# Patient Record
Sex: Male | Born: 1935 | Race: White | Hispanic: No | Marital: Married | Smoking: Former smoker
Health system: Southern US, Community
[De-identification: ages and names within clinical notes are randomized; demographics above are authoritative.]

## PROBLEM LIST (undated history)

## (undated) DIAGNOSIS — I10 Essential (primary) hypertension: Secondary | ICD-10-CM

## (undated) DIAGNOSIS — R05 Cough: Secondary | ICD-10-CM

## (undated) DIAGNOSIS — C7951 Secondary malignant neoplasm of bone: Secondary | ICD-10-CM

## (undated) DIAGNOSIS — R0602 Shortness of breath: Secondary | ICD-10-CM

## (undated) DIAGNOSIS — I350 Nonrheumatic aortic (valve) stenosis: Secondary | ICD-10-CM

## (undated) DIAGNOSIS — E785 Hyperlipidemia, unspecified: Secondary | ICD-10-CM

## (undated) DIAGNOSIS — Z7969 Long term (current) use of other immunomodulators and immunosuppressants: Secondary | ICD-10-CM

## (undated) DIAGNOSIS — I639 Cerebral infarction, unspecified: Secondary | ICD-10-CM

## (undated) DIAGNOSIS — Z789 Other specified health status: Secondary | ICD-10-CM

## (undated) DIAGNOSIS — Z79899 Other long term (current) drug therapy: Secondary | ICD-10-CM

## (undated) DIAGNOSIS — K649 Unspecified hemorrhoids: Secondary | ICD-10-CM

## (undated) DIAGNOSIS — J449 Chronic obstructive pulmonary disease, unspecified: Secondary | ICD-10-CM

## (undated) DIAGNOSIS — Z720 Tobacco use: Secondary | ICD-10-CM

## (undated) DIAGNOSIS — R059 Cough, unspecified: Secondary | ICD-10-CM

## (undated) DIAGNOSIS — Z9181 History of falling: Secondary | ICD-10-CM

## (undated) DIAGNOSIS — Z9221 Personal history of antineoplastic chemotherapy: Secondary | ICD-10-CM

## (undated) DIAGNOSIS — C3490 Malignant neoplasm of unspecified part of unspecified bronchus or lung: Principal | ICD-10-CM

## (undated) DIAGNOSIS — H348192 Central retinal vein occlusion, unspecified eye, stable: Secondary | ICD-10-CM

## (undated) DIAGNOSIS — H409 Unspecified glaucoma: Secondary | ICD-10-CM

## (undated) DIAGNOSIS — K2970 Gastritis, unspecified, without bleeding: Secondary | ICD-10-CM

## (undated) DIAGNOSIS — Z4682 Encounter for fitting and adjustment of non-vascular catheter: Secondary | ICD-10-CM

## (undated) DIAGNOSIS — Z923 Personal history of irradiation: Secondary | ICD-10-CM

## (undated) DIAGNOSIS — J939 Pneumothorax, unspecified: Secondary | ICD-10-CM

## (undated) DIAGNOSIS — Z9849 Cataract extraction status, unspecified eye: Secondary | ICD-10-CM

## (undated) DIAGNOSIS — K219 Gastro-esophageal reflux disease without esophagitis: Secondary | ICD-10-CM

## (undated) DIAGNOSIS — S2231XA Fracture of one rib, right side, initial encounter for closed fracture: Secondary | ICD-10-CM

## (undated) DIAGNOSIS — R918 Other nonspecific abnormal finding of lung field: Secondary | ICD-10-CM

## (undated) DIAGNOSIS — K759 Inflammatory liver disease, unspecified: Secondary | ICD-10-CM

## (undated) DIAGNOSIS — R531 Weakness: Secondary | ICD-10-CM

## (undated) DIAGNOSIS — J40 Bronchitis, not specified as acute or chronic: Secondary | ICD-10-CM

## (undated) HISTORY — DX: Cataract extraction status, unspecified eye: Z98.49

## (undated) HISTORY — DX: Gastritis, unspecified, without bleeding: K29.70

## (undated) HISTORY — DX: Essential (primary) hypertension: I10

## (undated) HISTORY — PX: TONSILLECTOMY: SUR1361

## (undated) HISTORY — DX: Nonrheumatic aortic (valve) stenosis: I35.0

## (undated) HISTORY — DX: Tobacco use: Z72.0

## (undated) HISTORY — DX: Hyperlipidemia, unspecified: E78.5

## (undated) HISTORY — DX: Bronchitis, not specified as acute or chronic: J40

## (undated) HISTORY — PX: EYE SURGERY: SHX253

## (undated) HISTORY — DX: Chronic obstructive pulmonary disease, unspecified: J44.9

## (undated) HISTORY — DX: Central retinal vein occlusion, unspecified eye, stable: H34.8192

## (undated) HISTORY — PX: VASECTOMY: SHX75

## (undated) HISTORY — DX: Malignant neoplasm of unspecified part of unspecified bronchus or lung: C34.90

## (undated) HISTORY — DX: Cerebral infarction, unspecified: I63.9

## (undated) HISTORY — DX: Inflammatory liver disease, unspecified: K75.9

## (undated) HISTORY — PX: TONSILLECTOMY: SHX5217

## (undated) HISTORY — PX: OTHER SURGICAL HISTORY: SHX169

## (undated) HISTORY — PX: LEG SURGERY: SHX1003

---

## 1988-03-09 DIAGNOSIS — H348192 Central retinal vein occlusion, unspecified eye, stable: Secondary | ICD-10-CM

## 1988-03-09 HISTORY — DX: Central retinal vein occlusion, unspecified eye, stable: H34.8192

## 2000-03-09 DIAGNOSIS — I639 Cerebral infarction, unspecified: Secondary | ICD-10-CM

## 2000-03-09 HISTORY — DX: Cerebral infarction, unspecified: I63.9

## 2001-09-15 ENCOUNTER — Encounter: Payer: Self-pay | Admitting: Plastic Surgery

## 2001-09-15 ENCOUNTER — Encounter: Admission: RE | Admit: 2001-09-15 | Discharge: 2001-09-15 | Payer: Self-pay | Admitting: Plastic Surgery

## 2001-09-16 ENCOUNTER — Ambulatory Visit (HOSPITAL_BASED_OUTPATIENT_CLINIC_OR_DEPARTMENT_OTHER): Admission: RE | Admit: 2001-09-16 | Discharge: 2001-09-16 | Payer: Self-pay | Admitting: Plastic Surgery

## 2004-11-18 ENCOUNTER — Encounter: Admission: RE | Admit: 2004-11-18 | Discharge: 2004-11-18 | Payer: Self-pay | Admitting: Endocrinology

## 2008-06-15 ENCOUNTER — Encounter: Admission: RE | Admit: 2008-06-15 | Discharge: 2008-06-15 | Payer: Self-pay | Admitting: Endocrinology

## 2009-05-06 ENCOUNTER — Encounter: Admission: RE | Admit: 2009-05-06 | Discharge: 2009-05-06 | Payer: Self-pay | Admitting: Endocrinology

## 2010-07-25 NOTE — Op Note (Signed)
[place]. [facility]  Patient:    Joel Gibson, Joel Gibson Visit Number: [id number] MRN: [id number]          Service Type: DSU Location: [facility] Attending Physician:  Loura Halt Ii Dictated by:   Alfredia Ferguson, M.D. Proc. Date: 09/16/01 Admit Date:  09/16/2001 Discharge Date: 09/16/2001                             Operative Report  PREOPERATIVE DIAGNOSIS: 1. Bilateral upper eyelid dermatochalasis. 2. Bilateral eyebrow ptosis. 3. Visual field deficit secondary to #1 and 2.  POSTOPERATIVE DIAGNOSIS: 1. Bilateral upper eyelid dermatochalasis. 2. Bilateral eyebrow ptosis. 3. Visual field deficit secondary to #1 and 2.  OPERATION PERFORMED: 1. Bilateral midforehead brow lift. 2. Bilateral upper eyelid blepharoplasty.  SURGEON:  Alfredia Ferguson, M.D.  ANESTHESIA:  MAC supplemented with 1% Xylocaine 1:100,000 epinephrine.  INDICATIONS FOR PROCEDURE:  The patient is a 77 year old gentleman who has a marked visual field deficit secondary to bilateral upper eyelid dermatochalasis and brow ptosis.  The patient wishes to undergo upper eyelid blepharoplasty and elevation of the eyebrow.  The plan is to elevate his eyebrow through a midforehead browlift.  The potential risks of overelevation of the eyebrow, underelevation of the eyebrow, asymmetry of the position of the eyebrow, infection, bleeding, unsightly scarring, numbness of the forehead and overall dissatisfaction with the results were discussed.  The potential risks of upper eyelid blepharoplasty include blindness, overresection of skin, underresection of skin, asymmetry, unsightly scarring, the need for secondary surgery, the need to return to the operating room for bleeding problems and overall dissatisfaction with the results.  Despite these and other risks discussed with the patient, he wished to proceed with the planned procedure.  DESCRIPTION OF PROCEDURE:  With the patient in the sitting  position, skin markers were placed outlining elliptical excision of forehead skin above each eyebrow in approximately the midforehead area.  The excision will be through a fairly deep forehead rhytid.  Skin markers were placed outlining the dimensions of an elliptical excision of skin from both upper eyelids.  The position of the incision in the eyes was 6 mm above the lid margin.  The patient was taken to the operating room where he was given adequate IV sedation.  Local anesthesia was infiltrated first in the eyebrow area and then in the upper eyelid area.  The face was prepped and draped in a sterile fashion.  After waiting approximately 10 minutes, attention was first directed to the right midforehead area.  An elliptical excision of skin approximately 5 cm in length and approximately 1 cm in width was carried out.  The depth of the excision of the skin was down to the frontalis muscle.  Three stab incisions were then made just at the hairline of the eyebrow on the right. One was at the medial brow, one was in the middle brow and one was at the lateral brow.  After achieving hemostasis, a 4-0 Prolene suture was placed in the upper skin incision medially in the subcutaneous space.  The suture was then tunneled beneath the skin and brought out through the stab incision in the medial incision of the right brow.  Once the needle had been brought out, it was then placed back in the stab incision and a bite of subcutaneous tissue was taken and the needle was passed back underneath the skin up to the eyebrow incision.  This was repeated  for the middle and lateral incision.  After irrigating the wound and ensuring hemostasis, these three Prolene sutures were now tied, suspending the eyebrow in the desired height.  Once these sutures were tied, the dermis of the incision was closed with multiple interrupted 4-0 Vicryl sutures.  Steri-Strips were applied to the skin edges.  Attention was directed  to the left midforehead wound.  Identical procedure was performed. Upon completion of the left side, symmetry appeared to be acceptable. Attention was now directed to the right upper eyelid where an elliptical excision of skin and muscle was carried out.  The skin was passed off.  The septum orbitale was opened medially and a small amount of fat was removed from the medial pocket.  Hemostasis was meticulously carried.  The incision was irrigated and inspected for hemostasis and once assured, a cold pack was placed over the open wound and attention was directed to the left upper eyelid where an identical procedure was performed.  Prior to excising the skin on both sides, the skin was tested to ensure that the correct amount of skin was being removed and not overresected.  A cold sponge was placed over the left incision and attention was redirected to the right upper eyelid.  Once hemostasis had been assured, the incision was closed using a running 6-0 nylon suture.  Closure on the left side was in identical fashion.  Symmetry appeared to be good.  The patient did not have any lagophthalmus at the completion of the procedure.  Lacrilube was placed in the eyes for lubrication.  The patients face was cleansed and dried.  The patient was taken to the recovery room in satisfactory condition.  Estimated blood loss for this procedure was less than 5 cc. Dictated by:   Alfredia Ferguson, M.D. Attending Physician:  Loura Halt Ii DD:  09/16/01 TD:  09/19/01 Job: [id number]

## 2010-09-03 ENCOUNTER — Encounter: Payer: Self-pay | Admitting: Cardiology

## 2010-09-11 ENCOUNTER — Encounter: Payer: Self-pay | Admitting: Cardiology

## 2010-09-11 ENCOUNTER — Ambulatory Visit (INDEPENDENT_AMBULATORY_CARE_PROVIDER_SITE_OTHER): Payer: Medicare Other | Admitting: Cardiology

## 2010-09-11 VITALS — BP 138/82 | HR 70 | Ht 67.0 in | Wt 151.0 lb

## 2010-09-11 DIAGNOSIS — I10 Essential (primary) hypertension: Secondary | ICD-10-CM

## 2010-09-11 DIAGNOSIS — I639 Cerebral infarction, unspecified: Secondary | ICD-10-CM

## 2010-09-11 DIAGNOSIS — I350 Nonrheumatic aortic (valve) stenosis: Secondary | ICD-10-CM | POA: Insufficient documentation

## 2010-09-11 DIAGNOSIS — Z720 Tobacco use: Secondary | ICD-10-CM | POA: Insufficient documentation

## 2010-09-11 DIAGNOSIS — F17200 Nicotine dependence, unspecified, uncomplicated: Secondary | ICD-10-CM

## 2010-09-11 DIAGNOSIS — I6350 Cerebral infarction due to unspecified occlusion or stenosis of unspecified cerebral artery: Secondary | ICD-10-CM

## 2010-09-11 DIAGNOSIS — I359 Nonrheumatic aortic valve disorder, unspecified: Secondary | ICD-10-CM

## 2010-09-11 DIAGNOSIS — E785 Hyperlipidemia, unspecified: Secondary | ICD-10-CM

## 2010-09-11 NOTE — Assessment & Plan Note (Signed)
Lipids are under excellent control on combination of Crestor and niacin.

## 2010-09-11 NOTE — Assessment & Plan Note (Addendum)
His last echocardiogram was in May of 2010. It showed mild to moderate aortic stenosis with a mean gradient of 12 mmHg and a valve area of 1.6 cm2. He is still asymptomatic. His exam is unchanged. We will plan on follow up again in one year and plan on repeating an echocardiogram at that time.

## 2010-09-11 NOTE — Assessment & Plan Note (Signed)
We discussed smoking cessation strategies. He has tried Chantix in the past with good success but doesn't feel that he wants to try this again. He is going to try quitting cold [place].

## 2010-09-11 NOTE — Assessment & Plan Note (Signed)
Blood pressure is well controlled on amlodipine. 

## 2010-09-11 NOTE — Progress Notes (Signed)
[name] Date of Birth: 10-05-1931   History of Present Illness: Mr. Salguero is seen for yearly followup. He has a history of mild to moderate aortic stenosis. He states he has done very well this year. He has resumed smoking one third pack per day. He does note that the heat of the summer has made his breathing worse. He denies any cough, edema, or orthopnea. He denies any chest pain or dizziness. He does complain of some cramps in his legs at night at times. He has had no recurrent TIA or CVA symptoms.  Current Outpatient Prescriptions on File Prior to Visit  Medication Sig Dispense Refill  . amLODipine (NORVASC) 10 MG tablet Take 10 mg by mouth daily.        . Aspirin-Dipyridamole (AGGRENOX PO) Take by mouth 2 (two) times daily.        . betaxolol (BETOPTIC-S) 0.25 % ophthalmic suspension Place 1 drop into both eyes daily.        . Multiple Vitamin (MULTIVITAMIN PO) Take 1 tablet by mouth daily.        . niacin (NIASPAN) 500 MG CR tablet Take 500 mg by mouth at bedtime.       . Ranitidine HCl ([employer]S RANITIDINE PO) Take 150 mg by mouth daily.       . rosuvastatin (CRESTOR) 40 MG tablet Take 20 mg by mouth daily.       Marland Kitchen tiotropium (SPIRIVA) 18 MCG inhalation capsule Place 18 mcg into inhaler and inhale daily.        Marland Kitchen DISCONTD: aspirin 325 MG tablet Take 325 mg by mouth daily.          No Known Allergies  Past Medical History  Diagnosis Date  . Hyperlipidemia   . Hypertension   . Aortic stenosis     mild to moderate  . Stroke   . Gastritis   . Retinal vein occlusion 1990  . COPD (chronic obstructive pulmonary disease)   . Bronchitis   . Hepatitis     possible  . Hx of cataract surgery     lt eye    Past Surgical History  Procedure Date  . Vasectomy   . Tonsillectomy   . Leg surgery     rt for nerve impingement    History  Smoking status  . Current Some Day Smoker -- 0.3 packs/day  . Types: Cigarettes  . Last Attempt to Quit: 07/06/2009  Smokeless tobacco  .  Not on file    History  Alcohol Use  . Yes    occassional    Family History  Problem Relation Age of Onset  . Heart attack Father     x7  . Cancer Father     throat  . Leukemia Mother     Review of Systems: The review of systems is positive for leg cramps at night.  This includes pain in his thigh and shin.All other systems were reviewed and are negative.  Physical Exam: BP 138/82  Pulse 70  Ht 5\' 7"  (1.702 m)  Wt 151 lb (68.493 kg)  BMI 23.65 kg/m2 He is a pleasant elderly male in no acute distress. He wears glasses. He is normocephalic, atraumatic. Pupils are equal round and reactive to light and accommodation. Sclera clear. Oropharynx is clear. Neck supple no JVD, adenopathy, thyromegaly, or bruits. Lungs revealed scattered wheezes and rhonchi. Cardiac exam reveals a grade 2/6 systolic ejection murmur heard at the right upper sternal border. This radiates into  the carotids and to the apex. Carotid upstrokes are normal. Abdomen is soft and nontender without mass or bruits. Femoral and pedal pulses are 2+ and symmetric. He has no edema. Skin is warm and dry. He is alert and oriented x3. Cranial nerves II through XII are intact. LABORATORY DATA: ECG today demonstrates normal sinus rhythm with a first degree AV block. It is otherwise normal. Blood work from June fourth 2012 demonstrate a normal chemistry panel. Total cholesterol is 109, triglycerides 88, HDL 38, LDL 53.  Assessment / Plan:

## 2010-09-11 NOTE — Patient Instructions (Signed)
You need to quit smoking.  We will see you again in 1 year and plan on getting an Echocardiogram then.

## 2010-09-12 ENCOUNTER — Encounter: Payer: Self-pay | Admitting: Cardiology

## 2011-05-21 ENCOUNTER — Telehealth: Payer: Self-pay | Admitting: Cardiology

## 2011-05-21 ENCOUNTER — Encounter: Payer: Self-pay | Admitting: Cardiology

## 2011-05-21 NOTE — Telephone Encounter (Signed)
PT WAS FOUND TO HAVE PROTEIN IN HIS URINE, DR ALVA'S OFFICE CHANGED HIS MED, IRBESARTAN TO TWO A DAY, WANTS TO [place] TO KNOW AND IF/WHEN HE NEED TO FOLLOW UP WITH [place]

## 2011-05-21 NOTE — Telephone Encounter (Signed)
Patient's wife called was told ok with Dr.Jordan to take irbesartan 150 mg 2 tablets daily.Advised to keep appointment 7/13.

## 2011-06-15 ENCOUNTER — Encounter: Payer: Self-pay | Admitting: Cardiology

## 2011-10-02 ENCOUNTER — Ambulatory Visit (INDEPENDENT_AMBULATORY_CARE_PROVIDER_SITE_OTHER): Payer: Medicare Other | Admitting: Cardiology

## 2011-10-02 ENCOUNTER — Encounter: Payer: Self-pay | Admitting: Cardiology

## 2011-10-02 VITALS — BP 119/65 | HR 70 | Ht 67.0 in | Wt 155.0 lb

## 2011-10-02 DIAGNOSIS — Z720 Tobacco use: Secondary | ICD-10-CM

## 2011-10-02 DIAGNOSIS — I359 Nonrheumatic aortic valve disorder, unspecified: Secondary | ICD-10-CM

## 2011-10-02 DIAGNOSIS — I10 Essential (primary) hypertension: Secondary | ICD-10-CM

## 2011-10-02 DIAGNOSIS — I350 Nonrheumatic aortic (valve) stenosis: Secondary | ICD-10-CM

## 2011-10-02 DIAGNOSIS — F17200 Nicotine dependence, unspecified, uncomplicated: Secondary | ICD-10-CM

## 2011-10-02 NOTE — Patient Instructions (Addendum)
We will schedule you for an Echocardiogram  I will see you again in 1 year

## 2011-10-04 NOTE — Assessment & Plan Note (Signed)
I have congratulated patient on his efforts at smoking cessation.

## 2011-10-04 NOTE — Assessment & Plan Note (Signed)
Blood pressure is well controlled on his current medications.

## 2011-10-04 NOTE — Progress Notes (Signed)
Joel Gibson Date of Birth: 1931/09/29   History of Present Illness: Joel Gibson is seen for yearly followup of his aortic stenosis. He has a history of mild to moderate aortic stenosis. He states he has done very well this year. He quit smoking on May 7. He denies any symptoms of shortness of breath or palpitations. His only chest discomfort is a very localized midsternal discomfort that is worse with movement of his chest or arms. He has no exertional symptoms.  Current Outpatient Prescriptions on File Prior to Visit  Medication Sig Dispense Refill  . amLODipine (NORVASC) 10 MG tablet Take 10 mg by mouth daily.        . Aspirin-Dipyridamole (AGGRENOX PO) Take by mouth 2 (two) times daily.        . betaxolol (BETOPTIC-S) 0.25 % ophthalmic suspension Place 1 drop into both eyes daily.        . irbesartan (AVAPRO) 300 MG tablet Take 300 mg by mouth daily.      . Multiple Vitamin (MULTIVITAMIN PO) Take 1 tablet by mouth daily.        . niacin (NIASPAN) 500 MG CR tablet Take 500 mg by mouth at bedtime.       . Ranitidine HCl (CVS RANITIDINE PO) Take 150 mg by mouth daily.       . rosuvastatin (CRESTOR) 40 MG tablet Take 20 mg by mouth daily.       Marland Kitchen tiotropium (SPIRIVA) 18 MCG inhalation capsule Place 18 mcg into inhaler and inhale daily.          No Known Allergies  Past Medical History  Diagnosis Date  . Hyperlipidemia   . Hypertension   . Aortic stenosis     mild to moderate  . Stroke   . Gastritis   . Retinal vein occlusion 1990  . COPD (chronic obstructive pulmonary disease)   . Bronchitis   . Hepatitis     possible  . Hx of cataract surgery     lt eye  . Tobacco abuse     Past Surgical History  Procedure Date  . Vasectomy   . Tonsillectomy   . Leg surgery     rt for nerve impingement    History  Smoking status  . Former Smoker  . Types: Cigarettes  . Quit date: 07/14/2011  Smokeless tobacco  . Not on file    History  Alcohol Use  . Yes    occassional      Family History  Problem Relation Age of Onset  . Heart attack Father     x7  . Cancer Father     throat  . Leukemia Mother     Review of Systems: The review of systems is positive for proteinuria.  This is being evaluated and treated by Dr. Caryn Section with nephrology. All other systems were reviewed and are negative.  Physical Exam: BP 119/65  Pulse 70  Ht 5\' 7"  (1.702 m)  Wt 155 lb (70.308 kg)  BMI 24.28 kg/m2 He is a pleasant elderly male in no acute distress. He wears glasses. He is normocephalic, atraumatic. Pupils are equal round and reactive to light and accommodation. Sclera clear. Oropharynx is clear. Neck supple no JVD, adenopathy, thyromegaly, or bruits. Lungs revealed scattered wheezes and rhonchi. Cardiac exam reveals a grade 2/6 systolic ejection murmur heard at the right upper sternal border. This radiates into the carotids and to the apex. Carotid upstrokes are normal. Abdomen is soft and nontender without  mass or bruits. Femoral and pedal pulses are 2+ and symmetric. He has no edema. Skin is warm and dry. He is alert and oriented x3. Cranial nerves II through XII are intact. LABORATORY DATA: ECG today demonstrates normal sinus rhythm with a normal ECG.   Assessment / Plan:

## 2011-10-04 NOTE — Assessment & Plan Note (Signed)
He has a history of mild to moderate aortic stenosis. His last echocardiogram in May of 2010 showed a mean gradient of 12 mmHg and valve area of 1.6 cm square. We will repeat an echocardiogram at this time to compare. Otherwise I'll followup again in one year.

## 2011-10-07 ENCOUNTER — Encounter: Payer: Self-pay | Admitting: Cardiology

## 2011-10-07 ENCOUNTER — Other Ambulatory Visit: Payer: Self-pay | Admitting: Internal Medicine

## 2011-10-07 DIAGNOSIS — I6350 Cerebral infarction due to unspecified occlusion or stenosis of unspecified cerebral artery: Secondary | ICD-10-CM

## 2011-10-08 ENCOUNTER — Other Ambulatory Visit (HOSPITAL_COMMUNITY): Payer: Medicare Other

## 2011-10-08 ENCOUNTER — Ambulatory Visit
Admission: RE | Admit: 2011-10-08 | Discharge: 2011-10-08 | Disposition: A | Discharge status: Home / Self Care | Payer: Medicare Other | Source: Ambulatory Visit | Attending: Internal Medicine | Admitting: Internal Medicine

## 2011-10-08 ENCOUNTER — Ambulatory Visit (HOSPITAL_COMMUNITY): Payer: Medicare Other | Attending: Cardiology | Admitting: Radiology

## 2011-10-08 DIAGNOSIS — I6350 Cerebral infarction due to unspecified occlusion or stenosis of unspecified cerebral artery: Secondary | ICD-10-CM

## 2011-10-08 DIAGNOSIS — Z87891 Personal history of nicotine dependence: Secondary | ICD-10-CM | POA: Insufficient documentation

## 2011-10-08 DIAGNOSIS — E785 Hyperlipidemia, unspecified: Secondary | ICD-10-CM | POA: Insufficient documentation

## 2011-10-08 DIAGNOSIS — I10 Essential (primary) hypertension: Secondary | ICD-10-CM | POA: Insufficient documentation

## 2011-10-08 DIAGNOSIS — Z8673 Personal history of transient ischemic attack (TIA), and cerebral infarction without residual deficits: Secondary | ICD-10-CM | POA: Insufficient documentation

## 2011-10-08 DIAGNOSIS — I350 Nonrheumatic aortic (valve) stenosis: Secondary | ICD-10-CM

## 2011-10-08 DIAGNOSIS — I359 Nonrheumatic aortic valve disorder, unspecified: Secondary | ICD-10-CM

## 2011-10-08 NOTE — Progress Notes (Signed)
Echocardiogram performed.  

## 2012-01-05 ENCOUNTER — Ambulatory Visit
Admission: RE | Admit: 2012-01-05 | Discharge: 2012-01-05 | Disposition: A | Discharge status: Home / Self Care | Payer: Medicare Other | Source: Ambulatory Visit | Attending: Internal Medicine | Admitting: Internal Medicine

## 2012-01-05 ENCOUNTER — Other Ambulatory Visit: Payer: Self-pay | Admitting: Internal Medicine

## 2012-01-05 DIAGNOSIS — R52 Pain, unspecified: Secondary | ICD-10-CM

## 2012-01-05 MED ORDER — IOHEXOL 350 MG/ML SOLN
125.0000 mL | Freq: Once | INTRAVENOUS | Status: AC | PRN
  Administered 2012-01-05: 125 mL via INTRAVENOUS

## 2012-01-12 DIAGNOSIS — R918 Other nonspecific abnormal finding of lung field: Secondary | ICD-10-CM | POA: Insufficient documentation

## 2012-01-13 ENCOUNTER — Encounter: Payer: Medicare Other | Admitting: Cardiothoracic Surgery

## 2012-01-14 ENCOUNTER — Encounter: Payer: Self-pay | Admitting: *Deleted

## 2012-01-15 ENCOUNTER — Encounter: Payer: Self-pay | Admitting: *Deleted

## 2012-01-15 ENCOUNTER — Other Ambulatory Visit: Payer: Self-pay | Admitting: Cardiothoracic Surgery

## 2012-01-15 ENCOUNTER — Institutional Professional Consult (permissible substitution) (INDEPENDENT_AMBULATORY_CARE_PROVIDER_SITE_OTHER): Payer: Medicare Other | Admitting: Cardiothoracic Surgery

## 2012-01-15 ENCOUNTER — Encounter: Payer: Self-pay | Admitting: Cardiothoracic Surgery

## 2012-01-15 VITALS — BP 138/69 | HR 87 | Resp 16 | Ht 67.0 in | Wt 150.0 lb

## 2012-01-15 DIAGNOSIS — R222 Localized swelling, mass and lump, trunk: Secondary | ICD-10-CM

## 2012-01-15 DIAGNOSIS — C7931 Secondary malignant neoplasm of brain: Secondary | ICD-10-CM

## 2012-01-15 DIAGNOSIS — D381 Neoplasm of uncertain behavior of trachea, bronchus and lung: Secondary | ICD-10-CM

## 2012-01-15 DIAGNOSIS — R918 Other nonspecific abnormal finding of lung field: Secondary | ICD-10-CM

## 2012-01-15 NOTE — Progress Notes (Signed)
PCP is [name], MD Referring Provider is [name], [name], MD  Chief Complaint  Patient presents with  . Lung Mass    RUL per [facility] 01/04/12...eval and treat    HPI: 78 year old male smoker presents with recent diagnosis of a 3 cm right upper lobe lobular mass suspicious for bronchogenic carcinoma as well as a pathologic fracture of the left seventh rib with a lytic lesion seen on CT scan. The patient paralytic had a chest x-ray performed last summer at primary care office showing slight abnormality of the right lung. Is asymptomatic. Since that time the patient fell in August on his right side and probably sustained a nondisplaced right eighth rib fracture which showed up on his recent  CT scan of the chest. He has lost 10 pounds in weight over the past 3 months. He denies productive cough headache back pain abdominal pain or night sweats or edema.  He presented to his primary care office with severe left-sided chest pain about a week ago and a CT scan of the chest demonstrated the right lung mass as well as the lytic lesion in the left seventh rib. No pleural effusion was noted. No abnormal mediastinal adenopathy was noted. The patient continues to smoke. There's no family history of lung cancer but positive family history of head and neck cancer in his father and leukemia in his mother  Spirometry in the office demonstrate FEV1 of 0.88 with FVC of 2.6   Past Medical History  Diagnosis Date  . Hyperlipidemia   . Hypertension   . Aortic stenosis     mild to moderate  . Stroke   . Gastritis   . Retinal vein occlusion 1990  . COPD (chronic obstructive pulmonary disease)   . Bronchitis   . Hepatitis     possible  . Hx of cataract surgery     lt eye  . Tobacco abuse     Past Surgical History  Procedure Date  . Vasectomy   . Tonsillectomy   . Leg surgery     rt for nerve impingement  . Collarbone fracture     1977    Family History  Problem Relation Age of Onset   . Heart attack Father     x7  . Cancer Father     throat  . Heart disease Father   . Leukemia Mother     Social History History  Substance Use Topics  . Smoking status: Current Every Day Smoker -- 6.0 packs/day for 60 years    Types: Cigarettes    Last Attempt to Quit: 07/14/2011  . Smokeless tobacco: Never Used  . Alcohol Use: Yes     Comment: occassional    Current Outpatient Prescriptions  Medication Sig Dispense Refill  . albuterol (PROVENTIL HFA;VENTOLIN HFA) 108 (90 BASE) MCG/ACT inhaler Inhale 2 puffs into the lungs every 4 (four) hours as needed.      Marland Kitchen amLODipine (NORVASC) 10 MG tablet Take 10 mg by mouth daily.        Marland Kitchen aspirin 81 MG tablet Take 81 mg by mouth daily.      . Aspirin-Dipyridamole (AGGRENOX PO) Take 25-200 mg by mouth 2 (two) times daily. Takes 2 tabs one day alt. with 1 tab next day,etc.      . betaxolol (BETOPTIC-S) 0.25 % ophthalmic suspension Place 1 drop into both eyes daily. One drop in the left eye in the am....one drop in the right eye in the am and in the pm.      .  HYDROcodone-acetaminophen (VICODIN) 5-500 MG per tablet Take 1 tablet by mouth every 4 (four) hours as needed.      Marland Kitchen HYDROcodone-homatropine (HYCODAN) 5-1.5 MG/5ML syrup Take 5 mLs by mouth every 6 (six) hours as needed.      . irbesartan (AVAPRO) 300 MG tablet Take 300 mg by mouth daily.      . Multiple Vitamin (MULTIVITAMIN PO) Take 1 tablet by mouth daily.        . niacin (NIASPAN) 500 MG CR tablet Take 500 mg by mouth at bedtime.       . Ranitidine HCl (CVS RANITIDINE PO) Take 150 mg by mouth daily.       . rosuvastatin (CRESTOR) 40 MG tablet Take 20 mg by mouth daily.       Marland Kitchen tiotropium (SPIRIVA) 18 MCG inhalation capsule Place 18 mcg into inhaler and inhale daily.          No Known Allergies  Review of Systems Constitutional 10 pound weight loss HEENT he sustained a TIA in the right eye in the past Thorax right upper lobe mass, left seventh rib lytic lesion with pain  moderate COPD with dyspnea on exertion Cardiac history of mild to moderate aortic stenosis valve area 1.1 followed by Dr. Deva Ron [place] peak gradient by echo 25 mm mercury GI no blood per rectum or jaundice or hepatitis positive history gastritis colonoscopy 2010 Endocrine negative for diabetes Vascular no DVT or claudication Neurologic brain MRI 2010 showed a lacunar infarct small vessel disease Renal baseline creatinine 1.5 was evaluated by Dr. Marina Gravel for possible myeloma but at this point it does not appear that is an established diagnosis BP 138/69  Pulse 87  Resp 16  Ht 5\' 7"  (1.702 m)  Wt 150 lb (68.04 kg)  BMI 23.49 kg/m2  SpO2 92% Physical Exam Gen. elderly fairly healthy-appearing male no acute distress HEENT normocephalic pupils equal  Neck no JVD mass or bruit Thorax no deformity positive tenderness left lateral seventh rib scattered rhonchi to auscultation Cardiac regular rhythm 2/6 systolic ejection murmur Abdomen soft without pulsatile mass or tenderness Extremities minimal edema 1+ right pedal pulse 2+ left pedal pulse Neuro no focal motor deficit   Diagnostic Tests: CT scan of the chest reviewed showing 3 cm lobular right upper lobe mass cystoscopy bronchogenic carcinoma. A lytic lesion in the seventh left rib consistent with a bone metastases. There is no abnormal mediastinal adenopathy noted or other pulmonary nodules.  Impression: Probable advanced stage bronchogenic carcinoma right upper lobe with left rib that. We'll proceed with PET scan and brain MRI to discuss biopsy  Plan:Return after PET scan and brain MRI. We'll probably obtain a diagnosis from the right upper lobe primary mass.

## 2012-01-15 NOTE — Patient Instructions (Signed)
Stop smoking U. will be scheduled for a PET scan and brain MRI

## 2012-01-20 ENCOUNTER — Ambulatory Visit (HOSPITAL_COMMUNITY)
Admission: RE | Admit: 2012-01-20 | Discharge: 2012-01-20 | Disposition: A | Discharge status: Home / Self Care | Payer: Medicare Other | Source: Ambulatory Visit | Attending: Cardiothoracic Surgery | Admitting: Cardiothoracic Surgery

## 2012-01-20 ENCOUNTER — Encounter (HOSPITAL_COMMUNITY)
Admission: RE | Admit: 2012-01-20 | Discharge: 2012-01-20 | Disposition: A | Discharge status: Home / Self Care | Payer: Medicare Other | Source: Ambulatory Visit | Attending: Cardiothoracic Surgery | Admitting: Cardiothoracic Surgery

## 2012-01-20 ENCOUNTER — Encounter (HOSPITAL_COMMUNITY): Payer: Self-pay

## 2012-01-20 DIAGNOSIS — C7951 Secondary malignant neoplasm of bone: Secondary | ICD-10-CM | POA: Insufficient documentation

## 2012-01-20 DIAGNOSIS — C7931 Secondary malignant neoplasm of brain: Secondary | ICD-10-CM

## 2012-01-20 DIAGNOSIS — C3490 Malignant neoplasm of unspecified part of unspecified bronchus or lung: Secondary | ICD-10-CM | POA: Insufficient documentation

## 2012-01-20 DIAGNOSIS — D381 Neoplasm of uncertain behavior of trachea, bronchus and lung: Secondary | ICD-10-CM

## 2012-01-20 DIAGNOSIS — C3410 Malignant neoplasm of upper lobe, unspecified bronchus or lung: Secondary | ICD-10-CM | POA: Insufficient documentation

## 2012-01-20 LAB — POCT I-STAT, CHEM 8
BUN: 20 mg/dL (ref 6–23)
Calcium, Ion: 1.14 mmol/L (ref 1.13–1.30)
Chloride: 104 mEq/L (ref 96–112)
Creatinine, Ser: 1.1 mg/dL (ref 0.50–1.35)
Glucose, Bld: 180 mg/dL — ABNORMAL HIGH (ref 70–99)
HCT: 44 % (ref 39.0–52.0)
Hemoglobin: 15 g/dL (ref 13.0–17.0)
Potassium: 3.8 mEq/L (ref 3.5–5.1)
Sodium: 142 mEq/L (ref 135–145)
TCO2: 25 mmol/L (ref 0–100)

## 2012-01-20 LAB — GLUCOSE, CAPILLARY: Glucose-Capillary: 96 mg/dL (ref 70–99)

## 2012-01-20 MED ORDER — FLUDEOXYGLUCOSE F - 18 (FDG) INJECTION
19.6000 | Freq: Once | INTRAVENOUS | Status: AC | PRN
  Administered 2012-01-20: 19.6 via INTRAVENOUS

## 2012-01-20 MED ORDER — GADOBENATE DIMEGLUMINE 529 MG/ML IV SOLN
14.0000 mL | Freq: Once | INTRAVENOUS | Status: AC | PRN
  Administered 2012-01-20: 14 mL via INTRAVENOUS

## 2012-01-21 ENCOUNTER — Ambulatory Visit (INDEPENDENT_AMBULATORY_CARE_PROVIDER_SITE_OTHER): Payer: Medicare Other | Admitting: Cardiothoracic Surgery

## 2012-01-21 ENCOUNTER — Other Ambulatory Visit: Payer: Self-pay | Admitting: Cardiothoracic Surgery

## 2012-01-21 ENCOUNTER — Encounter: Payer: Self-pay | Admitting: Cardiothoracic Surgery

## 2012-01-21 VITALS — BP 150/80 | HR 82 | Resp 20 | Ht 67.0 in | Wt 150.0 lb

## 2012-01-21 DIAGNOSIS — E889 Metabolic disorder, unspecified: Secondary | ICD-10-CM

## 2012-01-21 DIAGNOSIS — R222 Localized swelling, mass and lump, trunk: Secondary | ICD-10-CM

## 2012-01-21 DIAGNOSIS — R918 Other nonspecific abnormal finding of lung field: Secondary | ICD-10-CM

## 2012-01-21 NOTE — Progress Notes (Signed)
PCP is [name], MD Referring Provider is [name], [name], MD  Chief Complaint  Joel Gibson presents with  . Lung Mass    f/u from pet/mri, lung mass    HPI: Joel Gibson is a 78 year old smoker with a recently diagnosed large right upper lobe mass. He returns today for review of his PET scan and brain MRI. He is cut back on cigarette smoking.He continues to have pain over his left seventh rib area as well as his left hip. PET scan shows intense hypermetabolic activity of the primary mass in his right upper lobe with multiple skeletal hypermetabolic areas consistent with bone metastases in his left rib, left scapula, and several areas in his pelvis. No significant spinal-vertebral body involvement. PET scan images reviewed with Joel Gibson and his family.   it appears Joel Gibson has probable stage IV lung cancer. Results of PET scan reviewed with interventional radiology and plans made for a needle biopsy of the left iliac bone lesion.  Past Medical History  Diagnosis Date  . Hyperlipidemia   . Hypertension   . Aortic stenosis     mild to moderate  . Stroke   . Gastritis   . Retinal vein occlusion 1990  . COPD (chronic obstructive pulmonary disease)   . Bronchitis   . Hepatitis     possible  . Hx of cataract surgery     lt eye  . Tobacco abuse     Past Surgical History  Procedure Date  . Vasectomy   . Tonsillectomy   . Leg surgery     rt for nerve impingement  . Collarbone fracture     1977    Family History  Problem Relation Age of Onset  . Heart attack Father     x7  . Cancer Father     throat  . Heart disease Father   . Leukemia Mother     Social History History  Substance Use Topics  . Smoking status: Current Every Day Smoker -- 6.0 packs/day for 60 years    Types: Cigarettes    Last Attempt to Quit: 07/14/2011  . Smokeless tobacco: Never Used  . Alcohol Use: Yes     Comment: occassional    Current Outpatient Prescriptions  Medication Sig Dispense  Refill  . albuterol (PROVENTIL HFA;VENTOLIN HFA) 108 (90 BASE) MCG/ACT inhaler Inhale 2 puffs into the lungs every 4 (four) hours as needed.      Marland Kitchen amLODipine (NORVASC) 10 MG tablet Take 10 mg by mouth daily.        Marland Kitchen aspirin 81 MG tablet Take 81 mg by mouth daily.      . Aspirin-Dipyridamole (AGGRENOX PO) Take 25-200 mg by mouth 2 (two) times daily. Takes 2 tabs one day alt. with 1 tab next day,etc.      . betaxolol (BETOPTIC-S) 0.25 % ophthalmic suspension Place 1 drop into both eyes daily. One drop in the left eye in the am....one drop in the right eye in the am and in the pm.      . irbesartan (AVAPRO) 300 MG tablet Take 300 mg by mouth daily.      . Multiple Vitamin (MULTIVITAMIN PO) Take 1 tablet by mouth daily.        . niacin (NIASPAN) 500 MG CR tablet Take 500 mg by mouth at bedtime.       . Ranitidine HCl (CVS RANITIDINE PO) Take 150 mg by mouth daily.       . rosuvastatin (CRESTOR) 40 MG tablet Take  20 mg by mouth daily.       Marland Kitchen tiotropium (SPIRIVA) 18 MCG inhalation capsule Place 18 mcg into inhaler and inhale daily.        Marland Kitchen HYDROcodone-acetaminophen (VICODIN) 5-500 MG per tablet Take 1 tablet by mouth every 4 (four) hours as needed.      Marland Kitchen HYDROcodone-homatropine (HYCODAN) 5-1.5 MG/5ML syrup Take 5 mLs by mouth every 6 (six) hours as needed.        No Known Allergies  Review of SystemsNo fever no productive cough   BP 150/80  Pulse 82  Resp 20  Ht 5\' 7"  (1.702 m)  Wt 150 lb (68.04 kg)  BMI 23.49 kg/m2  SpO2 95% Physical Exam Alert and comfortable   neck without adenopathy or JVD Breath sounds with scattered rhonchi Cardiac regular rhythm Neuro intact  Diagnostic Tests: PET scan results noted as above   Impression: Probable stage IV lung cancer with multiple skeletal metastatic foci. We'll proceed with needle biopsy of the left iliac lesion.   Joel Gibson has been treated by Dr. Marina Gravel for a possible myelodysplastic disease which could also be related to his  multiple bone lesions-Will await pathology report  PlanReturn to office after needle biopsy of left iliac bone:

## 2012-01-22 ENCOUNTER — Other Ambulatory Visit: Payer: Self-pay | Admitting: Cardiothoracic Surgery

## 2012-01-22 DIAGNOSIS — M899 Disorder of bone, unspecified: Secondary | ICD-10-CM

## 2012-01-25 ENCOUNTER — Other Ambulatory Visit: Payer: Self-pay | Admitting: Physician Assistant

## 2012-01-26 ENCOUNTER — Ambulatory Visit (HOSPITAL_COMMUNITY)
Admission: RE | Admit: 2012-01-26 | Discharge: 2012-01-26 | Disposition: A | Discharge status: Home / Self Care | Payer: Medicare Other | Source: Ambulatory Visit | Attending: Cardiothoracic Surgery | Admitting: Cardiothoracic Surgery

## 2012-01-26 DIAGNOSIS — M899 Disorder of bone, unspecified: Secondary | ICD-10-CM | POA: Insufficient documentation

## 2012-01-26 DIAGNOSIS — C3490 Malignant neoplasm of unspecified part of unspecified bronchus or lung: Secondary | ICD-10-CM | POA: Insufficient documentation

## 2012-01-26 HISTORY — PX: OTHER SURGICAL HISTORY: SHX169

## 2012-01-26 LAB — CBC
HCT: 45.7 % (ref 39.0–52.0)
Hemoglobin: 15.6 g/dL (ref 13.0–17.0)
RDW: 12.3 % (ref 11.5–15.5)
WBC: 8.3 10*3/uL (ref 4.0–10.5)

## 2012-01-26 LAB — APTT: aPTT: 40 seconds — ABNORMAL HIGH (ref 24–37)

## 2012-01-26 LAB — PROTIME-INR
INR: 1.01 (ref 0.00–1.49)
Prothrombin Time: 13.2 seconds (ref 11.6–15.2)

## 2012-01-26 MED ORDER — FENTANYL CITRATE 0.05 MG/ML IJ SOLN
INTRAMUSCULAR | Status: AC
  Filled 2012-01-26: qty 4

## 2012-01-26 MED ORDER — SODIUM CHLORIDE 0.9 % IV SOLN
INTRAVENOUS | Status: DC
  Administered 2012-01-26: 10:00:00 via INTRAVENOUS

## 2012-01-26 MED ORDER — MIDAZOLAM HCL 2 MG/2ML IJ SOLN
INTRAMUSCULAR | Status: AC
  Filled 2012-01-26: qty 4

## 2012-01-26 MED ORDER — FENTANYL CITRATE 0.05 MG/ML IJ SOLN
INTRAMUSCULAR | Status: AC | PRN
  Administered 2012-01-26 (×2): 50 ug via INTRAVENOUS

## 2012-01-26 MED ORDER — MIDAZOLAM HCL 2 MG/2ML IJ SOLN
INTRAMUSCULAR | Status: AC | PRN
  Administered 2012-01-26 (×2): 1 mg via INTRAVENOUS

## 2012-01-26 NOTE — Procedures (Signed)
Technically successful CT guided Bx of left iliac bone lesion in pt with presumed metastatic lung cancer.  No immediate complications.

## 2012-01-26 NOTE — H&P (Signed)
Joel Gibson is an 78 y.o. male.   Chief Complaint: patient presents today for a needle core biopsy of his left iliac bone lesion.  HPI: see note below for same :  Joel Perna, MD Physician Signed  Progress Notes 01/21/2012 6:30 PM  Related encounter: Office Visit from 01/21/2012 in [facility] [place]  PCP is [name], MD Referring Provider is [name], Joel Royals, MD    Chief Complaint   Patient presents with   .  Lung Mass       f/u from pet/mri, lung mass     HPI: Patient is a 78 year old smoker with a recently diagnosed large right upper lobe mass. He returns today for review of his PET scan and brain MRI. He is cut back on cigarette smoking.He continues to have pain over his left seventh rib area as well as his left hip. PET scan shows intense hypermetabolic activity of the primary mass in his right upper lobe with multiple skeletal hypermetabolic areas consistent with bone metastases in his left rib, left scapula, and several areas in his pelvis. No significant spinal-vertebral body involvement. PET scan images reviewed with patient and his family.   it appears patient has probable stage IV lung cancer. Results of PET scan reviewed with interventional radiology and plans made for a needle biopsy of the left iliac bone lesion.    Past Medical History   Diagnosis  Date   .  Hyperlipidemia     .  Hypertension     .  Aortic stenosis         mild to moderate   .  Stroke     .  Gastritis     .  Retinal vein occlusion  1990   .  COPD (chronic obstructive pulmonary disease)     .  Bronchitis     .  Hepatitis         possible   .  Hx of cataract surgery         lt eye   .  Tobacco abuse         Past Surgical History   Procedure  Date   .  Vasectomy     .  Tonsillectomy     .  Leg surgery         rt for nerve impingement   .  Collarbone fracture         1977       Family History   Problem  Relation  Age of Onset   .  Heart  attack  Father         x7   .  Cancer  Father         throat   .  Heart disease  Father     .  Leukemia  Mother       Social History History   Substance Use Topics   .  Smoking status:  Current Every Day Smoker -- 6.0 packs/day for 60 years       Types:  Cigarettes       Last Attempt to Quit:  07/14/2011   .  Smokeless tobacco:  Never Used   .  Alcohol Use:  Yes         Comment: occassional       Current Outpatient Prescriptions   Medication  Sig  Dispense  Refill   .  albuterol (PROVENTIL HFA;VENTOLIN HFA) 108 (90 BASE) MCG/ACT inhaler  Inhale 2 puffs into the lungs every 4 (four) hours as needed.         Marland Kitchen  amLODipine (NORVASC) 10 MG tablet  Take 10 mg by mouth daily.           Marland Kitchen  aspirin 81 MG tablet  Take 81 mg by mouth daily.         .  Aspirin-Dipyridamole (AGGRENOX PO)  Take 25-200 mg by mouth 2 (two) times daily. Takes 2 tabs one day alt. with 1 tab next day,etc.         .  betaxolol (BETOPTIC-S) 0.25 % ophthalmic suspension  Place 1 drop into both eyes daily. One drop in the left eye in the am....one drop in the right eye in the am and in the pm.         .  irbesartan (AVAPRO) 300 MG tablet  Take 300 mg by mouth daily.         .  Multiple Vitamin (MULTIVITAMIN PO)  Take 1 tablet by mouth daily.           .  niacin (NIASPAN) 500 MG CR tablet  Take 500 mg by mouth at bedtime.          .  Ranitidine HCl ([employer] RANITIDINE PO)  Take 150 mg by mouth daily.          .  rosuvastatin (CRESTOR) 40 MG tablet  Take 20 mg by mouth daily.          Marland Kitchen  tiotropium (SPIRIVA) 18 MCG inhalation capsule  Place 18 mcg into inhaler and inhale daily.           Marland Kitchen  HYDROcodone-acetaminophen (VICODIN) 5-500 MG per tablet  Take 1 tablet by mouth every 4 (four) hours as needed.         Marland Kitchen  HYDROcodone-homatropine (HYCODAN) 5-1.5 MG/5ML syrup  Take 5 mLs by mouth every 6 (six) hours as needed.           No Known Allergies  Review of SystemsNo fever no productive cough   BP 150/80  Pulse 82  Resp 20   Ht 5\' 7"  (1.702 m)  Wt 150 lb (68.04 kg)  BMI 23.49 kg/m2  SpO2 95% Physical Exam Alert and comfortable    neck without adenopathy or JVD Breath sounds with scattered rhonchi Cardiac regular rhythm Neuro intact  Diagnostic Tests: PET scan results noted as above   Impression: Probable stage IV lung cancer with multiple skeletal metastatic foci. We'll proceed with needle biopsy of the left iliac lesion.    patient has been treated by Dr. Marina Gravel for a possible myelodysplastic disease which could also be related to his multiple bone lesions-Will await pathology report   PlanReturn to office after needle biopsy of left iliac bone:   Today's examination findings :   Past Medical History  Diagnosis Date  . Hyperlipidemia   . Hypertension   . Aortic stenosis     mild to moderate  . Stroke   . Gastritis   . Retinal vein occlusion 1990  . COPD (chronic obstructive pulmonary disease)   . Bronchitis   . Hepatitis     possible  . Hx of cataract surgery     lt eye  . Tobacco abuse     Past Surgical History  Procedure Date  . Vasectomy   . Tonsillectomy   . Leg surgery     rt for nerve impingement  . Collarbone fracture  1977    Family History  Problem Relation Age of Onset  . Heart attack Father     x7  . Cancer Father     throat  . Heart disease Father   . Leukemia Mother    Social History:  reports that he has been smoking Cigarettes.  He has a 360 pack-year smoking history. He has never used smokeless tobacco. He reports that he drinks alcohol. He reports that he does not use illicit drugs.  Allergies: No Known Allergies   Results for orders placed during the hospital encounter of 01/26/12 (from the past 48 hour(s))  APTT     Status: Abnormal   Collection Time   01/26/12 10:03 AM      Component Value Range Comment   aPTT 40 (*) 24 - 37 seconds   CBC     Status: Normal   Collection Time   01/26/12 10:03 AM      Component Value Range Comment   WBC  8.3  4.0 - 10.5 K/uL    RBC 4.96  4.22 - 5.81 MIL/uL    Hemoglobin 15.6  13.0 - 17.0 g/dL    HCT 16.1  09.6 - 04.5 %    MCV 92.1  78.0 - 100.0 fL    MCH 31.5  26.0 - 34.0 pg    MCHC 34.1  30.0 - 36.0 g/dL    RDW 40.9  81.1 - 91.4 %    Platelets 222  150 - 400 K/uL   PROTIME-INR     Status: Normal   Collection Time   01/26/12 10:03 AM      Component Value Range Comment   Prothrombin Time 13.2  11.6 - 15.2 seconds    INR 1.01  0.00 - 1.49     Review of Systems  Constitutional: Positive for weight loss and malaise/fatigue. Negative for fever and chills.  Eyes: Positive for blurred vision. Negative for pain.  Respiratory: Positive for shortness of breath. Negative for cough, hemoptysis, sputum production and wheezing.   Cardiovascular: Negative for chest pain, palpitations and leg swelling.       DOE  Gastrointestinal: Positive for nausea. Negative for abdominal pain.  Musculoskeletal: Positive for back pain. Negative for myalgias and falls.  Neurological: Negative for speech change, focal weakness, seizures and loss of consciousness.  Endo/Heme/Allergies: Bruises/bleeds easily.  Psychiatric/Behavioral: Negative for depression and memory loss. The patient is not nervous/anxious.     Blood pressure 129/76, pulse 87, temperature 97.5 F (36.4 C), temperature source Oral, resp. rate 18, height 5\' 7"  (1.702 m), weight 150 lb (68.04 kg), SpO2 97.00%. Physical Exam  Constitutional: He is oriented to person, place, and time. He appears well-developed and well-nourished. No distress.  HENT:  Head: Normocephalic and atraumatic.  Eyes: Pupils are equal, round, and reactive to light.  Neck: Normal range of motion.  Cardiovascular: Normal rate.  Exam reveals no gallop and no friction rub.   Murmur heard. Respiratory: Effort normal and breath sounds normal. No respiratory distress. He has no wheezes. He has no rales. He exhibits no tenderness.  GI: Soft. Bowel sounds are normal.    Musculoskeletal: Normal range of motion. He exhibits no edema.  Neurological: He is alert and oriented to person, place, and time.  Skin: Skin is warm and dry.       Multiple actinic lesions throughout extremities   Psychiatric: He has a normal mood and affect. His behavior is normal. Judgment and thought content normal.  Assessment/Plan Procedure details for needle core biopsy of his iliac lesion discussed in detail with patient and spouse.  Patient has been talked with by his doctors and is very aware of what is going on.  Potential complications including but not limited to infection, bleeding, inadequate sampling and complications with moderate sedation discussed in detail with all their questions answered to their satisfaction. Written consent obtained.   CAMPBELL,PAMELA D 01/26/2012, 10:59 AM

## 2012-01-26 NOTE — Progress Notes (Signed)
Discharged home via w/c; left iliac crest bandaid intact, site unremarkable, no c/o

## 2012-01-27 ENCOUNTER — Telehealth (HOSPITAL_COMMUNITY): Payer: Self-pay | Admitting: *Deleted

## 2012-01-27 ENCOUNTER — Encounter: Payer: Medicare Other | Admitting: Cardiothoracic Surgery

## 2012-01-27 NOTE — Telephone Encounter (Signed)
Radiology post iliac crest biopsy.  Per wife, pt doing great with no problems or questions.

## 2012-02-01 ENCOUNTER — Telehealth: Payer: Self-pay | Admitting: *Deleted

## 2012-02-01 NOTE — Telephone Encounter (Signed)
Spoke with pt's wife regarding appt time and place. She verbalized understanding of appt

## 2012-02-02 ENCOUNTER — Ambulatory Visit (HOSPITAL_BASED_OUTPATIENT_CLINIC_OR_DEPARTMENT_OTHER): Payer: Medicare Other | Admitting: Internal Medicine

## 2012-02-02 ENCOUNTER — Encounter: Payer: Self-pay | Admitting: Internal Medicine

## 2012-02-02 ENCOUNTER — Other Ambulatory Visit: Payer: Self-pay | Admitting: Medical Oncology

## 2012-02-02 ENCOUNTER — Other Ambulatory Visit: Payer: Medicare Other | Admitting: Lab

## 2012-02-02 ENCOUNTER — Telehealth: Payer: Self-pay | Admitting: Internal Medicine

## 2012-02-02 ENCOUNTER — Ambulatory Visit: Payer: Medicare Other

## 2012-02-02 VITALS — BP 152/75 | HR 93 | Temp 97.0°F | Resp 18 | Ht 67.0 in | Wt 154.3 lb

## 2012-02-02 DIAGNOSIS — Z87891 Personal history of nicotine dependence: Secondary | ICD-10-CM

## 2012-02-02 DIAGNOSIS — C7951 Secondary malignant neoplasm of bone: Secondary | ICD-10-CM

## 2012-02-02 DIAGNOSIS — C3490 Malignant neoplasm of unspecified part of unspecified bronchus or lung: Secondary | ICD-10-CM

## 2012-02-02 DIAGNOSIS — C3410 Malignant neoplasm of upper lobe, unspecified bronchus or lung: Secondary | ICD-10-CM

## 2012-02-02 DIAGNOSIS — I10 Essential (primary) hypertension: Secondary | ICD-10-CM

## 2012-02-02 HISTORY — DX: Malignant neoplasm of unspecified part of unspecified bronchus or lung: C34.90

## 2012-02-02 MED ORDER — PROCHLORPERAZINE MALEATE 10 MG PO TABS: 10.0000 mg | ORAL_TABLET | Freq: Four times a day (QID) | ORAL | Status: DC | PRN

## 2012-02-02 MED ORDER — LIDOCAINE-PRILOCAINE 2.5-2.5 % EX CREA: TOPICAL_CREAM | CUTANEOUS | Status: DC | PRN

## 2012-02-02 NOTE — Patient Instructions (Signed)
Your recently diagnosed with metastatic non-small cell lung cancer. We discussed several treatment options including systemic chemotherapy. First cycle of chemotherapy expected next week. Followup visit in 2 weeks

## 2012-02-02 NOTE — Progress Notes (Signed)
[facility] [phone]   [phone]  CONSULT NOTE  REASON FOR CONSULTATION:  78 years old white male diagnosed with lung cancer  HPI Joel Gibson is a 78 y.o. male was past medical history significant for hypertension, dyslipidemia, osteoarthritis, history of gastritis, mild stroke in 2002, COPD, glaucoma and questionable history of proteinuria followed by Dr. Caryn Section which is currently resolved. The patient also has long history of smoking but quit 3 weeks ago. The patient mentions that few weeks ago he fell in the bathtub and had a right rib fracture. He did note that they much attention to it but 3 weeks ago he started having pain on the left rib cage. He was seen by his primary care physician Dr. Felipa Eth. CT angiogram of the chest was performed on 01/05/2012. It showed 3.3 cm right upper lobe lobular mass in the setting of emphysema suggesting bronchogenic carcinoma. Pathologic fracture of the left seventh rib through a lytic  lesion suggesting osseous metastatic disease. No evidence of pulmonary emboli or thoracic aortic dissection. A PET scan was performed on 01/20/2012 and it showed hypermetabolic right upper lobe mass consistent with primary bronchogenic carcinoma. There was no evidence of mediastinal nodal metastasis. There was widespread skeletal metastasis with a lytic hypermetabolic lesion within the pelvis, ribs and scapula.  MRI of the brain on 01/21/2012 showed atrophy and chronic microvascular ischemia but no acute infarct or metastatic disease. The patient was seen by Dr. Donata Clay and CT guided core biopsy of the left iliac lytic lesion was performed by interventional radiology on 01/26/2012. The final pathology was consistent with metastatic squamous cell carcinoma. Immunohistochemical stains are performed. The tumor is positive for p63 and cytokeratin 7. There is focal positivity for cytokeratin 5/6. TTF-1, PSA, CDX2 and cytokeratin 20 are all  negative. The morphology coupled with the immunophenotype is consistent with the above diagnosis. Dr. Donata Clay kindly referred the patient to me today for evaluation and recommendation regarding treatment of his condition. When seen today the patient is feeling fine with no specific complaints except for very mild pain in the pelvic area as well as the left rib cage. He grade his pain as 4 on a scale from 1-10. He also continues to have mild cough which improved with Hycodan and shortness breath with exertion. He denied having any significant weight loss or night sweats. No significant headache or visual changes. The patient has a history of mild to moderate aortic stenosis and he is currently under the care of Dr. Peter [place] and followed by observation. He has no other history of malignancy or myelodysplastic disorder which was mistakenly mentioned on previous note. He has no significant history of myocardial infarction. His family history significant for her father with throat cancer and died from staph infection, mother had leukemia. The patient is married and has 3 children. He was accompanied today by his wife Joel Gibson, 2 daughters Joel Gibson and Joel Gibson. She also has a son named Joel Gibson. 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 He has a history of smoking 2 packs per day for around 65 years and quit 3 weeks ago. He drinks alcohol occasionally and no history of drug abuse.  @SFHPI @  Past Medical History  Diagnosis Date  . Hyperlipidemia   . Hypertension   . Aortic stenosis     mild to moderate  . Stroke   . Gastritis   . Retinal vein occlusion 1990  . COPD (  chronic obstructive pulmonary disease)   . Bronchitis   . Hepatitis     possible  . Hx of cataract surgery     lt eye  . Tobacco abuse   . Lung cancer 02/02/2012    Past Surgical History  Procedure Date  . Vasectomy   . Tonsillectomy   . Leg surgery     rt for nerve impingement  .  Collarbone fracture     1977    Family History  Problem Relation Age of Onset  . Heart attack Father     x7  . Cancer Father     throat  . Heart disease Father   . Leukemia Mother     Social History History  Substance Use Topics  . Smoking status: Current Every Day Smoker -- 6.0 packs/day for 60 years    Types: Cigarettes    Last Attempt to Quit: 07/14/2011  . Smokeless tobacco: Never Used  . Alcohol Use: Yes     Comment: occassional    No Known Allergies  Current Outpatient Prescriptions  Medication Sig Dispense Refill  . albuterol (PROVENTIL HFA;VENTOLIN HFA) 108 (90 BASE) MCG/ACT inhaler Inhale 2 puffs into the lungs every 4 (four) hours as needed.      Marland Kitchen amLODipine (NORVASC) 10 MG tablet Take 10 mg by mouth daily.        Marland Kitchen aspirin 81 MG tablet Take 81 mg by mouth daily.      . Aspirin-Dipyridamole (AGGRENOX PO) Take 25-200 mg by mouth 2 (two) times daily. Takes 2 tabs one day alt. with 1 tab next day,etc.      . betaxolol (BETOPTIC-S) 0.25 % ophthalmic suspension Place 1 drop into both eyes daily. One drop in the left eye in the am....one drop in the right eye in the am and in the pm.      . HYDROcodone-homatropine (HYCODAN) 5-1.5 MG/5ML syrup Take 5 mLs by mouth every 6 (six) hours as needed.      . irbesartan (AVAPRO) 300 MG tablet Take 300 mg by mouth daily.      . Multiple Vitamin (MULTIVITAMIN PO) Take 1 tablet by mouth daily.        . niacin (NIASPAN) 500 MG CR tablet Take 500 mg by mouth at bedtime.       . Ranitidine HCl ([employer] RANITIDINE PO) Take 150 mg by mouth daily.       . rosuvastatin (CRESTOR) 40 MG tablet Take 20 mg by mouth daily.       Marland Kitchen tiotropium (SPIRIVA) 18 MCG inhalation capsule Place 18 mcg into inhaler and inhale daily.        Marland Kitchen HYDROcodone-acetaminophen (VICODIN) 5-500 MG per tablet Take 1 tablet by mouth every 4 (four) hours as needed.        Review of Systems  A comprehensive review of systems was negative except for: Respiratory: positive  for dyspnea on exertion Musculoskeletal: positive for bone pain  Physical Exam  GEX:BMWUX, healthy, no distress, well nourished and well developed SKIN: skin color, texture, turgor are normal HEAD: Normocephalic EYES: normal, PERRLA EARS: External ears normal OROPHARYNX:no exudate and no erythema  NECK: supple, no adenopathy LYMPH:  no palpable lymphadenopathy, no hepatosplenomegaly LUNGS: clear to auscultation  HEART: regular rate & rhythm and no murmurs ABDOMEN:abdomen soft, non-tender, normal bowel sounds and no masses or organomegaly BACK: Back symmetric, no curvature. EXTREMITIES:no joint deformities, effusion, or inflammation, no edema, no skin discoloration, no clubbing  NEURO: alert & oriented x 3  with fluent speech, no focal motor/sensory deficits  PERFORMANCE STATUS: ECOG 1  LABORATORY DATA: Lab Results  Component Value Date   WBC 8.3 01/26/2012   HGB 15.6 01/26/2012   HCT 45.7 01/26/2012   MCV 92.1 01/26/2012   PLT 222 01/26/2012      Chemistry      Component Value Date/Time   NA 142 01/20/2012 1617   K 3.8 01/20/2012 1617   CL 104 01/20/2012 1617   BUN 20 01/20/2012 1617   CREATININE 1.10 01/20/2012 1617   No results found for this basename: CALCIUM, ALKPHOS, AST, ALT, BILITOT       RADIOGRAPHIC STUDIES: Ct Angio Chest Pe W/cm &/or Wo Cm  01/05/2012  *RADIOLOGY REPORT*  Clinical Data: Left chest pain, shortness of breath  CT ANGIOGRAPHY CHEST  Technique:  Multidetector CT imaging of the chest using the standard protocol during bolus administration of intravenous contrast. Multiplanar reconstructed images including MIPs were obtained and reviewed to evaluate the vascular anatomy.  Contrast: OMNIPAQUE IOHEXOL 350 MG/ML SOLN BUN and creatinine were obtained on site at [facility] at [address] Results:    Creatinine 1.1 mg/dL.  Comparison: None.  Findings: There is good contrast opacification of the pulmonary artery branches.  No  discrete filling defect to suggest acute PE.Adequate contrast opacification of the thoracic aorta with no evidence of dissection, aneurysm, or stenosis. There is classic 3- vessel brachiocephalic arch anatomy.  Coronary and thoracic aortic calcified plaque.  No pleural or pericardial effusion.  No hilar or mediastinal adenopathy.  3.3 cm lobular mass in the posteromedial right upper lobe, containing some gas lucency at its medial aspect.  Moderate emphysematous changes are evident most marked in the lung apices. No other nodule or airspace consolidation. Lytic lesion in the lateral aspect left seventh rib with a nondisplaced pathologic fracture.  Old right eighth rib fracture.  Small spurs in the mid and lower thoracic spine. Probable small cysts in the upper pole right kidney, incompletely evaluated.  Remainder visualized upper abdomen unremarkable.  IMPRESSION:  1.  3.3 cm right upper lobe lobular mass in the setting of emphysema suggesting bronchogenic carcinoma. 2.  Pathologic fracture of the left seventh rib through a lytic lesion suggesting osseous metastatic disease. 3.  No evidence of pulmonary emboli or thoracic aortic dissection. 4. Atherosclerosis, including thoracic aortic and coronary artery disease. Please note that although the presence of coronary artery calcium documents the presence of coronary artery disease, the severity of this disease and any potential stenosis cannot be assessed on this non-gated CT examination.  Assessment for potential risk factor modification, dietary therapy or pharmacologic therapy may be warranted, if clinically indicated.   Original Report Authenticated By: Osa Craver, M.D.    Mr Laqueta Jean WG Contrast  01/21/2012  *RADIOLOGY REPORT*  Clinical Data: New diagnosis lung cancer.  Rule out metastatic disease.  MRI HEAD WITHOUT AND WITH CONTRAST  Technique:  Multiplanar, multiecho pulse sequences of the brain and surrounding structures were obtained according to  standard protocol without and with intravenous contrast  Contrast: 14mL MULTIHANCE GADOBENATE DIMEGLUMINE 529 MG/ML IV SOLN  Comparison: MRI 06/15/2008  Findings: Nonenhancing white matter hyperintensities bilaterally have progressed since the prior study.  This is most compatible with chronic microvascular ischemia.  There also is mild chronic microvascular ischemia in the pons.  No acute infarct.  There is mild atrophy.  No mass or edema.  No enhancing lesions are seen following contrast administration.  Diffusion weighted  imaging is negative.  Small area of hemorrhage in the left frontal cortex over the convexity appears chronic and may be due to hypertension or trauma.  Mild mucosal thickening left maxillary sinus.  Remaining sinuses are clear.  IMPRESSION: Atrophy and chronic microvascular ischemia which has progressed since 2010.  No acute infarct.  Negative for metastatic disease to the brain.   Original Report Authenticated By: Janeece Riggers, M.D.    Nm Pet Image Initial (pi) Skull Base To Thigh  01/20/2012  *RADIOLOGY REPORT*  Clinical Data: Initial treatment strategy for lung mass.  NUCLEAR MEDICINE PET SKULL BASE TO THIGH  Fasting Blood Glucose:  96  Technique:  19.6 mCi F-18 FDG was injected intravenously. CT data was obtained and used for attenuation correction and anatomic localization only.  (This was not acquired as a diagnostic CT examination.) Additional exam technical data entered on technologist worksheet.  Comparison:  Chest CT 01/05/2012  Findings:  Neck: No hypermetabolic lymph nodes in the neck.  Chest:  There is an intensely hypermetabolic right upper lobe mass which measures 31 mm with SUV max = 15.3.  No additional suspicious nodules.  No hypermetabolic mediastinal lymph nodes.  There is a left lateral rib metastasis (image 96) with the soft tissue extension into the pleural space.  Abdomen/Pelvis:  No abnormal hypermetabolic activity within the liver, pancreas, adrenal glands, or  spleen.  No hypermetabolic lymph nodes in the abdomen or pelvis. There are bilateral renal cysts without hypermetabolic activity.  Skeleton:  There multiple intensely hypermetabolic lytic lesions in the pelvis, ribs, and left scapula.  Exemplary lesion in the right iliac bone erodes the cortex (image 186) and is intensely hypermetabolic with SUV max = 10.7.  Hypermetabolic lesions within the right sacra ala. Right iliac bone lesion noted.  There is lesion in the body of the scapula on the left (image 92) . Bilateral single rib lesions.  IMPRESSION:  1.  Hypermetabolic right upper lobe mass consistent with primary bronchogenic carcinoma.  2.  No evidence of mediastinal nodal metastasis. 3.  Widespread skeletal metastasis with a lytic hypermetabolic lesions within the pelvis, ribs, and scapula.   Original Report Authenticated By: Genevive Bi, M.D.    Ct Biopsy  01/26/2012  *RADIOLOGY REPORT*  Indication: Presumed metastatic lung cancer, now with hypermetabolic left iliac lytic lesion.  CT GUIDED CORE Gibson BIOPSY OF LEFT ILIAC LYTIC LESION  Comparisons: PET CT - 01/20/2012; chest CT - 01/05/2012  Medications: Fentanyl 100 mcg IV; Versed 2 mg IV  Contrast: None  Sedation time: 20 minutes  CT Fluoroscopy time: 4 seconds  Complications: None immediate  TECHNIQUE/FINDINGS:  Informed consent was obtained from the patient following an explanation of the procedure, risks, benefits and alternatives.  A time out was performed prior to the initiation of the procedure.  The patient was positioned prone, slightly LPO on the CT table and a limited CT was performed for procedural planning demonstrating the approximately 2.6 x 1.7 cm lytic lesion involving the left iliac wing (image 7, series 2).  The procedure was planned.  The operative site was prepped and draped in the usual sterile fashion. Appropriate trajectory was confirmed with a 22 gauge spinal Gibson after the adjacent tissues were anesthetized with 1% Lidocaine  with epinephrine.  Under intermittent CT guidance, a 17 gauge coaxial Gibson was advanced into the peripheral aspect of the lytic lesion. Positioning was confirmed and 5 core Gibson biopsies were obtained with an 18 gauge core Gibson biopsy device.  The co-axial Gibson  was removed and hemostasis was achieved with manual compression.  A limited post procedural CT was negative for hemorrhage or additional complication.  A dressing was placed.  The patient tolerated the procedure well without immediate postprocedural complication.  IMPRESSION:  Technically successful CT guided core Gibson biopsy of dominant hypermetabolic lytic lesion involving the posterior aspect of the left iliac wing.   Original Report Authenticated By: Tacey Ruiz, MD     ASSESSMENT: This is a very pleasant 78 years old white male recently diagnosed with a stage IV non-small cell lung cancer, squamous cell carcinoma with significant bone metastasis.  PLAN: I have a lengthy discussion with the patient and his family today about his disease stage, prognosis and treatment options. I gave the patient the option of systemic chemotherapy versus palliative care and hospice referral. The patient would like to consider treatment. I discussed with him several regimen for his treatment including treatment with chemotherapy in the form of carboplatin and paclitaxel versus treatment with carboplatin and weekly Abraxane versus enrollment in a clinical trial with treatment with carboplatin, paclitaxel plus/minus Necitumumab. I discussed the adverse effect of the chemotherapy with the patient including but not limited to alopecia, myelosuppression, nausea vomiting, peripheral neuropathy, liver or renal dysfunction. The patient is interested in the clinical trial and he would be seen by the clinical research nurse today for evaluation of his eligibility. If the patient is eligible for the clinical trial and he signed the consent we'll proceed with the  treatment was carboplatin, paclitaxel and Necitumumab, otherwise the patient would be interested on treatment with carboplatin and weekly Abraxane. I will arrange for the patient to have a chemotherapy education class before starting the first cycle of his chemotherapy. I expect the patient to start the first cycle of this treatment later next week. I would also referred the patient to Dr. Donata Clay for consideration of Port-A-Cath placement. I will call his pharmacy was Compazine 10 mg by mouth every 6 hours as needed for nausea in addition to Emla Cream to be applied to the Port-A-Cath site 30-60 minutes before chemotherapy. I would also consider the patient for treatment with Zometa or Xgeva after obtaining a dental clearance. I gave the patient and his family the time to ask questions and I answered them completely to their satisfaction. The patient would come back for followup visit in 2 weeks for evaluation and management any adverse effect of his chemotherapy. He was advised to call me immediately if he has any concerning symptoms in the interval.  All questions were answered. The patient knows to call the clinic with any problems, questions or concerns. We can certainly see the patient much sooner if necessary.  Thank you so much for allowing me to participate in the care of EZECHIEL STOOKSBURY. I will continue to follow up the patient with you and assist in his care.  I spent 30 minutes counseling the patient face to face. The total time spent in the appointment was 60 minutes.  Ayanni Tun K. 02/02/2012, 4:13 PM

## 2012-02-02 NOTE — Progress Notes (Signed)
Checked in new patient. No financial issues. °

## 2012-02-02 NOTE — Telephone Encounter (Signed)
appts made and printed for pt aom °

## 2012-02-03 ENCOUNTER — Other Ambulatory Visit: Payer: Medicare Other

## 2012-02-03 ENCOUNTER — Encounter (HOSPITAL_COMMUNITY): Payer: Self-pay | Admitting: Pharmacy Technician

## 2012-02-03 ENCOUNTER — Telehealth: Payer: Self-pay | Admitting: *Deleted

## 2012-02-03 ENCOUNTER — Other Ambulatory Visit: Payer: Self-pay | Admitting: *Deleted

## 2012-02-03 ENCOUNTER — Encounter: Payer: Self-pay | Admitting: *Deleted

## 2012-02-03 ENCOUNTER — Other Ambulatory Visit: Payer: Medicare Other | Admitting: Lab

## 2012-02-03 ENCOUNTER — Other Ambulatory Visit: Payer: Self-pay

## 2012-02-03 DIAGNOSIS — C3490 Malignant neoplasm of unspecified part of unspecified bronchus or lung: Secondary | ICD-10-CM

## 2012-02-03 NOTE — Telephone Encounter (Signed)
Patient checked in today for his chemo class appointment patient lab md treatment appointments have all ready been scheduled

## 2012-02-03 NOTE — Telephone Encounter (Signed)
Left message regarding appt for port placement on Monday.  Message stated Short Stay will call with time and instructions and I left my phone number if he had questions.

## 2012-02-05 ENCOUNTER — Telehealth: Payer: Self-pay | Admitting: *Deleted

## 2012-02-05 ENCOUNTER — Other Ambulatory Visit: Payer: Self-pay | Admitting: Internal Medicine

## 2012-02-05 ENCOUNTER — Telehealth: Payer: Self-pay | Admitting: Internal Medicine

## 2012-02-05 ENCOUNTER — Encounter (HOSPITAL_COMMUNITY): Payer: Self-pay | Admitting: *Deleted

## 2012-02-05 ENCOUNTER — Encounter: Payer: Self-pay | Admitting: *Deleted

## 2012-02-05 ENCOUNTER — Other Ambulatory Visit: Payer: Self-pay | Admitting: *Deleted

## 2012-02-05 NOTE — Telephone Encounter (Signed)
s/w wife and went ovwer appts,appt with [facility] on 12/16 due to aj appts avail were too late in the day

## 2012-02-05 NOTE — Telephone Encounter (Signed)
Pt's wife called requesting for appts to be switched to Mondays.  Onc tx schedule filled out.  Pts wife also stated that they have not heard any info regarding port placement.  Called the OR at [facility], they stated that someone will be in touch with them if not today then over the weekend with instructions.  SLJ

## 2012-02-07 MED ORDER — DEXTROSE 5 % IV SOLN
1.5000 g | INTRAVENOUS | Status: AC
  Administered 2012-02-08: 1.5 g via INTRAVENOUS
  Filled 2012-02-07: qty 1.5

## 2012-02-08 ENCOUNTER — Encounter (HOSPITAL_COMMUNITY): Payer: Self-pay | Admitting: *Deleted

## 2012-02-08 ENCOUNTER — Encounter (HOSPITAL_COMMUNITY): Payer: Self-pay | Admitting: Anesthesiology

## 2012-02-08 ENCOUNTER — Ambulatory Visit (HOSPITAL_COMMUNITY): Payer: Medicare Other

## 2012-02-08 ENCOUNTER — Encounter (HOSPITAL_COMMUNITY)
Admission: RE | Disposition: A | Discharge status: Home / Self Care | Payer: Self-pay | Source: Ambulatory Visit | Attending: Cardiothoracic Surgery

## 2012-02-08 ENCOUNTER — Ambulatory Visit (HOSPITAL_COMMUNITY)
Admission: RE | Admit: 2012-02-08 | Discharge: 2012-02-08 | Disposition: A | Discharge status: Home / Self Care | Payer: Medicare Other | Source: Ambulatory Visit | Attending: Cardiothoracic Surgery | Admitting: Cardiothoracic Surgery

## 2012-02-08 ENCOUNTER — Telehealth: Payer: Self-pay | Admitting: *Deleted

## 2012-02-08 ENCOUNTER — Ambulatory Visit (HOSPITAL_COMMUNITY): Payer: Medicare Other | Admitting: Anesthesiology

## 2012-02-08 DIAGNOSIS — Z87891 Personal history of nicotine dependence: Secondary | ICD-10-CM | POA: Insufficient documentation

## 2012-02-08 DIAGNOSIS — K219 Gastro-esophageal reflux disease without esophagitis: Secondary | ICD-10-CM | POA: Insufficient documentation

## 2012-02-08 DIAGNOSIS — Z8673 Personal history of transient ischemic attack (TIA), and cerebral infarction without residual deficits: Secondary | ICD-10-CM | POA: Insufficient documentation

## 2012-02-08 DIAGNOSIS — J449 Chronic obstructive pulmonary disease, unspecified: Secondary | ICD-10-CM | POA: Insufficient documentation

## 2012-02-08 DIAGNOSIS — C3490 Malignant neoplasm of unspecified part of unspecified bronchus or lung: Secondary | ICD-10-CM

## 2012-02-08 DIAGNOSIS — I10 Essential (primary) hypertension: Secondary | ICD-10-CM | POA: Insufficient documentation

## 2012-02-08 DIAGNOSIS — J4489 Other specified chronic obstructive pulmonary disease: Secondary | ICD-10-CM | POA: Insufficient documentation

## 2012-02-08 HISTORY — DX: Unspecified hemorrhoids: K64.9

## 2012-02-08 HISTORY — DX: Gastro-esophageal reflux disease without esophagitis: K21.9

## 2012-02-08 HISTORY — DX: Shortness of breath: R06.02

## 2012-02-08 HISTORY — PX: PORTACATH PLACEMENT: SHX2246

## 2012-02-08 LAB — CBC
HCT: 43.7 % (ref 39.0–52.0)
Hemoglobin: 15 g/dL (ref 13.0–17.0)
MCH: 31.6 pg (ref 26.0–34.0)
MCHC: 34.3 g/dL (ref 30.0–36.0)
MCV: 92.2 fL (ref 78.0–100.0)
Platelets: 199 10*3/uL (ref 150–400)
RBC: 4.74 MIL/uL (ref 4.22–5.81)
RDW: 12.1 % (ref 11.5–15.5)
WBC: 8.3 10*3/uL (ref 4.0–10.5)

## 2012-02-08 LAB — SURGICAL PCR SCREEN
MRSA, PCR: NEGATIVE
Staphylococcus aureus: NEGATIVE

## 2012-02-08 LAB — COMPREHENSIVE METABOLIC PANEL
ALT: 15 U/L (ref 0–53)
AST: 17 U/L (ref 0–37)
Albumin: 3.7 g/dL (ref 3.5–5.2)
Alkaline Phosphatase: 90 U/L (ref 39–117)
BUN: 20 mg/dL (ref 6–23)
CO2: 27 mEq/L (ref 19–32)
Calcium: 9.6 mg/dL (ref 8.4–10.5)
Chloride: 106 mEq/L (ref 96–112)
Creatinine, Ser: 1.16 mg/dL (ref 0.50–1.35)
GFR calc Af Amer: 68 mL/min — ABNORMAL LOW (ref >90)
GFR calc non Af Amer: 58 mL/min — ABNORMAL LOW (ref >90)
Glucose, Bld: 113 mg/dL — ABNORMAL HIGH (ref 70–99)
Potassium: 4.6 mEq/L (ref 3.5–5.1)
Sodium: 144 mEq/L (ref 135–145)
Total Bilirubin: 0.3 mg/dL (ref 0.3–1.2)
Total Protein: 6.9 g/dL (ref 6.0–8.3)

## 2012-02-08 LAB — PROTIME-INR
INR: 1.04 (ref 0.00–1.49)
Prothrombin Time: 13.5 seconds (ref 11.6–15.2)

## 2012-02-08 LAB — APTT: aPTT: 40 seconds — ABNORMAL HIGH (ref 24–37)

## 2012-02-08 SURGERY — INSERTION, TUNNELED CENTRAL VENOUS DEVICE, WITH PORT
Anesthesia: Monitor Anesthesia Care | Site: Chest | Laterality: Right | Wound class: Clean

## 2012-02-08 MED ORDER — SODIUM CHLORIDE 0.9 % IR SOLN
Status: DC | PRN
  Administered 2012-02-08: 1000 mL

## 2012-02-08 MED ORDER — MUPIROCIN 2 % EX OINT
TOPICAL_OINTMENT | Freq: Two times a day (BID) | CUTANEOUS | Status: DC
  Administered 2012-02-08: 1 via NASAL

## 2012-02-08 MED ORDER — PROPOFOL 10 MG/ML IV BOLUS
INTRAVENOUS | Status: DC | PRN
  Administered 2012-02-08: 20 mg via INTRAVENOUS

## 2012-02-08 MED ORDER — MUPIROCIN 2 % EX OINT
TOPICAL_OINTMENT | CUTANEOUS | Status: AC
  Administered 2012-02-08: 1 via NASAL
  Filled 2012-02-08: qty 22

## 2012-02-08 MED ORDER — MIDAZOLAM HCL 5 MG/5ML IJ SOLN
INTRAMUSCULAR | Status: DC | PRN
  Administered 2012-02-08 (×2): 1 mg via INTRAVENOUS

## 2012-02-08 MED ORDER — HYDROMORPHONE HCL PF 1 MG/ML IJ SOLN: 0.2500 mg | INTRAMUSCULAR | Status: DC | PRN

## 2012-02-08 MED ORDER — ONDANSETRON HCL 4 MG/2ML IJ SOLN
INTRAMUSCULAR | Status: DC | PRN
  Administered 2012-02-08: 4 mg via INTRAVENOUS

## 2012-02-08 MED ORDER — ONDANSETRON HCL 4 MG/2ML IJ SOLN: 4.0000 mg | Freq: Once | INTRAMUSCULAR | Status: DC | PRN

## 2012-02-08 MED ORDER — HEPARIN SOD (PORK) LOCK FLUSH 100 UNIT/ML IV SOLN
INTRAVENOUS | Status: AC
  Filled 2012-02-08: qty 5

## 2012-02-08 MED ORDER — LACTATED RINGERS IV SOLN
INTRAVENOUS | Status: DC | PRN
  Administered 2012-02-08: 07:00:00 via INTRAVENOUS

## 2012-02-08 MED ORDER — SODIUM CHLORIDE 0.9 % IR SOLN
Status: DC | PRN
  Administered 2012-02-08: 09:00:00

## 2012-02-08 MED ORDER — FENTANYL CITRATE 0.05 MG/ML IJ SOLN
INTRAMUSCULAR | Status: DC | PRN
  Administered 2012-02-08: 50 ug via INTRAVENOUS
  Administered 2012-02-08 (×2): 25 ug via INTRAVENOUS

## 2012-02-08 MED ORDER — EPHEDRINE SULFATE 50 MG/ML IJ SOLN
INTRAMUSCULAR | Status: DC | PRN
  Administered 2012-02-08: 10 mg via INTRAVENOUS

## 2012-02-08 MED ORDER — LIDOCAINE-EPINEPHRINE (PF) 1 %-1:200000 IJ SOLN
INTRAMUSCULAR | Status: DC | PRN
  Administered 2012-02-08: 3 mL via INTRADERMAL

## 2012-02-08 MED ORDER — PROPOFOL INFUSION 10 MG/ML OPTIME
INTRAVENOUS | Status: DC | PRN
  Administered 2012-02-08: 25 ug/kg/min via INTRAVENOUS

## 2012-02-08 SURGICAL SUPPLY — 39 items
APL SKNCLS STERI-STRIP NONHPOA (GAUZE/BANDAGES/DRESSINGS) ×1
BAG DECANTER FOR FLEXI CONT (MISCELLANEOUS) ×3 IMPLANT
BENZOIN TINCTURE PRP APPL 2/3 (GAUZE/BANDAGES/DRESSINGS) ×2 IMPLANT
BLADE SURG 11 STRL SS (BLADE) ×3 IMPLANT
CANISTER SUCTION 2500CC (MISCELLANEOUS) ×3 IMPLANT
CLOTH BEACON ORANGE TIMEOUT ST (SAFETY) ×3 IMPLANT
COVER SURGICAL LIGHT HANDLE (MISCELLANEOUS) ×6 IMPLANT
DRAPE C-ARM 42X72 X-RAY (DRAPES) ×3 IMPLANT
DRAPE CHEST BREAST 15X10 FENES (DRAPES) ×3 IMPLANT
DRAPE LAPAROTOMY TRNSV 102X78 (DRAPE) ×3 IMPLANT
DRSG TEGADERM 4X4.75 (GAUZE/BANDAGES/DRESSINGS) ×2 IMPLANT
ELECT CAUTERY BLADE 6.4 (BLADE) ×3 IMPLANT
ELECT REM PT RETURN 9FT ADLT (ELECTROSURGICAL) ×3
ELECTRODE REM PT RTRN 9FT ADLT (ELECTROSURGICAL) ×1 IMPLANT
GLOVE BIO SURGEON STRL SZ7.5 (GLOVE) ×6 IMPLANT
GOWN STRL NON-REIN LRG LVL3 (GOWN DISPOSABLE) ×3 IMPLANT
GUIDEWIRE UNCOATED ST S 7038 (WIRE) IMPLANT
INTRODUCER 13FR (MISCELLANEOUS) IMPLANT
INTRODUCER COOK 11FR (CATHETERS) IMPLANT
KIT BASIN OR (CUSTOM PROCEDURE TRAY) ×3 IMPLANT
KIT PORT POWER 9.6FR MRI PREA (Catheter) ×2 IMPLANT
KIT PORT POWER ISP 8FR (Catheter) IMPLANT
KIT POWER CATH 8FR (Catheter) IMPLANT
KIT ROOM TURNOVER OR (KITS) ×3 IMPLANT
NEEDLE 22X1 1/2 (OR ONLY) (NEEDLE) ×3 IMPLANT
NS IRRIG 1000ML POUR BTL (IV SOLUTION) ×3 IMPLANT
PACK GENERAL/GYN (CUSTOM PROCEDURE TRAY) ×3 IMPLANT
PAD ARMBOARD 7.5X6 YLW CONV (MISCELLANEOUS) ×6 IMPLANT
SET SHEATH INTRODUCER 10FR (MISCELLANEOUS) IMPLANT
SPONGE GAUZE 4X4 12PLY (GAUZE/BANDAGES/DRESSINGS) ×3 IMPLANT
SUT ETHILON 3 0 FSL (SUTURE) ×3 IMPLANT
SUT VIC AB 3-0 SH 8-18 (SUTURE) ×2 IMPLANT
SUT VIC AB 3-0 X1 27 (SUTURE) ×3 IMPLANT
SYR 20CC LL (SYRINGE) ×3 IMPLANT
SYR CONTROL 10ML LL (SYRINGE) ×3 IMPLANT
TAPE CLOTH SURG 4X10 WHT LF (GAUZE/BANDAGES/DRESSINGS) ×2 IMPLANT
TOWEL OR 17X24 6PK STRL BLUE (TOWEL DISPOSABLE) ×3 IMPLANT
TOWEL OR 17X26 10 PK STRL BLUE (TOWEL DISPOSABLE) ×3 IMPLANT
WATER STERILE IRR 1000ML POUR (IV SOLUTION) ×3 IMPLANT

## 2012-02-08 NOTE — Op Note (Signed)
NAME:  MOSI, HANNOLD NO.:  [id number]  MEDICAL RECORD NO.:  [id number]  LOCATION:  [facility]                         FACILITY:  [facility]  PHYSICIAN:  [name], M.D.  DATE OF BIRTH:  02/05/1932  DATE OF PROCEDURE:  02/08/2012 DATE OF DISCHARGE:                              OPERATIVE REPORT   OPERATION:  Placement of Port-A-Cath system, right subclavian vein.  SURGEON:  [name], MD  PREOPERATIVE DIAGNOSIS:  Stage IV non-small-cell carcinoma of the right lung.  POSTOPERATIVE DIAGNOSIS:  Stage IV non-small-cell carcinoma of the right lung.  ANESTHESIA:  MAC.  PROCEDURE:  The patient was brought to the operating room after informed consent for placement of a Port-A-Cath for chemotherapy for advanced stage lung cancer.  He was given intravenous conscious sedation, monitored by Anesthesia.  The chest was prepped and draped as a sterile field.  A proper time-out was performed.  Lidocaine 1% was infiltrated beneath the right clavicle and in the midclavicular line in the anterior right chest.  A small incision was made beneath the clavicle.  Using the Seldinger technique, the subclavian vein was cannulated and a guidewire passed under fluoroscopic control to the right atrium.  Lidocaine was infiltrated in the chest wall and a 1 inch incision was made and a pocket created for the Port-A-Cath reservoir.  The Port-A- Cath was flushed with saline.  The Port-A-Cath was tunneled from the lower incision to the upper incision.  Over the guidewire, a dilator tear-away sheath system was inserted.  The guidewire was removed.  The dilator was removed.  Through this sheath, the Port-A-Cath was then advanced and the sheath was removed.  The Port-A-Cath was flushed with heparin solution.  Fluoroscopy confirmed the catheter to be in the proper position.  The 2 incisions were then closed.  The main pocket incision was closed with Vicryl for the subcu and subcuticular  layers.  The small incision made in the clavicle was closed with two nylon and skin sutures. Sterile dressings were applied.  The patient returned to recovery room where chest x-ray showed good placement of the Port-A-Cath.     [name], M.D.    PV/MEDQ  D:  02/08/2012  T:  02/08/2012  Job:  [id number]  cc:   Lajuana Matte, M.D.

## 2012-02-08 NOTE — Transfer of Care (Signed)
Immediate Anesthesia Transfer of Care Note  Patient: Joel Gibson  Procedure(s) Performed: Procedure(s) (LRB) with comments: INSERTION PORT-A-CATH (Right)  Patient Location: PACU  Anesthesia Type:MAC  Level of Consciousness: awake, alert  and patient cooperative  Airway & Oxygen Therapy: Patient Spontanous Breathing  Post-op Assessment: Report given to PACU RN, Post -op Vital signs reviewed and stable and Patient moving all extremities  Post vital signs: Reviewed and stable  Complications: No apparent anesthesia complications

## 2012-02-08 NOTE — Anesthesia Procedure Notes (Signed)
Procedure Name: MAC Date/Time: 02/08/2012 7:50 AM Performed by: Sherie Don Pre-anesthesia Checklist: Patient identified, Emergency Drugs available, Suction available, Patient being monitored and Timeout performed Patient Re-evaluated:Patient Re-evaluated prior to inductionOxygen Delivery Method: Simple face mask Preoxygenation: Pre-oxygenation with 100% oxygen Intubation Type: IV induction Dental Injury: Teeth and Oropharynx as per pre-operative assessment

## 2012-02-08 NOTE — Telephone Encounter (Signed)
Spoke with wife regarding questions regarding appt with dentist and dermatologist.  I clarified with Dr. Arbutus Ped and gave instructions to keep appt with specialist.

## 2012-02-08 NOTE — Progress Notes (Signed)
The patient was examined and preop studies reviewed. There has been no change from the prior exam and the patient is ready for surgery.  Plan portacath placement this am

## 2012-02-08 NOTE — Brief Op Note (Signed)
02/08/2012  9:04 AM  PATIENT:  Joel Gibson  78 y.o. male  PRE-OPERATIVE DIAGNOSIS:  LUNG CANCER  POST-OPERATIVE DIAGNOSIS:  lung cancer  PROCEDURE:  Procedure(s) (LRB) with comments: INSERTION PORT-A-CATH (Right)  SURGEON:  Surgeon(s) and Role:    * [name], MD - Primary  PHYSICIAN ASSISTANT0:   ASSISTANTS: 0}   ANESTHESIA:   MAC  EBL:  Total I/O In: 600 [I.V.:600] Out: -   BLOOD ADMINISTERED:none  DRAINS: none   LOCAL MEDICATIONS USED:  NONE  SPECIMEN:  No Specimen  DISPOSITION OF SPECIMEN:  N/A  COUNTS:  ACTION TAKEN: None  TOURNIQUET:  * No tourniquets in log *  DICTATION: .Dragon Dictation  PLAN OF CARE: Discharge to home after PACU  PATIENT DISPOSITION:  PACU - hemodynamically stable.   Delay start of Pharmacological VTE agent (>24hrs) due to surgical blood loss or risk of bleeding: yes

## 2012-02-08 NOTE — Anesthesia Postprocedure Evaluation (Signed)
  Anesthesia Post-op Note  Patient: Joel Gibson  Procedure(s) Performed: Procedure(s) (LRB) with comments: INSERTION PORT-A-CATH (Right)  Patient Location: PACU  Anesthesia Type:MAC  Level of Consciousness: awake, alert , oriented and patient cooperative  Airway and Oxygen Therapy: Patient Spontanous Breathing  Post-op Pain: none  Post-op Assessment: Post-op Vital signs reviewed, Patient's Cardiovascular Status Stable, Respiratory Function Stable, Patent Airway, No signs of Nausea or vomiting and Pain level controlled  Post-op Vital Signs: stable  Complications: No apparent anesthesia complications

## 2012-02-08 NOTE — Preoperative (Signed)
Beta Blockers   Reason not to administer Beta Blockers:Not Applicable 

## 2012-02-08 NOTE — Anesthesia Preprocedure Evaluation (Addendum)
Anesthesia Evaluation  Patient identified by MRN, date of birth, ID band Patient awake    Reviewed: Allergy & Precautions, H&P , NPO status , Patient's Chart, lab work & pertinent test results  Airway Mallampati: I TM Distance: >3 FB Neck ROM: full    Dental   Pulmonary shortness of breath and with exertion, COPD COPD inhaler, former smoker,          Cardiovascular hypertension, Pt. on medications + Valvular Problems/Murmurs AS Rhythm:regular Rate:Normal     Neuro/Psych CVA    GI/Hepatic GERD-  Controlled,(+) Hepatitis -  Endo/Other    Renal/GU      Musculoskeletal   Abdominal   Peds  Hematology   Anesthesia Other Findings   Reproductive/Obstetrics                          Anesthesia Physical Anesthesia Plan  ASA: III  Anesthesia Plan: MAC   Post-op Pain Management:    Induction: Intravenous  Airway Management Planned: Mask  Additional Equipment:   Intra-op Plan:   Post-operative Plan:   Informed Consent: I have reviewed the patients History and Physical, chart, labs and discussed the procedure including the risks, benefits and alternatives for the proposed anesthesia with the patient or authorized representative who has indicated his/her understanding and acceptance.     Plan Discussed with: CRNA, Anesthesiologist and Surgeon  Anesthesia Plan Comments:         Anesthesia Quick Evaluation

## 2012-02-09 ENCOUNTER — Encounter (HOSPITAL_COMMUNITY): Payer: Self-pay | Admitting: Cardiothoracic Surgery

## 2012-02-09 ENCOUNTER — Other Ambulatory Visit: Payer: Self-pay | Admitting: Dermatology

## 2012-02-09 HISTORY — PX: SKIN BIOPSY: SHX1

## 2012-02-11 ENCOUNTER — Other Ambulatory Visit: Payer: Medicare Other | Admitting: Lab

## 2012-02-11 ENCOUNTER — Ambulatory Visit: Payer: Medicare Other

## 2012-02-15 ENCOUNTER — Other Ambulatory Visit: Payer: Self-pay | Admitting: *Deleted

## 2012-02-15 ENCOUNTER — Other Ambulatory Visit: Payer: Self-pay | Admitting: Internal Medicine

## 2012-02-15 ENCOUNTER — Other Ambulatory Visit (HOSPITAL_BASED_OUTPATIENT_CLINIC_OR_DEPARTMENT_OTHER): Payer: Medicare Other | Admitting: Lab

## 2012-02-15 ENCOUNTER — Ambulatory Visit (HOSPITAL_BASED_OUTPATIENT_CLINIC_OR_DEPARTMENT_OTHER): Payer: Medicare Other

## 2012-02-15 ENCOUNTER — Encounter: Payer: Self-pay | Admitting: Radiation Oncology

## 2012-02-15 VITALS — BP 182/88 | HR 89 | Temp 97.0°F

## 2012-02-15 DIAGNOSIS — C3490 Malignant neoplasm of unspecified part of unspecified bronchus or lung: Secondary | ICD-10-CM

## 2012-02-15 DIAGNOSIS — Z5111 Encounter for antineoplastic chemotherapy: Secondary | ICD-10-CM

## 2012-02-15 DIAGNOSIS — C7952 Secondary malignant neoplasm of bone marrow: Secondary | ICD-10-CM

## 2012-02-15 DIAGNOSIS — C3410 Malignant neoplasm of upper lobe, unspecified bronchus or lung: Secondary | ICD-10-CM

## 2012-02-15 LAB — CBC WITH DIFFERENTIAL/PLATELET
Basophils Absolute: 0 10*3/uL (ref 0.0–0.1)
Eosinophils Absolute: 0.2 10*3/uL (ref 0.0–0.5)
HCT: 45.4 % (ref 38.4–49.9)
HGB: 15.4 g/dL (ref 13.0–17.1)
LYMPH%: 15.9 % (ref 14.0–49.0)
MCV: 91.9 fL (ref 79.3–98.0)
MONO%: 11.2 % (ref 0.0–14.0)
NEUT#: 6 10*3/uL (ref 1.5–6.5)
Platelets: 216 10*3/uL (ref 140–400)
RDW: 12.3 % (ref 11.0–14.6)

## 2012-02-15 LAB — COMPREHENSIVE METABOLIC PANEL (CC13)
Albumin: 4.1 g/dL (ref 3.5–5.0)
CO2: 26 mEq/L (ref 22–29)
Glucose: 96 mg/dl (ref 70–99)
Sodium: 141 mEq/L (ref 136–145)
Total Bilirubin: 0.49 mg/dL (ref 0.20–1.20)
Total Protein: 7.2 g/dL (ref 6.4–8.3)

## 2012-02-15 MED ORDER — DEXAMETHASONE SODIUM PHOSPHATE 4 MG/ML IJ SOLN
20.0000 mg | Freq: Once | INTRAMUSCULAR | Status: AC
  Administered 2012-02-15: 20 mg via INTRAVENOUS

## 2012-02-15 MED ORDER — SODIUM CHLORIDE 0.9 % IV SOLN
Freq: Once | INTRAVENOUS | Status: AC
  Administered 2012-02-15: 11:00:00 via INTRAVENOUS

## 2012-02-15 MED ORDER — CLONIDINE HCL 0.1 MG PO TABS
0.2000 mg | ORAL_TABLET | Freq: Once | ORAL | Status: AC
  Administered 2012-02-15: 0.2 mg via ORAL

## 2012-02-15 MED ORDER — SODIUM CHLORIDE 0.9 % IJ SOLN
10.0000 mL | INTRAMUSCULAR | Status: DC | PRN
  Administered 2012-02-15: 10 mL
  Filled 2012-02-15: qty 10

## 2012-02-15 MED ORDER — PACLITAXEL PROTEIN-BOUND CHEMO INJECTION 100 MG
100.0000 mg/m2 | Freq: Once | INTRAVENOUS | Status: AC
  Administered 2012-02-15: 175 mg via INTRAVENOUS
  Filled 2012-02-15: qty 35

## 2012-02-15 MED ORDER — HEPARIN SOD (PORK) LOCK FLUSH 100 UNIT/ML IV SOLN
500.0000 [IU] | Freq: Once | INTRAVENOUS | Status: AC | PRN
  Administered 2012-02-15: 500 [IU]
  Filled 2012-02-15: qty 5

## 2012-02-15 MED ORDER — ONDANSETRON 16 MG/50ML IVPB (CHCC)
16.0000 mg | Freq: Once | INTRAVENOUS | Status: AC
  Administered 2012-02-15: 16 mg via INTRAVENOUS

## 2012-02-15 MED ORDER — MORPHINE SULFATE ER 15 MG PO TBCR: 15.0000 mg | EXTENDED_RELEASE_TABLET | Freq: Two times a day (BID) | ORAL | Status: DC

## 2012-02-15 MED ORDER — SODIUM CHLORIDE 0.9 % IV SOLN
462.0000 mg | Freq: Once | INTRAVENOUS | Status: AC
  Administered 2012-02-15: 460 mg via INTRAVENOUS
  Filled 2012-02-15: qty 46

## 2012-02-15 NOTE — Patient Instructions (Signed)
[facility] Discharge Instructions for Patients Receiving Chemotherapy  Today you received the following chemotherapy agents: abraxane, carboplatin.  To help prevent nausea and vomiting after your treatment, we encourage you to take your nausea medication.  Take it as often as prescribed.    If you develop nausea and vomiting that is not controlled by your nausea medication, call the clinic. If it is after clinic hours your family physician or the after hours number for the clinic or go to the Emergency Department.   BELOW ARE SYMPTOMS THAT SHOULD BE REPORTED IMMEDIATELY:  *FEVER GREATER THAN 100.5 F  *CHILLS WITH OR WITHOUT FEVER  NAUSEA AND VOMITING THAT IS NOT CONTROLLED WITH YOUR NAUSEA MEDICATION  *UNUSUAL SHORTNESS OF BREATH  *UNUSUAL BRUISING OR BLEEDING  TENDERNESS IN MOUTH AND THROAT WITH OR WITHOUT PRESENCE OF ULCERS  *URINARY PROBLEMS  *BOWEL PROBLEMS  UNUSUAL RASH Items with * indicate a potential emergency and should be followed up as soon as possible.  One of the nurses will contact you 24 hours after your treatment. Please let the nurse know about any problems that you may have experienced. Feel free to call the clinic you have any questions or concerns. The clinic phone number is [phone].   I have been informed and understand all the instructions given to me. I know to contact the clinic, my physician, or go to the Emergency Department if any problems should occur. I do not have any questions at this time, but understand that I may call the clinic during office hours   should I have any questions or need assistance in obtaining follow up care.    __________________________________________  _____________  __________ Signature of Patient or Authorized Representative            Date                   Time    __________________________________________ Nurse's Signature   Carboplatin injection What is this medicine? CARBOPLATIN (KAR  boe pla tin) is a chemotherapy drug. It targets fast dividing cells, like cancer cells, and causes these cells to die. This medicine is used to treat ovarian cancer and many other cancers. This medicine may be used for other purposes; ask your health care provider or pharmacist if you have questions. What should I tell my health care provider before I take this medicine? They need to know if you have any of these conditions: -blood disorders -hearing problems -kidney disease -recent or ongoing radiation therapy -an unusual or allergic reaction to carboplatin, cisplatin, other chemotherapy, other medicines, foods, dyes, or preservatives -pregnant or trying to get pregnant -breast-feeding How should I use this medicine? This drug is usually given as an infusion into a vein. It is administered in a hospital or clinic by a specially trained health care professional. Talk to your pediatrician regarding the use of this medicine in children. Special care may be needed. Overdosage: If you think you have taken too much of this medicine contact a poison control center or emergency room at once. NOTE: This medicine is only for you. Do not share this medicine with others. What if I miss a dose? It is important not to miss a dose. Call your doctor or health care professional if you are unable to keep an appointment. What may interact with this medicine? -medicines for seizures -medicines to increase blood counts like filgrastim, pegfilgrastim, sargramostim -some antibiotics like amikacin, gentamicin, neomycin, streptomycin, tobramycin -vaccines Talk to your doctor or  health care professional before taking any of these medicines: -acetaminophen -aspirin -ibuprofen -ketoprofen -naproxen This list may not describe all possible interactions. Give your health care provider a list of all the medicines, herbs, non-prescription drugs, or dietary supplements you use. Also tell them if you smoke, drink  alcohol, or use illegal drugs. Some items may interact with your medicine. What should I watch for while using this medicine? Your condition will be monitored carefully while you are receiving this medicine. You will need important blood work done while you are taking this medicine. This drug may make you feel generally unwell. This is not uncommon, as chemotherapy can affect healthy cells as well as cancer cells. Report any side effects. Continue your course of treatment even though you feel ill unless your doctor tells you to stop. In some cases, you may be given additional medicines to help with side effects. Follow all directions for their use. Call your doctor or health care professional for advice if you get a fever, chills or sore throat, or other symptoms of a cold or flu. Do not treat yourself. This drug decreases your body's ability to fight infections. Try to avoid being around people who are sick. This medicine may increase your risk to bruise or bleed. Call your doctor or health care professional if you notice any unusual bleeding. Be careful brushing and flossing your teeth or using a toothpick because you may get an infection or bleed more easily. If you have any dental work done, tell your dentist you are receiving this medicine. Avoid taking products that contain aspirin, acetaminophen, ibuprofen, naproxen, or ketoprofen unless instructed by your doctor. These medicines may hide a fever. Do not become pregnant while taking this medicine. Women should inform their doctor if they wish to become pregnant or think they might be pregnant. There is a potential for serious side effects to an unborn child. Talk to your health care professional or pharmacist for more information. Do not breast-feed an infant while taking this medicine. What side effects may I notice from receiving this medicine? Side effects that you should report to your doctor or health care professional as soon as  possible: -allergic reactions like skin rash, itching or hives, swelling of the face, lips, or tongue -signs of infection - fever or chills, cough, sore throat, pain or difficulty passing urine -signs of decreased platelets or bleeding - bruising, pinpoint red spots on the skin, black, tarry stools, nosebleeds -signs of decreased red blood cells - unusually weak or tired, fainting spells, lightheadedness -breathing problems -changes in hearing -changes in vision -chest pain -high blood pressure -low blood counts - This drug may decrease the number of white blood cells, red blood cells and platelets. You may be at increased risk for infections and bleeding. -nausea and vomiting -pain, swelling, redness or irritation at the injection site -pain, tingling, numbness in the hands or feet -problems with balance, talking, walking -trouble passing urine or change in the amount of urine Side effects that usually do not require medical attention (report to your doctor or health care professional if they continue or are bothersome): -hair loss -loss of appetite -metallic taste in the mouth or changes in taste This list may not describe all possible side effects. Call your doctor for medical advice about side effects. You may report side effects to FDA at 1-800-FDA-1088. Where should I keep my medicine? This drug is given in a hospital or clinic and will not be stored at home. NOTE: This  sheet is a summary. It may not cover all possible information. If you have questions about this medicine, talk to your doctor, pharmacist, or health care provider.  2012, [employer]. (05/31/2007 2:38:05 PM)    Nanoparticle Albumin-Bound Paclitaxel injection What is this medicine? NANOPARTICLE ALBUMIN-BOUND PACLITAXEL (Na no PAHR ti kuhl al BYOO muhn-bound PAK li TAX el) is a chemotherapy drug. It targets fast dividing cells, like cancer cells, and causes these cells to die. This medicine is used to  treat advanced breast cancer and advanced lung cancer. This medicine may be used for other purposes; ask your health care provider or pharmacist if you have questions. What should I tell my health care provider before I take this medicine? They need to know if you have any of these conditions: -kidney disease -liver disease -low blood counts, like low platelets, red blood cells, or white blood cells -recent or ongoing radiation therapy -an unusual or allergic reaction to paclitaxel, albumin, other chemotherapy, other medicines, foods, dyes, or preservatives -pregnant or trying to get pregnant -breast-feeding How should I use this medicine? This drug is given as an infusion into a vein. It is administered in a hospital or clinic by a specially trained health care professional. Talk to your pediatrician regarding the use of this medicine in children. Special care may be needed. Overdosage: If you think you have taken too much of this medicine contact a poison control center or emergency room at once. NOTE: This medicine is only for you. Do not share this medicine with others. What if I miss a dose? It is important not to miss your dose. Call your doctor or health care professional if you are unable to keep an appointment. What may interact with this medicine? This medicine may also interact with the following medications: -cyclosporine -dexamethasone -diazepam -ketoconazole -medicines to increase blood counts like filgrastim, pegfilgrastim, sargramostim -other chemotherapy drugs like cisplatin, doxorubicin, epirubicin, etoposide, teniposide, vincristine -quinidine -testosterone -vaccines -verapamil Talk to your doctor or health care professional before taking any of these medicines: -acetaminophen -aspirin -ibuprofen -ketoprofen -naproxen This list may not describe all possible interactions. Give your health care provider a list of all the medicines, herbs, non-prescription drugs,  or dietary supplements you use. Also tell them if you smoke, drink alcohol, or use illegal drugs. Some items may interact with your medicine. What should I watch for while using this medicine? Your condition will be monitored carefully while you are receiving this medicine. You will need important blood work done while you are taking this medicine. This drug may make you feel generally unwell. This is not uncommon, as chemotherapy can affect healthy cells as well as cancer cells. Report any side effects. Continue your course of treatment even though you feel ill unless your doctor tells you to stop. In some cases, you may be given additional medicines to help with side effects. Follow all directions for their use. Call your doctor or health care professional for advice if you get a fever, chills or sore throat, or other symptoms of a cold or flu. Do not treat yourself. This drug decreases your body's ability to fight infections. Try to avoid being around people who are sick. This medicine may increase your risk to bruise or bleed. Call your doctor or health care professional if you notice any unusual bleeding. Be careful brushing and flossing your teeth or using a toothpick because you may get an infection or bleed more easily. If you have any dental work done, tell your  dentist you are receiving this medicine. Avoid taking products that contain aspirin, acetaminophen, ibuprofen, naproxen, or ketoprofen unless instructed by your doctor. These medicines may hide a fever. Do not become pregnant while taking this medicine. Women should inform their doctor if they wish to become pregnant or think they might be pregnant. There is a potential for serious side effects to an unborn child. Talk to your health care professional or pharmacist for more information. Do not breast-feed an infant while taking this medicine. Men are advised not to father a child while receiving this medicine. What side effects may I  notice from receiving this medicine? Side effects that you should report to your doctor or health care professional as soon as possible: -allergic reactions like skin rash, itching or hives, swelling of the face, lips, or tongue -low blood counts - This drug may decrease the number of white blood cells, red blood cells and platelets. You may be at increased risk for infections and bleeding. -signs of infection - fever or chills, cough, sore throat, pain or difficulty passing urine -signs of decreased platelets or bleeding - bruising, pinpoint red spots on the skin, black, tarry stools, nosebleeds -signs of decreased red blood cells - unusually weak or tired, fainting spells, lightheadedness -breathing problems -changes in vision -chest pain -high or low blood pressure -mouth sores -nausea and vomiting -pain, swelling, redness or irritation at the injection site -pain, tingling, numbness in the hands or feet -slow or irregular heartbeat -swelling of the ankle, feet, hands Side effects that usually do not require medical attention (report to your doctor or health care professional if they continue or are bothersome): -aches, pains -changes in the color of fingernails -diarrhea -hair loss -loss of appetite This list may not describe all possible side effects. Call your doctor for medical advice about side effects. You may report side effects to FDA at 1-800-FDA-1088. Where should I keep my medicine? This drug is given in a hospital or clinic and will not be stored at home. NOTE: This sheet is a summary. It may not cover all possible information. If you have questions about this medicine, talk to your doctor, pharmacist, or health care provider.  2013, [employer] Standard. (12/29/2010 4:13:49 PM)

## 2012-02-16 ENCOUNTER — Ambulatory Visit
Admission: RE | Admit: 2012-02-16 | Discharge: 2012-02-16 | Disposition: A | Discharge status: Home / Self Care | Payer: Medicare Other | Source: Ambulatory Visit | Attending: Radiation Oncology | Admitting: Radiation Oncology

## 2012-02-16 ENCOUNTER — Encounter: Payer: Self-pay | Admitting: Radiation Oncology

## 2012-02-16 ENCOUNTER — Ambulatory Visit (INDEPENDENT_AMBULATORY_CARE_PROVIDER_SITE_OTHER): Payer: Medicare Other | Admitting: *Deleted

## 2012-02-16 ENCOUNTER — Telehealth: Payer: Self-pay | Admitting: *Deleted

## 2012-02-16 VITALS — BP 132/56 | HR 101 | Temp 97.4°F | Resp 20 | Ht 67.0 in | Wt 154.1 lb

## 2012-02-16 DIAGNOSIS — K649 Unspecified hemorrhoids: Secondary | ICD-10-CM | POA: Insufficient documentation

## 2012-02-16 DIAGNOSIS — C801 Malignant (primary) neoplasm, unspecified: Secondary | ICD-10-CM | POA: Insufficient documentation

## 2012-02-16 DIAGNOSIS — Z95828 Presence of other vascular implants and grafts: Secondary | ICD-10-CM

## 2012-02-16 DIAGNOSIS — H348192 Central retinal vein occlusion, unspecified eye, stable: Secondary | ICD-10-CM | POA: Insufficient documentation

## 2012-02-16 DIAGNOSIS — C7951 Secondary malignant neoplasm of bone: Secondary | ICD-10-CM | POA: Insufficient documentation

## 2012-02-16 DIAGNOSIS — J449 Chronic obstructive pulmonary disease, unspecified: Secondary | ICD-10-CM | POA: Insufficient documentation

## 2012-02-16 DIAGNOSIS — C7952 Secondary malignant neoplasm of bone marrow: Secondary | ICD-10-CM | POA: Insufficient documentation

## 2012-02-16 DIAGNOSIS — Z9849 Cataract extraction status, unspecified eye: Secondary | ICD-10-CM | POA: Insufficient documentation

## 2012-02-16 DIAGNOSIS — D381 Neoplasm of uncertain behavior of trachea, bronchus and lung: Secondary | ICD-10-CM

## 2012-02-16 DIAGNOSIS — R0602 Shortness of breath: Secondary | ICD-10-CM | POA: Insufficient documentation

## 2012-02-16 DIAGNOSIS — R918 Other nonspecific abnormal finding of lung field: Secondary | ICD-10-CM | POA: Insufficient documentation

## 2012-02-16 DIAGNOSIS — Z7982 Long term (current) use of aspirin: Secondary | ICD-10-CM | POA: Insufficient documentation

## 2012-02-16 DIAGNOSIS — K759 Inflammatory liver disease, unspecified: Secondary | ICD-10-CM | POA: Insufficient documentation

## 2012-02-16 DIAGNOSIS — Z4802 Encounter for removal of sutures: Secondary | ICD-10-CM

## 2012-02-16 DIAGNOSIS — K2970 Gastritis, unspecified, without bleeding: Secondary | ICD-10-CM | POA: Insufficient documentation

## 2012-02-16 DIAGNOSIS — H409 Unspecified glaucoma: Secondary | ICD-10-CM | POA: Insufficient documentation

## 2012-02-16 DIAGNOSIS — Z510 Encounter for antineoplastic radiation therapy: Secondary | ICD-10-CM | POA: Insufficient documentation

## 2012-02-16 DIAGNOSIS — K5289 Other specified noninfective gastroenteritis and colitis: Secondary | ICD-10-CM | POA: Insufficient documentation

## 2012-02-16 DIAGNOSIS — S2231XA Fracture of one rib, right side, initial encounter for closed fracture: Secondary | ICD-10-CM | POA: Insufficient documentation

## 2012-02-16 DIAGNOSIS — Z79899 Other long term (current) drug therapy: Secondary | ICD-10-CM | POA: Insufficient documentation

## 2012-02-16 DIAGNOSIS — C3490 Malignant neoplasm of unspecified part of unspecified bronchus or lung: Secondary | ICD-10-CM

## 2012-02-16 DIAGNOSIS — K219 Gastro-esophageal reflux disease without esophagitis: Secondary | ICD-10-CM | POA: Insufficient documentation

## 2012-02-16 DIAGNOSIS — R109 Unspecified abdominal pain: Secondary | ICD-10-CM | POA: Insufficient documentation

## 2012-02-16 DIAGNOSIS — L98499 Non-pressure chronic ulcer of skin of other sites with unspecified severity: Secondary | ICD-10-CM | POA: Insufficient documentation

## 2012-02-16 HISTORY — DX: Cough, unspecified: R05.9

## 2012-02-16 HISTORY — DX: Unspecified glaucoma: H40.9

## 2012-02-16 HISTORY — DX: Hyperlipidemia, unspecified: E78.5

## 2012-02-16 HISTORY — DX: Other nonspecific abnormal finding of lung field: R91.8

## 2012-02-16 HISTORY — DX: Fracture of one rib, right side, initial encounter for closed fracture: S22.31XA

## 2012-02-16 HISTORY — DX: Cough: R05

## 2012-02-16 HISTORY — DX: Shortness of breath: R06.02

## 2012-02-16 HISTORY — DX: Secondary malignant neoplasm of bone: C79.51

## 2012-02-16 NOTE — Telephone Encounter (Signed)
Wife reports pt is doing well today " getting along great"

## 2012-02-16 NOTE — Progress Notes (Signed)
Outpatient  CT simulation/ treatment planning/ Special Tx Procedure  Bone metastases  The patient was taken the CT simulator and laid in the supine position with his arms over his head in a wing board in his legs in a VACLOCK. High-resolution CT axial imaging was obtained the patient's chest and pelvis. I placed an isocenter within the left chest wall and another isocenter within the right pelvis. Skin markings are made and he tolerated the procedure well.  Treatment planning note: I plan to treat the patient's left rib and scapular disease with an AP PA arrangement using MLCs for custom blocks as needed. I will deliver 8 gray in 1 fraction to this site.   I plan to treat the patient's right hip disease and his sacral disease with opposed oblique fields using MLCs as needed for custom blocks. I will deliver 30 gray in 10 fractions to the site  Special Treatment Procedure Note: The patient will be receiving chemotherapy concurrently. Chemotherapy heightens the risk of side effects. I have considered this during the patient's treatment planning process and will monitor the patient accordingly for side effects on a weekly basis. Concurrent chemotherapy increases the complexity of this patient's treatment and therefore this constitutes a special treatment procedure.  -----------------------------------  Lonie Peak, MD

## 2012-02-16 NOTE — Telephone Encounter (Signed)
Message copied by Augusto Garbe on Tue Feb 16, 2012  1:55 PM ------      Message from: Adella Hare K      Created: Mon Feb 15, 2012  2:18 PM      Regarding: 1st time chemo       Contact: [phone]       Cel [id number].  Abraxane carboplatin

## 2012-02-16 NOTE — Progress Notes (Signed)
Pt had 1st chemo yesterday but was not completed due to hypertension. Pt states his right hip pain much improved since starting Morphine last night; he'll take BID. Discussed constipation prevention w/pt. Pt states he has pain in left shoulder w/certain movements. Pt denies cough, SOB, loss of appetite, fatigue.

## 2012-02-16 NOTE — Telephone Encounter (Signed)
Message left requesting a return call for chemotherapy f/u.  Awaiting return call from patient.

## 2012-02-16 NOTE — Progress Notes (Signed)
Radiation Oncology         [phone] ________________________________  Initial outpatient Consultation  Name: Joel Gibson MRN: [id number]  Date: 02/16/2012  DOB: December 06, 1931  WU:JWJX,BJYNWGNFAO R, MD  Si Gaul, MD   REFERRING PHYSICIAN: Si Gaul, MD  DIAGNOSIS: The encounter diagnosis was Bone metastases. metastatic squamous cell carcinoma, consistent with lung primary  HISTORY OF PRESENT ILLNESS::Joel Gibson is a 78 y.o. male who had a long-standing history of smoking. He presented with rib pain. He underwent imaging of his chest which revealed a right upper lobe mass and evidence of rib fracture - thought to be secondary to metastatic disease - in the left rib cage. PET scan in November revealed hypermetabolic activity in the right upper lobe lung mass with evidence of multiple osseous metastases. MRI of the brain was negative for metastatic disease. He underwent a CT guided core needle biopsy of the left iliac bone and this revealed metastatic squamous cell carcinoma. He started chemotherapy yesterday - he is receiving weekly abraxane and carboplatin.   He reports constipation related to his pain medications. He denies any urinary symptoms. He reports that the pain has been worse in his right hip but this is alleviated almost entirely by morphine. However, he would like to decrease his doses of pain medications if possible. He also has some pain in the left scapula and left rib cage laterally. He denies any significant left hip pain or significant pain at other sites of his body. He denies weight loss. His baseline shortness of breath has not changed. He does take Spiriva / albuterol for his COPD. ECoG performance status is 1.  PREVIOUS RADIATION THERAPY: No  PAST MEDICAL HISTORY:  has a past medical history of Hyperlipidemia; Hypertension; Aortic stenosis; Gastritis; COPD (chronic obstructive pulmonary disease); Bronchitis; Hepatitis; cataract surgery; Tobacco abuse;  Lung cancer (02/02/2012); Shortness of breath; Retinal vein occlusion (1990); GERD (gastroesophageal reflux disease); Hemorrhoid; Aortic stenosis; Hypertension; Dyslipidemia; Glaucoma; Right rib fracture; Lung mass; Bone metastases; SOB (shortness of breath); Cough; and Stroke (2002).    PAST SURGICAL HISTORY: Past Surgical History  Procedure Date  . Vasectomy   . Tonsillectomy   . Leg surgery     rt for nerve impingement  . Collarbone fracture     1977  . Tonsillectomy   . Eye surgery     catarct bil  . Portacath placement 02/08/2012    Procedure: INSERTION PORT-A-CATH;  Surgeon: [name], MD;  Location: [facility] OR;  Service: Thoracic;  Laterality: Right;  . Ct guided core biopsy 01/26/12    Left Lytic Lesion - Metastatic Squamous Cell Carcinoma  . Skin biopsy 02/09/12    Right Upper Back, shave : Melanoma In Situ Arising  From a Dysplastic Nevus    FAMILY HISTORY: family history includes Heart attack in his fathers; Leukemia in his mother; and Throat cancer in his father.  SOCIAL HISTORY:  reports that he quit smoking about 4 weeks ago. His smoking use included Cigarettes. He has a 130 pack-year smoking history. He has never used smokeless tobacco. He reports that he drinks alcohol. He reports that he does not use illicit drugs.  ALLERGIES: Review of patient's allergies indicates no known allergies.  MEDICATIONS:  Current Outpatient Prescriptions  Medication Sig Dispense Refill  . albuterol (PROVENTIL HFA;VENTOLIN HFA) 108 (90 BASE) MCG/ACT inhaler Inhale 2 puffs into the lungs every 4 (four) hours as needed.      Marland Kitchen amLODipine (NORVASC) 10 MG tablet Take 10 mg by  mouth daily.        Marland Kitchen aspirin 81 MG tablet Take 81 mg by mouth daily.      . Aspirin-Dipyridamole (AGGRENOX PO) Take 25-200 mg by mouth 2 (two) times daily. Takes 2 tabs one day alt. with 1 tab next day,etc.      . betaxolol (BETOPTIC-S) 0.25 % ophthalmic suspension Place 1 drop into both eyes daily. One drop in the left  eye in the am....one drop in the right eye in the am and in the pm.      . HYDROcodone-homatropine (HYCODAN) 5-1.5 MG/5ML syrup Take 5 mLs by mouth every 6 (six) hours as needed.      . irbesartan (AVAPRO) 300 MG tablet Take 300 mg by mouth daily.      Marland Kitchen lidocaine-prilocaine (EMLA) cream Apply topically as needed.  30 g  0  . morphine (MS CONTIN) 15 MG 12 hr tablet Take 1 tablet (15 mg total) by mouth 2 (two) times daily.  60 tablet  0  . Multiple Vitamin (MULTIVITAMIN PO) Take 1 tablet by mouth daily.        . niacin (NIASPAN) 500 MG CR tablet Take 500 mg by mouth at bedtime.       . prochlorperazine (COMPAZINE) 10 MG tablet Take 10 mg by mouth every 6 (six) hours as needed.      . Ranitidine HCl ([facility] RANITIDINE PO) Take 150 mg by mouth daily.       . rosuvastatin (CRESTOR) 40 MG tablet Take 20 mg by mouth daily.       Marland Kitchen senna (SENOKOT) 8.6 MG TABS Take 1 tablet by mouth.      . tiotropium (SPIRIVA) 18 MCG inhalation capsule Place 18 mcg into inhaler and inhale daily.        . [DISCONTINUED] prochlorperazine (COMPAZINE) 10 MG tablet Take 1 tablet (10 mg total) by mouth every 6 (six) hours as needed.  60 tablet  0  . HYDROcodone-acetaminophen (VICODIN) 5-500 MG per tablet Take 1 tablet by mouth every 4 (four) hours as needed.       No current facility-administered medications for this encounter.   Facility-Administered Medications Ordered in Other Encounters  Medication Dose Route Frequency Provider Last Rate Last Dose  . [COMPLETED] 0.9 %  sodium chloride infusion   Intravenous Once Si Gaul, MD      . [COMPLETED] CARBOplatin (PARAPLATIN) 460 mg in sodium chloride 0.9 % 250 mL chemo infusion  460 mg Intravenous Once Si Gaul, MD   460 mg at 02/15/12 1437  . [COMPLETED] cloNIDine (CATAPRES) tablet 0.2 mg  0.2 mg Oral Once Si Gaul, MD   0.2 mg at 02/15/12 1406  . [COMPLETED] dexamethasone (DECADRON) injection 20 mg  20 mg Intravenous Once Si Gaul, MD   20 mg at  02/15/12 1158  . [COMPLETED] heparin lock flush 100 unit/mL  500 Units Intracatheter Once PRN Si Gaul, MD   500 Units at 02/15/12 1514  . [COMPLETED] ondansetron (ZOFRAN) IVPB 16 mg  16 mg Intravenous Once Si Gaul, MD   16 mg at 02/15/12 1158  . [COMPLETED] PACLitaxel-protein bound (ABRAXANE) chemo infusion 175 mg  100 mg/m2 (Treatment Plan Actual) Intravenous Once Si Gaul, MD   175 mg at 02/15/12 1249  . [DISCONTINUED] sodium chloride 0.9 % injection 10 mL  10 mL Intracatheter PRN Si Gaul, MD   10 mL at 02/15/12 1514    REVIEW OF SYSTEMS:  A complete review of systems was obtained. It is notable  for that above.  PHYSICAL EXAM:  height is 5\' 7"  (1.702 m) and weight is 154 lb 1.6 oz (69.899 kg). His oral temperature is 97.4 F (36.3 C). His blood pressure is 132/56 and his pulse is 101. His respiration is 20 and oxygen saturation is 98%.   General: Alert and oriented, in no acute distress HEENT: Head is normocephalic. Pupils are equally round and reactive to light. Extraocular movements are intact. Oropharynx is clear. Neck: Neck is supple, no palpable cervical or supraclavicular lymphadenopathy. Heart: Regular in rate and rhythm, distant heart sounds with no murmurs, rubs, or gallops. Chest: Clear to auscultation bilaterally, with no rhonchi, wheezes, or rales. Abdomen: Soft, nontender, nondistended, with no rigidity or guarding. Extremities: No cyanosis or edema. Lymphatics: No concerning lymphadenopathy. Skin: No concerning lesions. Musculoskeletal: symmetric strength and muscle tone throughout. He has tenderness to palpation in the left lower scapula as well as in the right sacroiliac joint Neurologic: Cranial nerves II through XII are grossly intact. No obvious focalities. Speech is fluent. Coordination is intact. Psychiatric: Judgment and insight are intact. Affect is appropriate.    LABORATORY DATA:  Lab Results  Component Value Date   WBC 8.5  02/15/2012   HGB 15.4 02/15/2012   HCT 45.4 02/15/2012   MCV 91.9 02/15/2012   PLT 216 02/15/2012   CMP     Component Value Date/Time   NA 141 02/15/2012 1041   NA 144 02/08/2012 0620   K 4.1 02/15/2012 1041   K 4.6 02/08/2012 0620   CL 105 02/15/2012 1041   CL 106 02/08/2012 0620   CO2 26 02/15/2012 1041   CO2 27 02/08/2012 0620   GLUCOSE 96 02/15/2012 1041   GLUCOSE 113* 02/08/2012 0620   BUN 16.0 02/15/2012 1041   BUN 20 02/08/2012 0620   CREATININE 1.0 02/15/2012 1041   CREATININE 1.16 02/08/2012 0620   CALCIUM 9.6 02/15/2012 1041   CALCIUM 9.6 02/08/2012 0620   PROT 7.2 02/15/2012 1041   PROT 6.9 02/08/2012 0620   ALBUMIN 4.1 02/15/2012 1041   ALBUMIN 3.7 02/08/2012 0620   AST 22 02/15/2012 1041   AST 17 02/08/2012 0620   ALT 23 02/15/2012 1041   ALT 15 02/08/2012 0620   ALKPHOS 103 02/15/2012 1041   ALKPHOS 90 02/08/2012 0620   BILITOT 0.49 02/15/2012 1041   BILITOT 0.3 02/08/2012 0620   GFRNONAA 58* 02/08/2012 0620   GFRAA 68* 02/08/2012 0620         RADIOGRAPHY: Dg Chest 2 View  02/08/2012  *RADIOLOGY REPORT*  Clinical Data: Preoperative chest radiograph for Port-A-Cath insertion.  CHEST - 2 VIEW  Comparison: Chest radiograph performed 05/06/2009, and CTA of the chest performed 01/05/2012  Findings: The patient's right upper lobe lung mass is better characterized on prior CT and PET / CT.  The lungs are otherwise clear.  Mild peribronchial thickening is likely chronic in nature. No focal consolidation, pleural effusion or pneumothorax is seen.  The cardiomediastinal silhouette is normal in size; calcification is noted within the aortic arch.  Multiple rib deformities reflect the patient's known rib metastasis and also healing right-sided rib fractures.  There is chronic deformity involving the right clavicle.  IMPRESSION:  1.  Right upper lobe lung mass again noted.  Lungs otherwise clear. 2.  Multiple rib deformities reflect the patient's known rib metastasis and also healing right-sided rib  fractures.   Original Report Authenticated By: Tonia Ghent, M.D.    Mr Laqueta Jean XB Contrast  01/21/2012  *RADIOLOGY REPORT*  Clinical Data: New diagnosis lung cancer.  Rule out metastatic disease.  MRI HEAD WITHOUT AND WITH CONTRAST  Technique:  Multiplanar, multiecho pulse sequences of the brain and surrounding structures were obtained according to standard protocol without and with intravenous contrast  Contrast: 14mL MULTIHANCE GADOBENATE DIMEGLUMINE 529 MG/ML IV SOLN  Comparison: MRI 06/15/2008  Findings: Nonenhancing white matter hyperintensities bilaterally have progressed since the prior study.  This is most compatible with chronic microvascular ischemia.  There also is mild chronic microvascular ischemia in the pons.  No acute infarct.  There is mild atrophy.  No mass or edema.  No enhancing lesions are seen following contrast administration.  Diffusion weighted imaging is negative.  Small area of hemorrhage in the left frontal cortex over the convexity appears chronic and may be due to hypertension or trauma.  Mild mucosal thickening left maxillary sinus.  Remaining sinuses are clear.  IMPRESSION: Atrophy and chronic microvascular ischemia which has progressed since 2010.  No acute infarct.  Negative for metastatic disease to the brain.   Original Report Authenticated By: Janeece Riggers, M.D.    Nm Pet Image Initial (pi) Skull Base To Thigh  01/20/2012  *RADIOLOGY REPORT*  Clinical Data: Initial treatment strategy for lung mass.  NUCLEAR MEDICINE PET SKULL BASE TO THIGH  Fasting Blood Glucose:  96  Technique:  19.6 mCi F-18 FDG was injected intravenously. CT data was obtained and used for attenuation correction and anatomic localization only.  (This was not acquired as a diagnostic CT examination.) Additional exam technical data entered on technologist worksheet.  Comparison:  Chest CT 01/05/2012  Findings:  Neck: No hypermetabolic lymph nodes in the neck.  Chest:  There is an intensely hypermetabolic  right upper lobe mass which measures 31 mm with SUV max = 15.3.  No additional suspicious nodules.  No hypermetabolic mediastinal lymph nodes.  There is a left lateral rib metastasis (image 96) with the soft tissue extension into the pleural space.  Abdomen/Pelvis:  No abnormal hypermetabolic activity within the liver, pancreas, adrenal glands, or spleen.  No hypermetabolic lymph nodes in the abdomen or pelvis. There are bilateral renal cysts without hypermetabolic activity.  Skeleton:  There multiple intensely hypermetabolic lytic lesions in the pelvis, ribs, and left scapula.  Exemplary lesion in the right iliac bone erodes the cortex (image 186) and is intensely hypermetabolic with SUV max = 10.7.  Hypermetabolic lesions within the right sacra ala. Right iliac bone lesion noted.  There is lesion in the body of the scapula on the left (image 92) . Bilateral single rib lesions.  IMPRESSION:  1.  Hypermetabolic right upper lobe mass consistent with primary bronchogenic carcinoma.  2.  No evidence of mediastinal nodal metastasis. 3.  Widespread skeletal metastasis with a lytic hypermetabolic lesions within the pelvis, ribs, and scapula.   Original Report Authenticated By: Genevive Bi, M.D.    Ct Biopsy  01/26/2012  *RADIOLOGY REPORT*  Indication: Presumed metastatic lung cancer, now with hypermetabolic left iliac lytic lesion.  CT GUIDED CORE NEEDLE BIOPSY OF LEFT ILIAC LYTIC LESION  Comparisons: PET CT - 01/20/2012; chest CT - 01/05/2012  Medications: Fentanyl 100 mcg IV; Versed 2 mg IV  Contrast: None  Sedation time: 20 minutes  CT Fluoroscopy time: 4 seconds  Complications: None immediate  TECHNIQUE/FINDINGS:  Informed consent was obtained from the patient following an explanation of the procedure, risks, benefits and alternatives.  A time out was performed prior to the initiation of the procedure.  The patient was positioned prone,  slightly LPO on the CT table and a limited CT was performed for procedural  planning demonstrating the approximately 2.6 x 1.7 cm lytic lesion involving the left iliac wing (image 7, series 2).  The procedure was planned.  The operative site was prepped and draped in the usual sterile fashion. Appropriate trajectory was confirmed with a 22 gauge spinal needle after the adjacent tissues were anesthetized with 1% Lidocaine with epinephrine.  Under intermittent CT guidance, a 17 gauge coaxial needle was advanced into the peripheral aspect of the lytic lesion. Positioning was confirmed and 5 core needle biopsies were obtained with an 18 gauge core needle biopsy device.  The co-axial needle was removed and hemostasis was achieved with manual compression.  A limited post procedural CT was negative for hemorrhage or additional complication.  A dressing was placed.  The patient tolerated the procedure well without immediate postprocedural complication.  IMPRESSION:  Technically successful CT guided core needle biopsy of dominant hypermetabolic lytic lesion involving the posterior aspect of the left iliac wing.   Original Report Authenticated By: Tacey Ruiz, MD     IMPRESSION/PLAN: This is a delightful 78 year old gentleman with stage IV metastatic squamous cell carcinoma, felt to be of lung primary. He has quit smoking since diagnosis. He started weekly chemotherapy yesterday in the form of upper axilla 18 and carboplatin. He is an excellent candidate for palliative radiotherapy - I plan to treat the patient's right hip and sacrum to 30 Gray in 10 fractions. I will also treat his left scapula and left rib metastasis to 8 Gray in 1 fraction. We will  simulate him today for treatment planning, anticipating that his first fraction of radiotherapy will be given on Monday, December 16.    We discussed the risks benefits and side effects of palliative radiotherapy to these sites. These side effects include but are not necessarily limited to skin irritation and fatigue. There is some chance of mild  bone marrow depression but I will try to keep my fields relatively small to decrease the amount of bone marrow that is treated to that he can tolerate his chemotherapy. We also talked about GI upset and irritation of the pelvic organs. A consent form has been signed and placed in his chart. I very much look forward to participating in his care. He and his wife are enthusiastic about proceeding.  I spent 60 minutes minutes face to face with the patient and more than 50% of that time was spent in counseling and/or coordination of care.   __________________________________________   Lonie Peak, MD

## 2012-02-16 NOTE — Progress Notes (Signed)
Please see the Nurse Progress Note in the MD Initial Consult Encounter for this patient. 

## 2012-02-16 NOTE — Addendum Note (Signed)
Encounter addended by: Delynn Flavin, RN on: 02/16/2012  4:45 PM<BR>     Documentation filed: Charges VN

## 2012-02-17 ENCOUNTER — Telehealth: Payer: Self-pay | Admitting: Medical Oncology

## 2012-02-17 ENCOUNTER — Other Ambulatory Visit: Payer: Self-pay | Admitting: Dermatology

## 2012-02-17 NOTE — Addendum Note (Signed)
Encounter addended by: Britini Garcilazo Mintz Mikalia Fessel, RN on: 02/17/2012  7:58 PM<BR>     Documentation filed: Charges VN

## 2012-02-17 NOTE — Telephone Encounter (Signed)
Britta Mccreedy wanted to give Dr Arbutus Ped this update

## 2012-02-18 ENCOUNTER — Ambulatory Visit: Payer: Medicare Other | Admitting: Physician Assistant

## 2012-02-18 ENCOUNTER — Ambulatory Visit: Payer: Medicare Other | Admitting: Internal Medicine

## 2012-02-18 ENCOUNTER — Ambulatory Visit: Payer: Medicare Other

## 2012-02-18 ENCOUNTER — Other Ambulatory Visit: Payer: Medicare Other | Admitting: Lab

## 2012-02-18 NOTE — Progress Notes (Signed)
Joel Gibson returns for removal of 2 sutures from the upper chest above the new right PORT-A-CATH insertion site.  Both areas are very well healed.  He will return prn and continue under the care of Dr. Arbutus Ped.  This is a late entry for 02/16/11.

## 2012-02-19 ENCOUNTER — Encounter: Payer: Self-pay | Admitting: Radiation Oncology

## 2012-02-19 ENCOUNTER — Ambulatory Visit
Admission: RE | Admit: 2012-02-19 | Discharge: 2012-02-19 | Disposition: A | Discharge status: Home / Self Care | Payer: Medicare Other | Source: Ambulatory Visit | Attending: Radiation Oncology | Admitting: Radiation Oncology

## 2012-02-19 NOTE — Progress Notes (Signed)
Simulation Verification Note: Left Scapula/Rib and Pelvis  The patient was brought to the treatment unit and placed in the planned treatment position. The clinical setup was verified. Then port films were obtained and uploaded to the radiation oncology medical record software.  The treatment beams were carefully compared against the planned radiation fields. The position location and shape of the radiation fields was reviewed. They targeted volume of tissue appears to be appropriately covered by the radiation beams. Organs at risk appear to be excluded as planned.  Based on my personal review, I approved the simulation verification. The patient's treatment will proceed as planned.  -----------------------------------  Lonie Peak, MD

## 2012-02-22 ENCOUNTER — Encounter: Payer: Self-pay | Admitting: Radiation Oncology

## 2012-02-22 ENCOUNTER — Ambulatory Visit
Admission: RE | Admit: 2012-02-22 | Discharge: 2012-02-22 | Disposition: A | Discharge status: Home / Self Care | Payer: Medicare Other | Source: Ambulatory Visit | Attending: Radiation Oncology | Admitting: Radiation Oncology

## 2012-02-22 ENCOUNTER — Ambulatory Visit (HOSPITAL_BASED_OUTPATIENT_CLINIC_OR_DEPARTMENT_OTHER): Payer: Medicare Other

## 2012-02-22 ENCOUNTER — Other Ambulatory Visit (HOSPITAL_BASED_OUTPATIENT_CLINIC_OR_DEPARTMENT_OTHER): Payer: Medicare Other | Admitting: Lab

## 2012-02-22 ENCOUNTER — Ambulatory Visit (HOSPITAL_BASED_OUTPATIENT_CLINIC_OR_DEPARTMENT_OTHER): Payer: Medicare Other | Admitting: Internal Medicine

## 2012-02-22 VITALS — BP 150/70

## 2012-02-22 VITALS — BP 130/66 | HR 84 | Temp 97.9°F | Resp 18 | Ht 67.0 in | Wt 150.4 lb

## 2012-02-22 VITALS — BP 108/52 | HR 88 | Resp 18 | Wt 155.0 lb

## 2012-02-22 DIAGNOSIS — C7951 Secondary malignant neoplasm of bone: Secondary | ICD-10-CM

## 2012-02-22 DIAGNOSIS — C3410 Malignant neoplasm of upper lobe, unspecified bronchus or lung: Secondary | ICD-10-CM

## 2012-02-22 DIAGNOSIS — C3490 Malignant neoplasm of unspecified part of unspecified bronchus or lung: Secondary | ICD-10-CM

## 2012-02-22 DIAGNOSIS — Z5111 Encounter for antineoplastic chemotherapy: Secondary | ICD-10-CM

## 2012-02-22 LAB — CBC WITH DIFFERENTIAL/PLATELET
Basophils Absolute: 0 10*3/uL (ref 0.0–0.1)
Eosinophils Absolute: 0.2 10*3/uL (ref 0.0–0.5)
HGB: 13.6 g/dL (ref 13.0–17.1)
LYMPH%: 24.1 % (ref 14.0–49.0)
MCV: 91.3 fL (ref 79.3–98.0)
MONO%: 6.1 % (ref 0.0–14.0)
NEUT#: 3 10*3/uL (ref 1.5–6.5)
NEUT%: 65.7 % (ref 39.0–75.0)
Platelets: 158 10*3/uL (ref 140–400)

## 2012-02-22 LAB — COMPREHENSIVE METABOLIC PANEL (CC13)
CO2: 28 mEq/L (ref 22–29)
Creatinine: 1.1 mg/dL (ref 0.7–1.3)
Glucose: 78 mg/dl (ref 70–99)
Total Bilirubin: 0.55 mg/dL (ref 0.20–1.20)

## 2012-02-22 MED ORDER — SODIUM CHLORIDE 0.9 % IJ SOLN
10.0000 mL | INTRAMUSCULAR | Status: DC | PRN
  Administered 2012-02-22: 10 mL
  Filled 2012-02-22: qty 10

## 2012-02-22 MED ORDER — ONDANSETRON 8 MG/50ML IVPB (CHCC)
8.0000 mg | Freq: Once | INTRAVENOUS | Status: AC
  Administered 2012-02-22: 8 mg via INTRAVENOUS

## 2012-02-22 MED ORDER — HEPARIN SOD (PORK) LOCK FLUSH 100 UNIT/ML IV SOLN
500.0000 [IU] | Freq: Once | INTRAVENOUS | Status: AC | PRN
Start: 2012-02-22 — End: 2012-02-22
  Administered 2012-02-22: 500 [IU]
  Filled 2012-02-22: qty 5

## 2012-02-22 MED ORDER — DEXAMETHASONE SODIUM PHOSPHATE 10 MG/ML IJ SOLN
10.0000 mg | Freq: Once | INTRAMUSCULAR | Status: AC
  Administered 2012-02-22: 10 mg via INTRAVENOUS

## 2012-02-22 MED ORDER — PACLITAXEL PROTEIN-BOUND CHEMO INJECTION 100 MG
100.0000 mg/m2 | Freq: Once | INTRAVENOUS | Status: AC
  Administered 2012-02-22: 175 mg via INTRAVENOUS
  Filled 2012-02-22: qty 35

## 2012-02-22 MED ORDER — SODIUM CHLORIDE 0.9 % IV SOLN
Freq: Once | INTRAVENOUS | Status: AC
  Administered 2012-02-22: 14:00:00 via INTRAVENOUS

## 2012-02-22 NOTE — Progress Notes (Signed)
[facility] [phone]) [phone]   [phone]  OFFICE PROGRESS NOTE  [name], MD [address] [facility], [place]. 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  PRIOR THERAPY: None  CURRENT THERAPY: 1) systemic chemotherapy with carboplatin for AUC of 6 on day 1 and Abraxane 100 mg/M2 on days 1, 8 and 15 every 3 weeks. The patient is status post 1 weekly treatment. 2) palliative radiotherapy to the right hip and sacrum under the care of Dr. Basilio Cairo.  INTERVAL HISTORY: Joel Gibson 78 y.o. male returns to the clinic today for followup visit accompanied his wife. The patient is feeling fine today with no specific complaints. He denied having any significant pain. He is currently on MS Contin 15 mg by mouth every 12 hours and he did not require any breakthrough pain medications. He is currently undergoing palliative radiotherapy to the right hip and sacrum under the care of Dr. Basilio Cairo and tolerating it fairly well. The patient is also tolerating her systemic chemotherapy with carboplatin and Abraxane fairly well with no significant adverse effects. He denied having any significant nausea or vomiting, no fever or chills. The patient denied having any significant weight loss or night sweats. He denied having any chest pain, shortness breath, cough or hemoptysis.  MEDICAL HISTORY: Past Medical History  Diagnosis Date  . Hyperlipidemia   . Hypertension   . Aortic stenosis     mild to moderate  . Gastritis   . COPD (chronic obstructive pulmonary disease)   . Bronchitis   . Hepatitis     possible  . Hx of cataract surgery     lt eye  . Tobacco abuse   . Lung cancer 02/02/2012  . Shortness of breath     with activity  . Retinal vein occlusion 1990  . GERD (gastroesophageal reflux disease)   . Hemorrhoid   . Aortic  stenosis   . Hypertension   . Dyslipidemia   . Glaucoma   . Right rib fracture      Pathological Fracture / 4 weeks ago  . Lung mass     Right Upper Lobe - Lobular Mass  . Bone metastases     Widespread/ Lytic Lesion Left 7t Rib, Left Iliac Pelvis, ribs and scapula  . SOB (shortness of breath)     Mild  . Cough   . Stroke 2002    Right slight difference in vision    ALLERGIES:   has no known allergies.  MEDICATIONS:  Current Outpatient Prescriptions  Medication Sig Dispense Refill  . albuterol (PROVENTIL HFA;VENTOLIN HFA) 108 (90 BASE) MCG/ACT inhaler Inhale 2 puffs into the lungs every 4 (four) hours as needed.      Marland Kitchen amLODipine (NORVASC) 10 MG tablet Take 10 mg by mouth daily.        Marland Kitchen aspirin 81 MG tablet Take 81 mg by mouth daily.      . Aspirin-Dipyridamole (AGGRENOX PO) Take 25-200 mg by mouth 2 (two) times daily. Takes 2 tabs one day alt. with 1 tab next day,etc.      . betaxolol (BETOPTIC-S) 0.25 % ophthalmic suspension Place 1 drop into both eyes daily. One drop in the left eye in the am....one drop in the right eye in the am and in the pm.      . HYDROcodone-homatropine (HYCODAN) 5-1.5 MG/5ML syrup Take 5 mLs  by mouth every 6 (six) hours as needed.      . irbesartan (AVAPRO) 300 MG tablet Take 300 mg by mouth daily.      Marland Kitchen lidocaine-prilocaine (EMLA) cream Apply topically as needed.  30 g  0  . morphine (MS CONTIN) 15 MG 12 hr tablet Take 1 tablet (15 mg total) by mouth 2 (two) times daily.  60 tablet  0  . Multiple Vitamin (MULTIVITAMIN PO) Take 1 tablet by mouth daily.        . niacin (NIASPAN) 500 MG CR tablet Take 500 mg by mouth at bedtime.       . prochlorperazine (COMPAZINE) 10 MG tablet Take 10 mg by mouth every 6 (six) hours as needed.      . Ranitidine HCl ([employer] RANITIDINE PO) Take 150 mg by mouth daily.       . rosuvastatin (CRESTOR) 40 MG tablet Take 20 mg by mouth daily.       Marland Kitchen senna (SENOKOT) 8.6 MG TABS Take 1 tablet by mouth.      . tiotropium (SPIRIVA)  18 MCG inhalation capsule Place 18 mcg into inhaler and inhale daily.          SURGICAL HISTORY:  Past Surgical History  Procedure Date  . Vasectomy   . Tonsillectomy   . Leg surgery     rt for nerve impingement  . Collarbone fracture     1977  . Tonsillectomy   . Eye surgery     catarct bil  . Portacath placement 02/08/2012    Procedure: INSERTION PORT-A-CATH;  Surgeon: [name], MD;  Location: [facility] OR;  Service: Thoracic;  Laterality: Right;  . Ct guided core biopsy 01/26/12    Left Lytic Lesion - Metastatic Squamous Cell Carcinoma  . Skin biopsy 02/09/12    Right Upper Back, shave : Melanoma In Situ Arising  From a Dysplastic Nevus    REVIEW OF SYSTEMS:  A comprehensive review of systems was negative.   PHYSICAL EXAMINATION: General appearance: alert, cooperative and no distress Head: Normocephalic, without obvious abnormality, atraumatic Neck: no adenopathy Lymph nodes: Cervical, supraclavicular, and axillary nodes normal. Resp: clear to auscultation bilaterally Cardio: regular rate and rhythm, S1, S2 normal, no murmur, click, rub or gallop GI: soft, non-tender; bowel sounds normal; no masses,  no organomegaly Extremities: extremities normal, atraumatic, no cyanosis or edema  ECOG PERFORMANCE STATUS: 1 - Symptomatic but completely ambulatory  Blood pressure 130/66, pulse 84, temperature 97.9 F (36.6 C), temperature source Oral, resp. rate 18, height 5\' 7"  (1.702 m), weight 150 lb 6.4 oz (68.221 kg).  LABORATORY DATA: Lab Results  Component Value Date   WBC 8.5 02/15/2012   HGB 15.4 02/15/2012   HCT 45.4 02/15/2012   MCV 91.9 02/15/2012   PLT 216 02/15/2012      Chemistry      Component Value Date/Time   NA 141 02/15/2012 1041   NA 144 02/08/2012 0620   K 4.1 02/15/2012 1041   K 4.6 02/08/2012 0620   CL 105 02/15/2012 1041   CL 106 02/08/2012 0620   CO2 26 02/15/2012 1041   CO2 27 02/08/2012 0620   BUN 16.0 02/15/2012 1041   BUN 20 02/08/2012 0620   CREATININE  1.0 02/15/2012 1041   CREATININE 1.16 02/08/2012 0620      Component Value Date/Time   CALCIUM 9.6 02/15/2012 1041   CALCIUM 9.6 02/08/2012 0620   ALKPHOS 103 02/15/2012 1041   ALKPHOS 90 02/08/2012 1610  AST 22 02/15/2012 1041   AST 17 02/08/2012 0620   ALT 23 02/15/2012 1041   ALT 15 02/08/2012 0620   BILITOT 0.49 02/15/2012 1041   BILITOT 0.3 02/08/2012 0620       RADIOGRAPHIC STUDIES: Dg Chest 2 View  02/08/2012  *RADIOLOGY REPORT*  Clinical Data: Preoperative chest radiograph for Port-A-Cath insertion.  CHEST - 2 VIEW  Comparison: Chest radiograph performed 05/06/2009, and CTA of the chest performed 01/05/2012  Findings: The patient's right upper lobe lung mass is better characterized on prior CT and PET / CT.  The lungs are otherwise clear.  Mild peribronchial thickening is likely chronic in nature. No focal consolidation, pleural effusion or pneumothorax is seen.  The cardiomediastinal silhouette is normal in size; calcification is noted within the aortic arch.  Multiple rib deformities reflect the patient's known rib metastasis and also healing right-sided rib fractures.  There is chronic deformity involving the right clavicle.  IMPRESSION:  1.  Right upper lobe lung mass again noted.  Lungs otherwise clear. 2.  Multiple rib deformities reflect the patient's known rib metastasis and also healing right-sided rib fractures.   Original Report Authenticated By: Tonia Ghent, M.D.    Ct Biopsy  01/26/2012  *RADIOLOGY REPORT*  Indication: Presumed metastatic lung cancer, now with hypermetabolic left iliac lytic lesion.  CT GUIDED CORE NEEDLE BIOPSY OF LEFT ILIAC LYTIC LESION  Comparisons: PET CT - 01/20/2012; chest CT - 01/05/2012  Medications: Fentanyl 100 mcg IV; Versed 2 mg IV  Contrast: None  Sedation time: 20 minutes  CT Fluoroscopy time: 4 seconds  Complications: None immediate  TECHNIQUE/FINDINGS:  Informed consent was obtained from the patient following an explanation of the procedure, risks,  benefits and alternatives.  A time out was performed prior to the initiation of the procedure.  The patient was positioned prone, slightly LPO on the CT table and a limited CT was performed for procedural planning demonstrating the approximately 2.6 x 1.7 cm lytic lesion involving the left iliac wing (image 7, series 2).  The procedure was planned.  The operative site was prepped and draped in the usual sterile fashion. Appropriate trajectory was confirmed with a 22 gauge spinal needle after the adjacent tissues were anesthetized with 1% Lidocaine with epinephrine.  Under intermittent CT guidance, a 17 gauge coaxial needle was advanced into the peripheral aspect of the lytic lesion. Positioning was confirmed and 5 core needle biopsies were obtained with an 18 gauge core needle biopsy device.  The co-axial needle was removed and hemostasis was achieved with manual compression.  A limited post procedural CT was negative for hemorrhage or additional complication.  A dressing was placed.  The patient tolerated the procedure well without immediate postprocedural complication.  IMPRESSION:  Technically successful CT guided core needle biopsy of dominant hypermetabolic lytic lesion involving the posterior aspect of the left iliac wing.   Original Report Authenticated By: Tacey Ruiz, MD    Dg Chest Port 1 View  02/08/2012  *RADIOLOGY REPORT*  Clinical Data: Port-A-Cath placement.  PORTABLE CHEST - 1 VIEW  Comparison: Pre procedure film of earlier in the day.  PET of 01/20/2012.  Chest CT of 01/05/2012.  Findings: Right-sided Port-A-Cath terminates at the mid to low SVC. No pneumothorax.  Normal heart size.  No pleural fluid.  Right upper lobe lung mass again identified.  Bilateral rib fractures and metastasis.  IMPRESSION: Right-sided Port-A-Cath terminating at the mid to low SVC; no pneumothorax.  Right upper lobe lung mass, as before.  Original Report Authenticated By: Jeronimo Greaves, M.D.    Dg Fluoro Guide Cv  Line-no Report  02/08/2012  CLINICAL DATA: needed for port a cath insertion   FLOURO GUIDE CV LINE  Fluoroscopy was utilized by the requesting physician.  No radiographic  interpretation.      ASSESSMENT: This is a very pleasant 78 years old white male with metastatic non-small cell lung cancer, squamous cell carcinoma currently undergoing systemic chemotherapy with carboplatin and Abraxane. The patient is tolerating his treatment fairly well.  PLAN: We'll proceed with day 8 of the first cycle today as scheduled. He will continue on palliative radiotherapy to the right hip and sacrum under the care of Dr. Basilio Cairo. The patient would come back for followup visit in 2 weeks for evaluation before starting cycle #2 of his chemotherapy. He was advised to call immediately if he has any concerning symptoms in the interval. All questions were answered. The patient knows to call the clinic with any problems, questions or concerns. We can certainly see the patient much sooner if necessary.  I spent 15 minutes counseling the patient face to face. The total time spent in the appointment was 25 minutes.

## 2012-02-22 NOTE — Progress Notes (Signed)
   Weekly Management Note:  Outpatient Current Dose:  8/8 Gy  to scapula/rib; 3/30 Gy to pelvis  Narrative:  The patient presents for routine under treatment assessment.  CBCT/MVCT images/Port film x-rays were reviewed.  The chart was checked. He denies any significant pain today. His pain is 2/10 in his right hip. He reports a small ulcer in the upper gluteal fold which his wife noticed in the past few days.  Physical Findings:  weight is 155 lb (70.308 kg). His blood pressure is 108/52 and his pulse is 88. His respiration is 18.  Small ulcer, minimally moist, in upper gluteal fold.  Impression:  The patient is tolerating radiotherapy. Ulcer unrelated to radiotherapy (preceded treatment). The patient acknowledges he may be more sedentary than usual.  Plan:  Continue radiotherapy as planned. I will look at his plan and see if we need to revise the lateral beams to decrease skin dose to the gluteal fold. Recommended Neosporin over the ulcer.  ________________________________   Lonie Peak, M.D.

## 2012-02-22 NOTE — Patient Instructions (Signed)
Continue with day 8 of the first cycle as scheduled today. Followup in 2 weeks

## 2012-02-22 NOTE — Patient Instructions (Addendum)
[facility] Discharge Instructions for Patients Receiving Chemotherapy  Today you received the following chemotherapy agents Abraxane  To help prevent nausea and vomiting after your treatment, we encourage you to take your nausea medication as prescribed.   If you develop nausea and vomiting that is not controlled by your nausea medication, call the clinic. If it is after clinic hours your family physician or the after hours number for the clinic or go to the Emergency Department.   BELOW ARE SYMPTOMS THAT SHOULD BE REPORTED IMMEDIATELY:  *FEVER GREATER THAN 100.5 F  *CHILLS WITH OR WITHOUT FEVER  NAUSEA AND VOMITING THAT IS NOT CONTROLLED WITH YOUR NAUSEA MEDICATION  *UNUSUAL SHORTNESS OF BREATH  *UNUSUAL BRUISING OR BLEEDING  TENDERNESS IN MOUTH AND THROAT WITH OR WITHOUT PRESENCE OF ULCERS  *URINARY PROBLEMS  *BOWEL PROBLEMS  UNUSUAL RASH Items with * indicate a potential emergency and should be followed up as soon as possible.  Feel free to call the clinic you have any questions or concerns. The clinic phone number is [phone].   I have been informed and understand all the instructions given to me. I know to contact the clinic, my physician, or go to the Emergency Department if any problems should occur. I do not have any questions at this time, but understand that I may call the clinic during office hours   should I have any questions or need assistance in obtaining follow up care.    __________________________________________  _____________  __________ Signature of Patient or Authorized Representative            Date                   Time    __________________________________________ Nurse's Signature    

## 2012-02-22 NOTE — Progress Notes (Signed)
Patient presents to the clinic today accompanied by his wife for a PUT with Dr. Basilio Cairo. Patient is alert and oriented to person, place, and time. No distress noted. Steady gait noted. Pleasant affect noted. Patient reports right hip pain 2 on a scale of 0-10. Patient reports that he is feeling much better and in fact didn't have to take morphine or anti emetic this morphine. Patient describes right hip pain similar to nerve pain that radiates to right ankle. Blood pressure lower than normal this morning. Patient reports taking bp medication this morning. Patient denies headache, dizziness, or lightheadedness. Reported all findings to Dr. Basilio Cairo.

## 2012-02-23 ENCOUNTER — Ambulatory Visit
Admission: RE | Admit: 2012-02-23 | Discharge: 2012-02-23 | Disposition: A | Discharge status: Home / Self Care | Payer: Medicare Other | Source: Ambulatory Visit | Attending: Radiation Oncology | Admitting: Radiation Oncology

## 2012-02-24 ENCOUNTER — Telehealth: Payer: Self-pay | Admitting: *Deleted

## 2012-02-24 ENCOUNTER — Telehealth: Payer: Self-pay | Admitting: Internal Medicine

## 2012-02-24 ENCOUNTER — Ambulatory Visit
Admission: RE | Admit: 2012-02-24 | Discharge: 2012-02-24 | Disposition: A | Discharge status: Home / Self Care | Payer: Medicare Other | Source: Ambulatory Visit | Attending: Radiation Oncology | Admitting: Radiation Oncology

## 2012-02-24 NOTE — Telephone Encounter (Signed)
Per staff message and POF I have scheduled appts.  JMW  

## 2012-02-24 NOTE — Telephone Encounter (Signed)
S/W THE PT'S WIFE AND SHE IS AWARE TO PICK UP AN APPT CALENDAR FOR DEC AND JAN 2014. SENT MICHELLE A STAFF MESSAGE TO ADD SOME MORE CHEMO APPTS

## 2012-02-24 NOTE — Telephone Encounter (Signed)
appts schedueld

## 2012-02-25 ENCOUNTER — Ambulatory Visit: Payer: Medicare Other

## 2012-02-25 ENCOUNTER — Other Ambulatory Visit: Payer: Medicare Other | Admitting: Lab

## 2012-02-25 ENCOUNTER — Ambulatory Visit
Admission: RE | Admit: 2012-02-25 | Discharge: 2012-02-25 | Disposition: A | Discharge status: Home / Self Care | Payer: Medicare Other | Source: Ambulatory Visit | Attending: Radiation Oncology | Admitting: Radiation Oncology

## 2012-02-26 ENCOUNTER — Ambulatory Visit
Admission: RE | Admit: 2012-02-26 | Discharge: 2012-02-26 | Disposition: A | Discharge status: Home / Self Care | Payer: Medicare Other | Source: Ambulatory Visit | Attending: Radiation Oncology | Admitting: Radiation Oncology

## 2012-02-29 ENCOUNTER — Other Ambulatory Visit: Payer: Self-pay | Admitting: Radiation Oncology

## 2012-02-29 ENCOUNTER — Telehealth: Payer: Self-pay | Admitting: *Deleted

## 2012-02-29 ENCOUNTER — Encounter: Payer: Self-pay | Admitting: Radiation Oncology

## 2012-02-29 ENCOUNTER — Other Ambulatory Visit (HOSPITAL_BASED_OUTPATIENT_CLINIC_OR_DEPARTMENT_OTHER): Payer: Medicare Other | Admitting: Lab

## 2012-02-29 ENCOUNTER — Ambulatory Visit: Payer: Medicare Other

## 2012-02-29 ENCOUNTER — Ambulatory Visit
Admission: RE | Admit: 2012-02-29 | Discharge: 2012-02-29 | Disposition: A | Discharge status: Home / Self Care | Payer: Medicare Other | Source: Ambulatory Visit | Attending: Radiation Oncology | Admitting: Radiation Oncology

## 2012-02-29 VITALS — BP 107/50 | HR 86 | Temp 97.6°F | Wt 152.3 lb

## 2012-02-29 DIAGNOSIS — C7951 Secondary malignant neoplasm of bone: Secondary | ICD-10-CM

## 2012-02-29 DIAGNOSIS — C3410 Malignant neoplasm of upper lobe, unspecified bronchus or lung: Secondary | ICD-10-CM

## 2012-02-29 DIAGNOSIS — C3490 Malignant neoplasm of unspecified part of unspecified bronchus or lung: Secondary | ICD-10-CM

## 2012-02-29 LAB — CBC WITH DIFFERENTIAL/PLATELET
Basophils Absolute: 0 10*3/uL (ref 0.0–0.1)
HCT: 35.6 % — ABNORMAL LOW (ref 38.4–49.9)
HGB: 12.1 g/dL — ABNORMAL LOW (ref 13.0–17.1)
LYMPH%: 32 % (ref 14.0–49.0)
MCH: 30.8 pg (ref 27.2–33.4)
MONO#: 0.4 10*3/uL (ref 0.1–0.9)
NEUT%: 45.8 % (ref 39.0–75.0)
Platelets: 105 10*3/uL — ABNORMAL LOW (ref 140–400)
WBC: 1.8 10*3/uL — ABNORMAL LOW (ref 4.0–10.3)
lymph#: 0.6 10*3/uL — ABNORMAL LOW (ref 0.9–3.3)

## 2012-02-29 NOTE — Progress Notes (Signed)
Per Dr. Arbutus Ped, will hold chemo today.  Pt is to keep appointments as scheduled next week.  Copy of labs and calendar given to pt/wife.  Discussed neutropenic precautions and advised them to call with any signs of fever or infection-dhp, rn

## 2012-02-29 NOTE — Progress Notes (Signed)
Patient here for weekly assessment for radiation to pelvis.Has completed 6 of 10 treatments.Felt bad on yesterday , weak,nauseated and fatigued but feeling much better this morning.Scheduled for lab and infusion today.Weight up almost 2 lbs from 02/22/12.

## 2012-02-29 NOTE — Progress Notes (Signed)
   Weekly Management Note:  Outpatient Current Dose:  18 Gy /30 Gy to pelvis, 8/8 Gy to Left Scapula  Narrative:  The patient presents for routine under treatment assessment.  CBCT/MVCT images/Port film x-rays were reviewed.  The chart was checked. His pain is much better in the right hip and L scapula. Left hip pain a little worse in the joint, but we are treating that area. Was nauseous this weekend - now better. No diarrhea.  Physical Findings:  weight is 152 lb 4.8 oz (69.083 kg). His temperature is 97.6 F (36.4 C). His blood pressure is 107/50 and his pulse is 86. His oxygen saturation is 95%.  Abdomen soft, slightly bloated, nontender.  Sore in gluteal fold is a little better.   CBC    Component Value Date/Time   WBC 4.6 02/22/2012 1240   WBC 8.3 02/08/2012 0620   RBC 4.37 02/22/2012 1240   RBC 4.74 02/08/2012 0620   HGB 13.6 02/22/2012 1240   HGB 15.0 02/08/2012 0620   HCT 39.9 02/22/2012 1240   HCT 43.7 02/08/2012 0620   PLT 158 02/22/2012 1240   PLT 199 02/08/2012 0620   MCV 91.3 02/22/2012 1240   MCV 92.2 02/08/2012 0620   MCH 31.1 02/22/2012 1240   MCH 31.6 02/08/2012 0620   MCHC 34.1 02/22/2012 1240   MCHC 34.3 02/08/2012 0620   RDW 11.8 02/22/2012 1240   RDW 12.1 02/08/2012 0620   LYMPHSABS 1.1 02/22/2012 1240   MONOABS 0.3 02/22/2012 1240   EOSABS 0.2 02/22/2012 1240   BASOSABS 0.0 02/22/2012 1240    CMP     Component Value Date/Time   NA 140 02/22/2012 1239   NA 144 02/08/2012 0620   K 4.1 02/22/2012 1239   K 4.6 02/08/2012 0620   CL 104 02/22/2012 1239   CL 106 02/08/2012 0620   CO2 28 02/22/2012 1239   CO2 27 02/08/2012 0620   GLUCOSE 78 02/22/2012 1239   GLUCOSE 113* 02/08/2012 0620   BUN 25.0 02/22/2012 1239   BUN 20 02/08/2012 0620   CREATININE 1.1 02/22/2012 1239   CREATININE 1.16 02/08/2012 0620   CALCIUM 9.1 02/22/2012 1239   CALCIUM 9.6 02/08/2012 0620   PROT 6.2* 02/22/2012 1239   PROT 6.9 02/08/2012 0620   ALBUMIN 3.6 02/22/2012 1239   ALBUMIN 3.7  02/08/2012 0620   AST 19 02/22/2012 1239   AST 17 02/08/2012 0620   ALT 24 02/22/2012 1239   ALT 15 02/08/2012 0620   ALKPHOS 98 02/22/2012 1239   ALKPHOS 90 02/08/2012 0620   BILITOT 0.55 02/22/2012 1239   BILITOT 0.3 02/08/2012 0620   GFRNONAA 58* 02/08/2012 0620   GFRAA 68* 02/08/2012 0620    Impression:  The patient is tolerating radiotherapy.  Plan:  Continue radiotherapy as planned. F/u 1 month post-RT.  ________________________________   Lonie Peak, M.D.

## 2012-02-29 NOTE — Telephone Encounter (Signed)
Pt's wife had called in am stating that pt was having pain and swelling at one of his molars, was concerned that pt was not to have dental work during chemo. Discussed with Tiana Loft, PA who states it is OK for pt to go to dentist to have molar checked. Left pt/wife a voice mail with this information. To call if has further questions.

## 2012-03-01 ENCOUNTER — Telehealth: Payer: Self-pay | Admitting: Medical Oncology

## 2012-03-01 ENCOUNTER — Telehealth: Payer: Self-pay | Admitting: Radiation Oncology

## 2012-03-01 ENCOUNTER — Ambulatory Visit
Admission: RE | Admit: 2012-03-01 | Discharge: 2012-03-01 | Disposition: A | Discharge status: Home / Self Care | Payer: Medicare Other | Source: Ambulatory Visit | Attending: Radiation Oncology | Admitting: Radiation Oncology

## 2012-03-01 NOTE — Telephone Encounter (Signed)
Asking if he wil get radiaiton today due to low anc- radiation called pt to come in for xrt

## 2012-03-01 NOTE — Telephone Encounter (Signed)
Patient wife phoned concerned radiation was cancelled because chemotherapy was. Explained to the patient's wife that chemo was cancelled because WBC are low. Explained that platelets are high enough the patient is safe to receive radiation therapy. Confirmed this with Dr. Basilio Cairo via phone. She verbalized understanding. She reports he will be present for radiation treatment. Informed Kristy, RT of this finding on L2.

## 2012-03-03 ENCOUNTER — Ambulatory Visit
Admission: RE | Admit: 2012-03-03 | Discharge: 2012-03-03 | Disposition: A | Discharge status: Home / Self Care | Payer: Medicare Other | Source: Ambulatory Visit | Attending: Radiation Oncology | Admitting: Radiation Oncology

## 2012-03-04 ENCOUNTER — Ambulatory Visit
Admission: RE | Admit: 2012-03-04 | Discharge: 2012-03-04 | Disposition: A | Discharge status: Home / Self Care | Payer: Medicare Other | Source: Ambulatory Visit | Attending: Radiation Oncology | Admitting: Radiation Oncology

## 2012-03-07 ENCOUNTER — Ambulatory Visit
Admission: RE | Admit: 2012-03-07 | Discharge: 2012-03-07 | Disposition: A | Discharge status: Home / Self Care | Payer: Medicare Other | Source: Ambulatory Visit | Attending: Radiation Oncology | Admitting: Radiation Oncology

## 2012-03-07 ENCOUNTER — Encounter: Payer: Self-pay | Admitting: Radiation Oncology

## 2012-03-07 ENCOUNTER — Ambulatory Visit (HOSPITAL_BASED_OUTPATIENT_CLINIC_OR_DEPARTMENT_OTHER): Payer: Medicare Other | Admitting: Physician Assistant

## 2012-03-07 ENCOUNTER — Other Ambulatory Visit (HOSPITAL_BASED_OUTPATIENT_CLINIC_OR_DEPARTMENT_OTHER): Payer: Medicare Other | Admitting: Lab

## 2012-03-07 ENCOUNTER — Encounter: Payer: Self-pay | Admitting: Physician Assistant

## 2012-03-07 ENCOUNTER — Telehealth: Payer: Self-pay | Admitting: Internal Medicine

## 2012-03-07 ENCOUNTER — Ambulatory Visit (HOSPITAL_BASED_OUTPATIENT_CLINIC_OR_DEPARTMENT_OTHER): Payer: Medicare Other

## 2012-03-07 ENCOUNTER — Telehealth: Payer: Self-pay | Admitting: *Deleted

## 2012-03-07 VITALS — BP 136/61 | HR 77 | Resp 18 | Wt 152.8 lb

## 2012-03-07 VITALS — BP 136/61 | HR 77 | Temp 96.8°F | Resp 18 | Ht 67.0 in | Wt 153.8 lb

## 2012-03-07 DIAGNOSIS — C3410 Malignant neoplasm of upper lobe, unspecified bronchus or lung: Secondary | ICD-10-CM

## 2012-03-07 DIAGNOSIS — C7952 Secondary malignant neoplasm of bone marrow: Secondary | ICD-10-CM

## 2012-03-07 DIAGNOSIS — D6959 Other secondary thrombocytopenia: Secondary | ICD-10-CM

## 2012-03-07 DIAGNOSIS — C3490 Malignant neoplasm of unspecified part of unspecified bronchus or lung: Secondary | ICD-10-CM

## 2012-03-07 DIAGNOSIS — Z5111 Encounter for antineoplastic chemotherapy: Secondary | ICD-10-CM

## 2012-03-07 DIAGNOSIS — C7951 Secondary malignant neoplasm of bone: Secondary | ICD-10-CM

## 2012-03-07 LAB — CBC WITH DIFFERENTIAL/PLATELET
Basophils Absolute: 0 10*3/uL (ref 0.0–0.1)
Eosinophils Absolute: 0 10*3/uL (ref 0.0–0.5)
HGB: 11.9 g/dL — ABNORMAL LOW (ref 13.0–17.1)
LYMPH%: 19.4 % (ref 14.0–49.0)
MCV: 89.9 fL (ref 79.3–98.0)
MONO%: 5.4 % (ref 0.0–14.0)
NEUT#: 3.7 10*3/uL (ref 1.5–6.5)
NEUT%: 74.2 % (ref 39.0–75.0)
Platelets: 94 10*3/uL — ABNORMAL LOW (ref 140–400)

## 2012-03-07 LAB — COMPREHENSIVE METABOLIC PANEL (CC13)
BUN: 20 mg/dL (ref 7.0–26.0)
CO2: 26 mEq/L (ref 22–29)
Creatinine: 1.2 mg/dL (ref 0.7–1.3)
Glucose: 219 mg/dl — ABNORMAL HIGH (ref 70–99)
Total Bilirubin: 0.3 mg/dL (ref 0.20–1.20)
Total Protein: 5.7 g/dL — ABNORMAL LOW (ref 6.4–8.3)

## 2012-03-07 MED ORDER — PACLITAXEL PROTEIN-BOUND CHEMO INJECTION 100 MG
100.0000 mg/m2 | Freq: Once | INTRAVENOUS | Status: AC
  Administered 2012-03-07: 175 mg via INTRAVENOUS
  Filled 2012-03-07: qty 35

## 2012-03-07 MED ORDER — SODIUM CHLORIDE 0.9 % IV SOLN
399.0000 mg | Freq: Once | INTRAVENOUS | Status: AC
  Administered 2012-03-07: 400 mg via INTRAVENOUS
  Filled 2012-03-07: qty 40

## 2012-03-07 MED ORDER — SODIUM CHLORIDE 0.9 % IJ SOLN
10.0000 mL | INTRAMUSCULAR | Status: DC | PRN
  Administered 2012-03-07: 10 mL
  Filled 2012-03-07: qty 10

## 2012-03-07 MED ORDER — SODIUM CHLORIDE 0.9 % IV SOLN
Freq: Once | INTRAVENOUS | Status: AC
  Administered 2012-03-07: 15:00:00 via INTRAVENOUS

## 2012-03-07 MED ORDER — DEXAMETHASONE SODIUM PHOSPHATE 10 MG/ML IJ SOLN
20.0000 mg | Freq: Once | INTRAMUSCULAR | Status: AC
  Administered 2012-03-07: 20 mg via INTRAVENOUS

## 2012-03-07 MED ORDER — HEPARIN SOD (PORK) LOCK FLUSH 100 UNIT/ML IV SOLN
500.0000 [IU] | Freq: Once | INTRAVENOUS | Status: AC | PRN
  Administered 2012-03-07: 500 [IU]
  Filled 2012-03-07: qty 5

## 2012-03-07 MED ORDER — ONDANSETRON 16 MG/50ML IVPB (CHCC)
16.0000 mg | Freq: Once | INTRAVENOUS | Status: AC
  Administered 2012-03-07: 16 mg via INTRAVENOUS

## 2012-03-07 NOTE — Telephone Encounter (Signed)
Scheduled lab and ML, chemo on 1/20 /14 needs to be adjusted, emailed Marcelino Duster regarding chemo. Pt will get calendar from Emery @ chemo room

## 2012-03-07 NOTE — Progress Notes (Signed)
OK to treat today despite platelet count per Tiana Loft, PA-dhp, rn

## 2012-03-07 NOTE — Patient Instructions (Addendum)
Continue lab & chemotherapy as scheduled Follow up in 3 weeks prior to the start of your next cycle of chemotherapy

## 2012-03-07 NOTE — Patient Instructions (Signed)
[facility] Discharge Instructions for Patients Receiving Chemotherapy  Today you received the following chemotherapy agents Abraxane and Carboplatin  To help prevent nausea and vomiting after your treatment, we encourage you to take your nausea medication as prescribed.   If you develop nausea and vomiting that is not controlled by your nausea medication, call the clinic. If it is after clinic hours your family physician or the after hours number for the clinic or go to the Emergency Department.   BELOW ARE SYMPTOMS THAT SHOULD BE REPORTED IMMEDIATELY:  *FEVER GREATER THAN 100.5 F  *CHILLS WITH OR WITHOUT FEVER  NAUSEA AND VOMITING THAT IS NOT CONTROLLED WITH YOUR NAUSEA MEDICATION  *UNUSUAL SHORTNESS OF BREATH  *UNUSUAL BRUISING OR BLEEDING  TENDERNESS IN MOUTH AND THROAT WITH OR WITHOUT PRESENCE OF ULCERS  *URINARY PROBLEMS  *BOWEL PROBLEMS  UNUSUAL RASH Items with * indicate a potential emergency and should be followed up as soon as possible.   Feel free to call the clinic you have any questions or concerns. The clinic phone number is [phone].   I have been informed and understand all the instructions given to me. I know to contact the clinic, my physician, or go to the Emergency Department if any problems should occur. I do not have any questions at this time, but understand that I may call the clinic during office hours   should I have any questions or need assistance in obtaining follow up care.

## 2012-03-07 NOTE — Progress Notes (Signed)
  Radiation Oncology         [phone] ________________________________  Name: Joel Gibson MRN: [id number]  Date: 03/07/2012  DOB: 11-16-1931  End of Treatment Note  Diagnosis:   The encounter diagnosis was Bone metastases, metastatic squamous cell carcinoma, consistent with lung primary  Indication for treatment:    palliative  Radiation treatment dates:  02/22/2012-03/07/2012  Site/dose:  1) pelvis and sacrum/30 Gray in 10 fractions 2) left scapula and rib/ 8 Gray in 1 fraction  Beams/energy:   1) three-field/ 15 MV photons 2) opposed obliques / 10 MV photons  Narrative: The patient tolerated radiation treatment relatively well.   He experienced excellent palliation of his pain. He had some loosening of his stools which was addressed with Imodium.  Plan: The patient has completed radiation treatment. The patient will return to radiation oncology clinic for routine followup in one month. I advised them to call or return sooner if they have any questions or concerns related to their recovery or treatment.  -----------------------------------  Lonie Peak, MD

## 2012-03-07 NOTE — Progress Notes (Signed)
Patient presents to the clinic today accompanied by his wife for PUT with Dr. Dayton Scrape. Patient is alert and oriented to person, place, and time. No distress noted. Steady gait noted. Pleasant affect noted. Patient denies pain at this time. Patient reports diarrhea and blood tinged stool over the weekend. Patient reports hemorrhoids are a problem. Encouraged patient to begin taking Imodium OTC if the diarrhea did not resolve. Patient verbalized understanding. Patient reports a gigantic appetite. Weight stable. Patient weighs the same today as he did on 02/29/2012. Patient has a follow up scheduled with Dr. Basilio Cairo for 04/08/2012. Patient for labs today. Hx of low WBC. Reported all findings to Dr. Dayton Scrape.

## 2012-03-07 NOTE — Telephone Encounter (Signed)
Per staff message I have adjusted appt for 1/20.  JMW

## 2012-03-07 NOTE — Progress Notes (Addendum)
Weekly Management Note:  Site: Pelvis Current Dose:  3000  cGy Projected Dose: 3000  cGy  Narrative: The patient is seen today for routine under treatment assessment. CBCT/MVCT images/port films were reviewed. The chart was reviewed.   He states that his pelvic pain is improved although he does have mild suprapubic discomfort. He otherwise feels well and his appetite is excellent. He does have some loosening of his bowels for which she will start Imodium. He had a small amount of blood tinge to over the weekend which he attributed to hemorrhoids.  Physical Examination:  Filed Vitals:   03/07/12 1136  BP: 136/61  Pulse: 77  Resp: 18  .  Weight: 152 lb 12.8 oz (69.31 kg). No change. Abdomen soft.  Impression: Radiation therapy well tolerated.  Plan: Followup visit with Dr. Basilio Cairo on January 31.

## 2012-03-08 ENCOUNTER — Ambulatory Visit: Payer: Medicare Other

## 2012-03-08 NOTE — Progress Notes (Signed)
[facility] [phone]   [phone]  OFFICE PROGRESS NOTE  [name], MD [address] [facility], [place]. 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  PRIOR THERAPY: Status post palliative radiotherapy to the right hip and sacrum under the care of Dr. Basilio Cairo.  CURRENT THERAPY:  systemic chemotherapy with carboplatin for AUC of 6 on day 1 and Abraxane 100 mg/M2 on days 1, 8 and 15 every 3 weeks. The patient is status post 1 cycle   INTERVAL HISTORY: Joel Gibson 78 y.o. male returns to the clinic today for followup visit accompanied his wife. He is tolerating his chemotherapy with carboplatin and Abraxane relatively well. He continues to have some diarrhea which is felt to be related to his recent palliative radiotherapy to the right hip and sacrum. He has noticed for the last 3 mornings a small amount of bright red blood. He does report a history of hemorrhoids. Repeat report some constipation related to the chemotherapy. He reports that Dr. Irene Limbo from [facility] dermatology recently removed a "melanoma" from his right shoulder blade area. According to the patient and his wife the time melanoma was completely excised. Patient also reports that he has a one-year followup with Dr. Caryn Section in mid January for some proteinuria.  He denied having any significant nausea or vomiting, no fever or chills. The patient denied having any significant weight loss or night sweats. He denied having any chest pain, shortness breath, cough or hemoptysis.  MEDICAL HISTORY: Past Medical History  Diagnosis Date  . Hyperlipidemia   . Hypertension   . Aortic stenosis     mild to moderate  . Gastritis   . COPD (chronic obstructive pulmonary disease)   . Bronchitis   . Hepatitis     possible  . Hx of cataract surgery     lt eye    . Tobacco abuse   . Lung cancer 02/02/2012  . Shortness of breath     with activity  . Retinal vein occlusion 1990  . GERD (gastroesophageal reflux disease)   . Hemorrhoid   . Aortic stenosis   . Hypertension   . Dyslipidemia   . Glaucoma   . Right rib fracture      Pathological Fracture / 4 weeks ago  . Lung mass     Right Upper Lobe - Lobular Mass  . Bone metastases     Widespread/ Lytic Lesion Left 7t Rib, Left Iliac Pelvis, ribs and scapula  . SOB (shortness of breath)     Mild  . Cough   . Stroke 2002    Right slight difference in vision    ALLERGIES:   has no known allergies.  MEDICATIONS:  Current Outpatient Prescriptions  Medication Sig Dispense Refill  . albuterol (PROVENTIL HFA;VENTOLIN HFA) 108 (90 BASE) MCG/ACT inhaler Inhale 2 puffs into the lungs every 4 (four) hours as needed.      Marland Kitchen amLODipine (NORVASC) 10 MG tablet Take 10 mg by mouth daily.        Marland Kitchen aspirin 81 MG tablet Take 81 mg by mouth daily.      . Aspirin-Dipyridamole (AGGRENOX PO) Take 25-200 mg by mouth 2 (two) times daily. Takes 2 tabs one day alt. with 1 tab next day,etc.      . betaxolol (BETOPTIC-S) 0.25 % ophthalmic suspension Place 1 drop into both eyes daily.  One drop in the left eye in the am....one drop in the right eye in the am and in the pm.      . HYDROcodone-homatropine (HYCODAN) 5-1.5 MG/5ML syrup Take 5 mLs by mouth every 6 (six) hours as needed.      . irbesartan (AVAPRO) 300 MG tablet Take 300 mg by mouth daily.      Marland Kitchen lidocaine-prilocaine (EMLA) cream Apply topically as needed.  30 g  0  . morphine (MS CONTIN) 15 MG 12 hr tablet Take 1 tablet (15 mg total) by mouth 2 (two) times daily.  60 tablet  0  . Multiple Vitamin (MULTIVITAMIN PO) Take 1 tablet by mouth daily.        . niacin (NIASPAN) 500 MG CR tablet Take 500 mg by mouth at bedtime.       . prochlorperazine (COMPAZINE) 10 MG tablet Take 10 mg by mouth every 6 (six) hours as needed.      . Ranitidine HCl ([employer] RANITIDINE  PO) Take 150 mg by mouth daily.       . rosuvastatin (CRESTOR) 40 MG tablet Take 20 mg by mouth daily.       Marland Kitchen senna (SENOKOT) 8.6 MG TABS Take 1 tablet by mouth.      . tiotropium (SPIRIVA) 18 MCG inhalation capsule Place 18 mcg into inhaler and inhale daily.          SURGICAL HISTORY:  Past Surgical History  Procedure Date  . Vasectomy   . Tonsillectomy   . Leg surgery     rt for nerve impingement  . Collarbone fracture     1977  . Tonsillectomy   . Eye surgery     catarct bil  . Portacath placement 02/08/2012    Procedure: INSERTION PORT-A-CATH;  Surgeon: [name], MD;  Location: [facility] OR;  Service: Thoracic;  Laterality: Right;  . Ct guided core biopsy 01/26/12    Left Lytic Lesion - Metastatic Squamous Cell Carcinoma  . Skin biopsy 02/09/12    Right Upper Back, shave : Melanoma In Situ Arising  From a Dysplastic Nevus    REVIEW OF SYSTEMS:  Pertinent items are noted in HPI.   PHYSICAL EXAMINATION: General appearance: alert, cooperative and no distress Head: Normocephalic, without obvious abnormality, atraumatic Neck: no adenopathy Lymph nodes: Cervical, supraclavicular, and axillary nodes normal. Resp: clear to auscultation bilaterally Cardio: regular rate and rhythm, S1, S2 normal, no murmur, click, rub or gallop GI: soft, non-tender; bowel sounds normal; no masses,  no organomegaly Extremities: extremities normal, atraumatic, no cyanosis or edema  ECOG PERFORMANCE STATUS: 1 - Symptomatic but completely ambulatory  Blood pressure 136/61, pulse 77, temperature 96.8 F (36 C), temperature source Oral, resp. rate 18, height 5\' 7"  (1.702 m), weight 153 lb 12.8 oz (69.763 kg).  LABORATORY DATA: Lab Results  Component Value Date   WBC 5.0 03/07/2012   HGB 11.9* 03/07/2012   HCT 34.8* 03/07/2012   MCV 89.9 03/07/2012   PLT 94* 03/07/2012      Chemistry      Component Value Date/Time   NA 141 03/07/2012 1211   NA 144 02/08/2012 0620   K 3.6 03/07/2012 1211   K  4.6 02/08/2012 0620   CL 106 03/07/2012 1211   CL 106 02/08/2012 0620   CO2 26 03/07/2012 1211   CO2 27 02/08/2012 0620   BUN 20.0 03/07/2012 1211   BUN 20 02/08/2012 0620   CREATININE 1.2 03/07/2012 1211   CREATININE 1.16 02/08/2012  1610      Component Value Date/Time   CALCIUM 7.8 Repeated and Verified* 03/07/2012 1211   CALCIUM 9.6 02/08/2012 0620   ALKPHOS 80 03/07/2012 1211   ALKPHOS 90 02/08/2012 0620   AST 9 03/07/2012 1211   AST 17 02/08/2012 0620   ALT 10 03/07/2012 1211   ALT 15 02/08/2012 0620   BILITOT 0.30 03/07/2012 1211   BILITOT 0.3 02/08/2012 0620       RADIOGRAPHIC STUDIES: Dg Chest 2 View  02/08/2012  *RADIOLOGY REPORT*  Clinical Data: Preoperative chest radiograph for Port-A-Cath insertion.  CHEST - 2 VIEW  Comparison: Chest radiograph performed 05/06/2009, and CTA of the chest performed 01/05/2012  Findings: The patient's right upper lobe lung mass is better characterized on prior CT and PET / CT.  The lungs are otherwise clear.  Mild peribronchial thickening is likely chronic in nature. No focal consolidation, pleural effusion or pneumothorax is seen.  The cardiomediastinal silhouette is normal in size; calcification is noted within the aortic arch.  Multiple rib deformities reflect the patient's known rib metastasis and also healing right-sided rib fractures.  There is chronic deformity involving the right clavicle.  IMPRESSION:  1.  Right upper lobe lung mass again noted.  Lungs otherwise clear. 2.  Multiple rib deformities reflect the patient's known rib metastasis and also healing right-sided rib fractures.   Original Report Authenticated By: Tonia Ghent, M.D.    Ct Biopsy  01/26/2012  *RADIOLOGY REPORT*  Indication: Presumed metastatic lung cancer, now with hypermetabolic left iliac lytic lesion.  CT GUIDED CORE NEEDLE BIOPSY OF LEFT ILIAC LYTIC LESION  Comparisons: PET CT - 01/20/2012; chest CT - 01/05/2012  Medications: Fentanyl 100 mcg IV; Versed 2 mg IV   Contrast: None  Sedation time: 20 minutes  CT Fluoroscopy time: 4 seconds  Complications: None immediate  TECHNIQUE/FINDINGS:  Informed consent was obtained from the patient following an explanation of the procedure, risks, benefits and alternatives.  A time out was performed prior to the initiation of the procedure.  The patient was positioned prone, slightly LPO on the CT table and a limited CT was performed for procedural planning demonstrating the approximately 2.6 x 1.7 cm lytic lesion involving the left iliac wing (image 7, series 2).  The procedure was planned.  The operative site was prepped and draped in the usual sterile fashion. Appropriate trajectory was confirmed with a 22 gauge spinal needle after the adjacent tissues were anesthetized with 1% Lidocaine with epinephrine.  Under intermittent CT guidance, a 17 gauge coaxial needle was advanced into the peripheral aspect of the lytic lesion. Positioning was confirmed and 5 core needle biopsies were obtained with an 18 gauge core needle biopsy device.  The co-axial needle was removed and hemostasis was achieved with manual compression.  A limited post procedural CT was negative for hemorrhage or additional complication.  A dressing was placed.  The patient tolerated the procedure well without immediate postprocedural complication.  IMPRESSION:  Technically successful CT guided core needle biopsy of dominant hypermetabolic lytic lesion involving the posterior aspect of the left iliac wing.   Original Report Authenticated By: Tacey Ruiz, MD    Dg Chest Port 1 View  02/08/2012  *RADIOLOGY REPORT*  Clinical Data: Port-A-Cath placement.  PORTABLE CHEST - 1 VIEW  Comparison: Pre procedure film of earlier in the day.  PET of 01/20/2012.  Chest CT of 01/05/2012.  Findings: Right-sided Port-A-Cath terminates at the mid to low SVC. No pneumothorax.  Normal heart size.  No pleural fluid.  Right upper lobe lung mass again identified.  Bilateral rib fractures and  metastasis.  IMPRESSION: Right-sided Port-A-Cath terminating at the mid to low SVC; no pneumothorax.  Right upper lobe lung mass, as before.   Original Report Authenticated By: Jeronimo Greaves, M.D.    Dg Fluoro Guide Cv Line-no Report  02/08/2012  CLINICAL DATA: needed for port a cath insertion   FLOURO GUIDE CV LINE  Fluoroscopy was utilized by the requesting physician.  No radiographic  interpretation.      ASSESSMENT/PLAN: This is a very pleasant 78 years old white male with metastatic non-small cell lung cancer, squamous cell carcinoma currently undergoing systemic chemotherapy with carboplatin and Abraxane. He is status post 1 cycle The patient is tolerating his treatment fairly well. The patient was discussed with Dr. Arbutus Ped. We'll proceed with cycle #2 of his systemic chemotherapy with carboplatin and Abraxane. His platelet count of 94,000 was reviewed with Dr. Arbutus Ped and he will proceed with chemotherapy as scheduled today. He will followup in 3 weeks prior to this the next cycle of chemotherapy with a repeat CBC differential and C. met.  Laural Benes, Diva Lemberger E, PA-C   All questions were answered. The patient knows to call the clinic with any problems, questions or concerns. We can certainly see the patient much sooner if necessary.  I spent 20 minutes counseling the patient face to face. The total time spent in the appointment was 30 minutes.

## 2012-03-10 ENCOUNTER — Emergency Department (HOSPITAL_COMMUNITY)
Admission: EM | Admit: 2012-03-10 | Discharge: 2012-03-11 | Disposition: A | Discharge status: Home / Self Care | Payer: Medicare Other | Attending: Emergency Medicine | Admitting: Emergency Medicine

## 2012-03-10 ENCOUNTER — Emergency Department (HOSPITAL_COMMUNITY): Payer: Medicare Other

## 2012-03-10 ENCOUNTER — Encounter (HOSPITAL_COMMUNITY): Payer: Self-pay | Admitting: *Deleted

## 2012-03-10 ENCOUNTER — Ambulatory Visit: Payer: Medicare Other | Admitting: Physician Assistant

## 2012-03-10 ENCOUNTER — Encounter: Payer: Self-pay | Admitting: Physician Assistant

## 2012-03-10 ENCOUNTER — Ambulatory Visit: Payer: Medicare Other

## 2012-03-10 DIAGNOSIS — Z87311 Personal history of (healed) other pathological fracture: Secondary | ICD-10-CM | POA: Insufficient documentation

## 2012-03-10 DIAGNOSIS — H349 Unspecified retinal vascular occlusion: Secondary | ICD-10-CM | POA: Insufficient documentation

## 2012-03-10 DIAGNOSIS — Z8709 Personal history of other diseases of the respiratory system: Secondary | ICD-10-CM | POA: Insufficient documentation

## 2012-03-10 DIAGNOSIS — E785 Hyperlipidemia, unspecified: Secondary | ICD-10-CM | POA: Insufficient documentation

## 2012-03-10 DIAGNOSIS — K5289 Other specified noninfective gastroenteritis and colitis: Secondary | ICD-10-CM | POA: Insufficient documentation

## 2012-03-10 DIAGNOSIS — J449 Chronic obstructive pulmonary disease, unspecified: Secondary | ICD-10-CM | POA: Insufficient documentation

## 2012-03-10 DIAGNOSIS — I359 Nonrheumatic aortic valve disorder, unspecified: Secondary | ICD-10-CM | POA: Insufficient documentation

## 2012-03-10 DIAGNOSIS — R0602 Shortness of breath: Secondary | ICD-10-CM | POA: Insufficient documentation

## 2012-03-10 DIAGNOSIS — K529 Noninfective gastroenteritis and colitis, unspecified: Secondary | ICD-10-CM

## 2012-03-10 DIAGNOSIS — I10 Essential (primary) hypertension: Secondary | ICD-10-CM | POA: Insufficient documentation

## 2012-03-10 DIAGNOSIS — Q251 Coarctation of aorta: Secondary | ICD-10-CM | POA: Insufficient documentation

## 2012-03-10 DIAGNOSIS — K2970 Gastritis, unspecified, without bleeding: Secondary | ICD-10-CM | POA: Insufficient documentation

## 2012-03-10 DIAGNOSIS — K219 Gastro-esophageal reflux disease without esophagitis: Secondary | ICD-10-CM | POA: Insufficient documentation

## 2012-03-10 DIAGNOSIS — Z7982 Long term (current) use of aspirin: Secondary | ICD-10-CM | POA: Insufficient documentation

## 2012-03-10 DIAGNOSIS — H539 Unspecified visual disturbance: Secondary | ICD-10-CM | POA: Insufficient documentation

## 2012-03-10 DIAGNOSIS — I69998 Other sequelae following unspecified cerebrovascular disease: Secondary | ICD-10-CM | POA: Insufficient documentation

## 2012-03-10 DIAGNOSIS — Z87891 Personal history of nicotine dependence: Secondary | ICD-10-CM | POA: Insufficient documentation

## 2012-03-10 DIAGNOSIS — Z79899 Other long term (current) drug therapy: Secondary | ICD-10-CM | POA: Insufficient documentation

## 2012-03-10 DIAGNOSIS — J4489 Other specified chronic obstructive pulmonary disease: Secondary | ICD-10-CM | POA: Insufficient documentation

## 2012-03-10 DIAGNOSIS — Z85118 Personal history of other malignant neoplasm of bronchus and lung: Secondary | ICD-10-CM | POA: Insufficient documentation

## 2012-03-10 LAB — CBC WITH DIFFERENTIAL/PLATELET
Basophils Relative: 0 % (ref 0–1)
Eosinophils Absolute: 0.1 10*3/uL (ref 0.0–0.7)
HCT: 32.4 % — ABNORMAL LOW (ref 39.0–52.0)
Hemoglobin: 11.3 g/dL — ABNORMAL LOW (ref 13.0–17.0)
Lymphs Abs: 0.2 10*3/uL — ABNORMAL LOW (ref 0.7–4.0)
MCH: 31.1 pg (ref 26.0–34.0)
MCHC: 34.9 g/dL (ref 30.0–36.0)
MCV: 89.3 fL (ref 78.0–100.0)
Monocytes Absolute: 0.3 10*3/uL (ref 0.1–1.0)
Monocytes Relative: 5 % (ref 3–12)
Neutrophils Relative %: 90 % — ABNORMAL HIGH (ref 43–77)
RBC: 3.63 MIL/uL — ABNORMAL LOW (ref 4.22–5.81)

## 2012-03-10 LAB — URINALYSIS, ROUTINE W REFLEX MICROSCOPIC
Glucose, UA: NEGATIVE mg/dL
Hgb urine dipstick: NEGATIVE
Protein, ur: NEGATIVE mg/dL
Specific Gravity, Urine: 1.021 (ref 1.005–1.030)
pH: 5.5 (ref 5.0–8.0)

## 2012-03-10 LAB — COMPREHENSIVE METABOLIC PANEL
Albumin: 2.9 g/dL — ABNORMAL LOW (ref 3.5–5.2)
Alkaline Phosphatase: 67 U/L (ref 39–117)
BUN: 25 mg/dL — ABNORMAL HIGH (ref 6–23)
Chloride: 103 mEq/L (ref 96–112)
Creatinine, Ser: 1.04 mg/dL (ref 0.50–1.35)
GFR calc Af Amer: 77 mL/min — ABNORMAL LOW (ref >90)
Glucose, Bld: 169 mg/dL — ABNORMAL HIGH (ref 70–99)
Potassium: 3.7 mEq/L (ref 3.5–5.1)
Total Bilirubin: 0.3 mg/dL (ref 0.3–1.2)
Total Protein: 5.7 g/dL — ABNORMAL LOW (ref 6.0–8.3)

## 2012-03-10 LAB — OCCULT BLOOD, POC DEVICE: Fecal Occult Bld: POSITIVE — AB

## 2012-03-10 LAB — LACTIC ACID, PLASMA: Lactic Acid, Venous: 1.1 mmol/L (ref 0.5–2.2)

## 2012-03-10 MED ORDER — IOHEXOL 300 MG/ML  SOLN
100.0000 mL | Freq: Once | INTRAMUSCULAR | Status: AC | PRN
  Administered 2012-03-10: 100 mL via INTRAVENOUS

## 2012-03-10 MED ORDER — METRONIDAZOLE 500 MG PO TABS
500.0000 mg | ORAL_TABLET | Freq: Once | ORAL | Status: AC
  Administered 2012-03-10: 500 mg via ORAL
  Filled 2012-03-10: qty 1

## 2012-03-10 MED ORDER — CIPROFLOXACIN HCL 500 MG PO TABS
500.0000 mg | ORAL_TABLET | Freq: Once | ORAL | Status: AC
  Administered 2012-03-10: 500 mg via ORAL
  Filled 2012-03-10: qty 1

## 2012-03-10 MED ORDER — HEPARIN 1 UNIT/ML CVL/PCVC NICU FLUSH
0.5000 mL | INJECTION | INTRAVENOUS | Status: DC | PRN
  Filled 2012-03-10: qty 10

## 2012-03-10 MED ORDER — HEPARIN SOD (PORK) LOCK FLUSH 100 UNIT/ML IV SOLN
INTRAVENOUS | Status: AC
  Administered 2012-03-10: via INTRAVENOUS
  Filled 2012-03-10: qty 5

## 2012-03-10 MED ORDER — SODIUM CHLORIDE 0.9 % IV BOLUS (SEPSIS)
1000.0000 mL | Freq: Once | INTRAVENOUS | Status: AC
  Administered 2012-03-10: 1000 mL via INTRAVENOUS

## 2012-03-10 MED ORDER — CIPROFLOXACIN HCL 500 MG PO TABS: 500.0000 mg | ORAL_TABLET | Freq: Two times a day (BID) | ORAL | Status: DC

## 2012-03-10 MED ORDER — METRONIDAZOLE 500 MG PO TABS: 500.0000 mg | ORAL_TABLET | Freq: Two times a day (BID) | ORAL | Status: DC

## 2012-03-10 NOTE — ED Notes (Signed)
Pt reports feeling as though he has an abd blockage. Sts last normal BM Monday night. Passing very small amounts of very watery diarrhea mixed with bright red blood. +nausea.

## 2012-03-10 NOTE — ED Provider Notes (Signed)
Patient complains of abdominal pain and feeling of distention. This increases had a bowel movement in the emergency department on exam patient is no distress abdomen nondistended minimally tender at infraumbilical/suprapubic area genitalia normal male At 10:55 PM patient is resting comfortably no distress feels well to go home. He is not lightheaded on standing Plan prescription Cipro, Flagyl. Followup Dr. Felipa Eth  Results for orders placed during the hospital encounter of 03/10/12  CBC WITH DIFFERENTIAL      Component Value Range   WBC 5.8  4.0 - 10.5 K/uL   RBC 3.63 (*) 4.22 - 5.81 MIL/uL   Hemoglobin 11.3 (*) 13.0 - 17.0 g/dL   HCT 09.8 (*) 11.9 - 14.7 %   MCV 89.3  78.0 - 100.0 fL   MCH 31.1  26.0 - 34.0 pg   MCHC 34.9  30.0 - 36.0 g/dL   RDW 82.9  56.2 - 13.0 %   Platelets 123 (*) 150 - 400 K/uL   Neutrophils Relative 90 (*) 43 - 77 %   Neutro Abs 5.2  1.7 - 7.7 K/uL   Lymphocytes Relative 3 (*) 12 - 46 %   Lymphs Abs 0.2 (*) 0.7 - 4.0 K/uL   Monocytes Relative 5  3 - 12 %   Monocytes Absolute 0.3  0.1 - 1.0 K/uL   Eosinophils Relative 2  0 - 5 %   Eosinophils Absolute 0.1  0.0 - 0.7 K/uL   Basophils Relative 0  0 - 1 %   Basophils Absolute 0.0  0.0 - 0.1 K/uL  COMPREHENSIVE METABOLIC PANEL      Component Value Range   Sodium 137  135 - 145 mEq/L   Potassium 3.7  3.5 - 5.1 mEq/L   Chloride 103  96 - 112 mEq/L   CO2 26  19 - 32 mEq/L   Glucose, Bld 169 (*) 70 - 99 mg/dL   BUN 25 (*) 6 - 23 mg/dL   Creatinine, Ser 8.65  0.50 - 1.35 mg/dL   Calcium 7.9 (*) 8.4 - 10.5 mg/dL   Total Protein 5.7 (*) 6.0 - 8.3 g/dL   Albumin 2.9 (*) 3.5 - 5.2 g/dL   AST 11  0 - 37 U/L   ALT 15  0 - 53 U/L   Alkaline Phosphatase 67  39 - 117 U/L   Total Bilirubin 0.3  0.3 - 1.2 mg/dL   GFR calc non Af Amer 67 (*) >90 mL/min   GFR calc Af Amer 77 (*) >90 mL/min  LACTIC ACID, PLASMA      Component Value Range   Lactic Acid, Venous 1.1  0.5 - 2.2 mmol/L  URINALYSIS, ROUTINE W REFLEX MICROSCOPIC      Component Value Range   Color, Urine YELLOW  YELLOW   APPearance CLOUDY (*) CLEAR   Specific Gravity, Urine 1.021  1.005 - 1.030   pH 5.5  5.0 - 8.0   Glucose, UA NEGATIVE  NEGATIVE mg/dL   Hgb urine dipstick NEGATIVE  NEGATIVE   Bilirubin Urine NEGATIVE  NEGATIVE   Ketones, ur NEGATIVE  NEGATIVE mg/dL   Protein, ur NEGATIVE  NEGATIVE mg/dL   Urobilinogen, UA 0.2  0.0 - 1.0 mg/dL   Nitrite NEGATIVE  NEGATIVE   Leukocytes, UA NEGATIVE  NEGATIVE  OCCULT BLOOD, POC DEVICE      Component Value Range   Fecal Occult Bld POSITIVE (*) NEGATIVE   Ct Abdomen Pelvis W Contrast  03/10/2012  *RADIOLOGY REPORT*  Clinical Data: Stage IV lung cancer  undergoing radiation and chemotherapy.  Low abdominal pain.  CT ABDOMEN AND PELVIS WITH CONTRAST  Technique:  Multidetector CT imaging of the abdomen and pelvis was performed following the standard protocol during bolus administration of intravenous contrast.  Contrast: OMNIPAQUE IOHEXOL 300 MG/ML  SOLN  Comparison: abdominal pelvic CT 11/18/2004.  PET CT 01/20/2012.  Findings: The lung bases are clear and there is no pleural effusion.  The liver, gallbladder, biliary system, pancreas and spleen demonstrate no significant findings.  There is no adrenal mass.  There are multiple low-density renal lesions bilaterally.  Some of these have enlarged from 2006, although they are most consistent with cysts.  There is no hydronephrosis.  There is diffuse wall thickening of the sigmoid colon with mild adjacent inflammatory change.  There is no extraluminal fluid collection.  The rectum and remainder of the colon appear normal. Aorto iliac atherosclerosis appears stable.  No large vessel occlusion is seen.  The bladder is mildly trabeculated.  The prostate gland appears normal.  There are multiple osseous metastases which have enlarged from the PET CT.  For example, there is a 2.6 cm lytic lesion within the right iliac bone on image 49.  There is an approximately 4.3 cm  lytic right sacral metastasis on image 54 and a 3.5 cm left iliac metastasis on image 58.  A fracture of the right eighth rib does not appear pathologic.  No pathologic fractures are identified.  IMPRESSION:  1.  Sigmoid colon wall thickening consistent with colitis.  This could be secondary to radiation therapy, infection or diverticulitis. 2.  Enlarging osseous metastases, primarily involving the pelvis as described.  No pathologic fracture identified. 3.  No extra osseous metastases identified.   Original Report Authenticated By: Carey Bullocks, M.D.    Dg Abd Acute W/chest  03/10/2012  *RADIOLOGY REPORT*  Clinical Data: Abdominal pain and nausea  ACUTE ABDOMEN SERIES (ABDOMEN 2 VIEW & CHEST 1 VIEW)  Comparison: Chest radiograph 02/08/2012  Findings: Normal cardiac silhouette.  No effusion, infiltrate, or pneumothorax.  Right-sided chest port noted.  There is no free air beneath the hemidiaphragms.  No dilated loops of large or small bowel.  IMPRESSION: 1.  No acute cardiopulmonary findings. 2.  No bowel obstruction intraperitoneal free air.   Original Report Authenticated By: Genevive Bi, M.D.      Doug Sou, MD 03/10/12 [phone]

## 2012-03-10 NOTE — ED Notes (Signed)
R chest power port de-accessed + blood return and  flushed with 20 ml NSS and heparinized per protocal. Pt tolerated well DSD applied.

## 2012-03-10 NOTE — ED Notes (Signed)
Patient transported to CT 

## 2012-03-10 NOTE — ED Provider Notes (Signed)
History     CSN: [id number]  Arrival date & time 03/10/12  1655   First MD Initiated Contact with Patient 03/10/12 1801      Chief Complaint  Patient presents with  . Abdominal Pain    (Consider location/radiation/quality/duration/timing/severity/associated sxs/prior treatment) HPI Comments: Patient with stage IV lung cancer currently undergoing radiation and chemotherapy treatments presents with lower abdominal pain beginning 4 days ago. Patient has had some trouble with bowel movements passing only small amounts of watery diarrhea. Stool is been mixed with a small amount of red blood at times. Patient has had nausea and infrequent episodes of vomiting. No fevers, cold symptoms, chest pain, shortness of breath. Patient states he has had some increased frequency but no other urinary symptoms. Patient is having chemotherapy and radiation therapy to bony metastasis in his hips. Onset gradual. Course is persistent. Nothing makes symptoms better or worse. Patient is on MS Contin currently. He has not been on stool softeners on a consistent basis. Patient is on aggrenox.   The history is provided by the patient.    Past Medical History  Diagnosis Date  . Hyperlipidemia   . Hypertension   . Aortic stenosis     mild to moderate  . Gastritis   . COPD (chronic obstructive pulmonary disease)   . Bronchitis   . Hepatitis     possible  . Hx of cataract surgery     lt eye  . Tobacco abuse   . Lung cancer 02/02/2012  . Shortness of breath     with activity  . Retinal vein occlusion 1990  . GERD (gastroesophageal reflux disease)   . Hemorrhoid   . Aortic stenosis   . Hypertension   . Dyslipidemia   . Glaucoma   . Right rib fracture      Pathological Fracture / 4 weeks ago  . Lung mass     Right Upper Lobe - Lobular Mass  . Bone metastases     Widespread/ Lytic Lesion Left 7t Rib, Left Iliac Pelvis, ribs and scapula  . SOB (shortness of breath)     Mild  . Cough   . Stroke 2002    Right slight difference in vision    Past Surgical History  Procedure Date  . Vasectomy   . Tonsillectomy   . Leg surgery     rt for nerve impingement  . Collarbone fracture     1977  . Tonsillectomy   . Eye surgery     catarct bil  . Portacath placement 02/08/2012    Procedure: INSERTION PORT-A-CATH;  Surgeon: [name], MD;  Location: [facility] OR;  Service: Thoracic;  Laterality: Right;  . Ct guided core biopsy 01/26/12    Left Lytic Lesion - Metastatic Squamous Cell Carcinoma  . Skin biopsy 02/09/12    Right Upper Back, shave : Melanoma In Situ Arising  From a Dysplastic Nevus    Family History  Problem Relation Age of Onset  . Heart attack Father     x7  . Throat cancer Father     Deceased from Staph Infection  . Heart attack Father     7 Times  . Leukemia Mother     History  Substance Use Topics  . Smoking status: Former Smoker -- 2.0 packs/day for 65 years    Types: Cigarettes    Quit date: 01/15/2012  . Smokeless tobacco: Never Used  . Alcohol Use: Yes     Comment: occassional  Review of Systems  Constitutional: Negative for fever.  HENT: Negative for sore throat and rhinorrhea.   Eyes: Negative for redness.  Respiratory: Negative for cough and shortness of breath.   Cardiovascular: Negative for chest pain.  Gastrointestinal: Positive for nausea, vomiting, abdominal pain, diarrhea, constipation and blood in stool.  Genitourinary: Positive for frequency. Negative for dysuria and flank pain.  Musculoskeletal: Negative for myalgias.  Skin: Negative for rash.  Neurological: Negative for headaches.    Allergies  Review of patient's allergies indicates no known allergies.  Home Medications   Current Outpatient Rx  Name  Route  Sig  Dispense  Refill  . ALBUTEROL SULFATE HFA 108 (90 BASE) MCG/ACT IN AERS   Inhalation   Inhale 2 puffs into the lungs every 4 (four) hours as needed.         Marland Kitchen AMLODIPINE BESYLATE 10 MG PO TABS   Oral   Take 10 mg  by mouth daily.           . ASPIRIN 81 MG PO TABS   Oral   Take 81 mg by mouth daily.         . AGGRENOX PO   Oral   Take 25-200 mg by mouth 2 (two) times daily. Takes 2 tabs one day alt. with 1 tab next day,etc.         . BETAXOLOL HCL 0.25 % OP SUSP   Both Eyes   Place 1 drop into both eyes daily. One drop in the left eye in the am....one drop in the right eye in the am and in the pm.         . HYDROCODONE-HOMATROPINE 5-1.5 MG/5ML PO SYRP   Oral   Take 5 mLs by mouth every 6 (six) hours as needed.         . IRBESARTAN 300 MG PO TABS   Oral   Take 300 mg by mouth daily.         Marland Kitchen LIDOCAINE-PRILOCAINE 2.5-2.5 % EX CREA   Topical   Apply topically as needed.   30 g   0   . MORPHINE SULFATE ER 15 MG PO TBCR   Oral   Take 1 tablet (15 mg total) by mouth 2 (two) times daily.   60 tablet   0   . MULTIVITAMIN PO   Oral   Take 1 tablet by mouth daily.           Marland Kitchen NIACIN ER (ANTIHYPERLIPIDEMIC) 500 MG PO TBCR   Oral   Take 500 mg by mouth at bedtime.          Marland Kitchen PROCHLORPERAZINE MALEATE 10 MG PO TABS   Oral   Take 10 mg by mouth every 6 (six) hours as needed.         . [facility] RANITIDINE PO   Oral   Take 150 mg by mouth daily.          Marland Kitchen ROSUVASTATIN CALCIUM 40 MG PO TABS   Oral   Take 20 mg by mouth daily.          . SENNA 8.6 MG PO TABS   Oral   Take 1 tablet by mouth.         Marland Kitchen TIOTROPIUM BROMIDE MONOHYDRATE 18 MCG IN CAPS   Inhalation   Place 18 mcg into inhaler and inhale daily.             BP 121/52  Pulse 93  Temp 99.2 F (37.3 C) (  Oral)  Resp 16  SpO2 96%  Physical Exam  Nursing note and vitals reviewed. Constitutional: He appears well-developed and well-nourished.  HENT:  Head: Normocephalic and atraumatic.  Eyes: Conjunctivae normal are normal. Right eye exhibits no discharge. Left eye exhibits no discharge.  Neck: Normal range of motion. Neck supple.  Cardiovascular: Normal rate, regular rhythm and normal heart  sounds.   Pulmonary/Chest: Effort normal and breath sounds normal.  Abdominal: Soft. Bowel sounds are normal. He exhibits no distension. There is tenderness. There is no rebound and no guarding.  Genitourinary: Rectal exam shows external hemorrhoid. Rectal exam shows no internal hemorrhoid, no fissure, no mass, no tenderness and anal tone normal. Guaiac positive stool (not grossly positive).  Musculoskeletal: He exhibits no edema and no tenderness.  Neurological: He is alert.  Skin: Skin is warm and dry.  Psychiatric: He has a normal mood and affect.    ED Course  Procedures (including critical care time)  Labs Reviewed  CBC WITH DIFFERENTIAL - Abnormal; Notable for the following:    RBC 3.63 (*)     Hemoglobin 11.3 (*)     HCT 32.4 (*)     Platelets 123 (*)     Neutrophils Relative 90 (*)     Lymphocytes Relative 3 (*)     Lymphs Abs 0.2 (*)     All other components within normal limits  COMPREHENSIVE METABOLIC PANEL - Abnormal; Notable for the following:    Glucose, Bld 169 (*)     BUN 25 (*)     Calcium 7.9 (*)     Total Protein 5.7 (*)     Albumin 2.9 (*)     GFR calc non Af Amer 67 (*)     GFR calc Af Amer 77 (*)     All other components within normal limits  OCCULT BLOOD, POC DEVICE - Abnormal; Notable for the following:    Fecal Occult Bld POSITIVE (*)     All other components within normal limits  LACTIC ACID, PLASMA  URINALYSIS, ROUTINE W REFLEX MICROSCOPIC   Dg Abd Acute W/chest  03/10/2012  *RADIOLOGY REPORT*  Clinical Data: Abdominal pain and nausea  ACUTE ABDOMEN SERIES (ABDOMEN 2 VIEW & CHEST 1 VIEW)  Comparison: Chest radiograph 02/08/2012  Findings: Normal cardiac silhouette.  No effusion, infiltrate, or pneumothorax.  Right-sided chest port noted.  There is no free air beneath the hemidiaphragms.  No dilated loops of large or small bowel.  IMPRESSION: 1.  No acute cardiopulmonary findings. 2.  No bowel obstruction intraperitoneal free air.   Original Report  Authenticated By: Genevive Bi, M.D.      No diagnosis found.  6:22 PM Patient seen and examined. X-ray reviewed by myself (result not crossing over but is has been read). No bowel obstruction or free air.    Vital signs reviewed and are as follows: Filed Vitals:   03/10/12 1728  BP: 121/52  Pulse: 93  Temp: 99.2 F (37.3 C)  Resp: 16   7:57 PM Patient seen by Dr. Ethelda Chick who advises fluids, CT abd pelvis. Hemoccult positive although not grossly positive.   Patient has had BM in ED which helped relieve his pain.  8:40 PM Handoff to Dr. Ethelda Chick who will follow-up on CT results.    MDM  Pending eval for abd pain.         Renne Crigler, [place] 03/10/12 2041  Renne Crigler, [place] 03/10/12 2041

## 2012-03-10 NOTE — Progress Notes (Signed)
Patient's wife Britta Mccreedy phoned to inform us that the patient has not felt well since receiving his concurrent chemoradiation earlier this week. He said some urinary frequency producing very small amounts of urine with one episode of blood in the urine. He is complaining that food does not seem to "go all weight down". He feels as though it is stuck. He's had some vomiting as well as some watery stools. I did speak with the patient personally and advised him to go to the emergency room as I am concerned that he may have a partial bowel obstruction. Patient was in agreement and will have his wife taken to the emergency room right away for evaluation.  Laural Benes, Chalon Zobrist E, PA-C

## 2012-03-11 ENCOUNTER — Ambulatory Visit: Payer: Medicare Other

## 2012-03-11 NOTE — ED Provider Notes (Signed)
Medical screening examination/treatment/procedure(s) were conducted as a shared visit with non-physician practitioner(s) and myself.  I personally evaluated the patient during the encounter  Doug Sou, MD 03/11/12 [phone]

## 2012-03-14 ENCOUNTER — Ambulatory Visit: Payer: Medicare Other

## 2012-03-14 ENCOUNTER — Other Ambulatory Visit (HOSPITAL_BASED_OUTPATIENT_CLINIC_OR_DEPARTMENT_OTHER): Payer: Medicare Other | Admitting: Lab

## 2012-03-14 DIAGNOSIS — C3490 Malignant neoplasm of unspecified part of unspecified bronchus or lung: Secondary | ICD-10-CM

## 2012-03-14 LAB — CBC WITH DIFFERENTIAL/PLATELET
Basophils Absolute: 0 10*3/uL (ref 0.0–0.1)
Eosinophils Absolute: 0.1 10*3/uL (ref 0.0–0.5)
HCT: 32.7 % — ABNORMAL LOW (ref 38.4–49.9)
HGB: 11.2 g/dL — ABNORMAL LOW (ref 13.0–17.1)
MONO#: 0.2 10*3/uL (ref 0.1–0.9)
NEUT#: 1.3 10*3/uL — ABNORMAL LOW (ref 1.5–6.5)
NEUT%: 65 % (ref 39.0–75.0)
WBC: 1.9 10*3/uL — ABNORMAL LOW (ref 4.0–10.3)
lymph#: 0.3 10*3/uL — ABNORMAL LOW (ref 0.9–3.3)

## 2012-03-14 NOTE — Progress Notes (Signed)
tx cancelled, per diane bell.    dmr

## 2012-03-15 ENCOUNTER — Ambulatory Visit: Payer: Medicare Other

## 2012-03-16 ENCOUNTER — Other Ambulatory Visit: Payer: Self-pay | Admitting: Medical Oncology

## 2012-03-16 DIAGNOSIS — C3490 Malignant neoplasm of unspecified part of unspecified bronchus or lung: Secondary | ICD-10-CM

## 2012-03-16 MED ORDER — MORPHINE SULFATE ER 15 MG PO TBCR: 15.0000 mg | EXTENDED_RELEASE_TABLET | Freq: Two times a day (BID) | ORAL | Status: DC

## 2012-03-16 NOTE — Telephone Encounter (Signed)
rx locked up in the injection room  for pt to pick up

## 2012-03-21 ENCOUNTER — Other Ambulatory Visit (HOSPITAL_BASED_OUTPATIENT_CLINIC_OR_DEPARTMENT_OTHER): Payer: Medicare Other

## 2012-03-21 ENCOUNTER — Ambulatory Visit (HOSPITAL_BASED_OUTPATIENT_CLINIC_OR_DEPARTMENT_OTHER): Payer: Medicare Other

## 2012-03-21 ENCOUNTER — Other Ambulatory Visit: Payer: Self-pay | Admitting: Internal Medicine

## 2012-03-21 VITALS — BP 138/68 | HR 74 | Temp 97.8°F | Resp 20

## 2012-03-21 DIAGNOSIS — C3490 Malignant neoplasm of unspecified part of unspecified bronchus or lung: Secondary | ICD-10-CM

## 2012-03-21 DIAGNOSIS — C3410 Malignant neoplasm of upper lobe, unspecified bronchus or lung: Secondary | ICD-10-CM

## 2012-03-21 DIAGNOSIS — C7951 Secondary malignant neoplasm of bone: Secondary | ICD-10-CM

## 2012-03-21 DIAGNOSIS — Z5111 Encounter for antineoplastic chemotherapy: Secondary | ICD-10-CM

## 2012-03-21 LAB — COMPREHENSIVE METABOLIC PANEL (CC13)
ALT: 14 U/L (ref 0–55)
CO2: 25 mEq/L (ref 22–29)
Calcium: 8.5 mg/dL (ref 8.4–10.4)
Chloride: 109 mEq/L — ABNORMAL HIGH (ref 98–107)
Glucose: 88 mg/dl (ref 70–99)
Sodium: 140 mEq/L (ref 136–145)
Total Bilirubin: 0.32 mg/dL (ref 0.20–1.20)
Total Protein: 5.9 g/dL — ABNORMAL LOW (ref 6.4–8.3)

## 2012-03-21 LAB — TECHNOLOGIST REVIEW

## 2012-03-21 LAB — CBC WITH DIFFERENTIAL/PLATELET
Basophils Absolute: 0 10*3/uL (ref 0.0–0.1)
Eosinophils Absolute: 0 10*3/uL (ref 0.0–0.5)
HGB: 11.7 g/dL — ABNORMAL LOW (ref 13.0–17.1)
LYMPH%: 25.7 % (ref 14.0–49.0)
MCV: 91.5 fL (ref 79.3–98.0)
MONO#: 0.7 10*3/uL (ref 0.1–0.9)
MONO%: 26.8 % — ABNORMAL HIGH (ref 0.0–14.0)
NEUT#: 1.2 10*3/uL — ABNORMAL LOW (ref 1.5–6.5)
Platelets: 182 10*3/uL (ref 140–400)
WBC: 2.7 10*3/uL — ABNORMAL LOW (ref 4.0–10.3)

## 2012-03-21 MED ORDER — SODIUM CHLORIDE 0.9 % IV SOLN
Freq: Once | INTRAVENOUS | Status: AC
  Administered 2012-03-21: 12:00:00 via INTRAVENOUS

## 2012-03-21 MED ORDER — PACLITAXEL PROTEIN-BOUND CHEMO INJECTION 100 MG
100.0000 mg/m2 | Freq: Once | INTRAVENOUS | Status: AC
  Administered 2012-03-21: 175 mg via INTRAVENOUS
  Filled 2012-03-21: qty 35

## 2012-03-21 MED ORDER — DEXAMETHASONE SODIUM PHOSPHATE 10 MG/ML IJ SOLN
10.0000 mg | Freq: Once | INTRAMUSCULAR | Status: AC
  Administered 2012-03-21: 10 mg via INTRAVENOUS

## 2012-03-21 MED ORDER — HEPARIN SOD (PORK) LOCK FLUSH 100 UNIT/ML IV SOLN
500.0000 [IU] | Freq: Once | INTRAVENOUS | Status: AC | PRN
  Administered 2012-03-21: 500 [IU]
  Filled 2012-03-21: qty 5

## 2012-03-21 MED ORDER — ONDANSETRON 8 MG/50ML IVPB (CHCC)
8.0000 mg | Freq: Once | INTRAVENOUS | Status: AC
  Administered 2012-03-21: 8 mg via INTRAVENOUS

## 2012-03-21 MED ORDER — SODIUM CHLORIDE 0.9 % IJ SOLN
10.0000 mL | INTRAMUSCULAR | Status: DC | PRN
  Administered 2012-03-21: 10 mL
  Filled 2012-03-21: qty 10

## 2012-03-21 NOTE — Patient Instructions (Addendum)
[facility] Discharge Instructions for Patients Receiving Chemotherapy  Today you received the following chemotherapy agents :  Abraxane.  To help prevent nausea and vomiting after your treatment, we encourage you to take your nausea medication as instructed by your physician.    If you develop nausea and vomiting that is not controlled by your nausea medication, call the clinic. If it is after clinic hours your family physician or the after hours number for the clinic or go to the Emergency Department.   BELOW ARE SYMPTOMS THAT SHOULD BE REPORTED IMMEDIATELY:  *FEVER GREATER THAN 100.5 F  *CHILLS WITH OR WITHOUT FEVER  NAUSEA AND VOMITING THAT IS NOT CONTROLLED WITH YOUR NAUSEA MEDICATION  *UNUSUAL SHORTNESS OF BREATH  *UNUSUAL BRUISING OR BLEEDING  TENDERNESS IN MOUTH AND THROAT WITH OR WITHOUT PRESENCE OF ULCERS  *URINARY PROBLEMS  *BOWEL PROBLEMS  UNUSUAL RASH Items with * indicate a potential emergency and should be followed up as soon as possible.  One of the nurses will contact you 24 hours after your treatment. Please let the nurse know about any problems that you may have experienced. Feel free to call the clinic you have any questions or concerns. The clinic phone number is [phone].   I have been informed and understand all the instructions given to me. I know to contact the clinic, my physician, or go to the Emergency Department if any problems should occur. I do not have any questions at this time, but understand that I may call the clinic during office hours   should I have any questions or need assistance in obtaining follow up care.    __________________________________________  _____________  __________ Signature of Patient or Authorized Representative            Date                   Time    __________________________________________ Nurse's Signature    

## 2012-03-28 ENCOUNTER — Ambulatory Visit (HOSPITAL_BASED_OUTPATIENT_CLINIC_OR_DEPARTMENT_OTHER): Payer: Medicare Other

## 2012-03-28 ENCOUNTER — Ambulatory Visit (HOSPITAL_BASED_OUTPATIENT_CLINIC_OR_DEPARTMENT_OTHER): Payer: Medicare Other | Admitting: Physician Assistant

## 2012-03-28 ENCOUNTER — Encounter: Payer: Self-pay | Admitting: Physician Assistant

## 2012-03-28 ENCOUNTER — Other Ambulatory Visit (HOSPITAL_BASED_OUTPATIENT_CLINIC_OR_DEPARTMENT_OTHER): Payer: Medicare Other | Admitting: Lab

## 2012-03-28 ENCOUNTER — Ambulatory Visit (HOSPITAL_COMMUNITY)
Admission: RE | Admit: 2012-03-28 | Discharge: 2012-03-28 | Disposition: A | Discharge status: Home / Self Care | Payer: Medicare Other | Source: Ambulatory Visit | Attending: Internal Medicine | Admitting: Internal Medicine

## 2012-03-28 ENCOUNTER — Telehealth: Payer: Self-pay | Admitting: Internal Medicine

## 2012-03-28 ENCOUNTER — Telehealth: Payer: Self-pay | Admitting: *Deleted

## 2012-03-28 VITALS — BP 149/77 | HR 84 | Temp 96.7°F | Resp 20 | Ht 67.0 in | Wt 151.1 lb

## 2012-03-28 DIAGNOSIS — C3410 Malignant neoplasm of upper lobe, unspecified bronchus or lung: Secondary | ICD-10-CM

## 2012-03-28 DIAGNOSIS — Z5111 Encounter for antineoplastic chemotherapy: Secondary | ICD-10-CM

## 2012-03-28 DIAGNOSIS — C3490 Malignant neoplasm of unspecified part of unspecified bronchus or lung: Secondary | ICD-10-CM

## 2012-03-28 DIAGNOSIS — C7952 Secondary malignant neoplasm of bone marrow: Secondary | ICD-10-CM

## 2012-03-28 DIAGNOSIS — M79609 Pain in unspecified limb: Secondary | ICD-10-CM | POA: Insufficient documentation

## 2012-03-28 DIAGNOSIS — C7951 Secondary malignant neoplasm of bone: Secondary | ICD-10-CM

## 2012-03-28 DIAGNOSIS — M25559 Pain in unspecified hip: Secondary | ICD-10-CM | POA: Insufficient documentation

## 2012-03-28 LAB — COMPREHENSIVE METABOLIC PANEL (CC13)
ALT: 16 U/L (ref 0–55)
AST: 16 U/L (ref 5–34)
Albumin: 3.2 g/dL — ABNORMAL LOW (ref 3.5–5.0)
CO2: 24 mEq/L (ref 22–29)
Calcium: 8.4 mg/dL (ref 8.4–10.4)
Chloride: 111 mEq/L — ABNORMAL HIGH (ref 98–107)
Creatinine: 0.9 mg/dL (ref 0.7–1.3)
Potassium: 4.2 mEq/L (ref 3.5–5.1)
Sodium: 143 mEq/L (ref 136–145)
Total Protein: 6.1 g/dL — ABNORMAL LOW (ref 6.4–8.3)

## 2012-03-28 LAB — CBC WITH DIFFERENTIAL/PLATELET
BASO%: 0.5 % (ref 0.0–2.0)
EOS%: 0.2 % (ref 0.0–7.0)
HCT: 32.1 % — ABNORMAL LOW (ref 38.4–49.9)
MCH: 30.5 pg (ref 27.2–33.4)
MCHC: 33 g/dL (ref 32.0–36.0)
MONO#: 0.4 10*3/uL (ref 0.1–0.9)
NEUT%: 76.7 % — ABNORMAL HIGH (ref 39.0–75.0)
RBC: 3.48 10*6/uL — ABNORMAL LOW (ref 4.20–5.82)
RDW: 14 % (ref 11.0–14.6)
WBC: 4.4 10*3/uL (ref 4.0–10.3)
lymph#: 0.6 10*3/uL — ABNORMAL LOW (ref 0.9–3.3)

## 2012-03-28 MED ORDER — SODIUM CHLORIDE 0.9 % IJ SOLN
10.0000 mL | INTRAMUSCULAR | Status: DC | PRN
  Administered 2012-03-28: 10 mL
  Filled 2012-03-28: qty 10

## 2012-03-28 MED ORDER — DEXAMETHASONE SODIUM PHOSPHATE 4 MG/ML IJ SOLN
20.0000 mg | Freq: Once | INTRAMUSCULAR | Status: AC
  Administered 2012-03-28: 20 mg via INTRAVENOUS

## 2012-03-28 MED ORDER — SODIUM CHLORIDE 0.9 % IV SOLN
Freq: Once | INTRAVENOUS | Status: AC
  Administered 2012-03-28: 13:00:00 via INTRAVENOUS

## 2012-03-28 MED ORDER — PACLITAXEL PROTEIN-BOUND CHEMO INJECTION 100 MG
100.0000 mg/m2 | Freq: Once | INTRAVENOUS | Status: AC
  Administered 2012-03-28: 175 mg via INTRAVENOUS
  Filled 2012-03-28: qty 35

## 2012-03-28 MED ORDER — HEPARIN SOD (PORK) LOCK FLUSH 100 UNIT/ML IV SOLN
500.0000 [IU] | Freq: Once | INTRAVENOUS | Status: AC | PRN
  Administered 2012-03-28: 500 [IU]
  Filled 2012-03-28: qty 5

## 2012-03-28 MED ORDER — ONDANSETRON 16 MG/50ML IVPB (CHCC)
16.0000 mg | Freq: Once | INTRAVENOUS | Status: AC
  Administered 2012-03-28: 16 mg via INTRAVENOUS

## 2012-03-28 MED ORDER — SODIUM CHLORIDE 0.9 % IV SOLN
460.0000 mg | Freq: Once | INTRAVENOUS | Status: AC
  Administered 2012-03-28: 460 mg via INTRAVENOUS
  Filled 2012-03-28: qty 46

## 2012-03-28 NOTE — Telephone Encounter (Signed)
Gave pt appt for January , February and March 2014, lab, MD and chemo, pt is aware of scan today after chemo

## 2012-03-28 NOTE — Patient Instructions (Addendum)
Continue with your chemotherapy and lab appointment for scheduled Stop by radiology department today to have an x-ray of your right femur taken Followup with Dr. Arbutus Ped in 3 weeks with a restaging CT scan of your chest to reevaluate her disease Keep your scheduled followup appointment with Dr. Basilio Cairo

## 2012-03-28 NOTE — Progress Notes (Signed)
[facility] Telephone:([phone]   Fax:([phone]) [phone]  OFFICE PROGRESS NOTE  [name], MD [address] [facility], [place]. [place] [address]  DIAGNOSIS: Stage IV non-small cell lung cancer, squamous cell carcinoma with significant bone metastases diagnosed in November of 2013.  PRIOR THERAPY: Status post palliative radiotherapy to the right hip and sacrum under the care of Dr. Basilio Cairo.  CURRENT THERAPY:  systemic chemotherapy with carboplatin for AUC of 6 on day 1 and Abraxane 100 mg/M2 on days 1, 8 and 15 every 3 weeks. The patient is status post 2 cycles   INTERVAL HISTORY: Joel Gibson 79 y.o. male returns to the clinic today for followup visit accompanied his wife. He reports that he went to the emergency room on 03/10/2012 for abdominal pain. He was thoroughly evaluated and treated for "colitis" with a 10 day course of Cipro and Flagyl. He completed both of these medications and feels significantly better. A CT of the abdomen and pelvis was done as a part of his workup in the emergency department for his colitis. He has tolerated his first 2 cycles of systemic chemotherapy with carboplatin and Abraxane without difficulty. He does report some tenderness in the lower/posterior right thigh area if he presses on it. He states that it does not hurt to move around.  He denied having any significant nausea or vomiting, no fever or chills. The patient denied having any significant weight loss or night sweats. He denied having any chest pain, shortness breath, cough or hemoptysis.  MEDICAL HISTORY: Past Medical History  Diagnosis Date  . Hyperlipidemia   . Hypertension   . Aortic stenosis     mild to moderate  . Gastritis   . COPD (chronic obstructive pulmonary disease)   . Bronchitis   . Hepatitis     possible  . Hx of cataract surgery     lt eye  . Tobacco abuse   . Lung cancer 02/02/2012  . Shortness of breath     with  activity  . Retinal vein occlusion 1990  . GERD (gastroesophageal reflux disease)   . Hemorrhoid   . Aortic stenosis   . Hypertension   . Dyslipidemia   . Glaucoma   . Right rib fracture      Pathological Fracture / 4 weeks ago  . Lung mass     Right Upper Lobe - Lobular Mass  . Bone metastases     Widespread/ Lytic Lesion Left 7t Rib, Left Iliac Pelvis, ribs and scapula  . SOB (shortness of breath)     Mild  . Cough   . Stroke 2002    Right slight difference in vision    ALLERGIES:   has no known allergies.  MEDICATIONS:  Current Outpatient Prescriptions  Medication Sig Dispense Refill  . albuterol (PROVENTIL HFA;VENTOLIN HFA) 108 (90 BASE) MCG/ACT inhaler Inhale 2 puffs into the lungs every 4 (four) hours as needed.      Marland Kitchen amLODipine (NORVASC) 10 MG tablet Take 10 mg by mouth daily. Per Dr. Caryn Section patient to hold this medication unless/until BP is > 150/110      . aspirin EC 81 MG tablet Take 81 mg by mouth every other day. Takes along with Aggrenox      . Aspirin-Dipyridamole (AGGRENOX PO) Take 25-200 mg by mouth 2 (two) times daily. Takes 2 tabs one day alt. with 1 tab next day,etc.      . betaxolol (BETOPTIC-S) 0.25 %  ophthalmic suspension Place 1 drop into both eyes daily. One drop in the left eye in the am....one drop in the right eye in the am and in the pm.      . irbesartan (AVAPRO) 300 MG tablet Take 300 mg by mouth daily.       Marland Kitchen lidocaine-prilocaine (EMLA) cream Apply 1 application topically as needed.      Marland Kitchen morphine (MS CONTIN) 15 MG 12 hr tablet Take 1 tablet (15 mg total) by mouth 2 (two) times daily.  60 tablet  0  . Multiple Vitamin (MULTIVITAMIN PO) Take 1 tablet by mouth daily.        . niacin (NIASPAN) 500 MG CR tablet Take 500 mg by mouth at bedtime.       . prochlorperazine (COMPAZINE) 10 MG tablet Take 10 mg by mouth every 6 (six) hours as needed. For nausea      . Ranitidine HCl ([facility] RANITIDINE PO) Take 150 mg by mouth daily.       . rosuvastatin (CRESTOR)  40 MG tablet Take 20 mg by mouth daily.       Marland Kitchen senna (SENOKOT) 8.6 MG TABS Take 1 tablet by mouth.      . tiotropium (SPIRIVA) 18 MCG inhalation capsule Place 18 mcg into inhaler and inhale daily.         No current facility-administered medications for this visit.   Facility-Administered Medications Ordered in Other Visits  Medication Dose Route Frequency Provider Last Rate Last Dose  . heparin lock flush 100 unit/mL  500 Units Intracatheter Once PRN Conni Slipper, PA      . sodium chloride 0.9 % injection 10 mL  10 mL Intracatheter PRN Conni Slipper, PA        SURGICAL HISTORY:  Past Surgical History  Procedure Date  . Vasectomy   . Tonsillectomy   . Leg surgery     rt for nerve impingement  . Collarbone fracture     1977  . Tonsillectomy   . Eye surgery     catarct bil  . Portacath placement 02/08/2012    Procedure: INSERTION PORT-A-CATH;  Surgeon: [name], MD;  Location: [facility] OR;  Service: Thoracic;  Laterality: Right;  . Ct guided core biopsy 01/26/12    Left Lytic Lesion - Metastatic Squamous Cell Carcinoma  . Skin biopsy 02/09/12    Right Upper Back, shave : Melanoma In Situ Arising  From a Dysplastic Nevus    REVIEW OF SYSTEMS:  Pertinent items are noted in HPI.   PHYSICAL EXAMINATION: General appearance: alert, cooperative and no distress Head: Normocephalic, without obvious abnormality, atraumatic Neck: no adenopathy Lymph nodes: Cervical, supraclavicular, and axillary nodes normal. Resp: clear to auscultation bilaterally Cardio: regular rate and rhythm, S1, S2 normal, no murmur, click, rub or gallop GI: soft, non-tender; bowel sounds normal; no masses,  no organomegaly Extremities: extremities normal, atraumatic, no cyanosis or edema  ECOG PERFORMANCE STATUS: 1 - Symptomatic but completely ambulatory  Blood pressure 149/77, pulse 84, temperature 96.7 F (35.9 C), temperature source Oral, resp. rate 20, height 5\' 7"  (1.702 m), weight 151 lb 1.6 oz  (68.539 kg).  LABORATORY DATA: Lab Results  Component Value Date   WBC 4.4 03/28/2012   HGB 10.6* 03/28/2012   HCT 32.1* 03/28/2012   MCV 92.2 03/28/2012   PLT 148 03/28/2012      Chemistry      Component Value Date/Time   NA 143 03/28/2012 1120   NA 137  03/10/2012 1804   K 4.2 03/28/2012 1120   K 3.7 03/10/2012 1804   CL 111* 03/28/2012 1120   CL 103 03/10/2012 1804   CO2 24 03/28/2012 1120   CO2 26 03/10/2012 1804   BUN 19.0 03/28/2012 1120   BUN 25* 03/10/2012 1804   CREATININE 0.9 03/28/2012 1120   CREATININE 1.04 03/10/2012 1804      Component Value Date/Time   CALCIUM 8.4 03/28/2012 1120   CALCIUM 7.9* 03/10/2012 1804   ALKPHOS 68 03/28/2012 1120   ALKPHOS 67 03/10/2012 1804   AST 16 03/28/2012 1120   AST 11 03/10/2012 1804   ALT 16 03/28/2012 1120   ALT 15 03/10/2012 1804   BILITOT 0.31 03/28/2012 1120   BILITOT 0.3 03/10/2012 1804       RADIOGRAPHIC STUDIES: Dg Chest 2 View  02/08/2012  *RADIOLOGY REPORT*  Clinical Data: Preoperative chest radiograph for Port-A-Cath insertion.  CHEST - 2 VIEW  Comparison: Chest radiograph performed 05/06/2009, and CTA of the chest performed 01/05/2012  Findings: The patient's right upper lobe lung mass is better characterized on prior CT and PET / CT.  The lungs are otherwise clear.  Mild peribronchial thickening is likely chronic in nature. No focal consolidation, pleural effusion or pneumothorax is seen.  The cardiomediastinal silhouette is normal in size; calcification is noted within the aortic arch.  Multiple rib deformities reflect the patient's known rib metastasis and also healing right-sided rib fractures.  There is chronic deformity involving the right clavicle.  IMPRESSION:  1.  Right upper lobe lung mass again noted.  Lungs otherwise clear. 2.  Multiple rib deformities reflect the patient's known rib metastasis and also healing right-sided rib fractures.   Original Report Authenticated By: Tonia Ghent, M.D.    Ct Biopsy  01/26/2012  *RADIOLOGY  REPORT*  Indication: Presumed metastatic lung cancer, now with hypermetabolic left iliac lytic lesion.  CT GUIDED CORE NEEDLE BIOPSY OF LEFT ILIAC LYTIC LESION  Comparisons: PET CT - 01/20/2012; chest CT - 01/05/2012  Medications: Fentanyl 100 mcg IV; Versed 2 mg IV  Contrast: None  Sedation time: 20 minutes  CT Fluoroscopy time: 4 seconds  Complications: None immediate  TECHNIQUE/FINDINGS:  Informed consent was obtained from the patient following an explanation of the procedure, risks, benefits and alternatives.  A time out was performed prior to the initiation of the procedure.  The patient was positioned prone, slightly LPO on the CT table and a limited CT was performed for procedural planning demonstrating the approximately 2.6 x 1.7 cm lytic lesion involving the left iliac wing (image 7, series 2).  The procedure was planned.  The operative site was prepped and draped in the usual sterile fashion. Appropriate trajectory was confirmed with a 22 gauge spinal needle after the adjacent tissues were anesthetized with 1% Lidocaine with epinephrine.  Under intermittent CT guidance, a 17 gauge coaxial needle was advanced into the peripheral aspect of the lytic lesion. Positioning was confirmed and 5 core needle biopsies were obtained with an 18 gauge core needle biopsy device.  The co-axial needle was removed and hemostasis was achieved with manual compression.  A limited post procedural CT was negative for hemorrhage or additional complication.  A dressing was placed.  The patient tolerated the procedure well without immediate postprocedural complication.  IMPRESSION:  Technically successful CT guided core needle biopsy of dominant hypermetabolic lytic lesion involving the posterior aspect of the left iliac wing.   Original Report Authenticated By: Tacey Ruiz, MD  Dg Chest Port 1 View  02/08/2012  *RADIOLOGY REPORT*  Clinical Data: Port-A-Cath placement.  PORTABLE CHEST - 1 VIEW  Comparison: Pre procedure film  of earlier in the day.  PET of 01/20/2012.  Chest CT of 01/05/2012.  Findings: Right-sided Port-A-Cath terminates at the mid to low SVC. No pneumothorax.  Normal heart size.  No pleural fluid.  Right upper lobe lung mass again identified.  Bilateral rib fractures and metastasis.  IMPRESSION: Right-sided Port-A-Cath terminating at the mid to low SVC; no pneumothorax.  Right upper lobe lung mass, as before.   Original Report Authenticated By: Jeronimo Greaves, M.D.    Dg Fluoro Guide Cv Line-no Report  02/08/2012  CLINICAL DATA: needed for port a cath insertion   FLOURO GUIDE CV LINE  Fluoroscopy was utilized by the requesting physician.  No radiographic  interpretation.      ASSESSMENT/PLAN: This is a very pleasant 79 years old white male with metastatic non-small cell lung cancer, squamous cell carcinoma currently undergoing systemic chemotherapy with carboplatin and Abraxane. He is status post 2 cycles The patient is tolerating his treatment fairly well. The patient was discussed with Dr. Arbutus Ped. We'll proceed with cycle #3 of his systemic chemotherapy with carboplatin and Abraxane. To address his right thigh pain we will obtain a plain film of the right femur looking for possible evidence of bony metastasis. The CT of the abdomen and pelvis was performed on 03-2012 revealed some increased bony metastasis involving the pelvis. The patient has a followup appointment with Dr. Basilio Cairo on 04/08/2012. He'll followup with Dr. Arbutus Ped in 3 weeks with repeat CBC differential, C. met and CT of the chest with contrast to reevaluate his disease.   Laural Benes, Keandria Berrocal E, PA-C   All questions were answered. The patient knows to call the clinic with any problems, questions or concerns. We can certainly see the patient much sooner if necessary.  I spent 20 minutes counseling the patient face to face. The total time spent in the appointment was 30 minutes.

## 2012-03-28 NOTE — Patient Instructions (Addendum)
[facility] Discharge Instructions for Patients Receiving Chemotherapy  Today you received the following chemotherapy agents abraxane/carbo To help prevent nausea and vomiting after your treatment, we encourage you to take your nausea medication as directed   If you develop nausea and vomiting that is not controlled by your nausea medication, call the clinic. If it is after clinic hours your family physician or the after hours number for the clinic or go to the Emergency Department.   BELOW ARE SYMPTOMS THAT SHOULD BE REPORTED IMMEDIATELY:  *FEVER GREATER THAN 100.5 F  *CHILLS WITH OR WITHOUT FEVER  NAUSEA AND VOMITING THAT IS NOT CONTROLLED WITH YOUR NAUSEA MEDICATION  *UNUSUAL SHORTNESS OF BREATH  *UNUSUAL BRUISING OR BLEEDING  TENDERNESS IN MOUTH AND THROAT WITH OR WITHOUT PRESENCE OF ULCERS  *URINARY PROBLEMS  *BOWEL PROBLEMS  UNUSUAL RASH Items with * indicate a potential emergency and should be followed up as soon as possible.  One of the nurses will contact you 24 hours after your treatment. Please let the nurse know about any problems that you may have experienced. Feel free to call the clinic you have any questions or concerns. The clinic phone number is [phone].   I have been informed and understand all the instructions given to me. I know to contact the clinic, my physician, or go to the Emergency Department if any problems should occur. I do not have any questions at this time, but understand that I may call the clinic during office hours   should I have any questions or need assistance in obtaining follow up care.    __________________________________________  _____________  __________ Signature of Patient or Authorized Representative            Date                   Time    __________________________________________ Nurse's Signature

## 2012-03-28 NOTE — Telephone Encounter (Signed)
Per staff phone call and POF I have schedueld appts.  JMW  

## 2012-03-31 ENCOUNTER — Telehealth: Payer: Self-pay | Admitting: Medical Oncology

## 2012-03-31 NOTE — Telephone Encounter (Signed)
Dr Jari Favre , DDS wants Dr Arbutus Ped to call him.

## 2012-03-31 NOTE — Telephone Encounter (Signed)
I spoke to Dr Jari Favre who said pt has good oral hygeine but  Joel Gibson  needs to see periodontist for upper Left 2nd molar treatment . If he doesn't have this done then the tooth will need to be extracted. Dr Jari Favre said he will talk to pt again but he cannot clear him for Zometa. Note sent to Dr Arbutus Ped.

## 2012-04-04 ENCOUNTER — Other Ambulatory Visit (HOSPITAL_BASED_OUTPATIENT_CLINIC_OR_DEPARTMENT_OTHER): Payer: Medicare Other | Admitting: Lab

## 2012-04-04 ENCOUNTER — Other Ambulatory Visit: Payer: Self-pay | Admitting: Internal Medicine

## 2012-04-04 ENCOUNTER — Ambulatory Visit (HOSPITAL_BASED_OUTPATIENT_CLINIC_OR_DEPARTMENT_OTHER): Payer: Medicare Other

## 2012-04-04 VITALS — BP 100/59 | HR 85 | Temp 97.6°F

## 2012-04-04 DIAGNOSIS — Z5111 Encounter for antineoplastic chemotherapy: Secondary | ICD-10-CM

## 2012-04-04 DIAGNOSIS — C3490 Malignant neoplasm of unspecified part of unspecified bronchus or lung: Secondary | ICD-10-CM

## 2012-04-04 DIAGNOSIS — C7951 Secondary malignant neoplasm of bone: Secondary | ICD-10-CM

## 2012-04-04 DIAGNOSIS — C7952 Secondary malignant neoplasm of bone marrow: Secondary | ICD-10-CM

## 2012-04-04 DIAGNOSIS — C3410 Malignant neoplasm of upper lobe, unspecified bronchus or lung: Secondary | ICD-10-CM

## 2012-04-04 LAB — COMPREHENSIVE METABOLIC PANEL (CC13)
ALT: 15 U/L (ref 0–55)
Albumin: 3.1 g/dL — ABNORMAL LOW (ref 3.5–5.0)
CO2: 24 mEq/L (ref 22–29)
Calcium: 8.6 mg/dL (ref 8.4–10.4)
Chloride: 106 mEq/L (ref 98–107)
Creatinine: 1.1 mg/dL (ref 0.7–1.3)
Potassium: 3.9 mEq/L (ref 3.5–5.1)
Total Protein: 5.9 g/dL — ABNORMAL LOW (ref 6.4–8.3)

## 2012-04-04 LAB — CBC WITH DIFFERENTIAL/PLATELET
Eosinophils Absolute: 0 10*3/uL (ref 0.0–0.5)
HCT: 29.6 % — ABNORMAL LOW (ref 38.4–49.9)
LYMPH%: 23.8 % (ref 14.0–49.0)
MONO#: 0.3 10*3/uL (ref 0.1–0.9)
NEUT#: 1.1 10*3/uL — ABNORMAL LOW (ref 1.5–6.5)
Platelets: 92 10*3/uL — ABNORMAL LOW (ref 140–400)
RBC: 3.24 10*6/uL — ABNORMAL LOW (ref 4.20–5.82)
WBC: 1.9 10*3/uL — ABNORMAL LOW (ref 4.0–10.3)
lymph#: 0.4 10*3/uL — ABNORMAL LOW (ref 0.9–3.3)
nRBC: 0 % (ref 0–0)

## 2012-04-04 LAB — TECHNOLOGIST REVIEW

## 2012-04-04 MED ORDER — PACLITAXEL PROTEIN-BOUND CHEMO INJECTION 100 MG
80.0000 mg/m2 | Freq: Once | INTRAVENOUS | Status: AC
  Administered 2012-04-04: 150 mg via INTRAVENOUS
  Filled 2012-04-04: qty 30

## 2012-04-04 MED ORDER — HEPARIN SOD (PORK) LOCK FLUSH 100 UNIT/ML IV SOLN
500.0000 [IU] | Freq: Once | INTRAVENOUS | Status: AC | PRN
  Administered 2012-04-04: 500 [IU]
  Filled 2012-04-04: qty 5

## 2012-04-04 MED ORDER — SODIUM CHLORIDE 0.9 % IV SOLN
Freq: Once | INTRAVENOUS | Status: AC
  Administered 2012-04-04: 11:00:00 via INTRAVENOUS

## 2012-04-04 MED ORDER — SODIUM CHLORIDE 0.9 % IJ SOLN
10.0000 mL | INTRAMUSCULAR | Status: DC | PRN
  Administered 2012-04-04: 10 mL
  Filled 2012-04-04: qty 10

## 2012-04-04 MED ORDER — DEXAMETHASONE SODIUM PHOSPHATE 10 MG/ML IJ SOLN
10.0000 mg | Freq: Once | INTRAMUSCULAR | Status: AC
  Administered 2012-04-04: 10 mg via INTRAVENOUS

## 2012-04-04 MED ORDER — ONDANSETRON 8 MG/50ML IVPB (CHCC)
8.0000 mg | Freq: Once | INTRAVENOUS | Status: AC
  Administered 2012-04-04: 8 mg via INTRAVENOUS

## 2012-04-04 NOTE — Patient Instructions (Addendum)
[facility] Discharge Instructions for Patients Receiving Chemotherapy  Today you received the following chemotherapy agents: abraxane  To help prevent nausea and vomiting after your treatment, we encourage you to take your nausea medication.  Take it as often as prescribed.     If you develop nausea and vomiting that is not controlled by your nausea medication, call the clinic. If it is after clinic hours your family physician or the after hours number for the clinic or go to the Emergency Department.   BELOW ARE SYMPTOMS THAT SHOULD BE REPORTED IMMEDIATELY:  *FEVER GREATER THAN 100.5 F  *CHILLS WITH OR WITHOUT FEVER  NAUSEA AND VOMITING THAT IS NOT CONTROLLED WITH YOUR NAUSEA MEDICATION  *UNUSUAL SHORTNESS OF BREATH  *UNUSUAL BRUISING OR BLEEDING  TENDERNESS IN MOUTH AND THROAT WITH OR WITHOUT PRESENCE OF ULCERS  *URINARY PROBLEMS  *BOWEL PROBLEMS  UNUSUAL RASH Items with * indicate a potential emergency and should be followed up as soon as possible.  Feel free to call the clinic you have any questions or concerns. The clinic phone number is [phone].   I have been informed and understand all the instructions given to me. I know to contact the clinic, my physician, or go to the Emergency Department if any problems should occur. I do not have any questions at this time, but understand that I may call the clinic during office hours   should I have any questions or need assistance in obtaining follow up care.    __________________________________________  _____________  __________ Signature of Patient or Authorized Representative            Date                   Time    __________________________________________ Nurse's Signature   

## 2012-04-04 NOTE — Progress Notes (Signed)
OK to treat per Dr. Arbutus Ped - he will reduce dose.  WBC 1.9, Hgb 9.9, Plt 92, ANC 1.1.

## 2012-04-07 ENCOUNTER — Encounter: Payer: Self-pay | Admitting: Radiation Oncology

## 2012-04-08 ENCOUNTER — Ambulatory Visit
Admission: RE | Admit: 2012-04-08 | Discharge: 2012-04-08 | Disposition: A | Discharge status: Home / Self Care | Payer: Medicare Other | Source: Ambulatory Visit | Attending: Radiation Oncology | Admitting: Radiation Oncology

## 2012-04-08 ENCOUNTER — Encounter: Payer: Self-pay | Admitting: Radiation Oncology

## 2012-04-08 VITALS — BP 130/64 | HR 90 | Temp 97.5°F | Wt 150.8 lb

## 2012-04-08 DIAGNOSIS — C7951 Secondary malignant neoplasm of bone: Secondary | ICD-10-CM

## 2012-04-08 HISTORY — DX: Personal history of irradiation: Z92.3

## 2012-04-08 HISTORY — DX: Other long term (current) drug therapy: Z79.899

## 2012-04-08 HISTORY — DX: Long term (current) use of other immunomodulators and immunosuppressants: Z79.69

## 2012-04-08 NOTE — Progress Notes (Addendum)
Mr. Klinke here today for assessment following radiation to the Pelvis, Sacrum, Left scapula and rib.  He reports soreness in the back below his scapula and has "sensitivity" in the bilateral hips, but denies any pain in the prior treatment areas,nor any new pain.   Accompanied by his spouse.

## 2012-04-08 NOTE — Progress Notes (Signed)
Radiation Oncology         [phone] ________________________________  Name: Joel Gibson MRN: [id number]  Date: 04/08/2012  DOB: 01/15/1932  Follow-Up Visit Note  Outpatient  Diagnosis: Bone metastases, metastatic squamous cell carcinoma, consistent with lung primary    Prior Radiation treatment dates: 02/22/2012-03/07/2012  Site/dose: 1) pelvis and sacrum/30 Gray in 10 fractions  2) left scapula and rib/ 8 Gray in 1 fraction   Narrative:  The patient returns today for routine follow-up.  He was seen in the emergency room in January 2 with abdominal pain. CT scan demonstrated colitis in the sigmoid colon. He was discharged with Cipro and Flagyl. He feels significantly better.  He continues carboplatin and Abraxane. He has received 5 cycles, he reports, however the latest documentation from  medical oncology reports 3 cycles. There has been showed dose adjustment due to cytopenia . He reports sensitivity, but not pain, in the back below his scapula and has "sensitivity" in the bilateral hips, but denies any pain in the prior treatment areas,nor any new pain.                            ALLERGIES:   has no known allergies.  Meds: Current Outpatient Prescriptions  Medication Sig Dispense Refill  . albuterol (PROVENTIL HFA;VENTOLIN HFA) 108 (90 BASE) MCG/ACT inhaler Inhale 2 puffs into the lungs every 4 (four) hours as needed.      Marland Kitchen amLODipine (NORVASC) 10 MG tablet Take 10 mg by mouth daily. Per Dr. Caryn Section patient to hold this medication unless/until BP is > 150/110      . aspirin EC 81 MG tablet Take 81 mg by mouth every other day. Takes along with Aggrenox      . Aspirin-Dipyridamole (AGGRENOX PO) Take 25-200 mg by mouth 2 (two) times daily. Takes 2 tabs one day alt. with 1 tab next day,etc.      . betaxolol (BETOPTIC-S) 0.25 % ophthalmic suspension Place 1 drop into both eyes daily. One drop in the left eye in the am....one drop in the right eye in the am and in the pm.      .  irbesartan (AVAPRO) 300 MG tablet Take 300 mg by mouth daily.       Marland Kitchen lidocaine-prilocaine (EMLA) cream Apply 1 application topically as needed.      Marland Kitchen morphine (MS CONTIN) 15 MG 12 hr tablet Take 1 tablet (15 mg total) by mouth 2 (two) times daily.  60 tablet  0  . Multiple Vitamin (MULTIVITAMIN PO) Take 1 tablet by mouth daily.        . niacin (NIASPAN) 500 MG CR tablet Take 500 mg by mouth at bedtime.       . prochlorperazine (COMPAZINE) 10 MG tablet Take 10 mg by mouth every 6 (six) hours as needed. For nausea      . Ranitidine HCl ([employer] RANITIDINE PO) Take 150 mg by mouth daily.       . rosuvastatin (CRESTOR) 40 MG tablet Take 20 mg by mouth daily.       Marland Kitchen senna (SENOKOT) 8.6 MG TABS Take 1 tablet by mouth.      . tiotropium (SPIRIVA) 18 MCG inhalation capsule Place 18 mcg into inhaler and inhale daily.          Physical Findings: The patient is in no acute distress. Patient is alert and oriented.  weight is 150 lb 12.8 oz (68.402 kg). His temperature  is 97.5 F (36.4 C). His blood pressure is 130/64 and his pulse is 90. Marland Kitchen  General: Alert and oriented, in no acute distress HEENT:alopecia Heart: Regular in rate and rhythm  Chest: Clear to auscultation bilaterally, with no rhonchi, wheezes, or rales. Abdomen: Soft, , a little tender in LLQ, no guarding Skin: No concerning lesions over back. Psychiatric: Judgment and insight are intact. Affect is appropriate.   Lab Findings: Lab Results  Component Value Date   WBC 1.9* 04/04/2012   HGB 9.9* 04/04/2012   HCT 29.6* 04/04/2012   MCV 91.4 04/04/2012   PLT 92* 04/04/2012    CMP     Component Value Date/Time   NA 140 04/04/2012 1041   NA 137 03/10/2012 1804   K 3.9 04/04/2012 1041   K 3.7 03/10/2012 1804   CL 106 04/04/2012 1041   CL 103 03/10/2012 1804   CO2 24 04/04/2012 1041   CO2 26 03/10/2012 1804   GLUCOSE 126* 04/04/2012 1041   GLUCOSE 169* 03/10/2012 1804   BUN 19.5 04/04/2012 1041   BUN 25* 03/10/2012 1804   CREATININE 1.1 04/04/2012  1041   CREATININE 1.04 03/10/2012 1804   CALCIUM 8.6 04/04/2012 1041   CALCIUM 7.9* 03/10/2012 1804   PROT 5.9* 04/04/2012 1041   PROT 5.7* 03/10/2012 1804   ALBUMIN 3.1* 04/04/2012 1041   ALBUMIN 2.9* 03/10/2012 1804   AST 14 04/04/2012 1041   AST 11 03/10/2012 1804   ALT 15 04/04/2012 1041   ALT 15 03/10/2012 1804   ALKPHOS 74 04/04/2012 1041   ALKPHOS 67 03/10/2012 1804   BILITOT 0.40 04/04/2012 1041   BILITOT 0.3 03/10/2012 1804   GFRNONAA 67* 03/10/2012 1804   GFRAA 77* 03/10/2012 1804     Radiographic Findings: Dg Femur Right  03/28/2012  *RADIOLOGY REPORT*  Clinical Data: Right hip and femur pain.  RIGHT FEMUR - 2 VIEW  Comparison: None.  Findings: Right femur is intact.  Right hip joint is unremarkable.  IMPRESSION: Negative.   Original Report Authenticated By: Leanna Battles, M.D.    Ct Abdomen Pelvis W Contrast  03/10/2012  *RADIOLOGY REPORT*  Clinical Data: Stage IV lung cancer undergoing radiation and chemotherapy.  Low abdominal pain.  CT ABDOMEN AND PELVIS WITH CONTRAST  Technique:  Multidetector CT imaging of the abdomen and pelvis was performed following the standard protocol during bolus administration of intravenous contrast.  Contrast: OMNIPAQUE IOHEXOL 300 MG/ML  SOLN  Comparison: abdominal pelvic CT 11/18/2004.  PET CT 01/20/2012.  Findings: The lung bases are clear and there is no pleural effusion.  The liver, gallbladder, biliary system, pancreas and spleen demonstrate no significant findings.  There is no adrenal mass.  There are multiple low-density renal lesions bilaterally.  Some of these have enlarged from 2006, although they are most consistent with cysts.  There is no hydronephrosis.  There is diffuse wall thickening of the sigmoid colon with mild adjacent inflammatory change.  There is no extraluminal fluid collection.  The rectum and remainder of the colon appear normal. Aorto iliac atherosclerosis appears stable.  No large vessel occlusion is seen.  The bladder is mildly  trabeculated.  The prostate gland appears normal.  There are multiple osseous metastases which have enlarged from the PET CT.  For example, there is a 2.6 cm lytic lesion within the right iliac bone on image 49.  There is an approximately 4.3 cm lytic right sacral metastasis on image 54 and a 3.5 cm left iliac metastasis on image 58.  A fracture of the right eighth rib does not appear pathologic.  No pathologic fractures are identified.  IMPRESSION:  1.  Sigmoid colon wall thickening consistent with colitis.  This could be secondary to radiation therapy, infection or diverticulitis. 2.  Enlarging osseous metastases, primarily involving the pelvis as described.  No pathologic fracture identified. 3.  No extra osseous metastases identified.   Original Report Authenticated By: Carey Bullocks, M.D.    Dg Abd Acute W/chest  03/10/2012  *RADIOLOGY REPORT*  Clinical Data: Abdominal pain and nausea  ACUTE ABDOMEN SERIES (ABDOMEN 2 VIEW & CHEST 1 VIEW)  Comparison: Chest radiograph 02/08/2012  Findings: Normal cardiac silhouette.  No effusion, infiltrate, or pneumothorax.  Right-sided chest port noted.  There is no free air beneath the hemidiaphragms.  No dilated loops of large or small bowel.  IMPRESSION: 1.  No acute cardiopulmonary findings. 2.  No bowel obstruction intraperitoneal free air.   Original Report Authenticated By: Genevive Bi, M.D.     Impression/Plan: Doing well. Good palliative response to radiotherapy. It should be noted that although the most recent CT scans showed interval enlargement of the lytic lesions in his pelvic bones, this is compared to a PET CT scan that had been performed one month before initiation of his radiotherapy. Therefore, it should be acknowledged that the growth of these lesions may have taken place in the interval of time that preceded his radiation treatments. Most importantly, he had an excellent response to the radiotherapy in terms of pain control. No indications for  further radiotherapy at this time.  Doing well in terms of his colitis. He reports that he had an abrupt improvement when starting antibiotics. This favors that an infectious process may have been going on;  however, I did tell the patient there is a chance that he had an irritative subacute response to the radiotherapy in the pelvic region.  I will see him back PRN. I encourage the patient to call if they have any issues in the interim. We can certainly work him in as needed.  I spent 20 minutes minutes face to face with the patient and more than 50% of that time was spent in counseling and/or coordination of care. _____________________________________   Lonie Peak, MD

## 2012-04-11 ENCOUNTER — Other Ambulatory Visit (HOSPITAL_BASED_OUTPATIENT_CLINIC_OR_DEPARTMENT_OTHER): Payer: Medicare Other | Admitting: Lab

## 2012-04-11 ENCOUNTER — Ambulatory Visit: Payer: Medicare Other

## 2012-04-11 DIAGNOSIS — C3490 Malignant neoplasm of unspecified part of unspecified bronchus or lung: Secondary | ICD-10-CM

## 2012-04-11 DIAGNOSIS — C3410 Malignant neoplasm of upper lobe, unspecified bronchus or lung: Secondary | ICD-10-CM

## 2012-04-11 LAB — CBC WITH DIFFERENTIAL/PLATELET
BASO%: 0.6 % (ref 0.0–2.0)
EOS%: 0.6 % (ref 0.0–7.0)
MCH: 30.1 pg (ref 27.2–33.4)
MCHC: 32.8 g/dL (ref 32.0–36.0)
MCV: 91.8 fL (ref 79.3–98.0)
MONO%: 19.7 % — ABNORMAL HIGH (ref 0.0–14.0)
RBC: 2.79 10*6/uL — ABNORMAL LOW (ref 4.20–5.82)
RDW: 13.4 % (ref 11.0–14.6)
lymph#: 0.4 10*3/uL — ABNORMAL LOW (ref 0.9–3.3)

## 2012-04-11 NOTE — Progress Notes (Signed)
Per Dr. Arbutus Ped- hold treatment today due to low ANC and WBC.  Resume appts as scheduled on 2/10.  Pt notified and educated re: neutropenic precautions.

## 2012-04-15 ENCOUNTER — Ambulatory Visit (HOSPITAL_COMMUNITY)
Admission: RE | Admit: 2012-04-15 | Discharge: 2012-04-15 | Disposition: A | Discharge status: Home / Self Care | Payer: Medicare Other | Source: Ambulatory Visit | Attending: Internal Medicine | Admitting: Internal Medicine

## 2012-04-15 DIAGNOSIS — J984 Other disorders of lung: Secondary | ICD-10-CM | POA: Insufficient documentation

## 2012-04-15 DIAGNOSIS — K7689 Other specified diseases of liver: Secondary | ICD-10-CM | POA: Insufficient documentation

## 2012-04-15 DIAGNOSIS — I700 Atherosclerosis of aorta: Secondary | ICD-10-CM | POA: Insufficient documentation

## 2012-04-15 DIAGNOSIS — C7951 Secondary malignant neoplasm of bone: Secondary | ICD-10-CM | POA: Insufficient documentation

## 2012-04-15 DIAGNOSIS — C3490 Malignant neoplasm of unspecified part of unspecified bronchus or lung: Secondary | ICD-10-CM | POA: Insufficient documentation

## 2012-04-15 DIAGNOSIS — C7952 Secondary malignant neoplasm of bone marrow: Secondary | ICD-10-CM | POA: Insufficient documentation

## 2012-04-15 DIAGNOSIS — N289 Disorder of kidney and ureter, unspecified: Secondary | ICD-10-CM | POA: Insufficient documentation

## 2012-04-15 DIAGNOSIS — I2510 Atherosclerotic heart disease of native coronary artery without angina pectoris: Secondary | ICD-10-CM | POA: Insufficient documentation

## 2012-04-15 DIAGNOSIS — Z923 Personal history of irradiation: Secondary | ICD-10-CM | POA: Insufficient documentation

## 2012-04-15 DIAGNOSIS — Z9221 Personal history of antineoplastic chemotherapy: Secondary | ICD-10-CM | POA: Insufficient documentation

## 2012-04-15 MED ORDER — IOHEXOL 300 MG/ML  SOLN
80.0000 mL | Freq: Once | INTRAMUSCULAR | Status: AC | PRN
  Administered 2012-04-15: 80 mL via INTRAVENOUS

## 2012-04-18 ENCOUNTER — Encounter: Payer: Self-pay | Admitting: Internal Medicine

## 2012-04-18 ENCOUNTER — Ambulatory Visit (HOSPITAL_BASED_OUTPATIENT_CLINIC_OR_DEPARTMENT_OTHER): Payer: Medicare Other | Admitting: Internal Medicine

## 2012-04-18 ENCOUNTER — Other Ambulatory Visit (HOSPITAL_BASED_OUTPATIENT_CLINIC_OR_DEPARTMENT_OTHER): Payer: Medicare Other | Admitting: Lab

## 2012-04-18 ENCOUNTER — Telehealth: Payer: Self-pay | Admitting: *Deleted

## 2012-04-18 ENCOUNTER — Ambulatory Visit (HOSPITAL_BASED_OUTPATIENT_CLINIC_OR_DEPARTMENT_OTHER): Payer: Medicare Other

## 2012-04-18 VITALS — BP 145/67 | HR 88 | Temp 95.8°F | Resp 20 | Ht 67.0 in | Wt 153.7 lb

## 2012-04-18 DIAGNOSIS — C3410 Malignant neoplasm of upper lobe, unspecified bronchus or lung: Secondary | ICD-10-CM

## 2012-04-18 DIAGNOSIS — C3490 Malignant neoplasm of unspecified part of unspecified bronchus or lung: Secondary | ICD-10-CM

## 2012-04-18 DIAGNOSIS — C7951 Secondary malignant neoplasm of bone: Secondary | ICD-10-CM

## 2012-04-18 DIAGNOSIS — D696 Thrombocytopenia, unspecified: Secondary | ICD-10-CM

## 2012-04-18 DIAGNOSIS — Z5111 Encounter for antineoplastic chemotherapy: Secondary | ICD-10-CM

## 2012-04-18 LAB — CBC WITH DIFFERENTIAL/PLATELET
BASO%: 0.6 % (ref 0.0–2.0)
Eosinophils Absolute: 0 10*3/uL (ref 0.0–0.5)
MCHC: 32 g/dL (ref 32.0–36.0)
MONO#: 0.6 10*3/uL (ref 0.1–0.9)
MONO%: 17.1 % — ABNORMAL HIGH (ref 0.0–14.0)
NEUT#: 2.1 10*3/uL (ref 1.5–6.5)
RBC: 2.99 10*6/uL — ABNORMAL LOW (ref 4.20–5.82)
RDW: 16 % — ABNORMAL HIGH (ref 11.0–14.6)
WBC: 3.3 10*3/uL — ABNORMAL LOW (ref 4.0–10.3)
nRBC: 1 % — ABNORMAL HIGH (ref 0–0)

## 2012-04-18 LAB — COMPREHENSIVE METABOLIC PANEL (CC13)
ALT: 22 U/L (ref 0–55)
AST: 18 U/L (ref 5–34)
CO2: 26 mEq/L (ref 22–29)
Chloride: 110 mEq/L — ABNORMAL HIGH (ref 98–107)
Creatinine: 1.1 mg/dL (ref 0.7–1.3)
Sodium: 143 mEq/L (ref 136–145)
Total Bilirubin: 0.36 mg/dL (ref 0.20–1.20)
Total Protein: 6.2 g/dL — ABNORMAL LOW (ref 6.4–8.3)

## 2012-04-18 MED ORDER — SODIUM CHLORIDE 0.9 % IJ SOLN
10.0000 mL | INTRAMUSCULAR | Status: DC | PRN
  Administered 2012-04-18: 10 mL
  Filled 2012-04-18: qty 10

## 2012-04-18 MED ORDER — SODIUM CHLORIDE 0.9 % IV SOLN
473.4000 mg | Freq: Once | INTRAVENOUS | Status: AC
  Administered 2012-04-18: 470 mg via INTRAVENOUS
  Filled 2012-04-18: qty 47

## 2012-04-18 MED ORDER — PACLITAXEL PROTEIN-BOUND CHEMO INJECTION 100 MG
80.0000 mg/m2 | Freq: Once | INTRAVENOUS | Status: AC
  Administered 2012-04-18: 150 mg via INTRAVENOUS
  Filled 2012-04-18: qty 30

## 2012-04-18 MED ORDER — ONDANSETRON 16 MG/50ML IVPB (CHCC)
16.0000 mg | Freq: Once | INTRAVENOUS | Status: AC
  Administered 2012-04-18: 16 mg via INTRAVENOUS

## 2012-04-18 MED ORDER — DEXAMETHASONE SODIUM PHOSPHATE 4 MG/ML IJ SOLN
20.0000 mg | Freq: Once | INTRAMUSCULAR | Status: AC
  Administered 2012-04-18: 20 mg via INTRAVENOUS

## 2012-04-18 MED ORDER — SODIUM CHLORIDE 0.9 % IV SOLN
Freq: Once | INTRAVENOUS | Status: AC
  Administered 2012-04-18: 10:00:00 via INTRAVENOUS

## 2012-04-18 MED ORDER — HEPARIN SOD (PORK) LOCK FLUSH 100 UNIT/ML IV SOLN
500.0000 [IU] | Freq: Once | INTRAVENOUS | Status: AC | PRN
  Administered 2012-04-18: 500 [IU]
  Filled 2012-04-18: qty 5

## 2012-04-18 MED ORDER — MORPHINE SULFATE ER 15 MG PO TBCR: 15.0000 mg | EXTENDED_RELEASE_TABLET | Freq: Two times a day (BID) | ORAL | Status: DC

## 2012-04-18 NOTE — Patient Instructions (Signed)
CT of the chest showed significant improvement in your disease. Continue chemotherapy with carboplatin and Abraxane. Followup in 3 weeks.

## 2012-04-18 NOTE — Progress Notes (Signed)
[facility] [phone]   [phone]  OFFICE PROGRESS NOTE  [name], MD [address] [facility], [place]. 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   PRIOR THERAPY: Status post palliative radiotherapy to the right hip and sacrum under the care of Dr. Basilio Cairo.   CURRENT THERAPY:  systemic chemotherapy with carboplatin for AUC of 6 on day 1 and Abraxane 100 mg/M2 on days 1, 8 and 15 every 3 weeks. The patient is status post 3 cycles.  INTERVAL HISTORY: Joel Gibson 79 y.o. male returns to the clinic today for followup visit accompanied by his wife.  The patient is feeling fine today with no specific complaints except for mild fatigue. He tolerated the previous 3 cycle of his systemic chemotherapy with carboplatin and Abraxane fairly well except for pancytopenia which is currently resolved but platelets counts is still low. He denied having any significant weight loss or night sweats. He denied having any nausea or vomiting, no fever or chills. He had repeat CT scan of the chest performed recently and he is here for evaluation and discussion of his scan results.  MEDICAL HISTORY: Past Medical History  Diagnosis Date  . Hyperlipidemia   . Hypertension   . Aortic stenosis     mild to moderate  . Gastritis   . COPD (chronic obstructive pulmonary disease)   . Bronchitis   . Hepatitis     possible  . Hx of cataract surgery     lt eye  . Tobacco abuse   . Lung cancer 02/02/2012  . Shortness of breath     with activity  . Retinal vein occlusion 1990  . GERD (gastroesophageal reflux disease)   . Hemorrhoid   . Aortic stenosis   . Hypertension   . Dyslipidemia   . Glaucoma   . Right rib fracture      Pathological Fracture / 4 weeks ago  . Lung mass     Right Upper Lobe - Lobular Mass  . Bone  metastases     Widespread/ Lytic Lesion Left 7t Rib, Left Iliac Pelvis, ribs and scapula  . SOB (shortness of breath)     Mild  . Cough   . Stroke 2002    Right slight difference in vision  . S/P radiation therapy 02/22/12 - 03/07/12    Pelvis/Sacrum/30 Gray/10 Fractions and Left Scapula/Rib/ 8 Gray/1 Fraction   . On antineoplastic chemotherapy      Carboplatin/ Abraxane    ALLERGIES:  has No Known Allergies.  MEDICATIONS:  Current Outpatient Prescriptions  Medication Sig Dispense Refill  . albuterol (PROVENTIL HFA;VENTOLIN HFA) 108 (90 BASE) MCG/ACT inhaler Inhale 2 puffs into the lungs every 4 (four) hours as needed.      Marland Kitchen amLODipine (NORVASC) 10 MG tablet Take 10 mg by mouth daily. Per Dr. Caryn Section patient to hold this medication unless/until BP is > 150/110      . aspirin EC 81 MG tablet Take 81 mg by mouth every other day. Takes along with Aggrenox      . Aspirin-Dipyridamole (AGGRENOX PO) Take 25-200 mg by mouth 2 (two) times daily. Takes 2 tabs one day alt. with 1 tab next day,etc.      . betaxolol (BETOPTIC-S) 0.25 % ophthalmic suspension Place 1 drop into both eyes daily. One drop in the left eye in the am....one drop  in the right eye in the am and in the pm.      . irbesartan (AVAPRO) 300 MG tablet Take 300 mg by mouth daily.       Marland Kitchen lidocaine-prilocaine (EMLA) cream Apply 1 application topically as needed.      Marland Kitchen morphine (MS CONTIN) 15 MG 12 hr tablet Take 1 tablet (15 mg total) by mouth 2 (two) times daily.  60 tablet  0  . Multiple Vitamin (MULTIVITAMIN PO) Take 1 tablet by mouth daily.        . niacin (NIASPAN) 500 MG CR tablet Take 500 mg by mouth at bedtime.       . prochlorperazine (COMPAZINE) 10 MG tablet Take 10 mg by mouth every 6 (six) hours as needed. For nausea      . Ranitidine HCl ([employer]S RANITIDINE PO) Take 150 mg by mouth daily.       . rosuvastatin (CRESTOR) 40 MG tablet Take 20 mg by mouth daily.       Marland Kitchen senna (SENOKOT) 8.6 MG TABS Take 1 tablet by mouth.      .  tiotropium (SPIRIVA) 18 MCG inhalation capsule Place 18 mcg into inhaler and inhale daily.         No current facility-administered medications for this visit.    SURGICAL HISTORY:  Past Surgical History  Procedure Laterality Date  . Vasectomy    . Tonsillectomy    . Leg surgery      rt for nerve impingement  . Collarbone fracture      1977  . Tonsillectomy    . Eye surgery      catarct bil  . Portacath placement  02/08/2012    Procedure: INSERTION PORT-A-CATH;  Surgeon: [name], MD;  Location: [facility] OR;  Service: Thoracic;  Laterality: Right;  . Ct guided core biopsy  01/26/12    Left Lytic Lesion - Metastatic Squamous Cell Carcinoma  . 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 Resp: clear to auscultation bilaterally Cardio: regular rate and rhythm, S1, S2 normal, no murmur, click, rub or gallop GI: soft, non-tender; bowel sounds normal; no masses,  no organomegaly Extremities: extremities normal, atraumatic, no cyanosis or edema Neurologic: Alert and oriented X 3, normal strength and tone. Normal symmetric reflexes. Normal coordination and gait  ECOG PERFORMANCE STATUS: 1 - Symptomatic but completely ambulatory  Blood pressure 145/67, pulse 88, temperature 95.8 F (35.4 C), temperature source Oral, resp. rate 20, height 5\' 7"  (1.702 m), weight 153 lb 11.2 oz (69.718 kg).  LABORATORY DATA: Lab Results  Component Value Date   WBC 3.3* 04/18/2012   HGB 8.9* 04/18/2012   HCT 27.8* 04/18/2012   MCV 93.0 04/18/2012   PLT 91* 04/18/2012      Chemistry      Component Value  Date/Time   NA 140 04/04/2012 1041   NA 137 03/10/2012 1804   K 3.9 04/04/2012 1041   K 3.7 03/10/2012 1804   CL 106 04/04/2012 1041   CL 103 03/10/2012 1804   CO2 24 04/04/2012 1041   CO2 26 03/10/2012 1804   BUN 19.5 04/04/2012 1041  BUN 25* 03/10/2012 1804   CREATININE 1.1 04/04/2012 1041   CREATININE 1.04 03/10/2012 1804      Component Value Date/Time   CALCIUM 8.6 04/04/2012 1041   CALCIUM 7.9* 03/10/2012 1804   ALKPHOS 74 04/04/2012 1041   ALKPHOS 67 03/10/2012 1804   AST 14 04/04/2012 1041   AST 11 03/10/2012 1804   ALT 15 04/04/2012 1041   ALT 15 03/10/2012 1804   BILITOT 0.40 04/04/2012 1041   BILITOT 0.3 03/10/2012 1804       RADIOGRAPHIC STUDIES: Dg Femur Right  03/28/2012  *RADIOLOGY REPORT*  Clinical Data: Right hip and femur pain.  RIGHT FEMUR - 2 VIEW  Comparison: None.  Findings: Right femur is intact.  Right hip joint is unremarkable.  IMPRESSION: Negative.   Original Report Authenticated By: Leanna Battles, M.D.    Ct Chest W Contrast  04/15/2012  *RADIOLOGY REPORT*  Clinical Data: Lung cancer diagnosed in August 2012.  Restaging post completion of chemotherapy and radiation therapy.  CT CHEST WITH CONTRAST  Technique:  Multidetector CT imaging of the chest was performed following the standard protocol during bolus administration of intravenous contrast.  Contrast: 80mL OMNIPAQUE IOHEXOL 300 MG/ML  SOLN  Comparison: PET CT 01/20/2012.  Chest CT 01/05/2012.  Findings: There are no enlarged mediastinal or hilar lymph nodes. Small mediastinal lymph nodes are unchanged.  Right subclavian Port- A-Cath tip is in the lower SVC.  There is moderate atherosclerosis of the aorta, great vessels and coronary arteries.  The previously demonstrated right apical mass is smaller and less dense.  This measures 2.6 x 2.2 cm on image 14 (previously 3.3 x 2.2 cm).  No new or enlarging pulmonary nodules are identified. Moderate emphysematous changes are stable.  The visualized upper abdomen has a stable appearance with  small low- density renal lesions bilaterally.  There is no adrenal mass.  The previously demonstrated lytic lesion involving the left seventh rib laterally is no longer seen.  There is a healing fracture in this area.  There are healing fractures of the right seventh and eighth ribs laterally. The lytic lesion involving the inferior tip of the left scapula is unchanged.  No new osseous lesions are identified.  IMPRESSION:  1.  Interval decreased size and density of the right upper lobe mass consistent with response to therapy. 2.  No evidence of nodal metastasis. 3.  Previously demonstrated metastases to the left seventh rib and scapula are stable to improved.  No new osseous metastases identified.   Original Report Authenticated By: Carey Bullocks, M.D.     ASSESSMENT: This is a very pleasant 79 years old white male with metastatic non-small cell lung cancer, squamous cell carcinoma currently on systemic chemotherapy with carboplatin and Abraxane status post 3 cycles with significant improvement in his disease.  PLAN: I discussed the scan results with the patient his wife and showed them the images. I recommended for the patient to continue on the same systemic chemotherapy with carboplatin and Abraxane. He was started cycle #4 today. The patient would come back for followup visit in 3 weeks with the next cycle of his treatment. He was advised to call immediately if he has any concerning symptoms in the interval  All questions were answered. The patient knows to call the clinic with any problems, questions or concerns. We can certainly see the patient much sooner if necessary.  I spent 15 minutes counseling the patient face to face. The total time spent in the appointment was 25 minutes.

## 2012-04-18 NOTE — Progress Notes (Signed)
Ok to tx notes 04-18-12.  dmr

## 2012-04-18 NOTE — Telephone Encounter (Signed)
Per staff message and POF I have scheduled appts.  JMW  

## 2012-04-18 NOTE — Patient Instructions (Addendum)
[facility] Discharge Instructions for Patients Receiving Chemotherapy  Today you received the following chemotherapy agents abraxane/carboplatin  To help prevent nausea and vomiting after your treatment, we encourage you to take your nausea medication   and take it as often as prescribed .  If you develop nausea and vomiting that is not controlled by your nausea medication, call the clinic. If it is after clinic hours your family physician or the after hours number for the clinic or go to the Emergency Department.   BELOW ARE SYMPTOMS THAT SHOULD BE REPORTED IMMEDIATELY:  *FEVER GREATER THAN 100.5 F  *CHILLS WITH OR WITHOUT FEVER  NAUSEA AND VOMITING THAT IS NOT CONTROLLED WITH YOUR NAUSEA MEDICATION  *UNUSUAL SHORTNESS OF BREATH  *UNUSUAL BRUISING OR BLEEDING  TENDERNESS IN MOUTH AND THROAT WITH OR WITHOUT PRESENCE OF ULCERS  *URINARY PROBLEMS  *BOWEL PROBLEMS  UNUSUAL RASH Items with * indicate a potential emergency and should be followed up as soon as possible.  One of the nurses will contact you 24 hours after your treatment. Please let the nurse know about any problems that you may have experienced. Feel free to call the clinic you have any questions or concerns. The clinic phone number is [phone].   I have been informed and understand all the instructions given to me. I know to contact the clinic, my physician, or go to the Emergency Department if any problems should occur. I do not have any questions at this time, but understand that I may call the clinic during office hours   should I have any questions or need assistance in obtaining follow up care.    __________________________________________  _____________  __________ Signature of Patient or Authorized Representative            Date                   Time    __________________________________________ Nurse's Signature

## 2012-04-23 ENCOUNTER — Other Ambulatory Visit: Payer: Self-pay

## 2012-04-25 ENCOUNTER — Telehealth: Payer: Self-pay | Admitting: Medical Oncology

## 2012-04-25 ENCOUNTER — Ambulatory Visit (HOSPITAL_BASED_OUTPATIENT_CLINIC_OR_DEPARTMENT_OTHER): Payer: Medicare Other

## 2012-04-25 ENCOUNTER — Other Ambulatory Visit (HOSPITAL_BASED_OUTPATIENT_CLINIC_OR_DEPARTMENT_OTHER): Payer: Medicare Other | Admitting: Lab

## 2012-04-25 VITALS — BP 151/63 | HR 92 | Temp 97.3°F | Resp 20

## 2012-04-25 DIAGNOSIS — C3490 Malignant neoplasm of unspecified part of unspecified bronchus or lung: Secondary | ICD-10-CM

## 2012-04-25 DIAGNOSIS — C3410 Malignant neoplasm of upper lobe, unspecified bronchus or lung: Secondary | ICD-10-CM

## 2012-04-25 DIAGNOSIS — Z5111 Encounter for antineoplastic chemotherapy: Secondary | ICD-10-CM

## 2012-04-25 DIAGNOSIS — C7952 Secondary malignant neoplasm of bone marrow: Secondary | ICD-10-CM

## 2012-04-25 LAB — CBC WITH DIFFERENTIAL/PLATELET
BASO%: 0.4 % (ref 0.0–2.0)
EOS%: 0.6 % (ref 0.0–7.0)
HCT: 26.8 % — ABNORMAL LOW (ref 38.4–49.9)
LYMPH%: 13.6 % — ABNORMAL LOW (ref 14.0–49.0)
MCH: 31.1 pg (ref 27.2–33.4)
MCHC: 33.6 g/dL (ref 32.0–36.0)
MONO#: 0.6 10*3/uL (ref 0.1–0.9)
MONO%: 12.1 % (ref 0.0–14.0)
NEUT%: 73.3 % (ref 39.0–75.0)
Platelets: 83 10*3/uL — ABNORMAL LOW (ref 140–400)
RBC: 2.89 10*6/uL — ABNORMAL LOW (ref 4.20–5.82)
WBC: 4.6 10*3/uL (ref 4.0–10.3)
nRBC: 0 % (ref 0–0)

## 2012-04-25 LAB — COMPREHENSIVE METABOLIC PANEL (CC13)
ALT: 19 U/L (ref 0–55)
AST: 16 U/L (ref 5–34)
Alkaline Phosphatase: 77 U/L (ref 40–150)
BUN: 19.6 mg/dL (ref 7.0–26.0)
Calcium: 8.8 mg/dL (ref 8.4–10.4)
Creatinine: 1 mg/dL (ref 0.7–1.3)
Total Bilirubin: 0.47 mg/dL (ref 0.20–1.20)

## 2012-04-25 MED ORDER — SODIUM CHLORIDE 0.9 % IJ SOLN
10.0000 mL | INTRAMUSCULAR | Status: DC | PRN
  Administered 2012-04-25: 10 mL
  Filled 2012-04-25: qty 10

## 2012-04-25 MED ORDER — PACLITAXEL PROTEIN-BOUND CHEMO INJECTION 100 MG
80.0000 mg/m2 | Freq: Once | INTRAVENOUS | Status: AC
  Administered 2012-04-25: 150 mg via INTRAVENOUS
  Filled 2012-04-25: qty 30

## 2012-04-25 MED ORDER — HEPARIN SOD (PORK) LOCK FLUSH 100 UNIT/ML IV SOLN
500.0000 [IU] | Freq: Once | INTRAVENOUS | Status: AC | PRN
  Administered 2012-04-25: 500 [IU]
  Filled 2012-04-25: qty 5

## 2012-04-25 MED ORDER — SODIUM CHLORIDE 0.9 % IV SOLN
Freq: Once | INTRAVENOUS | Status: AC
  Administered 2012-04-25: 13:00:00 via INTRAVENOUS

## 2012-04-25 MED ORDER — ONDANSETRON 8 MG/50ML IVPB (CHCC)
8.0000 mg | Freq: Once | INTRAVENOUS | Status: AC
  Administered 2012-04-25: 8 mg via INTRAVENOUS

## 2012-04-25 MED ORDER — DEXAMETHASONE SODIUM PHOSPHATE 10 MG/ML IJ SOLN
10.0000 mg | Freq: Once | INTRAMUSCULAR | Status: AC
  Administered 2012-04-25: 10 mg via INTRAVENOUS

## 2012-04-25 NOTE — Progress Notes (Signed)
OK to treat with current CBC 

## 2012-04-25 NOTE — Patient Instructions (Signed)
[facility] Discharge Instructions for Patients Receiving Chemotherapy  Today you received the following chemotherapy agents Abraxane    If you develop nausea and vomiting that is not controlled by your nausea medication, call the clinic. If it is after clinic hours your family physician or the after hours number for the clinic or go to the Emergency Department.   BELOW ARE SYMPTOMS THAT SHOULD BE REPORTED IMMEDIATELY:  *FEVER GREATER THAN 100.5 F  *CHILLS WITH OR WITHOUT FEVER  NAUSEA AND VOMITING THAT IS NOT CONTROLLED WITH YOUR NAUSEA MEDICATION  *UNUSUAL SHORTNESS OF BREATH  *UNUSUAL BRUISING OR BLEEDING  TENDERNESS IN MOUTH AND THROAT WITH OR WITHOUT PRESENCE OF ULCERS  *URINARY PROBLEMS  *BOWEL PROBLEMS  UNUSUAL RASH Items with * indicate a potential emergency and should be followed up as soon as possible.  One of the nurses will contact you 24 hours after your treatment. Please let the nurse know about any problems that you may have experienced. Feel free to call the clinic you have any questions or concerns. The clinic phone number is [phone].   I have been informed and understand all the instructions given to me. I know to contact the clinic, my physician, or go to the Emergency Department if any problems should occur. I do not have any questions at this time, but understand that I may call the clinic during office hours   should I have any questions or need assistance in obtaining follow up care.    __________________________________________  _____________  __________ Signature of Patient or Authorized Representative            Date                   Time    __________________________________________ Nurse's Signature    

## 2012-04-25 NOTE — Telephone Encounter (Signed)
erroneous

## 2012-04-28 LAB — IFOBT (OCCULT BLOOD): IFOBT: POSITIVE

## 2012-05-02 ENCOUNTER — Ambulatory Visit (HOSPITAL_BASED_OUTPATIENT_CLINIC_OR_DEPARTMENT_OTHER): Payer: Medicare Other

## 2012-05-02 ENCOUNTER — Telehealth: Payer: Self-pay | Admitting: *Deleted

## 2012-05-02 ENCOUNTER — Other Ambulatory Visit (HOSPITAL_BASED_OUTPATIENT_CLINIC_OR_DEPARTMENT_OTHER): Payer: Medicare Other | Admitting: Lab

## 2012-05-02 VITALS — BP 139/65 | HR 89 | Temp 97.4°F | Resp 18

## 2012-05-02 DIAGNOSIS — C3490 Malignant neoplasm of unspecified part of unspecified bronchus or lung: Secondary | ICD-10-CM

## 2012-05-02 DIAGNOSIS — C7951 Secondary malignant neoplasm of bone: Secondary | ICD-10-CM

## 2012-05-02 DIAGNOSIS — C3410 Malignant neoplasm of upper lobe, unspecified bronchus or lung: Secondary | ICD-10-CM

## 2012-05-02 LAB — COMPREHENSIVE METABOLIC PANEL (CC13)
ALT: 14 U/L (ref 0–55)
AST: 11 U/L (ref 5–34)
BUN: 16.2 mg/dL (ref 7.0–26.0)
Chloride: 110 mEq/L — ABNORMAL HIGH (ref 98–107)
Creatinine: 1 mg/dL (ref 0.7–1.3)
Glucose: 123 mg/dl — ABNORMAL HIGH (ref 70–99)
Potassium: 3.6 mEq/L (ref 3.5–5.1)
Total Bilirubin: 0.34 mg/dL (ref 0.20–1.20)
Total Protein: 5.7 g/dL — ABNORMAL LOW (ref 6.4–8.3)

## 2012-05-02 LAB — CBC WITH DIFFERENTIAL/PLATELET
Basophils Absolute: 0 10*3/uL (ref 0.0–0.1)
EOS%: 1 % (ref 0.0–7.0)
HCT: 23.4 % — ABNORMAL LOW (ref 38.4–49.9)
HGB: 7.8 g/dL — ABNORMAL LOW (ref 13.0–17.1)
LYMPH%: 30.1 % (ref 14.0–49.0)
MCH: 31.2 pg (ref 27.2–33.4)
MCHC: 33.3 g/dL (ref 32.0–36.0)
NEUT%: 50 % (ref 39.0–75.0)
Platelets: 64 10*3/uL — ABNORMAL LOW (ref 140–400)
lymph#: 0.6 10*3/uL — ABNORMAL LOW (ref 0.9–3.3)

## 2012-05-02 MED ORDER — HEPARIN SOD (PORK) LOCK FLUSH 100 UNIT/ML IV SOLN
500.0000 [IU] | Freq: Once | INTRAVENOUS | Status: AC | PRN
  Administered 2012-05-02: 500 [IU]
  Filled 2012-05-02: qty 5

## 2012-05-02 MED ORDER — SODIUM CHLORIDE 0.9 % IV SOLN
Freq: Once | INTRAVENOUS | Status: DC
  Administered 2012-05-02: 13:00:00 via INTRAVENOUS

## 2012-05-02 MED ORDER — ONDANSETRON 8 MG/50ML IVPB (CHCC)
8.0000 mg | Freq: Once | INTRAVENOUS | Status: DC
  Administered 2012-05-02: 8 mg via INTRAVENOUS

## 2012-05-02 MED ORDER — DEXAMETHASONE SODIUM PHOSPHATE 10 MG/ML IJ SOLN
10.0000 mg | Freq: Once | INTRAMUSCULAR | Status: DC
  Administered 2012-05-02: 10 mg via INTRAVENOUS

## 2012-05-02 MED ORDER — PACLITAXEL PROTEIN-BOUND CHEMO INJECTION 100 MG: 80.0000 mg/m2 | Freq: Once | INTRAVENOUS | Status: DC

## 2012-05-02 MED ORDER — SODIUM CHLORIDE 0.9 % IJ SOLN
10.0000 mL | INTRAMUSCULAR | Status: DC | PRN
  Administered 2012-05-02: 10 mL
  Filled 2012-05-02: qty 10

## 2012-05-02 NOTE — Progress Notes (Signed)
Per Dr. Arbutus Ped, no treatment today d/t CBC.

## 2012-05-02 NOTE — Telephone Encounter (Signed)
Progress note, labwork from Dr Felipa Eth at [facility] given to Dr Donnald Garre to review.  SLJ

## 2012-05-09 ENCOUNTER — Telehealth: Payer: Self-pay | Admitting: Internal Medicine

## 2012-05-09 ENCOUNTER — Encounter: Payer: Self-pay | Admitting: Physician Assistant

## 2012-05-09 ENCOUNTER — Ambulatory Visit: Payer: Medicare Other

## 2012-05-09 ENCOUNTER — Other Ambulatory Visit: Payer: Self-pay | Admitting: Medical Oncology

## 2012-05-09 ENCOUNTER — Ambulatory Visit (HOSPITAL_BASED_OUTPATIENT_CLINIC_OR_DEPARTMENT_OTHER): Payer: Medicare Other | Admitting: Physician Assistant

## 2012-05-09 ENCOUNTER — Other Ambulatory Visit (HOSPITAL_BASED_OUTPATIENT_CLINIC_OR_DEPARTMENT_OTHER): Payer: Medicare Other | Admitting: Lab

## 2012-05-09 ENCOUNTER — Telehealth: Payer: Self-pay | Admitting: *Deleted

## 2012-05-09 DIAGNOSIS — C3410 Malignant neoplasm of upper lobe, unspecified bronchus or lung: Secondary | ICD-10-CM

## 2012-05-09 DIAGNOSIS — C3490 Malignant neoplasm of unspecified part of unspecified bronchus or lung: Secondary | ICD-10-CM

## 2012-05-09 DIAGNOSIS — C7951 Secondary malignant neoplasm of bone: Secondary | ICD-10-CM

## 2012-05-09 LAB — CBC WITH DIFFERENTIAL/PLATELET
BASO%: 0 % (ref 0.0–2.0)
EOS%: 1 % (ref 0.0–7.0)
HCT: 25.6 % — ABNORMAL LOW (ref 38.4–49.9)
LYMPH%: 23.9 % (ref 14.0–49.0)
MCH: 32 pg (ref 27.2–33.4)
MCHC: 32.4 g/dL (ref 32.0–36.0)
MCV: 98.8 fL — ABNORMAL HIGH (ref 79.3–98.0)
MONO#: 0.7 10*3/uL (ref 0.1–0.9)
MONO%: 22.9 % — ABNORMAL HIGH (ref 0.0–14.0)
NEUT%: 52.2 % (ref 39.0–75.0)
Platelets: 68 10*3/uL (ref <2.0)

## 2012-05-09 NOTE — Patient Instructions (Addendum)
Your platelet count is subtherapeutic proceed with chemotherapy as scheduled today We'll reschedule your laboratory appointments and chemotherapy for 05/16/2012 Followup in 4 weeks prior to the start of your next scheduled cycle of chemotherapy

## 2012-05-09 NOTE — Telephone Encounter (Signed)
gv and printed pt appt schedule for march...emailed michellel to add tx....pt aware

## 2012-05-09 NOTE — Telephone Encounter (Signed)
Per staff message and POF I have scheduled appts.  JMW  

## 2012-05-09 NOTE — Progress Notes (Addendum)
[facility] [phone]   [phone]  OFFICE PROGRESS NOTE  [name], MD [address] [facility], [place]. 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   PRIOR THERAPY: Status post palliative radiotherapy to the right hip and sacrum under the care of Dr. Basilio Cairo.   CURRENT THERAPY:  systemic chemotherapy with carboplatin for AUC of 6 on day 1 and Abraxane 100 mg/M2 on days 1, 8 and 15 every 3 weeks. The patient is status post 4 cycles.  INTERVAL HISTORY: CONN TROMBETTA 79 y.o. male returns to the clinic today for followup visit accompanied by his wife.  He complains of "restless legs however upon further questioning it is actually bilateral heel pain that is uncomfortable and bothers him mostly at night making it difficult for him to find a comfortable position. He sits in a recliner with his feet up with most of the pressure on his heels and this may be the problem. He notes some increase shortness of breath with his activities of daily living but also has a history of COPD. He is concerned about his history of bone metastasis in his pelvic region, status post radiation therapy, however he denies any pain at all. He has in fact not been taking any pain medication at all. He voices no other specific complaints except for mild fatigue. He tolerated the previous 4 cycle of his systemic chemotherapy with carboplatin and Abraxane fairly well except for pancytopenia which is currently resolved but platelets counts is still low. He denied having any significant weight loss or night sweats. He denied having any nausea or vomiting, no fever or chills.  MEDICAL HISTORY: Past Medical History  Diagnosis Date  . Hyperlipidemia   . Hypertension   . Aortic stenosis     mild to moderate  . Gastritis   . COPD (chronic obstructive  pulmonary disease)   . Bronchitis   . Hepatitis     possible  . Hx of cataract surgery     lt eye  . Tobacco abuse   . Lung cancer 02/02/2012  . Shortness of breath     with activity  . Retinal vein occlusion 1990  . GERD (gastroesophageal reflux disease)   . Hemorrhoid   . Aortic stenosis   . Hypertension   . Dyslipidemia   . Glaucoma   . Right rib fracture      Pathological Fracture / 4 weeks ago  . Lung mass     Right Upper Lobe - Lobular Mass  . Bone metastases     Widespread/ Lytic Lesion Left 7t Rib, Left Iliac Pelvis, ribs and scapula  . SOB (shortness of breath)     Mild  . Cough   . Stroke 2002    Right slight difference in vision  . S/P radiation therapy 02/22/12 - 03/07/12    Pelvis/Sacrum/30 Gray/10 Fractions and Left Scapula/Rib/ 8 Gray/1 Fraction   . On antineoplastic chemotherapy      Carboplatin/ Abraxane    ALLERGIES:  has No Known Allergies.  MEDICATIONS:  Current Outpatient Prescriptions  Medication Sig Dispense Refill  . omeprazole (PRILOSEC) 20 MG capsule Take 20 mg by mouth daily. Per Dr. Virgel Manifold      . albuterol (PROVENTIL HFA;VENTOLIN HFA) 108 (90 BASE) MCG/ACT inhaler Inhale 2 puffs into the lungs every 4 (four) hours as needed.      Marland Kitchen  amLODipine (NORVASC) 10 MG tablet Take 10 mg by mouth daily. Per Dr. Caryn Section patient to hold this medication unless/until BP is > 150/110      . aspirin EC 81 MG tablet Take 81 mg by mouth every other day. Takes along with Aggrenox      . Aspirin-Dipyridamole (AGGRENOX PO) Take 25-200 mg by mouth 2 (two) times daily. Takes 2 tabs one day alt. with 1 tab next day,etc.      . betaxolol (BETOPTIC-S) 0.25 % ophthalmic suspension Place 1 drop into both eyes daily. One drop in the left eye in the am....one drop in the right eye in the am and in the pm.      . irbesartan (AVAPRO) 300 MG tablet Take 300 mg by mouth daily.       Marland Kitchen lidocaine-prilocaine (EMLA) cream Apply 1 application topically as needed.      . Multiple Vitamin  (MULTIVITAMIN PO) Take 1 tablet by mouth daily.        . niacin (NIASPAN) 500 MG CR tablet Take 500 mg by mouth at bedtime.       . prochlorperazine (COMPAZINE) 10 MG tablet Take 10 mg by mouth every 6 (six) hours as needed. For nausea      . rosuvastatin (CRESTOR) 40 MG tablet Take 20 mg by mouth daily.       Marland Kitchen senna (SENOKOT) 8.6 MG TABS Take 1 tablet by mouth daily as needed.       . tiotropium (SPIRIVA) 18 MCG inhalation capsule Place 18 mcg into inhaler and inhale daily.         No current facility-administered medications for this visit.    SURGICAL HISTORY:  Past Surgical History  Procedure Laterality Date  . Vasectomy    . Tonsillectomy    . Leg surgery      rt for nerve impingement  . Collarbone fracture      1977  . Tonsillectomy    . Eye surgery      catarct bil  . Portacath placement  02/08/2012    Procedure: INSERTION PORT-A-CATH;  Surgeon: [name], MD;  Location: [facility] OR;  Service: Thoracic;  Laterality: Right;  . Ct guided core biopsy  01/26/12    Left Lytic Lesion - Metastatic Squamous Cell Carcinoma  . 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   PHYSICAL EXAMINATION: General appearance: alert, cooperative and no distress Head: Normocephalic, without obvious abnormality, atraumatic Neck: no adenopathy Lymph nodes: Cervical, supraclavicular, and axillary nodes normal. Resp: clear to auscultation bilaterally Cardio: regular rate and rhythm, S1, S2 normal, no murmur, click, rub or gallop GI: soft, non-tender; bowel sounds normal; no masses,  no organomegaly Extremities: extremities normal, atraumatic, no cyanosis or edema Neurologic: Alert and oriented X 3, normal strength and tone. Normal symmetric reflexes. Normal coordination and gait  ECOG PERFORMANCE STATUS: 1 - Symptomatic but completely ambulatory  Blood pressure 156/74, pulse 106,  temperature 97.5 F (36.4 C), temperature source Oral, resp. rate 18, height 5\' 7"  (1.702 m), weight 153 lb 4.8 oz (69.536 kg).  LABORATORY DATA: Lab Results  Component Value Date   WBC 2.9* 05/09/2012   HGB 8.3* 05/09/2012   HCT 25.6* 05/09/2012   MCV 98.8* 05/09/2012   PLT 68 05/09/2012      Chemistry      Component Value  Date/Time   NA 141 05/02/2012 1156   NA 137 03/10/2012 1804   K 3.6 05/02/2012 1156   K 3.7 03/10/2012 1804   CL 110* 05/02/2012 1156   CL 103 03/10/2012 1804   CO2 24 05/02/2012 1156   CO2 26 03/10/2012 1804   BUN 16.2 05/02/2012 1156   BUN 25* 03/10/2012 1804   CREATININE 1.0 05/02/2012 1156   CREATININE 1.04 03/10/2012 1804      Component Value Date/Time   CALCIUM 8.2* 05/02/2012 1156   CALCIUM 7.9* 03/10/2012 1804   ALKPHOS 69 05/02/2012 1156   ALKPHOS 67 03/10/2012 1804   AST 11 05/02/2012 1156   AST 11 03/10/2012 1804   ALT 14 05/02/2012 1156   ALT 15 03/10/2012 1804   BILITOT 0.34 05/02/2012 1156   BILITOT 0.3 03/10/2012 1804       RADIOGRAPHIC STUDIES: Dg Femur Right  03/28/2012  *RADIOLOGY REPORT*  Clinical Data: Right hip and femur pain.  RIGHT FEMUR - 2 VIEW  Comparison: None.  Findings: Right femur is intact.  Right hip joint is unremarkable.  IMPRESSION: Negative.   Original Report Authenticated By: Leanna Battles, M.D.    Ct Chest W Contrast  04/15/2012  *RADIOLOGY REPORT*  Clinical Data: Lung cancer diagnosed in August 2012.  Restaging post completion of chemotherapy and radiation therapy.  CT CHEST WITH CONTRAST  Technique:  Multidetector CT imaging of the chest was performed following the standard protocol during bolus administration of intravenous contrast.  Contrast: 80mL OMNIPAQUE IOHEXOL 300 MG/ML  SOLN  Comparison: PET CT 01/20/2012.  Chest CT 01/05/2012.  Findings: There are no enlarged mediastinal or hilar lymph nodes. Small mediastinal lymph nodes are unchanged.  Right subclavian Port- A-Cath tip is in the lower SVC.  There is moderate atherosclerosis of the aorta,  great vessels and coronary arteries.  The previously demonstrated right apical mass is smaller and less dense.  This measures 2.6 x 2.2 cm on image 14 (previously 3.3 x 2.2 cm).  No new or enlarging pulmonary nodules are identified. Moderate emphysematous changes are stable.  The visualized upper abdomen has a stable appearance with small low- density renal lesions bilaterally.  There is no adrenal mass.  The previously demonstrated lytic lesion involving the left seventh rib laterally is no longer seen.  There is a healing fracture in this area.  There are healing fractures of the right seventh and eighth ribs laterally. The lytic lesion involving the inferior tip of the left scapula is unchanged.  No new osseous lesions are identified.  IMPRESSION:  1.  Interval decreased size and density of the right upper lobe mass consistent with response to therapy. 2.  No evidence of nodal metastasis. 3.  Previously demonstrated metastases to the left seventh rib and scapula are stable to improved.  No new osseous metastases identified.   Original Report Authenticated By: Carey Bullocks, M.D.     ASSESSMENT/PLAN: This is a very pleasant 79 years old white male with metastatic non-small cell lung cancer, squamous cell carcinoma currently on systemic chemotherapy with carboplatin and Abraxane status post 4 cycles with significant improvement in his disease. The patient was discussed with Dr. Arbutus Ped. Unfortunately his platelet count 68,000 is subtherapeutic for him to proceed with chemotherapy today as scheduled. We will reschedule his laboratory appointment in chemotherapy for 05/16/2012. He will followup in 4 weeks prior to cycle #6 with a repeat CBC differential and C. met. We have suggested that he placed a total underneath his lower calf area  to raise his heels off of the recliner so that they are not the primary point of pressure and see if this believes his symptoms. He will let us know if this was helpful when he  returns for his next followup visit. If his shortness breath with his activities of daily living persists or worsens he is advised to followup with his pulmonologist. Should he develop back, hip or pelvic bone pain he is to notify us immediately.  JOHNSON, ADRENA E, PA-C   He was advised to call immediately if he has any concerning symptoms in the interval  All questions were answered. The patient knows to call the clinic with any problems, questions or concerns. We can certainly see the patient much sooner if necessary.  I spent 20 minutes counseling the patient face to face. The total time spent in the appointment was 30 minutes.

## 2012-05-10 ENCOUNTER — Encounter: Payer: Self-pay | Admitting: *Deleted

## 2012-05-16 ENCOUNTER — Ambulatory Visit: Payer: Medicare Other

## 2012-05-16 ENCOUNTER — Other Ambulatory Visit (HOSPITAL_BASED_OUTPATIENT_CLINIC_OR_DEPARTMENT_OTHER): Payer: Medicare Other

## 2012-05-16 ENCOUNTER — Other Ambulatory Visit: Payer: Self-pay | Admitting: *Deleted

## 2012-05-16 DIAGNOSIS — C3410 Malignant neoplasm of upper lobe, unspecified bronchus or lung: Secondary | ICD-10-CM

## 2012-05-16 LAB — CBC WITH DIFFERENTIAL/PLATELET
Basophils Absolute: 0 10*3/uL (ref 0.0–0.1)
EOS%: 1.4 % (ref 0.0–7.0)
HCT: 28 % — ABNORMAL LOW (ref 38.4–49.9)
HGB: 9.1 g/dL — ABNORMAL LOW (ref 13.0–17.1)
MCH: 33.5 pg — ABNORMAL HIGH (ref 27.2–33.4)
MCV: 102.9 fL — ABNORMAL HIGH (ref 79.3–98.0)
NEUT%: 62.7 % (ref 39.0–75.0)
lymph#: 0.9 10*3/uL (ref 0.9–3.3)

## 2012-05-16 NOTE — Progress Notes (Signed)
Treatment held today due to CBC. R/S to next week. POF to scheduling. Pt made aware.

## 2012-05-23 ENCOUNTER — Ambulatory Visit (HOSPITAL_BASED_OUTPATIENT_CLINIC_OR_DEPARTMENT_OTHER): Payer: Medicare Other

## 2012-05-23 ENCOUNTER — Other Ambulatory Visit (HOSPITAL_BASED_OUTPATIENT_CLINIC_OR_DEPARTMENT_OTHER): Payer: Medicare Other | Admitting: Lab

## 2012-05-23 ENCOUNTER — Other Ambulatory Visit: Payer: Self-pay | Admitting: Internal Medicine

## 2012-05-23 DIAGNOSIS — Z5111 Encounter for antineoplastic chemotherapy: Secondary | ICD-10-CM

## 2012-05-23 DIAGNOSIS — C3410 Malignant neoplasm of upper lobe, unspecified bronchus or lung: Secondary | ICD-10-CM

## 2012-05-23 DIAGNOSIS — C7951 Secondary malignant neoplasm of bone: Secondary | ICD-10-CM

## 2012-05-23 LAB — CBC WITH DIFFERENTIAL/PLATELET
Basophils Absolute: 0 10*3/uL (ref 0.0–0.1)
Eosinophils Absolute: 0.1 10*3/uL (ref 0.0–0.5)
LYMPH%: 21.1 % (ref 14.0–49.0)
MCV: 104.2 fL — ABNORMAL HIGH (ref 79.3–98.0)
MONO%: 17.6 % — ABNORMAL HIGH (ref 0.0–14.0)
NEUT#: 2.5 10*3/uL (ref 1.5–6.5)
NEUT%: 59.2 % (ref 39.0–75.0)
Platelets: 116 10*3/uL — ABNORMAL LOW (ref 140–400)
RBC: 2.86 10*6/uL — ABNORMAL LOW (ref 4.20–5.82)

## 2012-05-23 LAB — COMPREHENSIVE METABOLIC PANEL (CC13)
Alkaline Phosphatase: 77 U/L (ref 40–150)
BUN: 17.7 mg/dL (ref 7.0–26.0)
Creatinine: 1.1 mg/dL (ref 0.7–1.3)
Glucose: 97 mg/dl (ref 70–99)
Total Bilirubin: 0.4 mg/dL (ref 0.20–1.20)

## 2012-05-23 MED ORDER — PACLITAXEL PROTEIN-BOUND CHEMO INJECTION 100 MG
80.0000 mg/m2 | Freq: Once | INTRAVENOUS | Status: AC
  Administered 2012-05-23: 150 mg via INTRAVENOUS
  Filled 2012-05-23: qty 30

## 2012-05-23 MED ORDER — SODIUM CHLORIDE 0.9 % IV SOLN
Freq: Once | INTRAVENOUS | Status: AC
  Administered 2012-05-23: 12:00:00 via INTRAVENOUS

## 2012-05-23 MED ORDER — DEXAMETHASONE SODIUM PHOSPHATE 4 MG/ML IJ SOLN
20.0000 mg | Freq: Once | INTRAMUSCULAR | Status: AC
  Administered 2012-05-23: 20 mg via INTRAVENOUS

## 2012-05-23 MED ORDER — ONDANSETRON 16 MG/50ML IVPB (CHCC)
16.0000 mg | Freq: Once | INTRAVENOUS | Status: AC
  Administered 2012-05-23: 16 mg via INTRAVENOUS

## 2012-05-23 MED ORDER — SODIUM CHLORIDE 0.9 % IV SOLN
421.5000 mg | Freq: Once | INTRAVENOUS | Status: AC
  Administered 2012-05-23: 420 mg via INTRAVENOUS
  Filled 2012-05-23: qty 42

## 2012-05-23 MED ORDER — SODIUM CHLORIDE 0.9 % IJ SOLN
10.0000 mL | INTRAMUSCULAR | Status: DC | PRN
  Administered 2012-05-23: 10 mL
  Filled 2012-05-23: qty 10

## 2012-05-23 MED ORDER — HEPARIN SOD (PORK) LOCK FLUSH 100 UNIT/ML IV SOLN
500.0000 [IU] | Freq: Once | INTRAVENOUS | Status: AC | PRN
  Administered 2012-05-23: 500 [IU]
  Filled 2012-05-23: qty 5

## 2012-05-23 NOTE — Patient Instructions (Addendum)
[facility] Discharge Instructions for Patients Receiving Chemotherapy  Today you received the following chemotherapy agents abraxane/carbo  To help prevent nausea and vomiting after your treatment, we encourage you to take your nausea medication as directed.   If you develop nausea and vomiting that is not controlled by your nausea medication, call the clinic. If it is after clinic hours your family physician or the after hours number for the clinic or go to the Emergency Department.   BELOW ARE SYMPTOMS THAT SHOULD BE REPORTED IMMEDIATELY:  *FEVER GREATER THAN 100.5 F  *CHILLS WITH OR WITHOUT FEVER  NAUSEA AND VOMITING THAT IS NOT CONTROLLED WITH YOUR NAUSEA MEDICATION  *UNUSUAL SHORTNESS OF BREATH  *UNUSUAL BRUISING OR BLEEDING  TENDERNESS IN MOUTH AND THROAT WITH OR WITHOUT PRESENCE OF ULCERS  *URINARY PROBLEMS  *BOWEL PROBLEMS  UNUSUAL RASH Items with * indicate a potential emergency and should be followed up as soon as possible.  One of the nurses will contact you 24 hours after your treatment. Please let the nurse know about any problems that you may have experienced. Feel free to call the clinic you have any questions or concerns. The clinic phone number is [phone].   I have been informed and understand all the instructions given to me. I know to contact the clinic, my physician, or go to the Emergency Department if any problems should occur. I do not have any questions at this time, but understand that I may call the clinic during office hours   should I have any questions or need assistance in obtaining follow up care.    __________________________________________  _____________  __________ Signature of Patient or Authorized Representative            Date                   Time    __________________________________________ Nurse's Signature

## 2012-05-30 ENCOUNTER — Ambulatory Visit (HOSPITAL_BASED_OUTPATIENT_CLINIC_OR_DEPARTMENT_OTHER): Payer: Medicare Other

## 2012-05-30 ENCOUNTER — Other Ambulatory Visit (HOSPITAL_BASED_OUTPATIENT_CLINIC_OR_DEPARTMENT_OTHER): Payer: Medicare Other | Admitting: Lab

## 2012-05-30 DIAGNOSIS — C7952 Secondary malignant neoplasm of bone marrow: Secondary | ICD-10-CM

## 2012-05-30 DIAGNOSIS — C3410 Malignant neoplasm of upper lobe, unspecified bronchus or lung: Secondary | ICD-10-CM

## 2012-05-30 LAB — CBC WITH DIFFERENTIAL/PLATELET
BASO%: 0.5 % (ref 0.0–2.0)
EOS%: 3.7 % (ref 0.0–7.0)
HCT: 29 % — ABNORMAL LOW (ref 38.4–49.9)
LYMPH%: 26.2 % (ref 14.0–49.0)
MCH: 34.1 pg — ABNORMAL HIGH (ref 27.2–33.4)
MCHC: 32.4 g/dL (ref 32.0–36.0)
NEUT%: 61.2 % (ref 39.0–75.0)
RBC: 2.76 10*6/uL — ABNORMAL LOW (ref 4.20–5.82)
lymph#: 1 10*3/uL (ref 0.9–3.3)

## 2012-05-30 LAB — COMPREHENSIVE METABOLIC PANEL (CC13)
Albumin: 3.3 g/dL — ABNORMAL LOW (ref 3.5–5.0)
Alkaline Phosphatase: 76 U/L (ref 40–150)
Glucose: 108 mg/dl — ABNORMAL HIGH (ref 70–99)
Potassium: 4.1 mEq/L (ref 3.5–5.1)
Sodium: 142 mEq/L (ref 136–145)
Total Protein: 6 g/dL — ABNORMAL LOW (ref 6.4–8.3)

## 2012-05-30 MED ORDER — SODIUM CHLORIDE 0.9 % IJ SOLN
10.0000 mL | INTRAMUSCULAR | Status: DC | PRN
  Administered 2012-05-30: 10 mL
  Filled 2012-05-30: qty 10

## 2012-05-30 MED ORDER — PACLITAXEL PROTEIN-BOUND CHEMO INJECTION 100 MG
80.0000 mg/m2 | Freq: Once | INTRAVENOUS | Status: AC
  Administered 2012-05-30: 150 mg via INTRAVENOUS
  Filled 2012-05-30: qty 30

## 2012-05-30 MED ORDER — HEPARIN SOD (PORK) LOCK FLUSH 100 UNIT/ML IV SOLN
500.0000 [IU] | Freq: Once | INTRAVENOUS | Status: AC | PRN
  Administered 2012-05-30: 500 [IU]
  Filled 2012-05-30: qty 5

## 2012-05-30 MED ORDER — ONDANSETRON 8 MG/50ML IVPB (CHCC)
8.0000 mg | Freq: Once | INTRAVENOUS | Status: AC
  Administered 2012-05-30: 8 mg via INTRAVENOUS

## 2012-05-30 MED ORDER — SODIUM CHLORIDE 0.9 % IV SOLN
Freq: Once | INTRAVENOUS | Status: AC
  Administered 2012-05-30: 10:00:00 via INTRAVENOUS

## 2012-05-30 MED ORDER — DEXAMETHASONE SODIUM PHOSPHATE 10 MG/ML IJ SOLN
10.0000 mg | Freq: Once | INTRAMUSCULAR | Status: AC
  Administered 2012-05-30: 10 mg via INTRAVENOUS

## 2012-05-30 NOTE — Patient Instructions (Addendum)
[facility] Discharge Instructions for Patients Receiving Chemotherapy  Today you received the following chemotherapy agents Abraxane  To help prevent nausea and vomiting after your treatment, we encourage you to take your nausea medication as prescribed.   If you develop nausea and vomiting that is not controlled by your nausea medication, call the clinic. If it is after clinic hours your family physician or the after hours number for the clinic or go to the Emergency Department.   BELOW ARE SYMPTOMS THAT SHOULD BE REPORTED IMMEDIATELY:  *FEVER GREATER THAN 100.5 F  *CHILLS WITH OR WITHOUT FEVER  NAUSEA AND VOMITING THAT IS NOT CONTROLLED WITH YOUR NAUSEA MEDICATION  *UNUSUAL SHORTNESS OF BREATH  *UNUSUAL BRUISING OR BLEEDING  TENDERNESS IN MOUTH AND THROAT WITH OR WITHOUT PRESENCE OF ULCERS  *URINARY PROBLEMS  *BOWEL PROBLEMS  UNUSUAL RASH Items with * indicate a potential emergency and should be followed up as soon as possible.  Feel free to call the clinic you have any questions or concerns. The clinic phone number is [phone].   I have been informed and understand all the instructions given to me. I know to contact the clinic, my physician, or go to the Emergency Department if any problems should occur. I do not have any questions at this time, but understand that I may call the clinic during office hours   should I have any questions or need assistance in obtaining follow up care.    __________________________________________  _____________  __________ Signature of Patient or Authorized Representative            Date                   Time    __________________________________________ Nurse's Signature    

## 2012-06-06 ENCOUNTER — Other Ambulatory Visit (HOSPITAL_BASED_OUTPATIENT_CLINIC_OR_DEPARTMENT_OTHER): Payer: Medicare Other | Admitting: Lab

## 2012-06-06 ENCOUNTER — Encounter: Payer: Self-pay | Admitting: Physician Assistant

## 2012-06-06 ENCOUNTER — Ambulatory Visit (HOSPITAL_BASED_OUTPATIENT_CLINIC_OR_DEPARTMENT_OTHER): Payer: Medicare Other

## 2012-06-06 ENCOUNTER — Ambulatory Visit (HOSPITAL_BASED_OUTPATIENT_CLINIC_OR_DEPARTMENT_OTHER): Payer: Medicare Other | Admitting: Physician Assistant

## 2012-06-06 ENCOUNTER — Telehealth: Payer: Self-pay | Admitting: *Deleted

## 2012-06-06 DIAGNOSIS — C7952 Secondary malignant neoplasm of bone marrow: Secondary | ICD-10-CM

## 2012-06-06 DIAGNOSIS — C7951 Secondary malignant neoplasm of bone: Secondary | ICD-10-CM

## 2012-06-06 DIAGNOSIS — C3410 Malignant neoplasm of upper lobe, unspecified bronchus or lung: Secondary | ICD-10-CM

## 2012-06-06 DIAGNOSIS — Z5111 Encounter for antineoplastic chemotherapy: Secondary | ICD-10-CM

## 2012-06-06 DIAGNOSIS — D72819 Decreased white blood cell count, unspecified: Secondary | ICD-10-CM

## 2012-06-06 DIAGNOSIS — D696 Thrombocytopenia, unspecified: Secondary | ICD-10-CM

## 2012-06-06 LAB — COMPREHENSIVE METABOLIC PANEL (CC13)
ALT: 16 U/L (ref 0–55)
CO2: 25 mEq/L (ref 22–29)
Calcium: 8.5 mg/dL (ref 8.4–10.4)
Chloride: 110 mEq/L — ABNORMAL HIGH (ref 98–107)
Creatinine: 1 mg/dL (ref 0.7–1.3)
Glucose: 98 mg/dl (ref 70–99)
Total Protein: 6 g/dL — ABNORMAL LOW (ref 6.4–8.3)

## 2012-06-06 LAB — CBC WITH DIFFERENTIAL/PLATELET
BASO%: 0.4 % (ref 0.0–2.0)
EOS%: 3.4 % (ref 0.0–7.0)
Eosinophils Absolute: 0.1 10*3/uL (ref 0.0–0.5)
MCH: 34.3 pg — ABNORMAL HIGH (ref 27.2–33.4)
MCV: 104.5 fL — ABNORMAL HIGH (ref 79.3–98.0)
MONO%: 12 % (ref 0.0–14.0)
NEUT#: 1.4 10*3/uL — ABNORMAL LOW (ref 1.5–6.5)
RBC: 2.68 10*6/uL — ABNORMAL LOW (ref 4.20–5.82)
RDW: 16.3 % — ABNORMAL HIGH (ref 11.0–14.6)
nRBC: 2 % — ABNORMAL HIGH (ref 0–0)

## 2012-06-06 MED ORDER — ONDANSETRON 8 MG/50ML IVPB (CHCC)
8.0000 mg | Freq: Once | INTRAVENOUS | Status: AC
  Administered 2012-06-06: 8 mg via INTRAVENOUS

## 2012-06-06 MED ORDER — PACLITAXEL PROTEIN-BOUND CHEMO INJECTION 100 MG
80.0000 mg/m2 | Freq: Once | INTRAVENOUS | Status: AC
  Administered 2012-06-06: 150 mg via INTRAVENOUS
  Filled 2012-06-06: qty 30

## 2012-06-06 MED ORDER — SODIUM CHLORIDE 0.9 % IV SOLN
Freq: Once | INTRAVENOUS | Status: AC
  Administered 2012-06-06: 12:00:00 via INTRAVENOUS

## 2012-06-06 MED ORDER — HEPARIN SOD (PORK) LOCK FLUSH 100 UNIT/ML IV SOLN
500.0000 [IU] | Freq: Once | INTRAVENOUS | Status: AC | PRN
  Administered 2012-06-06: 500 [IU]
  Filled 2012-06-06: qty 5

## 2012-06-06 MED ORDER — SODIUM CHLORIDE 0.9 % IJ SOLN
10.0000 mL | INTRAMUSCULAR | Status: DC | PRN
  Administered 2012-06-06: 10 mL
  Filled 2012-06-06: qty 10

## 2012-06-06 MED ORDER — DEXAMETHASONE SODIUM PHOSPHATE 10 MG/ML IJ SOLN
10.0000 mg | Freq: Once | INTRAMUSCULAR | Status: AC
  Administered 2012-06-06: 10 mg via INTRAVENOUS

## 2012-06-06 NOTE — Patient Instructions (Signed)
[facility] Discharge Instructions for Patients Receiving Chemotherapy  Today you received the following chemotherapy agents: abraxane  To help prevent nausea and vomiting after your treatment, we encourage you to take your nausea medication.  Take it as often as prescribed.     If you develop nausea and vomiting that is not controlled by your nausea medication, call the clinic. If it is after clinic hours your family physician or the after hours number for the clinic or go to the Emergency Department.   BELOW ARE SYMPTOMS THAT SHOULD BE REPORTED IMMEDIATELY:  *FEVER GREATER THAN 100.5 F  *CHILLS WITH OR WITHOUT FEVER  NAUSEA AND VOMITING THAT IS NOT CONTROLLED WITH YOUR NAUSEA MEDICATION  *UNUSUAL SHORTNESS OF BREATH  *UNUSUAL BRUISING OR BLEEDING  TENDERNESS IN MOUTH AND THROAT WITH OR WITHOUT PRESENCE OF ULCERS  *URINARY PROBLEMS  *BOWEL PROBLEMS  UNUSUAL RASH Items with * indicate a potential emergency and should be followed up as soon as possible.  Feel free to call the clinic you have any questions or concerns. The clinic phone number is [phone].   I have been informed and understand all the instructions given to me. I know to contact the clinic, my physician, or go to the Emergency Department if any problems should occur. I do not have any questions at this time, but understand that I may call the clinic during office hours   should I have any questions or need assistance in obtaining follow up care.    __________________________________________  _____________  __________ Signature of Patient or Authorized Representative            Date                   Time    __________________________________________ Nurse's Signature   

## 2012-06-06 NOTE — Progress Notes (Signed)
Ok to treat despite counts per Tiana Loft

## 2012-06-06 NOTE — Patient Instructions (Addendum)
Continue with labs and chemotherapy as scheduled Followup in 2 weeks for another symptom management visit 

## 2012-06-06 NOTE — Progress Notes (Signed)
[facility] [phone]   [phone]  OFFICE PROGRESS NOTE  [name], MD [address] [facility] [place], [place]. 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   PRIOR THERAPY: Status post palliative radiotherapy to the right hip and sacrum under the care of Dr. Basilio Cairo.   CURRENT THERAPY:  systemic chemotherapy with carboplatin for AUC of 6 on day 1 and Abraxane 100 mg/M2 on days 1, 8 and 15 every 3 weeks. The patient is status post 4 cycles. As well as day 1 and day 8 of cycle 5  INTERVAL HISTORY: Joel Gibson 79 y.o. male returns to the clinic today for followup visit accompanied by his wife. He states that his heel pain is significantly better. Both he and his wife report that he had a nosebleed off and from the right nares Saturday with a "bloodshot right eye". His eye Dr., Dr. Blima Ledger is aware of this phenomena and had assured him that was not interfering with his eyesight. He been using Visine with some resolution of his symptoms. He also further explained that his nosebleed was more of a trickle. Both issues can occur when he forcefully blows his nose. The nosebleed resolved on its own with the help of some nasal saline spray.  He continues to be  concerned about his history of bone metastasis in his pelvic region, status post radiation therapy, however he denies any pain at all. He has in fact not been taking any pain medication at all. He voices no other specific complaints except for mild fatigue. He tolerated the previous 5 cycles of his systemic chemotherapy with carboplatin and Abraxane fairly well except for intermittent pancytopenia which is currently resolved but platelets counts is still low. He denied having any significant weight loss or night sweats. He denied having any nausea or vomiting, no fever or  chills.  MEDICAL HISTORY: Past Medical History  Diagnosis Date  . Hyperlipidemia   . Hypertension   . Aortic stenosis     mild to moderate  . Gastritis   . COPD (chronic obstructive pulmonary disease)   . Bronchitis   . Hepatitis     possible  . Hx of cataract surgery     lt eye  . Tobacco abuse   . Lung cancer 02/02/2012  . Shortness of breath     with activity  . Retinal vein occlusion 1990  . GERD (gastroesophageal reflux disease)   . Hemorrhoid   . Aortic stenosis   . Hypertension   . Dyslipidemia   . Glaucoma   . Right rib fracture      Pathological Fracture / 4 weeks ago  . Lung mass     Right Upper Lobe - Lobular Mass  . Bone metastases     Widespread/ Lytic Lesion Left 7t Rib, Left Iliac Pelvis, ribs and scapula  . SOB (shortness of breath)     Mild  . Cough   . Stroke 2002    Right slight difference in vision  . S/P radiation therapy 02/22/12 - 03/07/12    Pelvis/Sacrum/30 Gray/10 Fractions and Left Scapula/Rib/ 8 Gray/1 Fraction   . On antineoplastic chemotherapy      Carboplatin/ Abraxane    ALLERGIES:  has No Known Allergies.  MEDICATIONS:  Current Outpatient Prescriptions  Medication Sig Dispense Refill  . albuterol (PROVENTIL HFA;VENTOLIN HFA) 108 (90 BASE)  MCG/ACT inhaler Inhale 2 puffs into the lungs every 4 (four) hours as needed.      Marland Kitchen amLODipine (NORVASC) 10 MG tablet Take 10 mg by mouth daily. Per Dr. Caryn Section patient to hold this medication unless/until BP is > 150/110      . aspirin EC 81 MG tablet Take 81 mg by mouth every other day. Takes along with Aggrenox      . Aspirin-Dipyridamole (AGGRENOX PO) Take 25-200 mg by mouth 2 (two) times daily. Takes 2 tabs one day alt. with 1 tab next day,etc.      . betaxolol (BETOPTIC-S) 0.25 % ophthalmic suspension Place 1 drop into both eyes daily. One drop in the left eye in the am....one drop in the right eye in the am and in the pm.      . irbesartan (AVAPRO) 300 MG tablet Take 300 mg by mouth daily.        Marland Kitchen lidocaine-prilocaine (EMLA) cream Apply 1 application topically as needed.      . Multiple Vitamin (MULTIVITAMIN PO) Take 1 tablet by mouth daily.        . niacin (NIASPAN) 500 MG CR tablet Take 500 mg by mouth at bedtime.       Marland Kitchen omeprazole (PRILOSEC) 20 MG capsule Take 20 mg by mouth daily. Per Dr. Virgel Manifold      . prochlorperazine (COMPAZINE) 10 MG tablet Take 10 mg by mouth every 6 (six) hours as needed. For nausea      . rosuvastatin (CRESTOR) 40 MG tablet Take 20 mg by mouth daily.       Marland Kitchen senna (SENOKOT) 8.6 MG TABS Take 1 tablet by mouth daily as needed.       . tiotropium (SPIRIVA) 18 MCG inhalation capsule Place 18 mcg into inhaler and inhale daily.         No current facility-administered medications for this visit.   Facility-Administered Medications Ordered in Other Visits  Medication Dose Route Frequency Provider Last Rate Last Dose  . sodium chloride 0.9 % injection 10 mL  10 mL Intracatheter PRN Si Gaul, MD   10 mL at 06/06/12 1435    SURGICAL HISTORY:  Past Surgical History  Procedure Laterality Date  . Vasectomy    . Tonsillectomy    . Leg surgery      rt for nerve impingement  . Collarbone fracture      1977  . Tonsillectomy    . Eye surgery      catarct bil  . Portacath placement  02/08/2012    Procedure: INSERTION PORT-A-CATH;  Surgeon: [name], MD;  Location: [facility] OR;  Service: Thoracic;  Laterality: Right;  . Ct guided core biopsy  01/26/12    Left Lytic Lesion - Metastatic Squamous Cell Carcinoma  . 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   PHYSICAL EXAMINATION: General appearance: alert, cooperative and no distress Head: Normocephalic, without obvious abnormality, atraumatic Neck: no adenopathy Lymph nodes: Cervical, supraclavicular, and axillary nodes normal. Resp: clear to auscultation bilaterally Cardio: regular rate  and rhythm, S1, S2 normal, no murmur, click, rub or gallop GI: soft, non-tender; bowel sounds normal; no masses,  no organomegaly Extremities: extremities normal, atraumatic, no cyanosis or edema Neurologic: Alert and oriented X 3, normal strength and tone. Normal symmetric reflexes. Normal coordination and gait There is a right  medial subconjunctival time we'll hemorrhage  ECOG PERFORMANCE STATUS: 1 - Symptomatic but completely ambulatory  Blood pressure 140/65, pulse 90, temperature 97.4 F (36.3 C), temperature source Oral, resp. rate 18, height 5\' 7"  (1.702 m), weight 155 lb 12.8 oz (70.67 kg).  LABORATORY DATA: Lab Results  Component Value Date   WBC 2.7* 06/06/2012   HGB 9.2* 06/06/2012   HCT 28.0* 06/06/2012   MCV 104.5* 06/06/2012   PLT 93* 06/06/2012      Chemistry      Component Value Date/Time   NA 143 06/06/2012 1004   NA 137 03/10/2012 1804   K 4.0 06/06/2012 1004   K 3.7 03/10/2012 1804   CL 110* 06/06/2012 1004   CL 103 03/10/2012 1804   CO2 25 06/06/2012 1004   CO2 26 03/10/2012 1804   BUN 15.2 06/06/2012 1004   BUN 25* 03/10/2012 1804   CREATININE 1.0 06/06/2012 1004   CREATININE 1.04 03/10/2012 1804      Component Value Date/Time   CALCIUM 8.5 06/06/2012 1004   CALCIUM 7.9* 03/10/2012 1804   ALKPHOS 74 06/06/2012 1004   ALKPHOS 67 03/10/2012 1804   AST 18 06/06/2012 1004   AST 11 03/10/2012 1804   ALT 16 06/06/2012 1004   ALT 15 03/10/2012 1804   BILITOT 0.30 06/06/2012 1004   BILITOT 0.3 03/10/2012 1804       RADIOGRAPHIC STUDIES: Dg Femur Right  03/28/2012  *RADIOLOGY REPORT*  Clinical Data: Right hip and femur pain.  RIGHT FEMUR - 2 VIEW  Comparison: None.  Findings: Right femur is intact.  Right hip joint is unremarkable.  IMPRESSION: Negative.   Original Report Authenticated By: Leanna Battles, M.D.    Ct Chest W Contrast  04/15/2012  *RADIOLOGY REPORT*  Clinical Data: Lung cancer diagnosed in August 2012.  Restaging post completion of chemotherapy and radiation therapy.  CT  CHEST WITH CONTRAST  Technique:  Multidetector CT imaging of the chest was performed following the standard protocol during bolus administration of intravenous contrast.  Contrast: 80mL OMNIPAQUE IOHEXOL 300 MG/ML  SOLN  Comparison: PET CT 01/20/2012.  Chest CT 01/05/2012.  Findings: There are no enlarged mediastinal or hilar lymph nodes. Small mediastinal lymph nodes are unchanged.  Right subclavian Port- A-Cath tip is in the lower SVC.  There is moderate atherosclerosis of the aorta, great vessels and coronary arteries.  The previously demonstrated right apical mass is smaller and less dense.  This measures 2.6 x 2.2 cm on image 14 (previously 3.3 x 2.2 cm).  No new or enlarging pulmonary nodules are identified. Moderate emphysematous changes are stable.  The visualized upper abdomen has a stable appearance with small low- density renal lesions bilaterally.  There is no adrenal mass.  The previously demonstrated lytic lesion involving the left seventh rib laterally is no longer seen.  There is a healing fracture in this area.  There are healing fractures of the right seventh and eighth ribs laterally. The lytic lesion involving the inferior tip of the left scapula is unchanged.  No new osseous lesions are identified.  IMPRESSION:  1.  Interval decreased size and density of the right upper lobe mass consistent with response to therapy. 2.  No evidence of nodal metastasis. 3.  Previously demonstrated metastases to the left seventh rib and scapula are stable to improved.  No new osseous metastases identified.   Original Report Authenticated By: Carey Bullocks, M.D.     ASSESSMENT/PLAN: This is a very pleasant 79 years old white male  with metastatic non-small cell lung cancer, squamous cell carcinoma currently on systemic chemotherapy with carboplatin and Abraxane status post 4 cycles with significant improvement in his disease. The patient was discussed with Dr. Arbutus Ped. His platelet count is 93,000 and his ANC is  1.4. These values were reviewed with Dr. Arbutus Ped who has opted for the patient to proceed with day 15 of cycle 5. The patient will return in 2 weeks for another symptom management visit with a repeat CBC differential and C. met. He is encouraged to gently blows his nose so as not to cause further subconjuctival hemorrhage. Patient voiced understanding. He will be due to be scheduled for restaging CT scans when he returns in 2 weeks for a symptom management visit.    Kadin Canipe E, PA-C   He was advised to call immediately if he has any concerning symptoms in the interval  All questions were answered. The patient knows to call the clinic with any problems, questions or concerns. We can certainly see the patient much sooner if necessary.  I spent 20 minutes counseling the patient face to face. The total time spent in the appointment was 30 minutes.

## 2012-06-06 NOTE — Telephone Encounter (Signed)
Per staff phone call and POF I have schedueld appts.  JMW  

## 2012-06-07 ENCOUNTER — Telehealth: Payer: Self-pay | Admitting: Internal Medicine

## 2012-06-13 ENCOUNTER — Ambulatory Visit: Payer: Medicare Other

## 2012-06-13 ENCOUNTER — Other Ambulatory Visit: Payer: Medicare Other | Admitting: Lab

## 2012-06-13 ENCOUNTER — Other Ambulatory Visit (HOSPITAL_BASED_OUTPATIENT_CLINIC_OR_DEPARTMENT_OTHER): Payer: Medicare Other

## 2012-06-13 DIAGNOSIS — C3410 Malignant neoplasm of upper lobe, unspecified bronchus or lung: Secondary | ICD-10-CM

## 2012-06-13 LAB — CBC WITH DIFFERENTIAL/PLATELET
BASO%: 1.5 % (ref 0.0–2.0)
EOS%: 1.5 % (ref 0.0–7.0)
Eosinophils Absolute: 0 10*3/uL (ref 0.0–0.5)
LYMPH%: 35.4 % (ref 14.0–49.0)
MCH: 35 pg — ABNORMAL HIGH (ref 27.2–33.4)
MCHC: 34 g/dL (ref 32.0–36.0)
MCV: 103 fL — ABNORMAL HIGH (ref 79.3–98.0)
MONO%: 12.1 % (ref 0.0–14.0)
NEUT#: 1 10*3/uL — ABNORMAL LOW (ref 1.5–6.5)
Platelets: 70 10*3/uL — ABNORMAL LOW (ref 140–400)
RBC: 2.34 10*6/uL — ABNORMAL LOW (ref 4.20–5.82)
RDW: 15.6 % — ABNORMAL HIGH (ref 11.0–14.6)
nRBC: 3 % — ABNORMAL HIGH (ref 0–0)

## 2012-06-13 NOTE — Progress Notes (Signed)
Joel Gibson, PAC reviewed pt's CBC and gave order to hold chemotherapy today.  Return next week for next scheduled appt on 4/14.   Pt notified by Charge RN,  Amy.

## 2012-06-20 ENCOUNTER — Telehealth: Payer: Self-pay | Admitting: Internal Medicine

## 2012-06-20 ENCOUNTER — Ambulatory Visit: Payer: Medicare Other

## 2012-06-20 ENCOUNTER — Ambulatory Visit (HOSPITAL_BASED_OUTPATIENT_CLINIC_OR_DEPARTMENT_OTHER): Payer: Medicare Other | Admitting: Physician Assistant

## 2012-06-20 ENCOUNTER — Other Ambulatory Visit (HOSPITAL_BASED_OUTPATIENT_CLINIC_OR_DEPARTMENT_OTHER): Payer: Medicare Other | Admitting: Lab

## 2012-06-20 DIAGNOSIS — C3410 Malignant neoplasm of upper lobe, unspecified bronchus or lung: Secondary | ICD-10-CM

## 2012-06-20 DIAGNOSIS — C7951 Secondary malignant neoplasm of bone: Secondary | ICD-10-CM

## 2012-06-20 DIAGNOSIS — D696 Thrombocytopenia, unspecified: Secondary | ICD-10-CM

## 2012-06-20 LAB — CBC WITH DIFFERENTIAL/PLATELET
BASO%: 0.3 % (ref 0.0–2.0)
EOS%: 1.6 % (ref 0.0–7.0)
HCT: 26.7 % — ABNORMAL LOW (ref 38.4–49.9)
MCH: 34.7 pg — ABNORMAL HIGH (ref 27.2–33.4)
MCHC: 32.2 g/dL (ref 32.0–36.0)
MONO#: 0.8 10*3/uL (ref 0.1–0.9)
NEUT%: 53.2 % (ref 39.0–75.0)
RBC: 2.48 10*6/uL — ABNORMAL LOW (ref 4.20–5.82)
WBC: 3.1 10*3/uL — ABNORMAL LOW (ref 4.0–10.3)
lymph#: 0.6 10*3/uL — ABNORMAL LOW (ref 0.9–3.3)
nRBC: 1 % — ABNORMAL HIGH (ref 0–0)

## 2012-06-20 NOTE — Patient Instructions (Addendum)
Your platelet count is too low to proceed with chemotherapy Follow up with Dr. Arbutus Ped in 2 weeks with restaging Ct scan of your chest, abdomen and pelvis to re-evaluate your disease

## 2012-06-20 NOTE — Telephone Encounter (Signed)
gv and printed appt schedule for pt for April and May....pt aware cs will contact with d/t for ct....gv pt barium

## 2012-06-23 NOTE — Progress Notes (Signed)
[facility] Telephone:([phone]) [phone]   Fax:([phone]  OFFICE PROGRESS NOTE  [name], MD [address] [facility], [place]. [place] [address]  DIAGNOSIS: Stage IV non-small cell lung cancer, squamous cell carcinoma with significant bone metastases diagnosed in November of 2013.   PRIOR THERAPY: Status post palliative radiotherapy to the right hip and sacrum under the care of Dr. Basilio Cairo.   CURRENT THERAPY:  systemic chemotherapy with carboplatin for AUC of 6 on day 1 and Abraxane 100 mg/M2 on days 1, 8 and 15 every 3 weeks. The patient is status post 5 cycles.  INTERVAL HISTORY: Joel Gibson 79 y.o. male returns to the clinic today for followup visit accompanied by his wife. He reports in general he feels good except for some slight unsteady he feeling in his hips. He reports the plate most of the round of 18 holes of golf. He did rise in the golf cart. He did have some shortness of breath with exertion but overall enjoyed being able to get out and play. He presents to proceed with cycle #6 of his systemic chemotherapy with carboplatin and Abraxane. This cycle is been postponed twice due to low platelet count.  He denied having any significant weight loss or night sweats. He denied having any nausea or vomiting, no fever or chills.  MEDICAL HISTORY: Past Medical History  Diagnosis Date  . Hyperlipidemia   . Hypertension   . Aortic stenosis     mild to moderate  . Gastritis   . COPD (chronic obstructive pulmonary disease)   . Bronchitis   . Hepatitis     possible  . Hx of cataract surgery     lt eye  . Tobacco abuse   . Lung cancer 02/02/2012  . Shortness of breath     with activity  . Retinal vein occlusion 1990  . GERD (gastroesophageal reflux disease)   . Hemorrhoid   . Aortic stenosis   . Hypertension   . Dyslipidemia   . Glaucoma   . Right rib fracture      Pathological Fracture / 4 weeks ago  . Lung mass      Right Upper Lobe - Lobular Mass  . Bone metastases     Widespread/ Lytic Lesion Left 7t Rib, Left Iliac Pelvis, ribs and scapula  . SOB (shortness of breath)     Mild  . Cough   . Stroke 2002    Right slight difference in vision  . S/P radiation therapy 02/22/12 - 03/07/12    Pelvis/Sacrum/30 Gray/10 Fractions and Left Scapula/Rib/ 8 Gray/1 Fraction   . On antineoplastic chemotherapy      Carboplatin/ Abraxane    ALLERGIES:  has No Known Allergies.  MEDICATIONS:  Current Outpatient Prescriptions  Medication Sig Dispense Refill  . albuterol (PROVENTIL HFA;VENTOLIN HFA) 108 (90 BASE) MCG/ACT inhaler Inhale 2 puffs into the lungs every 4 (four) hours as needed.      Marland Kitchen aspirin EC 81 MG tablet Take 81 mg by mouth every other day. Takes along with Aggrenox      . Aspirin-Dipyridamole (AGGRENOX PO) Take 25-200 mg by mouth 2 (two) times daily. Takes 2 tabs one day alt. with 1 tab next day,etc.      . betaxolol (BETOPTIC-S) 0.25 % ophthalmic suspension Place 1 drop into both eyes daily. One drop in the left eye in the am....one drop in the right eye in the am and in the pm.      .  irbesartan (AVAPRO) 300 MG tablet Take 300 mg by mouth daily.       Marland Kitchen lidocaine-prilocaine (EMLA) cream Apply 1 application topically as needed.      . Multiple Vitamin (MULTIVITAMIN PO) Take 1 tablet by mouth daily.        . niacin (NIASPAN) 500 MG CR tablet Take 500 mg by mouth at bedtime.       Marland Kitchen omeprazole (PRILOSEC) 20 MG capsule Take 20 mg by mouth daily. Per Dr. Virgel Manifold      . prochlorperazine (COMPAZINE) 10 MG tablet Take 10 mg by mouth every 6 (six) hours as needed. For nausea      . rosuvastatin (CRESTOR) 40 MG tablet Take 20 mg by mouth daily.       Marland Kitchen senna (SENOKOT) 8.6 MG TABS Take 1 tablet by mouth daily as needed.       . tiotropium (SPIRIVA) 18 MCG inhalation capsule Place 18 mcg into inhaler and inhale daily.         No current facility-administered medications for this visit.    SURGICAL HISTORY:   Past Surgical History  Procedure Laterality Date  . Vasectomy    . Tonsillectomy    . Leg surgery      rt for nerve impingement  . Collarbone fracture      1977  . Tonsillectomy    . Eye surgery      catarct bil  . Portacath placement  02/08/2012    Procedure: INSERTION PORT-A-CATH;  Surgeon: [name], MD;  Location: [facility] OR;  Service: Thoracic;  Laterality: Right;  . Ct guided core biopsy  01/26/12    Left Lytic Lesion - Metastatic Squamous Cell Carcinoma  . 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   PHYSICAL EXAMINATION: General appearance: alert, cooperative and no distress Head: Normocephalic, without obvious abnormality, atraumatic Neck: no adenopathy Lymph nodes: Cervical, supraclavicular, and axillary nodes normal. Resp: clear to auscultation bilaterally Cardio: regular rate and rhythm, S1, S2 normal, no murmur, click, rub or gallop GI: soft, non-tender; bowel sounds normal; no masses,  no organomegaly Extremities: extremities normal, atraumatic, no cyanosis or edema Neurologic: Alert and oriented X 3, normal strength and tone. Normal symmetric reflexes. Normal coordination and gait   ECOG PERFORMANCE STATUS: 1 - Symptomatic but completely ambulatory  Blood pressure 151/73, pulse 67, temperature 96.5 F (35.8 C), temperature source Oral, resp. rate 18, height 5\' 7"  (1.702 m), weight 161 lb (73.029 kg).  LABORATORY DATA: Lab Results  Component Value Date   WBC 3.1* 06/20/2012   HGB 8.6* 06/20/2012   HCT 26.7* 06/20/2012   MCV 107.7* 06/20/2012   PLT 67* 06/20/2012      Chemistry      Component Value Date/Time   NA 143 06/06/2012 1004   NA 137 03/10/2012 1804   K 4.0 06/06/2012 1004   K 3.7 03/10/2012 1804   CL 110* 06/06/2012 1004   CL 103 03/10/2012 1804   CO2 25 06/06/2012 1004   CO2 26 03/10/2012 1804   BUN 15.2 06/06/2012 1004   BUN 25* 03/10/2012  1804   CREATININE 1.0 06/06/2012 1004   CREATININE 1.04 03/10/2012 1804      Component Value Date/Time   CALCIUM 8.5 06/06/2012 1004   CALCIUM 7.9* 03/10/2012 1804   ALKPHOS 74 06/06/2012 1004   ALKPHOS 67  03/10/2012 1804   AST 18 06/06/2012 1004   AST 11 03/10/2012 1804   ALT 16 06/06/2012 1004   ALT 15 03/10/2012 1804   BILITOT 0.30 06/06/2012 1004   BILITOT 0.3 03/10/2012 1804       RADIOGRAPHIC STUDIES: Dg Femur Right  03/28/2012  *RADIOLOGY REPORT*  Clinical Data: Right hip and femur pain.  RIGHT FEMUR - 2 VIEW  Comparison: None.  Findings: Right femur is intact.  Right hip joint is unremarkable.  IMPRESSION: Negative.   Original Report Authenticated By: Leanna Battles, M.D.    Ct Chest W Contrast  04/15/2012  *RADIOLOGY REPORT*  Clinical Data: Lung cancer diagnosed in August 2012.  Restaging post completion of chemotherapy and radiation therapy.  CT CHEST WITH CONTRAST  Technique:  Multidetector CT imaging of the chest was performed following the standard protocol during bolus administration of intravenous contrast.  Contrast: 80mL OMNIPAQUE IOHEXOL 300 MG/ML  SOLN  Comparison: PET CT 01/20/2012.  Chest CT 01/05/2012.  Findings: There are no enlarged mediastinal or hilar lymph nodes. Small mediastinal lymph nodes are unchanged.  Right subclavian Port- A-Cath tip is in the lower SVC.  There is moderate atherosclerosis of the aorta, great vessels and coronary arteries.  The previously demonstrated right apical mass is smaller and less dense.  This measures 2.6 x 2.2 cm on image 14 (previously 3.3 x 2.2 cm).  No new or enlarging pulmonary nodules are identified. Moderate emphysematous changes are stable.  The visualized upper abdomen has a stable appearance with small low- density renal lesions bilaterally.  There is no adrenal mass.  The previously demonstrated lytic lesion involving the left seventh rib laterally is no longer seen.  There is a healing fracture in this area.  There are healing fractures of  the right seventh and eighth ribs laterally. The lytic lesion involving the inferior tip of the left scapula is unchanged.  No new osseous lesions are identified.  IMPRESSION:  1.  Interval decreased size and density of the right upper lobe mass consistent with response to therapy. 2.  No evidence of nodal metastasis. 3.  Previously demonstrated metastases to the left seventh rib and scapula are stable to improved.  No new osseous metastases identified.   Original Report Authenticated By: Carey Bullocks, M.D.     ASSESSMENT/PLAN: This is a very pleasant 79 years old white male with metastatic non-small cell lung cancer, squamous cell carcinoma currently on systemic chemotherapy with carboplatin and Abraxane status post 4 cycles with significant improvement in his disease. The patient was discussed with Dr. Arbutus Ped. His platelet count is 67,000. The low platelet count was reviewed with Dr. Arbutus Ped. We will skip cycle 6, as this will have been the third time it would have had to of been postpone and proceed with a restaging CT scan. He will followup with Dr. Arbutus Ped in 2 weeks with restaging CT of the chest, abdomen and pelvis  With contrast to reevaluate his disease.    Fernanda Twaddell E, PA-C   He was advised to call immediately if he has any concerning symptoms in the interval  All questions were answered. The patient knows to call the clinic with any problems, questions or concerns. We can certainly see the patient much sooner if necessary.  I spent 20 minutes counseling the patient face to face. The total time spent in the appointment was 30 minutes.

## 2012-06-27 ENCOUNTER — Other Ambulatory Visit (HOSPITAL_BASED_OUTPATIENT_CLINIC_OR_DEPARTMENT_OTHER): Payer: Medicare Other

## 2012-06-27 DIAGNOSIS — C3410 Malignant neoplasm of upper lobe, unspecified bronchus or lung: Secondary | ICD-10-CM

## 2012-06-27 LAB — COMPREHENSIVE METABOLIC PANEL (CC13)
Alkaline Phosphatase: 90 U/L (ref 40–150)
BUN: 22 mg/dL (ref 7.0–26.0)
Creatinine: 1.2 mg/dL (ref 0.7–1.3)
Glucose: 117 mg/dl — ABNORMAL HIGH (ref 70–99)
Sodium: 145 mEq/L (ref 136–145)
Total Bilirubin: 0.29 mg/dL (ref 0.20–1.20)
Total Protein: 6.3 g/dL — ABNORMAL LOW (ref 6.4–8.3)

## 2012-06-27 LAB — CBC WITH DIFFERENTIAL/PLATELET
BASO%: 0.2 % (ref 0.0–2.0)
Basophils Absolute: 0 10*3/uL (ref 0.0–0.1)
HGB: 9.6 g/dL — ABNORMAL LOW (ref 13.0–17.1)
LYMPH%: 15.8 % (ref 14.0–49.0)
MCHC: 33.7 g/dL (ref 32.0–36.0)
MONO#: 0.8 10*3/uL (ref 0.1–0.9)
NEUT#: 2.9 10*3/uL (ref 1.5–6.5)
NEUT%: 65.7 % (ref 39.0–75.0)

## 2012-06-28 ENCOUNTER — Telehealth: Payer: Self-pay | Admitting: Internal Medicine

## 2012-06-28 NOTE — Telephone Encounter (Signed)
returned pt call and advised that per AJ the lab is going to be b4 est appt not b4 the ct.Marland KitchenMarland KitchenMarland Kitchen

## 2012-07-04 ENCOUNTER — Other Ambulatory Visit: Payer: Medicare Other | Admitting: Lab

## 2012-07-04 ENCOUNTER — Ambulatory Visit (HOSPITAL_COMMUNITY)
Admission: RE | Admit: 2012-07-04 | Discharge: 2012-07-04 | Disposition: A | Discharge status: Home / Self Care | Payer: Medicare Other | Source: Ambulatory Visit | Attending: Physician Assistant | Admitting: Physician Assistant

## 2012-07-04 ENCOUNTER — Encounter (HOSPITAL_COMMUNITY): Payer: Self-pay

## 2012-07-04 DIAGNOSIS — C7951 Secondary malignant neoplasm of bone: Secondary | ICD-10-CM | POA: Insufficient documentation

## 2012-07-04 DIAGNOSIS — C3490 Malignant neoplasm of unspecified part of unspecified bronchus or lung: Secondary | ICD-10-CM | POA: Insufficient documentation

## 2012-07-04 DIAGNOSIS — R599 Enlarged lymph nodes, unspecified: Secondary | ICD-10-CM | POA: Insufficient documentation

## 2012-07-04 MED ORDER — IOHEXOL 300 MG/ML  SOLN
100.0000 mL | Freq: Once | INTRAMUSCULAR | Status: AC | PRN
  Administered 2012-07-04: 100 mL via INTRAVENOUS

## 2012-07-07 ENCOUNTER — Encounter: Payer: Self-pay | Admitting: Internal Medicine

## 2012-07-07 ENCOUNTER — Other Ambulatory Visit (HOSPITAL_BASED_OUTPATIENT_CLINIC_OR_DEPARTMENT_OTHER): Payer: Medicare Other | Admitting: Lab

## 2012-07-07 ENCOUNTER — Ambulatory Visit (HOSPITAL_BASED_OUTPATIENT_CLINIC_OR_DEPARTMENT_OTHER): Payer: Medicare Other | Admitting: Internal Medicine

## 2012-07-07 DIAGNOSIS — C3490 Malignant neoplasm of unspecified part of unspecified bronchus or lung: Secondary | ICD-10-CM

## 2012-07-07 LAB — COMPREHENSIVE METABOLIC PANEL (CC13)
Albumin: 3.3 g/dL — ABNORMAL LOW (ref 3.5–5.0)
Alkaline Phosphatase: 97 U/L (ref 40–150)
BUN: 20.8 mg/dL (ref 7.0–26.0)
CO2: 24 mEq/L (ref 22–29)
Glucose: 103 mg/dl — ABNORMAL HIGH (ref 70–99)
Total Bilirubin: 0.34 mg/dL (ref 0.20–1.20)

## 2012-07-07 LAB — CBC WITH DIFFERENTIAL/PLATELET
Basophils Absolute: 0 10*3/uL (ref 0.0–0.1)
Eosinophils Absolute: 0.2 10*3/uL (ref 0.0–0.5)
HCT: 31.3 % — ABNORMAL LOW (ref 38.4–49.9)
HGB: 10.4 g/dL — ABNORMAL LOW (ref 13.0–17.1)
LYMPH%: 13.7 % — ABNORMAL LOW (ref 14.0–49.0)
MCV: 108 fL — ABNORMAL HIGH (ref 79.3–98.0)
MONO%: 15.4 % — ABNORMAL HIGH (ref 0.0–14.0)
NEUT#: 3.3 10*3/uL (ref 1.5–6.5)
NEUT%: 67.3 % (ref 39.0–75.0)
Platelets: 153 10*3/uL (ref 140–400)

## 2012-07-07 NOTE — Patient Instructions (Signed)
The scan showed evidence for disease progression. We discussed treatment options with single agent gemcitabine, last cycle scheduled for May 20,2014. Followup visit at that time.

## 2012-07-07 NOTE — Progress Notes (Signed)
[facility] [phone]   [phone]  OFFICE PROGRESS NOTE  [name], MD [address] [facility], [place]. 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   PRIOR THERAPY: Status post palliative radiotherapy to the right hip and sacrum under the care of Dr. Basilio Cairo.   CURRENT THERAPY:  systemic chemotherapy with carboplatin for AUC of 6 on day 1 and Abraxane 100 mg/M2 on days 1, 8 and 15 every 3 weeks. The patient is status post 5 cycles.  INTERVAL HISTORY: Joel Gibson 79 y.o. male returns to the clinic today for followup visit accompanied by his wife. The patient is feeling fine today with no specific complaints except for pain in the pelvic area especially when he tries to lean forwards. He also continues to have residual peripheral neuropathy especially in the lower extremities. He denied having any significant weight loss or night sweats. He denied having any chest pain except for occasional left sided rib cage pain, no shortness of breath, no cough or hemoptysis. He tolerated the course of systemic chemotherapy with carboplatin and Abraxane fairly well except for missing treatment secondary to neutropenia and thrombocytopenia. He had repeat CT scan of the chest, abdomen and pelvis performed recently and he is here for evaluation and discussion of his scan results.  MEDICAL HISTORY: Past Medical History  Diagnosis Date  . Hyperlipidemia   . Hypertension   . Aortic stenosis     mild to moderate  . Gastritis   . COPD (chronic obstructive pulmonary disease)   . Bronchitis   . Hepatitis     possible  . Hx of cataract surgery     lt eye  . Tobacco abuse   . Lung cancer 02/02/2012  . Shortness of breath     with activity  . Retinal vein occlusion 1990  . GERD (gastroesophageal reflux disease)   .  Hemorrhoid   . Aortic stenosis   . Hypertension   . Dyslipidemia   . Glaucoma   . Right rib fracture      Pathological Fracture / 4 weeks ago  . Lung mass     Right Upper Lobe - Lobular Mass  . Bone metastases     Widespread/ Lytic Lesion Left 7t Rib, Left Iliac Pelvis, ribs and scapula  . SOB (shortness of breath)     Mild  . Cough   . Stroke 2002    Right slight difference in vision  . S/P radiation therapy 02/22/12 - 03/07/12    Pelvis/Sacrum/30 Gray/10 Fractions and Left Scapula/Rib/ 8 Gray/1 Fraction   . On antineoplastic chemotherapy      Carboplatin/ Abraxane    ALLERGIES:  has No Known Allergies.  MEDICATIONS:  Current Outpatient Prescriptions  Medication Sig Dispense Refill  . aspirin EC 81 MG tablet Take 81 mg by mouth every other day. Takes along with Aggrenox      . Aspirin-Dipyridamole (AGGRENOX PO) Take 25-200 mg by mouth 2 (two) times daily. Takes 2 tabs one day alt. with 1 tab next day,etc.      . betaxolol (BETOPTIC-S) 0.25 % ophthalmic suspension Place 1 drop into both eyes daily. One drop in the left eye in the am....one drop in the right eye in the am and in the pm.      . irbesartan (AVAPRO) 300 MG tablet Take 300 mg by mouth daily.       Marland Kitchen  lidocaine-prilocaine (EMLA) cream Apply 1 application topically as needed.      . Multiple Vitamin (MULTIVITAMIN PO) Take 1 tablet by mouth daily.        . niacin (NIASPAN) 500 MG CR tablet Take 500 mg by mouth at bedtime.       Marland Kitchen omeprazole (PRILOSEC) 20 MG capsule Take 20 mg by mouth daily. Per Dr. Virgel Manifold      . rosuvastatin (CRESTOR) 40 MG tablet Take 20 mg by mouth daily.       Marland Kitchen senna (SENOKOT) 8.6 MG TABS Take 1 tablet by mouth daily as needed.       . tiotropium (SPIRIVA) 18 MCG inhalation capsule Place 18 mcg into inhaler and inhale daily.        Marland Kitchen albuterol (PROVENTIL HFA;VENTOLIN HFA) 108 (90 BASE) MCG/ACT inhaler Inhale 2 puffs into the lungs every 4 (four) hours as needed.      . prochlorperazine (COMPAZINE) 10  MG tablet Take 10 mg by mouth every 6 (six) hours as needed. For nausea       No current facility-administered medications for this visit.    SURGICAL HISTORY:  Past Surgical History  Procedure Laterality Date  . Vasectomy    . Tonsillectomy    . Leg surgery      rt for nerve impingement  . Collarbone fracture      1977  . Tonsillectomy    . Eye surgery      catarct bil  . Portacath placement  02/08/2012    Procedure: INSERTION PORT-A-CATH;  Surgeon: [name], MD;  Location: [facility] OR;  Service: Thoracic;  Laterality: Right;  . Ct guided core biopsy  01/26/12    Left Lytic Lesion - Metastatic Squamous Cell Carcinoma  . Skin biopsy  02/09/12    Right Upper Back, shave : Melanoma In Situ Arising  From a Dysplastic Nevus    REVIEW OF SYSTEMS:  A comprehensive review of systems was negative except for: Respiratory: positive for pleurisy/chest pain Musculoskeletal: positive for arthralgias, bone pain and muscle weakness Neurological: positive for paresthesia   PHYSICAL EXAMINATION: General appearance: alert, cooperative and no distress Head: Normocephalic, without obvious abnormality, atraumatic Neck: no adenopathy Lymph nodes: Cervical, supraclavicular, and axillary nodes normal. Resp: clear to auscultation bilaterally Cardio: regular rate and rhythm, S1, S2 normal, no murmur, click, rub or gallop GI: soft, non-tender; bowel sounds normal; no masses,  no organomegaly Extremities: extremities normal, atraumatic, no cyanosis or edema Neurologic: Alert and oriented X 3, normal strength and tone. Normal symmetric reflexes. Normal coordination and gait  ECOG PERFORMANCE STATUS: 1 - Symptomatic but completely ambulatory  Blood pressure 148/56, pulse 88, temperature 97 F (36.1 C), temperature source Oral, resp. rate 20, height 5\' 7"  (1.702 m), weight 160 lb 11.2 oz (72.893 kg).  LABORATORY DATA: Lab Results  Component Value Date   WBC 5.0 07/07/2012   HGB 10.4* 07/07/2012   HCT  31.3* 07/07/2012   MCV 108.0* 07/07/2012   PLT 153 07/07/2012      Chemistry      Component Value Date/Time   NA 144 07/07/2012 0909   NA 137 03/10/2012 1804   K 3.6 07/07/2012 0909   K 3.7 03/10/2012 1804   CL 110* 07/07/2012 0909   CL 103 03/10/2012 1804   CO2 24 07/07/2012 0909   CO2 26 03/10/2012 1804   BUN 20.8 07/07/2012 0909   BUN 25* 03/10/2012 1804   CREATININE 1.1 07/07/2012 0909   CREATININE 1.04 03/10/2012  1804      Component Value Date/Time   CALCIUM 8.7 07/07/2012 0909   CALCIUM 7.9* 03/10/2012 1804   ALKPHOS 97 07/07/2012 0909   ALKPHOS 67 03/10/2012 1804   AST 20 07/07/2012 0909   AST 11 03/10/2012 1804   ALT 16 07/07/2012 0909   ALT 15 03/10/2012 1804   BILITOT 0.34 07/07/2012 0909   BILITOT 0.3 03/10/2012 1804       RADIOGRAPHIC STUDIES: Ct Chest W Contrast  07/04/2012  *RADIOLOGY REPORT*  Clinical Data:  Restaging squamous cell carcinoma.  Lung cancer bone metastasis.  CT CHEST, ABDOMEN AND PELVIS WITH CONTRAST  Technique:  Multidetector CT imaging of the chest, abdomen and pelvis was performed following the standard protocol during bolus administration of intravenous contrast.  Contrast: OMNIPAQUE IOHEXOL 300 MG/ML  SOLN  Comparison:   None.   CT CHEST  Findings:  In the right upper lobe,  nodule has increased in size and density compared to prior measuring 30 x 30 mm (image 15) compared to 26 x 22 mm on prior.  The most significant increase in size is noted in the coronal projection with the lesion measures 32 mm compared to approximately 21 mm on prior.  No additional pulmonary nodules are present.  Interval enlargement of left axillary lymph node measuring 11 mm (image 16) increased from 6 mm on prior.  No supraclavicular lymphadenopathy.  No mediastinal lymphadenopathy.  No pericardial fluid.  Esophagus is normal.  IMPRESSION: 1.  Interval recurrence of lung cancer with enlarged right upper lobe pulmonary nodule which now qualifies as a  pulmonary mass. 2.  Interval enlargement of left axillary  lymph node is most concerning for axillary nodal metastasis.   CT ABDOMEN AND PELVIS  Findings:  No focal hepatic lesion.  Gallbladder, pancreas, spleen, adrenal glands, kidneys are unchanged.  There are multiple mixed density cysts within left right kidney which are unchanged from comparison exam.  There is small nodule in the lateral aspect of the right pararenal space measuring 6 mm (image 59) which is unchanged from prior.  The stomach, small bowel, colon normal.  Abdominal aorta is normal caliber.  No retroperitoneal lymphadenopathy.  Small retrocrural nodes are similar to prior.  Again demonstrated are lytic lesions within the pelvis.    For example 3.1 cm lesion in the posterior right iliac bone is slightly increased from 2.7 cm on prior.  There is a large lesion in the right sacrum measures 5.5 cm compared to 4.3 on prior.  Lesion at L3 anteriorly measures 18 mm increased from the 14 mm on prior. Lesion within the right scapula right lateral rib are noted.  The right rib lesion is slightly increased.  No new lesions are identified.  IMPRESSION:  1. No evidence metastatic disease of soft tissues of the abdomen or pelvis. 2.  Small retroperitoneal nodule along the left pararenal space is unchanged. This was not hypermetabolic on comparison PET CT scan.  3.  Lytic lesions within the pelvis and spine and ribs are mildly increased in size compared to prior.  No new lesions are identified.   Original Report Authenticated By: Genevive Bi, M.D.    ASSESSMENT: This is a very pleasant 79 years old white male with history of metastatic non-small cell lung cancer, squamous cell carcinoma status post palliative radiotherapy to the right hip and sacrum followed by 5 cycles of systemic chemotherapy with carboplatin and Abraxane with initial response and now he has evidence for disease progression on his recent  scan.   PLAN: I have a lengthy discussion with the patient and his wife today about his condition. I  recommended for him consideration of second line chemotherapy in the form of single agent gemcitabine 1000 mg/M2 on days 1 and 8 every 3 weeks. I discussed with the patient adverse effect of this treatment including but not limited to alopecia, myelosuppression, nausea and vomiting, liver or renal dysfunction.  The patient also benefit from treatment with Rivka Barbara for his bone disease but he will need dental clearance first. He'll contact his dentist in the next few days for evaluation before consideration of treatment with Xgeva. I would consider the patient to start the first cycle of his systemic chemotherapy on 07/26/2012 to give him more time to recover from the dental procedure. I also consider referring him back to Dr. Basilio Cairo for consideration of palliative radiotherapy to the painful bone lesions and he would like to hold on this for now. He would come back for followup visit in 3 weeks with the start of the first cycle of the chemotherapy. All questions were answered. The patient knows to call the clinic with any problems, questions or concerns. We can certainly see the patient much sooner if necessary.  I spent 15 minutes counseling the patient face to face. The total time spent in the appointment was 25 minutes.

## 2012-07-11 ENCOUNTER — Other Ambulatory Visit: Payer: Self-pay | Admitting: *Deleted

## 2012-07-11 NOTE — Progress Notes (Signed)
Pt's wife called stating that they prefer appts to be on Mondays. Per Dr Donnald Garre, due to memorial day holiday pt will keep cycle 1 on Tuesday 5/20 and 5/27, but can move all future appts to Mondays.  Onc tx schedule filled out.  SLJ

## 2012-07-25 ENCOUNTER — Telehealth: Payer: Self-pay | Admitting: *Deleted

## 2012-07-25 NOTE — Telephone Encounter (Signed)
Pt's wife called stating that they are interested in radiation to the bone lesions.  Pt is requesting to see Dr Basilio Cairo before seeing Dr Donnald Garre tomorrow, 5/20.  Per dr Donnald Garre, okay to call Dr. Karoline Caldwell office to see if he can be seen tomorrow morning.  Called and left a msg with Dr Colletta Maryland RN, Elnita Maxwell. SLJ

## 2012-07-26 ENCOUNTER — Ambulatory Visit (HOSPITAL_BASED_OUTPATIENT_CLINIC_OR_DEPARTMENT_OTHER): Payer: Medicare Other

## 2012-07-26 ENCOUNTER — Encounter: Payer: Self-pay | Admitting: Internal Medicine

## 2012-07-26 ENCOUNTER — Ambulatory Visit (HOSPITAL_BASED_OUTPATIENT_CLINIC_OR_DEPARTMENT_OTHER): Payer: Medicare Other | Admitting: Internal Medicine

## 2012-07-26 ENCOUNTER — Other Ambulatory Visit (HOSPITAL_BASED_OUTPATIENT_CLINIC_OR_DEPARTMENT_OTHER): Payer: Medicare Other

## 2012-07-26 ENCOUNTER — Telehealth: Payer: Self-pay | Admitting: Internal Medicine

## 2012-07-26 VITALS — BP 156/75 | HR 86 | Temp 97.0°F | Resp 18 | Ht 67.0 in | Wt 157.9 lb

## 2012-07-26 DIAGNOSIS — Z5111 Encounter for antineoplastic chemotherapy: Secondary | ICD-10-CM

## 2012-07-26 DIAGNOSIS — C3410 Malignant neoplasm of upper lobe, unspecified bronchus or lung: Secondary | ICD-10-CM

## 2012-07-26 DIAGNOSIS — C7951 Secondary malignant neoplasm of bone: Secondary | ICD-10-CM

## 2012-07-26 DIAGNOSIS — C7952 Secondary malignant neoplasm of bone marrow: Secondary | ICD-10-CM

## 2012-07-26 DIAGNOSIS — C801 Malignant (primary) neoplasm, unspecified: Secondary | ICD-10-CM

## 2012-07-26 LAB — CBC WITH DIFFERENTIAL/PLATELET
Basophils Absolute: 0 10*3/uL (ref 0.0–0.1)
EOS%: 3.7 % (ref 0.0–7.0)
Eosinophils Absolute: 0.3 10*3/uL (ref 0.0–0.5)
HCT: 35 % — ABNORMAL LOW (ref 38.4–49.9)
HGB: 11.3 g/dL — ABNORMAL LOW (ref 13.0–17.1)
MCH: 33.7 pg — ABNORMAL HIGH (ref 27.2–33.4)
MCV: 104.5 fL — ABNORMAL HIGH (ref 79.3–98.0)
MONO%: 12.7 % (ref 0.0–14.0)
NEUT%: 70.1 % (ref 39.0–75.0)

## 2012-07-26 LAB — COMPREHENSIVE METABOLIC PANEL (CC13)
Albumin: 3.3 g/dL — ABNORMAL LOW (ref 3.5–5.0)
Alkaline Phosphatase: 96 U/L (ref 40–150)
BUN: 18.3 mg/dL (ref 7.0–26.0)
CO2: 25 mEq/L (ref 22–29)
Glucose: 93 mg/dl (ref 70–99)
Potassium: 4 mEq/L (ref 3.5–5.1)
Total Bilirubin: 0.26 mg/dL (ref 0.20–1.20)

## 2012-07-26 MED ORDER — PROCHLORPERAZINE MALEATE 10 MG PO TABS
10.0000 mg | ORAL_TABLET | Freq: Once | ORAL | Status: AC
  Administered 2012-07-26: 10 mg via ORAL

## 2012-07-26 MED ORDER — SODIUM CHLORIDE 0.9 % IJ SOLN
10.0000 mL | INTRAMUSCULAR | Status: DC | PRN
  Administered 2012-07-26: 10 mL
  Filled 2012-07-26: qty 10

## 2012-07-26 MED ORDER — SODIUM CHLORIDE 0.9 % IV SOLN
Freq: Once | INTRAVENOUS | Status: AC
  Administered 2012-07-26: 13:00:00 via INTRAVENOUS

## 2012-07-26 MED ORDER — HEPARIN SOD (PORK) LOCK FLUSH 100 UNIT/ML IV SOLN
500.0000 [IU] | Freq: Once | INTRAVENOUS | Status: AC | PRN
  Administered 2012-07-26: 500 [IU]
  Filled 2012-07-26: qty 5

## 2012-07-26 MED ORDER — SODIUM CHLORIDE 0.9 % IV SOLN
1000.0000 mg/m2 | Freq: Once | INTRAVENOUS | Status: AC
  Administered 2012-07-26: 1862 mg via INTRAVENOUS
  Filled 2012-07-26: qty 49

## 2012-07-26 NOTE — Telephone Encounter (Signed)
s.w. pt and advised to pick up updated sched at nxt visit....pt ok and aware °

## 2012-07-26 NOTE — Progress Notes (Signed)
[facility] Telephone:([phone]   [phone]  OFFICE PROGRESS NOTE  [name], MD [address] [facility], [place]. 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   PRIOR THERAPY:  1) Status post palliative radiotherapy to the right hip and sacrum under the care of Dr. Basilio Cairo.  2) Systemic chemotherapy with carboplatin for AUC of 6 on day 1 and Abraxane 100 mg/M2 on days 1, 8 and 15 every 3 weeks. The patient is status post 5 cycles.  CURRENT THERAPY:  1) Gemcitabine 1000 mg/M2 on days 1 and 8 every 3 weeks. First cycle expected today 07/26/2012. 2) Xgeva 120 mg subcutaneously every 4 weeks. First dose expected on 08/02/2012.  INTERVAL HISTORY: Joel Gibson 79 y.o. male returns to the clinic today for followup visit accompanied his wife. The patient is feeling fine today with no specific complaints except for mild pain at the left rib cage area in addition to the hips bilaterally. He denied having any significant weight loss or night sweats. He denied having any chest pain but continues to have shortness breath with exertion. He denied having any cough or hemoptysis. The patient was seen by his dentist recently and was cleared to proceed with the treatment with Xgeva.   MEDICAL HISTORY: Past Medical History  Diagnosis Date  . Hyperlipidemia   . Hypertension   . Aortic stenosis     mild to moderate  . Gastritis   . COPD (chronic obstructive pulmonary disease)   . Bronchitis   . Hepatitis     possible  . Hx of cataract surgery     lt eye  . Tobacco abuse   . Lung cancer 02/02/2012  . Shortness of breath     with activity  . Retinal vein occlusion 1990  . GERD (gastroesophageal reflux disease)   . Hemorrhoid   . Aortic stenosis   . Hypertension   . Dyslipidemia   . Glaucoma   . Right rib fracture    Pathological Fracture / 4 weeks ago  . Lung mass     Right Upper Lobe - Lobular Mass  . Bone metastases     Widespread/ Lytic Lesion Left 7t Rib, Left Iliac Pelvis, ribs and scapula  . SOB (shortness of breath)     Mild  . Cough   . Stroke 2002    Right slight difference in vision  . S/P radiation therapy 02/22/12 - 03/07/12    Pelvis/Sacrum/30 Gray/10 Fractions and Left Scapula/Rib/ 8 Gray/1 Fraction   . On antineoplastic chemotherapy      Carboplatin/ Abraxane    ALLERGIES:  has No Known Allergies.  MEDICATIONS:  Current Outpatient Prescriptions  Medication Sig Dispense Refill  . albuterol (PROVENTIL HFA;VENTOLIN HFA) 108 (90 BASE) MCG/ACT inhaler Inhale 2 puffs into the lungs every 4 (four) hours as needed.      Marland Kitchen aspirin EC 81 MG tablet Take 81 mg by mouth every other day. Takes along with Aggrenox      . Aspirin-Dipyridamole (AGGRENOX PO) Take 25-200 mg by mouth 2 (two) times daily. Takes 2 tabs one day alt. with 1 tab next day,etc.      . betaxolol (BETOPTIC-S) 0.25 % ophthalmic suspension Place 1 drop into both eyes daily. One drop in the left eye in the am....one drop in the right eye in the am and in the pm.      .  irbesartan (AVAPRO) 300 MG tablet Take 300 mg by mouth daily.       Marland Kitchen lidocaine-prilocaine (EMLA) cream Apply 1 application topically as needed.      . Multiple Vitamin (MULTIVITAMIN PO) Take 1 tablet by mouth daily.        . niacin (NIASPAN) 500 MG CR tablet Take 500 mg by mouth at bedtime.       . Nutritional Supplements (EQUATE PO) Take by mouth. Daily      . omeprazole (PRILOSEC) 20 MG capsule Take 20 mg by mouth daily. Per Dr. Virgel Manifold      . prochlorperazine (COMPAZINE) 10 MG tablet Take 10 mg by mouth every 6 (six) hours as needed. For nausea      . rosuvastatin (CRESTOR) 40 MG tablet Take 20 mg by mouth daily.       Marland Kitchen tiotropium (SPIRIVA) 18 MCG inhalation capsule Place 18 mcg into inhaler and inhale daily.         No current facility-administered medications  for this visit.    SURGICAL HISTORY:  Past Surgical History  Procedure Laterality Date  . Vasectomy    . Tonsillectomy    . Leg surgery      rt for nerve impingement  . Collarbone fracture      1977  . Tonsillectomy    . Eye surgery      catarct bil  . Portacath placement  02/08/2012    Procedure: INSERTION PORT-A-CATH;  Surgeon: [name], MD;  Location: [facility] OR;  Service: Thoracic;  Laterality: Right;  . Ct guided core biopsy  01/26/12    Left Lytic Lesion - Metastatic Squamous Cell Carcinoma  . Skin biopsy  02/09/12    Right Upper Back, shave : Melanoma In Situ Arising  From a Dysplastic Nevus    REVIEW OF SYSTEMS:  A comprehensive review of systems was negative except for: Musculoskeletal: positive for bone pain   PHYSICAL EXAMINATION: General appearance: alert, cooperative and no distress Head: Normocephalic, without obvious abnormality, atraumatic Neck: no adenopathy Lymph nodes: Cervical, supraclavicular, and axillary nodes normal. Resp: clear to auscultation bilaterally Cardio: regular rate and rhythm, S1, S2 normal, no murmur, click, rub or gallop GI: soft, non-tender; bowel sounds normal; no masses,  no organomegaly Extremities: extremities normal, atraumatic, no cyanosis or edema Neurologic: Alert and oriented X 3, normal strength and tone. Normal symmetric reflexes. Normal coordination and gait  ECOG PERFORMANCE STATUS: 1 - Symptomatic but completely ambulatory  Blood pressure 156/75, pulse 86, temperature 97 F (36.1 C), temperature source Oral, resp. rate 18, height 5\' 7"  (1.702 m), weight 157 lb 14.4 oz (71.623 kg).  LABORATORY DATA: Lab Results  Component Value Date   WBC 7.0 07/26/2012   HGB 11.3* 07/26/2012   HCT 35.0* 07/26/2012   MCV 104.5* 07/26/2012   PLT 206 07/26/2012      Chemistry      Component Value Date/Time   NA 144 07/07/2012 0909   NA 137 03/10/2012 1804   K 3.6 07/07/2012 0909   K 3.7 03/10/2012 1804   CL 110* 07/07/2012 0909   CL 103  03/10/2012 1804   CO2 24 07/07/2012 0909   CO2 26 03/10/2012 1804   BUN 20.8 07/07/2012 0909   BUN 25* 03/10/2012 1804   CREATININE 1.1 07/07/2012 0909   CREATININE 1.04 03/10/2012 1804      Component Value Date/Time   CALCIUM 8.7 07/07/2012 0909   CALCIUM 7.9* 03/10/2012 1804   ALKPHOS 97 07/07/2012 0909  ALKPHOS 67 03/10/2012 1804   AST 20 07/07/2012 0909   AST 11 03/10/2012 1804   ALT 16 07/07/2012 0909   ALT 15 03/10/2012 1804   BILITOT 0.34 07/07/2012 0909   BILITOT 0.3 03/10/2012 1804       RADIOGRAPHIC STUDIES: Ct Chest W Contrast  07/04/2012   *RADIOLOGY REPORT*  Clinical Data:  Restaging squamous cell carcinoma.  Lung cancer bone metastasis.  CT CHEST, ABDOMEN AND PELVIS WITH CONTRAST  Technique:  Multidetector CT imaging of the chest, abdomen and pelvis was performed following the standard protocol during bolus administration of intravenous contrast.  Contrast: OMNIPAQUE IOHEXOL 300 MG/ML  SOLN  Comparison:   None.  CT CHEST  Findings:  In the right upper lobe,  nodule has increased in size and density compared to prior measuring 30 x 30 mm (image 15) compared to 26 x 22 mm on prior.  The most significant increase in size is noted in the coronal projection with the lesion measures 32 mm compared to approximately 21 mm on prior.  No additional pulmonary nodules are present.  Interval enlargement of left axillary lymph node measuring 11 mm (image 16) increased from 6 mm on prior.  No supraclavicular lymphadenopathy.  No mediastinal lymphadenopathy.  No pericardial fluid.  Esophagus is normal.  IMPRESSION: 1.  Interval recurrence of lung cancer with enlarged right upper lobe pulmonary nodule which now qualifies as a  pulmonary mass. 2.  Interval enlargement of left axillary lymph node is most concerning for axillary nodal metastasis.  CT ABDOMEN AND PELVIS  Findings:  No focal hepatic lesion.  Gallbladder, pancreas, spleen, adrenal glands, kidneys are unchanged.  There are multiple mixed density cysts within  left right kidney which are unchanged from comparison exam.  There is small nodule in the lateral aspect of the right pararenal space measuring 6 mm (image 59) which is unchanged from prior.  The stomach, small bowel, colon normal.  Abdominal aorta is normal caliber.  No retroperitoneal lymphadenopathy.  Small retrocrural nodes are similar to prior.  Again demonstrated are lytic lesions within the pelvis.    For example 3.1 cm lesion in the posterior right iliac bone is slightly increased from 2.7 cm on prior.  There is a large lesion in the right sacrum measures 5.5 cm compared to 4.3 on prior.  Lesion at L3 anteriorly measures 18 mm increased from the 14 mm on prior. Lesion within the right scapula right lateral rib are noted.  The right rib lesion is slightly increased.  No new lesions are identified.  IMPRESSION:  1. No evidence metastatic disease of soft tissues of the abdomen or pelvis. 2.  Small retroperitoneal nodule along the left pararenal space is unchanged. This was not hypermetabolic on comparison PET CT scan.  3.  Lytic lesions within the pelvis and spine and ribs are mildly increased in size compared to prior.  No new lesions are identified.   Original Report Authenticated By: Genevive Bi, M.D.   Ct Abdomen Pelvis W Contrast  07/04/2012   *RADIOLOGY REPORT*  Clinical Data:  Restaging squamous cell carcinoma.  Lung cancer bone metastasis.  CT CHEST, ABDOMEN AND PELVIS WITH CONTRAST  Technique:  Multidetector CT imaging of the chest, abdomen and pelvis was performed following the standard protocol during bolus administration of intravenous contrast.  Contrast: OMNIPAQUE IOHEXOL 300 MG/ML  SOLN  Comparison:   None.  CT CHEST  Findings:  In the right upper lobe,  nodule has increased in size and  density compared to prior measuring 30 x 30 mm (image 15) compared to 26 x 22 mm on prior.  The most significant increase in size is noted in the coronal projection with the lesion measures 32 mm  compared to approximately 21 mm on prior.  No additional pulmonary nodules are present.  Interval enlargement of left axillary lymph node measuring 11 mm (image 16) increased from 6 mm on prior.  No supraclavicular lymphadenopathy.  No mediastinal lymphadenopathy.  No pericardial fluid.  Esophagus is normal.  IMPRESSION: 1.  Interval recurrence of lung cancer with enlarged right upper lobe pulmonary nodule which now qualifies as a  pulmonary mass. 2.  Interval enlargement of left axillary lymph node is most concerning for axillary nodal metastasis.  CT ABDOMEN AND PELVIS  Findings:  No focal hepatic lesion.  Gallbladder, pancreas, spleen, adrenal glands, kidneys are unchanged.  There are multiple mixed density cysts within left right kidney which are unchanged from comparison exam.  There is small nodule in the lateral aspect of the right pararenal space measuring 6 mm (image 59) which is unchanged from prior.  The stomach, small bowel, colon normal.  Abdominal aorta is normal caliber.  No retroperitoneal lymphadenopathy.  Small retrocrural nodes are similar to prior.  Again demonstrated are lytic lesions within the pelvis.    For example 3.1 cm lesion in the posterior right iliac bone is slightly increased from 2.7 cm on prior.  There is a large lesion in the right sacrum measures 5.5 cm compared to 4.3 on prior.  Lesion at L3 anteriorly measures 18 mm increased from the 14 mm on prior. Lesion within the right scapula right lateral rib are noted.  The right rib lesion is slightly increased.  No new lesions are identified.  IMPRESSION:  1. No evidence metastatic disease of soft tissues of the abdomen or pelvis. 2.  Small retroperitoneal nodule along the left pararenal space is unchanged. This was not hypermetabolic on comparison PET CT scan.  3.  Lytic lesions within the pelvis and spine and ribs are mildly increased in size compared to prior.  No new lesions are identified.   Original Report Authenticated By:  Genevive Bi, M.D.    ASSESSMENT: This is a very pleasant 79 years old white male with metastatic non-small cell lung cancer with recent evidence for disease progression. The patient is here today to start second line chemotherapy with single agent gemcitabine.   PLAN: I recommended for the patient to proceed with treatment as planned. For bone metastasis, I would consider the patient for treatment with Xgeva 120 mg subcutaneously every 4 weeks, first dose scheduled next week. I referred the patient to Dr. Basilio Cairo for consideration of palliative radiotherapy to the painful bony lesions. The patient would come back for followup visit in 3 weeks with the next cycle of his chemotherapy. He was advised to call immediately if he has any concerning symptoms in the interval.  All questions were answered. The patient knows to call the clinic with any problems, questions or concerns. We can certainly see the patient much sooner if necessary.

## 2012-07-26 NOTE — Patient Instructions (Signed)
Continue chemotherapy today as scheduled. Xgeva for bone disease next week. Followup visit in 3 weeks

## 2012-07-26 NOTE — Patient Instructions (Addendum)

## 2012-07-27 ENCOUNTER — Telehealth: Payer: Self-pay | Admitting: *Deleted

## 2012-07-27 NOTE — Telephone Encounter (Signed)
Message copied by Augusto Garbe on Wed Jul 27, 2012  5:31 PM ------      Message from: Elby Beck      Created: Tue Jul 26, 2012 12:54 PM      Regarding: chemo follow-up call       Contact: [phone]       [phone] gemzar ------

## 2012-07-27 NOTE — Telephone Encounter (Signed)
Message copied by Augusto Garbe on Wed Jul 27, 2012  4:12 PM ------      Message from: [place], [place] P      Created: Tue Jul 26, 2012  2:38 PM      Regarding: chemo follow-up call      Contact: [phone]       This may be a duplicate.....Marland Kitchen1st Gemzar.  Dr. Arbutus Ped ------

## 2012-07-27 NOTE — Telephone Encounter (Signed)
Patient's wife Joel Gibson answered phone and offered to help.  Answered call back assessment questions sating he is doing well.  Denies n/v and no problems with bowels and bladder.  Reports he had to take a morphine last night and no need for pain medicine today.  Encouraged to ensure he drinks 64 oz water daily and for him to call tomorrow if he has any questions.

## 2012-07-27 NOTE — Telephone Encounter (Signed)
Mr. Bebout says he is feeling [place].  So far everything is going well and usually after treatment he feels kind of down.  "I've had no complications and no after effects."  Reports he will call if anything changes and denies questions at this time.

## 2012-07-28 ENCOUNTER — Telehealth: Payer: Self-pay | Admitting: *Deleted

## 2012-07-28 NOTE — Telephone Encounter (Signed)
Received letter from Dr Isac Caddy office giving pt dental clearance, letter given to Dr Donnald Garre to review, will be scanned into [facility]

## 2012-08-02 ENCOUNTER — Ambulatory Visit (HOSPITAL_BASED_OUTPATIENT_CLINIC_OR_DEPARTMENT_OTHER): Payer: Medicare Other

## 2012-08-02 ENCOUNTER — Other Ambulatory Visit: Payer: Self-pay | Admitting: Radiation Oncology

## 2012-08-02 ENCOUNTER — Ambulatory Visit: Payer: Medicare Other

## 2012-08-02 ENCOUNTER — Encounter: Payer: Self-pay | Admitting: Radiation Oncology

## 2012-08-02 ENCOUNTER — Ambulatory Visit
Admission: RE | Admit: 2012-08-02 | Discharge: 2012-08-02 | Disposition: A | Discharge status: Home / Self Care | Payer: Medicare Other | Source: Ambulatory Visit | Attending: Radiation Oncology | Admitting: Radiation Oncology

## 2012-08-02 ENCOUNTER — Other Ambulatory Visit (HOSPITAL_BASED_OUTPATIENT_CLINIC_OR_DEPARTMENT_OTHER): Payer: Medicare Other

## 2012-08-02 ENCOUNTER — Telehealth: Payer: Self-pay | Admitting: Medical Oncology

## 2012-08-02 VITALS — BP 158/74 | HR 81 | Temp 97.8°F

## 2012-08-02 VITALS — BP 169/83 | HR 75 | Temp 97.6°F | Ht 67.0 in | Wt 158.7 lb

## 2012-08-02 DIAGNOSIS — Z8673 Personal history of transient ischemic attack (TIA), and cerebral infarction without residual deficits: Secondary | ICD-10-CM | POA: Insufficient documentation

## 2012-08-02 DIAGNOSIS — C7951 Secondary malignant neoplasm of bone: Secondary | ICD-10-CM

## 2012-08-02 DIAGNOSIS — K5900 Constipation, unspecified: Secondary | ICD-10-CM | POA: Insufficient documentation

## 2012-08-02 DIAGNOSIS — Z87891 Personal history of nicotine dependence: Secondary | ICD-10-CM | POA: Insufficient documentation

## 2012-08-02 DIAGNOSIS — K219 Gastro-esophageal reflux disease without esophagitis: Secondary | ICD-10-CM | POA: Insufficient documentation

## 2012-08-02 DIAGNOSIS — H409 Unspecified glaucoma: Secondary | ICD-10-CM | POA: Insufficient documentation

## 2012-08-02 DIAGNOSIS — C3490 Malignant neoplasm of unspecified part of unspecified bronchus or lung: Secondary | ICD-10-CM | POA: Insufficient documentation

## 2012-08-02 DIAGNOSIS — C7952 Secondary malignant neoplasm of bone marrow: Secondary | ICD-10-CM | POA: Insufficient documentation

## 2012-08-02 DIAGNOSIS — C3410 Malignant neoplasm of upper lobe, unspecified bronchus or lung: Secondary | ICD-10-CM

## 2012-08-02 DIAGNOSIS — J4489 Other specified chronic obstructive pulmonary disease: Secondary | ICD-10-CM | POA: Insufficient documentation

## 2012-08-02 DIAGNOSIS — Z7982 Long term (current) use of aspirin: Secondary | ICD-10-CM | POA: Insufficient documentation

## 2012-08-02 DIAGNOSIS — Z85118 Personal history of other malignant neoplasm of bronchus and lung: Secondary | ICD-10-CM | POA: Insufficient documentation

## 2012-08-02 DIAGNOSIS — Z923 Personal history of irradiation: Secondary | ICD-10-CM | POA: Insufficient documentation

## 2012-08-02 DIAGNOSIS — I10 Essential (primary) hypertension: Secondary | ICD-10-CM | POA: Insufficient documentation

## 2012-08-02 DIAGNOSIS — M25559 Pain in unspecified hip: Secondary | ICD-10-CM | POA: Insufficient documentation

## 2012-08-02 DIAGNOSIS — E785 Hyperlipidemia, unspecified: Secondary | ICD-10-CM | POA: Insufficient documentation

## 2012-08-02 DIAGNOSIS — J449 Chronic obstructive pulmonary disease, unspecified: Secondary | ICD-10-CM | POA: Insufficient documentation

## 2012-08-02 DIAGNOSIS — Z510 Encounter for antineoplastic radiation therapy: Secondary | ICD-10-CM | POA: Insufficient documentation

## 2012-08-02 DIAGNOSIS — Z79899 Other long term (current) drug therapy: Secondary | ICD-10-CM | POA: Insufficient documentation

## 2012-08-02 LAB — CBC WITH DIFFERENTIAL/PLATELET
Basophils Absolute: 0 10*3/uL (ref 0.0–0.1)
HCT: 31.3 % — ABNORMAL LOW (ref 38.4–49.9)
HGB: 10.3 g/dL — ABNORMAL LOW (ref 13.0–17.1)
LYMPH%: 41.1 % (ref 14.0–49.0)
MCH: 33.2 pg (ref 27.2–33.4)
MONO#: 0.3 10*3/uL (ref 0.1–0.9)
NEUT%: 43.6 % (ref 39.0–75.0)
Platelets: 93 10*3/uL — ABNORMAL LOW (ref 140–400)
WBC: 2.1 10*3/uL — ABNORMAL LOW (ref 4.0–10.3)
lymph#: 0.9 10*3/uL (ref 0.9–3.3)

## 2012-08-02 MED ORDER — DENOSUMAB 120 MG/1.7ML ~~LOC~~ SOLN
120.0000 mg | Freq: Once | SUBCUTANEOUS | Status: AC
  Administered 2012-08-02: 120 mg via SUBCUTANEOUS
  Filled 2012-08-02: qty 1.7

## 2012-08-02 MED ORDER — DENOSUMAB 120 MG/1.7ML ~~LOC~~ SOLN: 120.0000 mg | Freq: Once | SUBCUTANEOUS | Status: DC

## 2012-08-02 NOTE — Progress Notes (Signed)
Radiation Oncology         [phone]) [phone] ________________________________  Outpatient Re-Consultation  Name: Joel Gibson MRN: [id number]  Date: 08/02/2012  DOB: 1931-10-29  EX:BMWU,[id number] R, MD  Si Gaul, MD   REFERRING PHYSICIAN: Si Gaul, MD  Diagnosis: Bone metastases, metastatic squamous cell carcinoma, consistent with lung primary    Prior Radiation treatment dates: 02/22/2012-03/07/2012  Site/dose: 1) pelvis and sacrum/30 Gray in 10 fractions  2) left scapula and rib/ 8 Gray in 1 fraction      HISTORY OF PRESENT ILLNESS::Joel Gibson is a 79 y.o. male who completed RT 5 months ago for bone metastases with excellent palliative result.  However, he has recently developed pain in similar areas where he received treatment.  CT scan on 07/04/12 showed Lytic lesions within the pelvis and spine and ribs are mildly increased in size compared to prior.  He has no lumbar pain, but does have sacral pain and pain corresponding to the left acetabular and right ilium lesions.  He has recurrent pain in the lateral left rib cage where he was treated, but not in the scapula. He is ambulatory. Pain on a scale of 0-10 is: pain level -Ribs level 6 and bilateral hips - 8.  No other new complaints.  Systemic therapy history is as below: PRIOR THERAPY:  1) Status post palliative radiotherapy to the right hip and sacrum under the care of Dr. Basilio Cairo.  2) Systemic chemotherapy with carboplatin for AUC of 6 on day 1 and Abraxane 100 mg/M2 on days 1, 8 and 15 every 3 weeks. The patient is status post 5 cycles.  CURRENT THERAPY:  1) Gemcitabine 1000 mg/M2 on days 1 and 8 every 3 weeks. First cycle expected today 07/26/2012.  2) Xgeva 120 mg subcutaneously every 4 weeks. First dose expected on 08/02/2012.   PREVIOUS RADIATION THERAPY: Yes as above  PAST MEDICAL HISTORY:  has a past medical history of Hyperlipidemia; Hypertension; Aortic stenosis; Gastritis; COPD (chronic obstructive  pulmonary disease); Bronchitis; Hepatitis; cataract surgery; Tobacco abuse; Lung cancer (02/02/2012); Shortness of breath; Retinal vein occlusion (1990); GERD (gastroesophageal reflux disease); Hemorrhoid; Aortic stenosis; Hypertension; Dyslipidemia; Glaucoma; Right rib fracture; Lung mass; Bone metastases; SOB (shortness of breath); Cough; Stroke (2002); S/P radiation therapy (02/22/12 - 03/07/12); and On antineoplastic chemotherapy.    PAST SURGICAL HISTORY: Past Surgical History  Procedure Laterality Date  . Vasectomy    . Tonsillectomy    . Leg surgery      rt for nerve impingement  . Collarbone fracture      1977  . Tonsillectomy    . Eye surgery      catarct bil  . Portacath placement  02/08/2012    Procedure: INSERTION PORT-A-CATH;  Surgeon: [name], MD;  Location: [facility] OR;  Service: Thoracic;  Laterality: Right;  . Ct guided core biopsy  01/26/12    Left Lytic Lesion - Metastatic Squamous Cell Carcinoma  . Skin biopsy  02/09/12    Right Upper Back, shave : Melanoma In Situ Arising  From a Dysplastic Nevus    FAMILY HISTORY: family history includes Heart attack in his fathers; Leukemia in his mother; and Throat cancer in his father.  SOCIAL HISTORY:  reports that he quit smoking about 6 months ago. His smoking use included Cigarettes. He has a 130 pack-year smoking history. He has never used smokeless tobacco. He reports that  drinks alcohol. He reports that he does not use illicit drugs.  ALLERGIES: Review of patient's allergies  indicates no known allergies.  MEDICATIONS:  Current Outpatient Prescriptions  Medication Sig Dispense Refill  . albuterol (PROVENTIL HFA;VENTOLIN HFA) 108 (90 BASE) MCG/ACT inhaler Inhale 2 puffs into the lungs every 4 (four) hours as needed.      Marland Kitchen aspirin EC 81 MG tablet Take 81 mg by mouth every other day. Takes along with Aggrenox      . Aspirin-Dipyridamole (AGGRENOX PO) Take 25-200 mg by mouth 2 (two) times daily. Takes 2 tabs one day alt.  with 1 tab next day,etc.      . betaxolol (BETOPTIC-S) 0.25 % ophthalmic suspension Place 1 drop into both eyes daily. One drop in the left eye in the am....one drop in the right eye in the am and in the pm.      . irbesartan (AVAPRO) 300 MG tablet Take 300 mg by mouth daily.       Marland Kitchen lidocaine-prilocaine (EMLA) cream Apply 1 application topically as needed.      . Multiple Vitamin (MULTIVITAMIN PO) Take 1 tablet by mouth daily.        . niacin (NIASPAN) 500 MG CR tablet Take 500 mg by mouth at bedtime.       . Nutritional Supplements (EQUATE PO) Take by mouth. Daily      . omeprazole (PRILOSEC) 20 MG capsule Take 20 mg by mouth daily. Per Dr. Virgel Manifold      . prochlorperazine (COMPAZINE) 10 MG tablet Take 10 mg by mouth every 6 (six) hours as needed. For nausea      . rosuvastatin (CRESTOR) 40 MG tablet Take 20 mg by mouth daily.       Marland Kitchen tiotropium (SPIRIVA) 18 MCG inhalation capsule Place 18 mcg into inhaler and inhale daily.         No current facility-administered medications for this encounter.    REVIEW OF SYSTEMS:  As above  PHYSICAL EXAM:  height is 5\' 7"  (1.702 m) and weight is 158 lb 11.2 oz (71.986 kg). His temperature is 97.6 F (36.4 C). His blood pressure is 169/83 and his pulse is 75.   General: Alert and oriented, in no acute distress HEENT: Head is normocephalic.  Extraocular movements are intact.  Heart: Regular in rate and rhythm with no murmurs, rubs, or gallops. Chest: Clear to auscultation bilaterally, with no rhonchi, wheezes, or rales. Abdomen: Soft, nontender, nondistended, with no rigidity or guarding. Extremities: No cyanosis or edema. Skin: No concerning lesions. Musculoskeletal: symmetric strength and muscle tone throughout. Tender in lateral left rib cage at site of bony lesion on CT, but not in scapula. Tender in sacrum, right ilium, L acetabulum Neurologic: Cranial nerves II through XII are grossly intact. No obvious focalities. Speech is fluent. Coordination is  intact. Psychiatric: Judgment and insight are intact. Affect is appropriate.    LABORATORY DATA:  Lab Results  Component Value Date   WBC 2.1* 08/02/2012   HGB 10.3* 08/02/2012   HCT 31.3* 08/02/2012   MCV 101.0* 08/02/2012   PLT 93* 08/02/2012   CMP     Component Value Date/Time   NA 143 07/26/2012 1109   NA 137 03/10/2012 1804   K 4.0 07/26/2012 1109   K 3.7 03/10/2012 1804   CL 108* 07/26/2012 1109   CL 103 03/10/2012 1804   CO2 25 07/26/2012 1109   CO2 26 03/10/2012 1804   GLUCOSE 93 07/26/2012 1109   GLUCOSE 169* 03/10/2012 1804   BUN 18.3 07/26/2012 1109   BUN 25* 03/10/2012 1804   CREATININE  1.1 07/26/2012 1109   CREATININE 1.04 03/10/2012 1804   CALCIUM 8.9 07/26/2012 1109   CALCIUM 7.9* 03/10/2012 1804   PROT 6.9 07/26/2012 1109   PROT 5.7* 03/10/2012 1804   ALBUMIN 3.3* 07/26/2012 1109   ALBUMIN 2.9* 03/10/2012 1804   AST 20 07/26/2012 1109   AST 11 03/10/2012 1804   ALT 13 07/26/2012 1109   ALT 15 03/10/2012 1804   ALKPHOS 96 07/26/2012 1109   ALKPHOS 67 03/10/2012 1804   BILITOT 0.26 07/26/2012 1109   BILITOT 0.3 03/10/2012 1804   GFRNONAA 67* 03/10/2012 1804   GFRAA 77* 03/10/2012 1804         RADIOGRAPHY: Ct Chest W Contrast  07/04/2012   *RADIOLOGY REPORT*  Clinical Data:  Restaging squamous cell carcinoma.  Lung cancer bone metastasis.  CT CHEST, ABDOMEN AND PELVIS WITH CONTRAST  Technique:  Multidetector CT imaging of the chest, abdomen and pelvis was performed following the standard protocol during bolus administration of intravenous contrast.  Contrast: OMNIPAQUE IOHEXOL 300 MG/ML  SOLN  Comparison:   None.  CT CHEST  Findings:  In the right upper lobe,  nodule has increased in size and density compared to prior measuring 30 x 30 mm (image 15) compared to 26 x 22 mm on prior.  The most significant increase in size is noted in the coronal projection with the lesion measures 32 mm compared to approximately 21 mm on prior.  No additional pulmonary nodules are present.  Interval enlargement of  left axillary lymph node measuring 11 mm (image 16) increased from 6 mm on prior.  No supraclavicular lymphadenopathy.  No mediastinal lymphadenopathy.  No pericardial fluid.  Esophagus is normal.  IMPRESSION: 1.  Interval recurrence of lung cancer with enlarged right upper lobe pulmonary nodule which now qualifies as a  pulmonary mass. 2.  Interval enlargement of left axillary lymph node is most concerning for axillary nodal metastasis.  CT ABDOMEN AND PELVIS  Findings:  No focal hepatic lesion.  Gallbladder, pancreas, spleen, adrenal glands, kidneys are unchanged.  There are multiple mixed density cysts within left right kidney which are unchanged from comparison exam.  There is small nodule in the lateral aspect of the right pararenal space measuring 6 mm (image 59) which is unchanged from prior.  The stomach, small bowel, colon normal.  Abdominal aorta is normal caliber.  No retroperitoneal lymphadenopathy.  Small retrocrural nodes are similar to prior.  Again demonstrated are lytic lesions within the pelvis.    For example 3.1 cm lesion in the posterior right iliac bone is slightly increased from 2.7 cm on prior.  There is a large lesion in the right sacrum measures 5.5 cm compared to 4.3 on prior.  Lesion at L3 anteriorly measures 18 mm increased from the 14 mm on prior. Lesion within the right scapula right lateral rib are noted.  The right rib lesion is slightly increased.  No new lesions are identified.  IMPRESSION:  1. No evidence metastatic disease of soft tissues of the abdomen or pelvis. 2.  Small retroperitoneal nodule along the left pararenal space is unchanged. This was not hypermetabolic on comparison PET CT scan.  3.  Lytic lesions within the pelvis and spine and ribs are mildly increased in size compared to prior.  No new lesions are identified.   Original Report Authenticated By: Genevive Bi, M.D.   Ct Abdomen Pelvis W Contrast  07/04/2012   *RADIOLOGY REPORT*  Clinical Data:  Restaging  squamous cell carcinoma.  Lung  cancer bone metastasis.  CT CHEST, ABDOMEN AND PELVIS WITH CONTRAST  Technique:  Multidetector CT imaging of the chest, abdomen and pelvis was performed following the standard protocol during bolus administration of intravenous contrast.  Contrast: OMNIPAQUE IOHEXOL 300 MG/ML  SOLN  Comparison:   None.  CT CHEST  Findings:  In the right upper lobe,  nodule has increased in size and density compared to prior measuring 30 x 30 mm (image 15) compared to 26 x 22 mm on prior.  The most significant increase in size is noted in the coronal projection with the lesion measures 32 mm compared to approximately 21 mm on prior.  No additional pulmonary nodules are present.  Interval enlargement of left axillary lymph node measuring 11 mm (image 16) increased from 6 mm on prior.  No supraclavicular lymphadenopathy.  No mediastinal lymphadenopathy.  No pericardial fluid.  Esophagus is normal.  IMPRESSION: 1.  Interval recurrence of lung cancer with enlarged right upper lobe pulmonary nodule which now qualifies as a  pulmonary mass. 2.  Interval enlargement of left axillary lymph node is most concerning for axillary nodal metastasis.  CT ABDOMEN AND PELVIS  Findings:  No focal hepatic lesion.  Gallbladder, pancreas, spleen, adrenal glands, kidneys are unchanged.  There are multiple mixed density cysts within left right kidney which are unchanged from comparison exam.  There is small nodule in the lateral aspect of the right pararenal space measuring 6 mm (image 59) which is unchanged from prior.  The stomach, small bowel, colon normal.  Abdominal aorta is normal caliber.  No retroperitoneal lymphadenopathy.  Small retrocrural nodes are similar to prior.  Again demonstrated are lytic lesions within the pelvis.    For example 3.1 cm lesion in the posterior right iliac bone is slightly increased from 2.7 cm on prior.  There is a large lesion in the right sacrum measures 5.5 cm compared to 4.3 on  prior.  Lesion at L3 anteriorly measures 18 mm increased from the 14 mm on prior. Lesion within the right scapula right lateral rib are noted.  The right rib lesion is slightly increased.  No new lesions are identified.  IMPRESSION:  1. No evidence metastatic disease of soft tissues of the abdomen or pelvis. 2.  Small retroperitoneal nodule along the left pararenal space is unchanged. This was not hypermetabolic on comparison PET CT scan.  3.  Lytic lesions within the pelvis and spine and ribs are mildly increased in size compared to prior.  No new lesions are identified.   Original Report Authenticated By: Genevive Bi, M.D.      IMPRESSION/PLAN: Lovely 79yo patient with recurrent pain after good palliation from RT for metastatic bone lesions 5 mo ago.  I recommend IMRT to palliate his pain in his pelvic region and to spare excessive dose to his soft tissues/bowel.  Note he is receiving Gemcitabine, a radiosensitizer. Will fractionate at 30Gy in 15 fractions.  Will treat rib lesion with electrons to 30 Gy in 12 fractions. Patient and I spoke about the increased risks of bone/organ injury in the setting of reirradiation and the techniques of changed fractionation and IMRT to decrease these risks. He is enthusiastic to proceed. Consent signed, sim to occur today.  I spent 30 minutes minutes face to face with the patient and more than 50% of that time was spent in counseling and/or coordination of care.    __________________________________________   Lonie Peak, MD

## 2012-08-02 NOTE — Progress Notes (Signed)
  Radiation Oncology         [phone]) [phone] ________________________________  Name: Joel Gibson MRN: [id number]  Date: 08/02/2012  DOB: [birth year]  SIMULATION AND TREATMENT PLANNING NOTE - Spec tx proc. Note.  outpatient  DIAGNOSIS:  Bone metastases  NARRATIVE:  The patient was brought to the CT Simulation planning suite.  Identity was confirmed.  All relevant records and images related to the planned course of therapy were reviewed.  The patient freely provided informed written consent to proceed with treatment after reviewing the details related to the planned course of therapy. The consent form was witnessed and verified by the simulation staff.    Then, the patient was set-up in a stable reproducible  supine position for radiation therapy - arms overhead, legs in vaclock.  CT images were obtained.  Surface markings were placed.  The CT images were loaded into the planning software.    TREATMENT PLANNING NOTE: Treatment planning then occurred.  The radiation prescription was entered and confirmed.    A total of 1 medically necessary complex treatment devices were fabricated and supervised by me - vaclock. I have requested : Intensity Modulated Radiotherapy (IMRT) is medically necessary for this case for the following reason:  Previous treatment to this area of the pelvis.  IMRT will spare the bowel, bladder, rectum, bones.    The patient will receive  30Gy in 15 fractions to his bony pelvic metastases with IMRT (sacral, right ilium, left acetabulum).  30Gy in 12 fractions to left rib lesion with en face electrons.  Special port plan and custom electron cut out ordered.  Special Treatment Procedure Note: The patient will be receiving chemotherapy concurrently. Chemotherapy heightens the risk of side effects. I have considered this during the patient's treatment planning process and will monitor the patient accordingly for side effects on a weekly basis. Concurrent chemotherapy increases the  complexity of this patient's treatment and therefore this constitutes a special treatment procedure. This is also a special treatment procedure as I am reirradiating previously treated tissue which heightens risk of side effects.   -----------------------------------  Lonie Peak, MD

## 2012-08-02 NOTE — Telephone Encounter (Signed)
Notified patient of Neutrapenic  precautions- patient  voices understanding.

## 2012-08-02 NOTE — Progress Notes (Signed)
Histology and Location of Primary Cancer: Squamous Cell Carcinoma of the Lung  Sites of Visceral and Bony Metastatic Disease:   3.1 cm lesion in the posterior right iliac bone is slightly  increased from 2.7 cm on prior. There is a large lesion in the  right sacrum measures 5.5 cm compared to 4.3 on prior. Lesion at  L3 anteriorly measures 18 mm increased from the 14 mm on prior.  Lesion within the right scapula right lateral rib are noted. The  right rib lesion is slightly increased. No new lesions are  identified.    Location(s) of Symptomatic Metastases: Left Rib and bilateral hips  Past/Anticipated chemotherapy by medical oncology, if any: 5 cycles of Carboplatin and Abraxane.  Gemcitabine - first cycle 07/26/12.  Xgeva q 4 weeks - first dose expected on 08/02/12   Pain on a scale of 0-10 is: pain level -Ribs level 6 and bilateral hips - 8   If Spine Met(s), symptoms, if any, include:  Bowel/Bladder retention or incontinence (please describe): Reports change in bowels since starting Gemcitabine: smaller volume.  No change in urination  Numbness or weakness in extremities (please describe): States he feels off balnce because of his hip pain.  He admits to staggering at times.  Current Decadron regimen, if applicable: No  Ambulatory status? Walker? Wheelchair?: Ambulatory.  Advised to use cane when ambulating and has one at home.  SAFETY ISSUES: Prior radiation?  Radiation Therapy: 02/22/2012-03/07/2012  Site/dose: 1) pelvis and sacrum/30 Gray in 10 fractions  2) left scapula and rib/ 8 Gray in 1 fraction    Pacemaker/ICD? No  Possible current pregnancy? No  Is the patient on methotrexate? No  Current Complaints / other details: concerned about his gait and pain.

## 2012-08-02 NOTE — Patient Instructions (Addendum)
Denosumab injection What is this medicine? DENOSUMAB slows bone breakdown. It is used to treat osteoporosis in women after menopause and in men. This medicine is also used to prevent bone fractures and other bone problems caused by cancer bone metastases. This medicine may be used for other purposes; ask your health care provider or pharmacist if you have questions. What should I tell my health care provider before I take this medicine? They need to know if you have any of these conditions: -dental disease -eczema -infection or history of infections -kidney disease or on dialysis -low blood calcium or vitamin D -malabsorption syndrome -scheduled to have surgery or tooth extraction -taking medicine that contains denosumab -thyroid or parathyroid disease -an unusual reaction to denosumab, other medicines, foods, dyes, or preservatives -pregnant or trying to get pregnant -breast-feeding How should I use this medicine? This medicine is for injection under the skin. It is given by a health care professional in a hospital or clinic setting. If you are getting Prolia, a special MedGuide will be given to you by the pharmacist with each prescription and refill. Be sure to read this information carefully each time. Talk to your pediatrician regarding the use of this medicine in children. Special care may be needed. Overdosage: If you think you've taken too much of this medicine contact a poison control center or emergency room at once. Overdosage: If you think you have taken too much of this medicine contact a poison control center or emergency room at once. NOTE: This medicine is only for you. Do not share this medicine with others. What if I miss a dose? It is important not to miss your dose. Call your doctor or health care professional if you are unable to keep an appointment. What may interact with this medicine? Do not take this medicine with any of the following medications: -other medicines  containing denosumab This medicine may also interact with the following medications: -medicines that suppress the immune system -medicines that treat cancer -steroid medicines like prednisone or cortisone This list may not describe all possible interactions. Give your health care provider a list of all the medicines, herbs, non-prescription drugs, or dietary supplements you use. Also tell them if you smoke, drink alcohol, or use illegal drugs. Some items may interact with your medicine. What should I watch for while using this medicine? Visit your doctor or health care professional for regular checks on your progress. Your doctor or health care professional may order blood tests and other tests to see how you are doing. Call your doctor or health care professional if you get a cold or other infection while receiving this medicine. Do not treat yourself. This medicine may decrease your body's ability to fight infection. You should make sure you get enough calcium and vitamin D while you are taking this medicine, unless your doctor tells you not to. Discuss the foods you eat and the vitamins you take with your health care professional. See your dentist regularly. Brush and floss your teeth as directed. Before you have any dental work done, tell your dentist you are receiving this medicine. What side effects may I notice from receiving this medicine? Side effects that you should report to your doctor or health care professional as soon as possible: -allergic reactions like skin rash, itching or hives, swelling of the face, lips, or tongue -breathing problems -chest pain -fast, irregular heartbeat -feeling faint or lightheaded, falls -fever, chills, or any other sign of infection -muscle spasms, tightening, or twitches -numbness   or tingling -skin blisters or bumps, or is dry, peels, or red -slow healing or unexplained pain in the mouth or jaw -unusual bleeding or bruising Side effects that  usually do not require medical attention (Report these to your doctor or health care professional if they continue or are bothersome.): -muscle pain -stomach upset, gas This list may not describe all possible side effects. Call your doctor for medical advice about side effects. You may report side effects to FDA at 1-800-FDA-1088. Where should I keep my medicine? This medicine is only given in a clinic, doctor's office, or other health care setting and will not be stored at home. NOTE: This sheet is a summary. It may not cover all possible information. If you have questions about this medicine, talk to your doctor, pharmacist, or health care provider.  2013, [employer]. (12/02/2010 3:40:41 PM)  

## 2012-08-02 NOTE — Telephone Encounter (Signed)
Message copied by Charma Igo on Tue Aug 02, 2012  5:03 PM ------      Message from: Conni Slipper      Created: Tue Aug 02, 2012  1:08 PM       Abnormal results, please call  and notify patient with neutropenic precautions ------

## 2012-08-02 NOTE — Progress Notes (Signed)
1200-Proceed with Rivka Barbara today and hold Gemzar d/t CBC results per Dr. Arbutus Ped.  Pt to return in 2 weeks for next cycle as currently scheduled.

## 2012-08-03 ENCOUNTER — Telehealth: Payer: Self-pay | Admitting: Medical Oncology

## 2012-08-03 NOTE — Telephone Encounter (Signed)
Had xgeva yesterday. Feels good , but weak. BP measured on home machine  Arm-100/54 -87, wrist 103/49. I reminded pt about neutrapenic precautions and to call for temp >/=100.5 F and to increase fluids.

## 2012-08-08 ENCOUNTER — Telehealth: Payer: Self-pay | Admitting: *Deleted

## 2012-08-08 ENCOUNTER — Other Ambulatory Visit (HOSPITAL_BASED_OUTPATIENT_CLINIC_OR_DEPARTMENT_OTHER): Payer: Medicare Other | Admitting: Lab

## 2012-08-08 DIAGNOSIS — C3490 Malignant neoplasm of unspecified part of unspecified bronchus or lung: Secondary | ICD-10-CM

## 2012-08-08 LAB — CBC WITH DIFFERENTIAL/PLATELET
Basophils Absolute: 0 10*3/uL (ref 0.0–0.1)
EOS%: 2.6 % (ref 0.0–7.0)
HCT: 31.3 % — ABNORMAL LOW (ref 38.4–49.9)
HGB: 10.6 g/dL — ABNORMAL LOW (ref 13.0–17.1)
LYMPH%: 10.3 % — ABNORMAL LOW (ref 14.0–49.0)
MCH: 34.9 pg — ABNORMAL HIGH (ref 27.2–33.4)
MCV: 103.1 fL — ABNORMAL HIGH (ref 79.3–98.0)
MONO%: 9.7 % (ref 0.0–14.0)
NEUT%: 77.1 % — ABNORMAL HIGH (ref 39.0–75.0)
Platelets: 80 10*3/uL — ABNORMAL LOW (ref 140–400)

## 2012-08-08 LAB — COMPREHENSIVE METABOLIC PANEL (CC13)
AST: 19 U/L (ref 5–34)
Albumin: 3.2 g/dL — ABNORMAL LOW (ref 3.5–5.0)
Alkaline Phosphatase: 102 U/L (ref 40–150)
Potassium: 3.8 mEq/L (ref 3.5–5.1)
Sodium: 141 mEq/L (ref 136–145)
Total Bilirubin: 0.42 mg/dL (ref 0.20–1.20)
Total Protein: 6.9 g/dL (ref 6.4–8.3)

## 2012-08-08 NOTE — Telephone Encounter (Signed)
Dental clearance letter given to Dr Donnald Garre to review.  SLJ

## 2012-08-09 ENCOUNTER — Other Ambulatory Visit: Payer: Medicare Other

## 2012-08-11 ENCOUNTER — Encounter: Payer: Self-pay | Admitting: Radiation Oncology

## 2012-08-11 ENCOUNTER — Ambulatory Visit
Admission: RE | Admit: 2012-08-11 | Discharge: 2012-08-11 | Disposition: A | Discharge status: Home / Self Care | Payer: Medicare Other | Source: Ambulatory Visit | Attending: Radiation Oncology | Admitting: Radiation Oncology

## 2012-08-11 ENCOUNTER — Ambulatory Visit: Payer: Medicare Other | Admitting: Radiation Oncology

## 2012-08-12 ENCOUNTER — Ambulatory Visit
Admission: RE | Admit: 2012-08-12 | Discharge: 2012-08-12 | Disposition: A | Discharge status: Home / Self Care | Payer: Medicare Other | Source: Ambulatory Visit | Attending: Radiation Oncology | Admitting: Radiation Oncology

## 2012-08-15 ENCOUNTER — Ambulatory Visit
Admission: RE | Admit: 2012-08-15 | Discharge: 2012-08-15 | Disposition: A | Discharge status: Home / Self Care | Payer: Medicare Other | Source: Ambulatory Visit | Attending: Radiation Oncology | Admitting: Radiation Oncology

## 2012-08-15 ENCOUNTER — Ambulatory Visit: Payer: Medicare Other | Admitting: Radiation Oncology

## 2012-08-15 ENCOUNTER — Other Ambulatory Visit (HOSPITAL_BASED_OUTPATIENT_CLINIC_OR_DEPARTMENT_OTHER): Payer: Medicare Other | Admitting: Lab

## 2012-08-15 ENCOUNTER — Ambulatory Visit: Admission: RE | Admit: 2012-08-15 | Payer: Medicare Other | Source: Ambulatory Visit | Admitting: Radiation Oncology

## 2012-08-15 ENCOUNTER — Ambulatory Visit (HOSPITAL_BASED_OUTPATIENT_CLINIC_OR_DEPARTMENT_OTHER): Payer: Medicare Other

## 2012-08-15 DIAGNOSIS — C7951 Secondary malignant neoplasm of bone: Secondary | ICD-10-CM

## 2012-08-15 DIAGNOSIS — Z5111 Encounter for antineoplastic chemotherapy: Secondary | ICD-10-CM

## 2012-08-15 DIAGNOSIS — C3410 Malignant neoplasm of upper lobe, unspecified bronchus or lung: Secondary | ICD-10-CM

## 2012-08-15 LAB — CBC WITH DIFFERENTIAL/PLATELET
Basophils Absolute: 0 10*3/uL (ref 0.0–0.1)
EOS%: 2.3 % (ref 0.0–7.0)
Eosinophils Absolute: 0.1 10*3/uL (ref 0.0–0.5)
LYMPH%: 15.9 % (ref 14.0–49.0)
MCH: 33.2 pg (ref 27.2–33.4)
MCV: 102.5 fL — ABNORMAL HIGH (ref 79.3–98.0)
MONO%: 14.6 % — ABNORMAL HIGH (ref 0.0–14.0)
NEUT#: 3.5 10*3/uL (ref 1.5–6.5)
Platelets: 179 10*3/uL (ref 140–400)
RBC: 3.19 10*6/uL — ABNORMAL LOW (ref 4.20–5.82)
RDW: 14.2 % (ref 11.0–14.6)
nRBC: 0 % (ref 0–0)

## 2012-08-15 LAB — COMPREHENSIVE METABOLIC PANEL (CC13)
Alkaline Phosphatase: 103 U/L (ref 40–150)
CO2: 26 mEq/L (ref 22–29)
Creatinine: 1.2 mg/dL (ref 0.7–1.3)
Glucose: 102 mg/dl — ABNORMAL HIGH (ref 70–99)
Sodium: 139 mEq/L (ref 136–145)
Total Bilirubin: 0.37 mg/dL (ref 0.20–1.20)

## 2012-08-15 MED ORDER — HEPARIN SOD (PORK) LOCK FLUSH 100 UNIT/ML IV SOLN
500.0000 [IU] | Freq: Once | INTRAVENOUS | Status: AC | PRN
  Administered 2012-08-15: 500 [IU]
  Filled 2012-08-15: qty 5

## 2012-08-15 MED ORDER — PROCHLORPERAZINE MALEATE 10 MG PO TABS
10.0000 mg | ORAL_TABLET | Freq: Once | ORAL | Status: AC
  Administered 2012-08-15: 10 mg via ORAL

## 2012-08-15 MED ORDER — SODIUM CHLORIDE 0.9 % IV SOLN
Freq: Once | INTRAVENOUS | Status: AC
  Administered 2012-08-15: 13:00:00 via INTRAVENOUS

## 2012-08-15 MED ORDER — SODIUM CHLORIDE 0.9 % IJ SOLN
10.0000 mL | INTRAMUSCULAR | Status: DC | PRN
  Administered 2012-08-15: 10 mL
  Filled 2012-08-15: qty 10

## 2012-08-15 MED ORDER — OXYCODONE HCL 5 MG PO TABS: 5.0000 mg | ORAL_TABLET | ORAL | Status: DC | PRN

## 2012-08-15 MED ORDER — SODIUM CHLORIDE 0.9 % IV SOLN
1000.0000 mg/m2 | Freq: Once | INTRAVENOUS | Status: AC
  Administered 2012-08-15: 1862 mg via INTRAVENOUS
  Filled 2012-08-15: qty 49.03

## 2012-08-15 MED ORDER — TEMAZEPAM 15 MG PO CAPS: 15.0000 mg | ORAL_CAPSULE | Freq: Every evening | ORAL | Status: DC | PRN

## 2012-08-15 MED ORDER — MORPHINE SULFATE ER 15 MG PO TBCR: 15.0000 mg | EXTENDED_RELEASE_TABLET | Freq: Two times a day (BID) | ORAL | Status: DC

## 2012-08-15 NOTE — Progress Notes (Signed)
   Weekly Management Note:  outpatient Current Dose:  6 of 30Gy  To pelvis; 7.5 of 30 Gy to rib    Narrative:  The patient presents for routine under treatment assessment.  CBCT/MVCT images/Port film x-rays were reviewed.  The chart was checked. Still having a lot of pain.  Worst at night when he rests. He thinks a sleeping aid will help as he has trouble sleeping  Physical Findings:  vitals were not taken for this visit. Patient did not go to nursing - he was late for infusion at med/onc. NAD  CBC    Component Value Date/Time   WBC 5.3 08/15/2012 1129   WBC 5.8 03/10/2012 1804   RBC 3.19* 08/15/2012 1129   RBC 3.63* 03/10/2012 1804   HGB 10.6* 08/15/2012 1129   HGB 11.3* 03/10/2012 1804   HCT 32.7* 08/15/2012 1129   HCT 32.4* 03/10/2012 1804   PLT 179 08/15/2012 1129   PLT 123* 03/10/2012 1804   MCV 102.5* 08/15/2012 1129   MCV 89.3 03/10/2012 1804   MCH 33.2 08/15/2012 1129   MCH 31.1 03/10/2012 1804   MCHC 32.4 08/15/2012 1129   MCHC 34.9 03/10/2012 1804   RDW 14.2 08/15/2012 1129   RDW 12.2 03/10/2012 1804   LYMPHSABS 0.8* 08/15/2012 1129   LYMPHSABS 0.2* 03/10/2012 1804   MONOABS 0.8 08/15/2012 1129   MONOABS 0.3 03/10/2012 1804   EOSABS 0.1 08/15/2012 1129   EOSABS 0.1 03/10/2012 1804   BASOSABS 0.0 08/15/2012 1129   BASOSABS 0.0 03/10/2012 1804   CMP     Component Value Date/Time   NA 141 08/08/2012 1117   NA 137 03/10/2012 1804   K 3.8 08/08/2012 1117   K 3.7 03/10/2012 1804   CL 109* 08/08/2012 1117   CL 103 03/10/2012 1804   CO2 24 08/08/2012 1117   CO2 26 03/10/2012 1804   GLUCOSE 151* 08/08/2012 1117   GLUCOSE 169* 03/10/2012 1804   BUN 18.3 08/08/2012 1117   BUN 25* 03/10/2012 1804   CREATININE 1.1 08/08/2012 1117   CREATININE 1.04 03/10/2012 1804   CALCIUM 8.0* 08/08/2012 1117   CALCIUM 7.9* 03/10/2012 1804   PROT 6.9 08/08/2012 1117   PROT 5.7* 03/10/2012 1804   ALBUMIN 3.2* 08/08/2012 1117   ALBUMIN 2.9* 03/10/2012 1804   AST 19 08/08/2012 1117   AST 11 03/10/2012 1804   ALT 19 08/08/2012 1117   ALT 15 03/10/2012 1804   ALKPHOS 102  08/08/2012 1117   ALKPHOS 67 03/10/2012 1804   BILITOT 0.42 08/08/2012 1117   BILITOT 0.3 03/10/2012 1804   GFRNONAA 67* 03/10/2012 1804   GFRAA 77* 03/10/2012 1804     Impression:  The patient is tolerating radiotherapy.  Plan:  Continue radiotherapy as planned. Rx given to use Oxycodone 5mg  q4hrs prn for breakthrough pain. Continue MS Contin 15 mg q 12 or 8 hrs.  Rx for temazepam given for sleep.  ________________________________   Lonie Peak, M.D.

## 2012-08-15 NOTE — Patient Instructions (Addendum)
[facility] Discharge Instructions for Patients Receiving Chemotherapy  Today you received the following chemotherapy agents gemzar  To help prevent nausea and vomiting after your treatment, we encourage you to take your nausea medication if needed.   If you develop nausea and vomiting that is not controlled by your nausea medication, call the clinic.   BELOW ARE SYMPTOMS THAT SHOULD BE REPORTED IMMEDIATELY:  *FEVER GREATER THAN 100.5 F  *CHILLS WITH OR WITHOUT FEVER  NAUSEA AND VOMITING THAT IS NOT CONTROLLED WITH YOUR NAUSEA MEDICATION  *UNUSUAL SHORTNESS OF BREATH  *UNUSUAL BRUISING OR BLEEDING  TENDERNESS IN MOUTH AND THROAT WITH OR WITHOUT PRESENCE OF ULCERS  *URINARY PROBLEMS  *BOWEL PROBLEMS  UNUSUAL RASH Items with * indicate a potential emergency and should be followed up as soon as possible.  Feel free to call the clinic you have any questions or concerns. The clinic phone number is [phone].    

## 2012-08-16 ENCOUNTER — Ambulatory Visit: Payer: Medicare Other

## 2012-08-16 ENCOUNTER — Ambulatory Visit
Admission: RE | Admit: 2012-08-16 | Discharge: 2012-08-16 | Disposition: A | Discharge status: Home / Self Care | Payer: Medicare Other | Source: Ambulatory Visit | Attending: Radiation Oncology | Admitting: Radiation Oncology

## 2012-08-16 ENCOUNTER — Other Ambulatory Visit: Payer: Medicare Other | Admitting: Lab

## 2012-08-17 ENCOUNTER — Ambulatory Visit
Admission: RE | Admit: 2012-08-17 | Discharge: 2012-08-17 | Disposition: A | Discharge status: Home / Self Care | Payer: Medicare Other | Source: Ambulatory Visit | Attending: Radiation Oncology | Admitting: Radiation Oncology

## 2012-08-18 ENCOUNTER — Ambulatory Visit
Admission: RE | Admit: 2012-08-18 | Discharge: 2012-08-18 | Disposition: A | Discharge status: Home / Self Care | Payer: Medicare Other | Source: Ambulatory Visit | Attending: Radiation Oncology | Admitting: Radiation Oncology

## 2012-08-19 ENCOUNTER — Ambulatory Visit
Admission: RE | Admit: 2012-08-19 | Discharge: 2012-08-19 | Disposition: A | Discharge status: Home / Self Care | Payer: Medicare Other | Source: Ambulatory Visit | Attending: Radiation Oncology | Admitting: Radiation Oncology

## 2012-08-22 ENCOUNTER — Ambulatory Visit
Admission: RE | Admit: 2012-08-22 | Discharge: 2012-08-22 | Disposition: A | Discharge status: Home / Self Care | Payer: Medicare Other | Source: Ambulatory Visit | Attending: Radiation Oncology | Admitting: Radiation Oncology

## 2012-08-22 ENCOUNTER — Ambulatory Visit (HOSPITAL_BASED_OUTPATIENT_CLINIC_OR_DEPARTMENT_OTHER): Payer: Medicare Other

## 2012-08-22 ENCOUNTER — Other Ambulatory Visit: Payer: Self-pay | Admitting: Radiation Oncology

## 2012-08-22 ENCOUNTER — Telehealth: Payer: Self-pay | Admitting: *Deleted

## 2012-08-22 ENCOUNTER — Telehealth: Payer: Self-pay | Admitting: Internal Medicine

## 2012-08-22 ENCOUNTER — Ambulatory Visit (HOSPITAL_BASED_OUTPATIENT_CLINIC_OR_DEPARTMENT_OTHER): Payer: Medicare Other | Admitting: Physician Assistant

## 2012-08-22 ENCOUNTER — Other Ambulatory Visit (HOSPITAL_BASED_OUTPATIENT_CLINIC_OR_DEPARTMENT_OTHER): Payer: Medicare Other

## 2012-08-22 DIAGNOSIS — C7952 Secondary malignant neoplasm of bone marrow: Secondary | ICD-10-CM

## 2012-08-22 DIAGNOSIS — C7951 Secondary malignant neoplasm of bone: Secondary | ICD-10-CM

## 2012-08-22 DIAGNOSIS — C3410 Malignant neoplasm of upper lobe, unspecified bronchus or lung: Secondary | ICD-10-CM

## 2012-08-22 DIAGNOSIS — Z5111 Encounter for antineoplastic chemotherapy: Secondary | ICD-10-CM

## 2012-08-22 LAB — CBC WITH DIFFERENTIAL/PLATELET
Basophils Absolute: 0 10*3/uL (ref 0.0–0.1)
EOS%: 0 % (ref 0.0–7.0)
HCT: 31.3 % — ABNORMAL LOW (ref 38.4–49.9)
HGB: 10.3 g/dL — ABNORMAL LOW (ref 13.0–17.1)
LYMPH%: 18.8 % (ref 14.0–49.0)
MCH: 32.4 pg (ref 27.2–33.4)
MCHC: 32.9 g/dL (ref 32.0–36.0)
MCV: 98.4 fL — ABNORMAL HIGH (ref 79.3–98.0)
MONO%: 12.5 % (ref 0.0–14.0)
NEUT%: 68.7 % (ref 39.0–75.0)
Platelets: 99 10*3/uL — ABNORMAL LOW (ref 140–400)
lymph#: 0.4 10*3/uL — ABNORMAL LOW (ref 0.9–3.3)

## 2012-08-22 LAB — COMPREHENSIVE METABOLIC PANEL (CC13)
AST: 22 U/L (ref 5–34)
BUN: 20.5 mg/dL (ref 7.0–26.0)
Calcium: 8.3 mg/dL — ABNORMAL LOW (ref 8.4–10.4)
Chloride: 108 mEq/L — ABNORMAL HIGH (ref 98–107)
Creatinine: 1.1 mg/dL (ref 0.7–1.3)
Total Bilirubin: 0.37 mg/dL (ref 0.20–1.20)

## 2012-08-22 MED ORDER — SODIUM CHLORIDE 0.9 % IV SOLN
1000.0000 mg/m2 | Freq: Once | INTRAVENOUS | Status: AC
  Administered 2012-08-22: 1862 mg via INTRAVENOUS
  Filled 2012-08-22: qty 49.03

## 2012-08-22 MED ORDER — SODIUM CHLORIDE 0.9 % IV SOLN
Freq: Once | INTRAVENOUS | Status: AC
  Administered 2012-08-22: 14:00:00 via INTRAVENOUS

## 2012-08-22 MED ORDER — SODIUM CHLORIDE 0.9 % IJ SOLN
10.0000 mL | INTRAMUSCULAR | Status: DC | PRN
  Administered 2012-08-22: 10 mL
  Filled 2012-08-22: qty 10

## 2012-08-22 MED ORDER — PEGFILGRASTIM INJECTION 6 MG/0.6ML
6.0000 mg | Freq: Once | SUBCUTANEOUS | Status: DC
  Filled 2012-08-22: qty 0.6

## 2012-08-22 MED ORDER — HEPARIN SOD (PORK) LOCK FLUSH 100 UNIT/ML IV SOLN
500.0000 [IU] | Freq: Once | INTRAVENOUS | Status: AC | PRN
  Administered 2012-08-22: 500 [IU]
  Filled 2012-08-22: qty 5

## 2012-08-22 MED ORDER — PROCHLORPERAZINE MALEATE 10 MG PO TABS
10.0000 mg | ORAL_TABLET | Freq: Once | ORAL | Status: AC
  Administered 2012-08-22: 10 mg via ORAL

## 2012-08-22 MED ORDER — OXYCODONE HCL 5 MG PO TABS: 5.0000 mg | ORAL_TABLET | ORAL | Status: DC | PRN

## 2012-08-22 NOTE — Patient Instructions (Signed)
[facility] Discharge Instructions for Patients Receiving Chemotherapy  Today you received the following chemotherapy agents gemzar  To help prevent nausea and vomiting after your treatment, we encourage you to take your nausea medication as needed   If you develop nausea and vomiting that is not controlled by your nausea medication, call the clinic.   BELOW ARE SYMPTOMS THAT SHOULD BE REPORTED IMMEDIATELY:  *FEVER GREATER THAN 100.5 F  *CHILLS WITH OR WITHOUT FEVER  NAUSEA AND VOMITING THAT IS NOT CONTROLLED WITH YOUR NAUSEA MEDICATION  *UNUSUAL SHORTNESS OF BREATH  *UNUSUAL BRUISING OR BLEEDING  TENDERNESS IN MOUTH AND THROAT WITH OR WITHOUT PRESENCE OF ULCERS  *URINARY PROBLEMS  *BOWEL PROBLEMS  UNUSUAL RASH Items with * indicate a potential emergency and should be followed up as soon as possible.  Feel free to call the clinic you have any questions or concerns. The clinic phone number is [phone].    

## 2012-08-22 NOTE — Progress Notes (Signed)
   Weekly Management Note:  Outpatient Current Dose:  16/30 Gy  To pelvis, 20/30Gy to rib.    Narrative:  The patient presents for routine under treatment assessment.  CBCT/MVCT images/Port film x-rays were reviewed.  The chart was checked. Pain gone from rib; much better in pelvic bones. Can move legs /flex at hips with little pain. Managing constipation, no diarrhea or nausea.  Physical Findings:  vitals were not taken for this visit. Pt not seen in nursing - had to go up to chemotherapy infusion.  NAD, non toxic appearing.  CBC    Component Value Date/Time   WBC 2.1* 08/22/2012 1042   WBC 5.8 03/10/2012 1804   RBC 3.18* 08/22/2012 1042   RBC 3.63* 03/10/2012 1804   HGB 10.3* 08/22/2012 1042   HGB 11.3* 03/10/2012 1804   HCT 31.3* 08/22/2012 1042   HCT 32.4* 03/10/2012 1804   PLT 99* 08/22/2012 1042   PLT 123* 03/10/2012 1804   MCV 98.4* 08/22/2012 1042   MCV 89.3 03/10/2012 1804   MCH 32.4 08/22/2012 1042   MCH 31.1 03/10/2012 1804   MCHC 32.9 08/22/2012 1042   MCHC 34.9 03/10/2012 1804   RDW 13.6 08/22/2012 1042   RDW 12.2 03/10/2012 1804   LYMPHSABS 0.4* 08/22/2012 1042   LYMPHSABS 0.2* 03/10/2012 1804   MONOABS 0.3 08/22/2012 1042   MONOABS 0.3 03/10/2012 1804   EOSABS 0.0 08/22/2012 1042   EOSABS 0.1 03/10/2012 1804   BASOSABS 0.0 08/22/2012 1042   BASOSABS 0.0 03/10/2012 1804    CMP     Component Value Date/Time   NA 139 08/15/2012 1129   NA 137 03/10/2012 1804   K 4.2 08/15/2012 1129   K 3.7 03/10/2012 1804   CL 106 08/15/2012 1129   CL 103 03/10/2012 1804   CO2 26 08/15/2012 1129   CO2 26 03/10/2012 1804   GLUCOSE 102* 08/15/2012 1129   GLUCOSE 169* 03/10/2012 1804   BUN 18.0 08/15/2012 1129   BUN 25* 03/10/2012 1804   CREATININE 1.2 08/15/2012 1129   CREATININE 1.04 03/10/2012 1804   CALCIUM 8.7 08/15/2012 1129   CALCIUM 7.9* 03/10/2012 1804   PROT 6.9 08/15/2012 1129   PROT 5.7* 03/10/2012 1804   ALBUMIN 3.3* 08/15/2012 1129   ALBUMIN 2.9* 03/10/2012 1804   AST 16 08/15/2012 1129   AST 11 03/10/2012 1804   ALT 13  08/15/2012 1129   ALT 15 03/10/2012 1804   ALKPHOS 103 08/15/2012 1129   ALKPHOS 67 03/10/2012 1804   BILITOT 0.37 08/15/2012 1129   BILITOT 0.3 03/10/2012 1804   GFRNONAA 67* 03/10/2012 1804   GFRAA 77* 03/10/2012 1804    Impression:  The patient is tolerating radiotherapy.  Plan:  Continue radiotherapy as planned.  ________________________________   Lonie Peak, M.D.

## 2012-08-22 NOTE — Telephone Encounter (Signed)
Per staff phone I have adjusted appt for 6/30

## 2012-08-22 NOTE — Progress Notes (Signed)
Per Dr Donnald Garre ok to tx today despite counts.  SLJ

## 2012-08-22 NOTE — Telephone Encounter (Signed)
gave pt appt for lab, MD and chemo for june and July 2014

## 2012-08-22 NOTE — Patient Instructions (Addendum)
Followup with Dr. Arbutus Ped in 2 weeks for another symptom management visit prior to your next scheduled cycle of chemotherapy

## 2012-08-23 ENCOUNTER — Ambulatory Visit: Payer: Medicare Other

## 2012-08-23 ENCOUNTER — Other Ambulatory Visit: Payer: Medicare Other | Admitting: Lab

## 2012-08-23 ENCOUNTER — Ambulatory Visit (HOSPITAL_BASED_OUTPATIENT_CLINIC_OR_DEPARTMENT_OTHER): Payer: Medicare Other

## 2012-08-23 ENCOUNTER — Ambulatory Visit
Admission: RE | Admit: 2012-08-23 | Discharge: 2012-08-23 | Disposition: A | Discharge status: Home / Self Care | Payer: Medicare Other | Source: Ambulatory Visit | Attending: Radiation Oncology | Admitting: Radiation Oncology

## 2012-08-23 VITALS — BP 140/62 | HR 97 | Temp 97.8°F

## 2012-08-23 DIAGNOSIS — Z5189 Encounter for other specified aftercare: Secondary | ICD-10-CM

## 2012-08-23 DIAGNOSIS — C3410 Malignant neoplasm of upper lobe, unspecified bronchus or lung: Secondary | ICD-10-CM

## 2012-08-23 DIAGNOSIS — C7951 Secondary malignant neoplasm of bone: Secondary | ICD-10-CM

## 2012-08-23 MED ORDER — PEGFILGRASTIM INJECTION 6 MG/0.6ML
6.0000 mg | Freq: Once | SUBCUTANEOUS | Status: AC
  Administered 2012-08-23: 6 mg via SUBCUTANEOUS
  Filled 2012-08-23: qty 0.6

## 2012-08-23 NOTE — Patient Instructions (Addendum)

## 2012-08-23 NOTE — Progress Notes (Signed)
[facility] [phone]   [phone]  OFFICE PROGRESS NOTE  [name], MD [address] [facility], [place]. 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   PRIOR THERAPY:  1) Status post palliative radiotherapy to the right hip and sacrum under the care of Dr. Basilio Cairo.  2) Systemic chemotherapy with carboplatin for AUC of 6 on day 1 and Abraxane 100 mg/M2 on days 1, 8 and 15 every 3 weeks. The patient is status post 5 cycles.  CURRENT THERAPY:  1) Gemcitabine 1000 mg/M2 on days 1 and 8 every 3 weeks. Status post 1 cycle as well as day 1 of cycle 2 2) Xgeva 120 mg subcutaneously every 4 weeks. Status post fiirst dose given on 08/02/2012.  INTERVAL HISTORY: Joel Gibson 79 y.o. male returns to the clinic today for followup visit accompanied his wife. The patient is feeling fine today with no specific complaints. He reports improvement in his bilateral hip pain and no longer has the left rib cage pain. He still is undergoing palliative radiotherapy to these areas.  He denied having any significant weight loss or night sweats. He denied having any chest pain but continues to have shortness breath with exertion. He denied having any cough or hemoptysis. The patient was seen by his dentist recently and was cleared to proceed with the treatment with Rivka Barbara, and is now status post 1 treatment.   MEDICAL HISTORY: Past Medical History  Diagnosis Date  . Hyperlipidemia   . Hypertension   . Aortic stenosis     mild to moderate  . Gastritis   . COPD (chronic obstructive pulmonary disease)   . Bronchitis   . Hepatitis     possible  . Hx of cataract surgery     lt eye  . Tobacco abuse   . Lung cancer 02/02/2012  . Shortness of breath     with activity  . Retinal vein occlusion 1990  . GERD (gastroesophageal reflux  disease)   . Hemorrhoid   . Aortic stenosis   . Hypertension   . Dyslipidemia   . Glaucoma   . Right rib fracture      Pathological Fracture / 4 weeks ago  . Lung mass     Right Upper Lobe - Lobular Mass  . Bone metastases     Widespread/ Lytic Lesion Left 7t Rib, Left Iliac Pelvis, ribs and scapula  . SOB (shortness of breath)     Mild  . Cough   . Stroke 2002    Right slight difference in vision  . S/P radiation therapy 02/22/12 - 03/07/12    Pelvis/Sacrum/30 Gray/10 Fractions and Left Scapula/Rib/ 8 Gray/1 Fraction   . On antineoplastic chemotherapy      Carboplatin/ Abraxane    ALLERGIES:  has No Known Allergies.  MEDICATIONS:  Current Outpatient Prescriptions  Medication Sig Dispense Refill  . Calcium Carbonate (CALCIUM 600 PO) Take 600 mg by mouth 2 (two) times daily.      Marland Kitchen albuterol (PROVENTIL HFA;VENTOLIN HFA) 108 (90 BASE) MCG/ACT inhaler Inhale 2 puffs into the lungs every 4 (four) hours as needed.      Marland Kitchen aspirin EC 81 MG tablet Take 81 mg by mouth every other day. Takes along with Aggrenox      . Aspirin-Dipyridamole (AGGRENOX PO) Take 25-200 mg by mouth 2 (two) times daily. Takes 2  tabs one day alt. with 1 tab next day,etc.      . betaxolol (BETOPTIC-S) 0.25 % ophthalmic suspension Place 1 drop into both eyes daily. One drop in the left eye in the am....one drop in the right eye in the am and in the pm.      . irbesartan (AVAPRO) 300 MG tablet Take 300 mg by mouth daily.       Marland Kitchen lidocaine-prilocaine (EMLA) cream Apply 1 application topically as needed.      Marland Kitchen morphine (MS CONTIN) 15 MG 12 hr tablet Take 1 tablet (15 mg total) by mouth 2 (two) times daily.    0  . Multiple Vitamin (MULTIVITAMIN PO) Take 1 tablet by mouth daily.        . niacin (NIASPAN) 500 MG CR tablet Take 500 mg by mouth at bedtime.       . Nutritional Supplements (EQUATE PO) Take by mouth. Daily      . omeprazole (PRILOSEC) 20 MG capsule Take 20 mg by mouth daily. Per Dr. Virgel Manifold      . oxyCODONE  (OXY IR/ROXICODONE) 5 MG immediate release tablet Take 1 tablet (5 mg total) by mouth every 4 (four) hours as needed for pain.  45 tablet  0  . prochlorperazine (COMPAZINE) 10 MG tablet Take 10 mg by mouth every 6 (six) hours as needed. For nausea      . rosuvastatin (CRESTOR) 40 MG tablet Take 20 mg by mouth daily.       . temazepam (RESTORIL) 15 MG capsule Take 1 capsule (15 mg total) by mouth at bedtime as needed for sleep.  30 capsule  0  . tiotropium (SPIRIVA) 18 MCG inhalation capsule Place 18 mcg into inhaler and inhale daily.         Current Facility-Administered Medications  Medication Dose Route Frequency Provider Last Rate Last Dose  . pegfilgrastim (NEULASTA) injection 6 mg  6 mg Subcutaneous Once Conni Slipper, PA-C        SURGICAL HISTORY:  Past Surgical History  Procedure Laterality Date  . Vasectomy    . Tonsillectomy    . Leg surgery      rt for nerve impingement  . Collarbone fracture      1977  . Tonsillectomy    . Eye surgery      catarct bil  . Portacath placement  02/08/2012    Procedure: INSERTION PORT-A-CATH;  Surgeon: [name], MD;  Location: [facility] OR;  Service: Thoracic;  Laterality: Right;  . Ct guided core biopsy  01/26/12    Left Lytic Lesion - Metastatic Squamous Cell Carcinoma  . Skin biopsy  02/09/12    Right Upper Back, shave : Melanoma In Situ Arising  From a Dysplastic Nevus    REVIEW OF SYSTEMS:  A comprehensive review of systems was negative except for: Musculoskeletal: positive for bone pain   PHYSICAL EXAMINATION: General appearance: alert, cooperative and no distress Head: Normocephalic, without obvious abnormality, atraumatic Neck: no adenopathy Lymph nodes: Cervical, supraclavicular, and axillary nodes normal. Resp: clear to auscultation bilaterally Cardio: regular rate and rhythm, S1, S2 normal, no murmur, click, rub or gallop GI: soft, non-tender; bowel sounds normal; no masses,  no organomegaly Extremities: extremities normal,  atraumatic, no cyanosis or edema Neurologic: Alert and oriented X 3, normal strength and tone. Normal symmetric reflexes. Normal coordination and gait  ECOG PERFORMANCE STATUS: 1 - Symptomatic but completely ambulatory  Blood pressure 163/81, pulse 85, temperature 96.8 F (36 C), temperature  source Oral, resp. rate 18, height 5\' 7"  (1.702 m), weight 154 lb 4.8 oz (69.99 kg), SpO2 100.00%.  LABORATORY DATA: Lab Results  Component Value Date   WBC 2.1* 08/22/2012   HGB 10.3* 08/22/2012   HCT 31.3* 08/22/2012   MCV 98.4* 08/22/2012   PLT 99* 08/22/2012      Chemistry      Component Value Date/Time   NA 139 08/22/2012 1043   NA 137 03/10/2012 1804   K 4.0 08/22/2012 1043   K 3.7 03/10/2012 1804   CL 108* 08/22/2012 1043   CL 103 03/10/2012 1804   CO2 22 08/22/2012 1043   CO2 26 03/10/2012 1804   BUN 20.5 08/22/2012 1043   BUN 25* 03/10/2012 1804   CREATININE 1.1 08/22/2012 1043   CREATININE 1.04 03/10/2012 1804      Component Value Date/Time   CALCIUM 8.3* 08/22/2012 1043   CALCIUM 7.9* 03/10/2012 1804   ALKPHOS 100 08/22/2012 1043   ALKPHOS 67 03/10/2012 1804   AST 22 08/22/2012 1043   AST 11 03/10/2012 1804   ALT 28 08/22/2012 1043   ALT 15 03/10/2012 1804   BILITOT 0.37 08/22/2012 1043   BILITOT 0.3 03/10/2012 1804       RADIOGRAPHIC STUDIES: Ct Chest W Contrast  07/04/2012   *RADIOLOGY REPORT*  Clinical Data:  Restaging squamous cell carcinoma.  Lung cancer bone metastasis.  CT CHEST, ABDOMEN AND PELVIS WITH CONTRAST  Technique:  Multidetector CT imaging of the chest, abdomen and pelvis was performed following the standard protocol during bolus administration of intravenous contrast.  Contrast: OMNIPAQUE IOHEXOL 300 MG/ML  SOLN  Comparison:   None.  CT CHEST  Findings:  In the right upper lobe,  nodule has increased in size and density compared to prior measuring 30 x 30 mm (image 15) compared to 26 x 22 mm on prior.  The most significant increase in size is noted in the coronal projection with  the lesion measures 32 mm compared to approximately 21 mm on prior.  No additional pulmonary nodules are present.  Interval enlargement of left axillary lymph node measuring 11 mm (image 16) increased from 6 mm on prior.  No supraclavicular lymphadenopathy.  No mediastinal lymphadenopathy.  No pericardial fluid.  Esophagus is normal.  IMPRESSION: 1.  Interval recurrence of lung cancer with enlarged right upper lobe pulmonary nodule which now qualifies as a  pulmonary mass. 2.  Interval enlargement of left axillary lymph node is most concerning for axillary nodal metastasis.  CT ABDOMEN AND PELVIS  Findings:  No focal hepatic lesion.  Gallbladder, pancreas, spleen, adrenal glands, kidneys are unchanged.  There are multiple mixed density cysts within left right kidney which are unchanged from comparison exam.  There is small nodule in the lateral aspect of the right pararenal space measuring 6 mm (image 59) which is unchanged from prior.  The stomach, small bowel, colon normal.  Abdominal aorta is normal caliber.  No retroperitoneal lymphadenopathy.  Small retrocrural nodes are similar to prior.  Again demonstrated are lytic lesions within the pelvis.    For example 3.1 cm lesion in the posterior right iliac bone is slightly increased from 2.7 cm on prior.  There is a large lesion in the right sacrum measures 5.5 cm compared to 4.3 on prior.  Lesion at L3 anteriorly measures 18 mm increased from the 14 mm on prior. Lesion within the right scapula right lateral rib are noted.  The right rib lesion is slightly increased.  No new lesions are identified.  IMPRESSION:  1. No evidence metastatic disease of soft tissues of the abdomen or pelvis. 2.  Small retroperitoneal nodule along the left pararenal space is unchanged. This was not hypermetabolic on comparison PET CT scan.  3.  Lytic lesions within the pelvis and spine and ribs are mildly increased in size compared to prior.  No new lesions are identified.   Original  Report Authenticated By: Genevive Bi, M.D.   Ct Abdomen Pelvis W Contrast  07/04/2012   *RADIOLOGY REPORT*  Clinical Data:  Restaging squamous cell carcinoma.  Lung cancer bone metastasis.  CT CHEST, ABDOMEN AND PELVIS WITH CONTRAST  Technique:  Multidetector CT imaging of the chest, abdomen and pelvis was performed following the standard protocol during bolus administration of intravenous contrast.  Contrast: OMNIPAQUE IOHEXOL 300 MG/ML  SOLN  Comparison:   None.  CT CHEST  Findings:  In the right upper lobe,  nodule has increased in size and density compared to prior measuring 30 x 30 mm (image 15) compared to 26 x 22 mm on prior.  The most significant increase in size is noted in the coronal projection with the lesion measures 32 mm compared to approximately 21 mm on prior.  No additional pulmonary nodules are present.  Interval enlargement of left axillary lymph node measuring 11 mm (image 16) increased from 6 mm on prior.  No supraclavicular lymphadenopathy.  No mediastinal lymphadenopathy.  No pericardial fluid.  Esophagus is normal.  IMPRESSION: 1.  Interval recurrence of lung cancer with enlarged right upper lobe pulmonary nodule which now qualifies as a  pulmonary mass. 2.  Interval enlargement of left axillary lymph node is most concerning for axillary nodal metastasis.  CT ABDOMEN AND PELVIS  Findings:  No focal hepatic lesion.  Gallbladder, pancreas, spleen, adrenal glands, kidneys are unchanged.  There are multiple mixed density cysts within left right kidney which are unchanged from comparison exam.  There is small nodule in the lateral aspect of the right pararenal space measuring 6 mm (image 59) which is unchanged from prior.  The stomach, small bowel, colon normal.  Abdominal aorta is normal caliber.  No retroperitoneal lymphadenopathy.  Small retrocrural nodes are similar to prior.  Again demonstrated are lytic lesions within the pelvis.    For example 3.1 cm lesion in the posterior  right iliac bone is slightly increased from 2.7 cm on prior.  There is a large lesion in the right sacrum measures 5.5 cm compared to 4.3 on prior.  Lesion at L3 anteriorly measures 18 mm increased from the 14 mm on prior. Lesion within the right scapula right lateral rib are noted.  The right rib lesion is slightly increased.  No new lesions are identified.  IMPRESSION:  1. No evidence metastatic disease of soft tissues of the abdomen or pelvis. 2.  Small retroperitoneal nodule along the left pararenal space is unchanged. This was not hypermetabolic on comparison PET CT scan.  3.  Lytic lesions within the pelvis and spine and ribs are mildly increased in size compared to prior.  No new lesions are identified.   Original Report Authenticated By: Genevive Bi, M.D.    ASSESSMENT: This is a very pleasant 79 years old white male with metastatic non-small cell lung cancer with recent evidence for disease progression. The patient is here today to continue second line chemotherapy with single agent gemcitabine.   PLAN: Patient discussed with Dr. Arbutus Ped. He will proceed with day 8 of cycle 2  of his second line chemotherapy with single agent gemcitabine. He will continue with achieve 120 mg subcutaneously every 4 weeks for bone metastasis. He will complete his palliative radiotherapy for his painful bony lesions under the care of Dr. Basilio Cairo as scheduled. He'll followup with Dr. Arbutus Ped in 2 weeks prior to his next scheduled cycle of chemotherapy for another symptom management visit with a repeat CBC differential and C. met. I recommended for the patient to proceed with treatment as planned.  Marshell Dilauro E, PA-C   He was advised to call immediately if he has any concerning symptoms in the interval.  All questions were answered. The patient knows to call the clinic with any problems, questions or concerns. We can certainly see the patient much sooner if necessary.

## 2012-08-24 ENCOUNTER — Ambulatory Visit
Admission: RE | Admit: 2012-08-24 | Discharge: 2012-08-24 | Disposition: A | Discharge status: Home / Self Care | Payer: Medicare Other | Source: Ambulatory Visit | Attending: Radiation Oncology | Admitting: Radiation Oncology

## 2012-08-25 ENCOUNTER — Ambulatory Visit
Admission: RE | Admit: 2012-08-25 | Discharge: 2012-08-25 | Disposition: A | Discharge status: Home / Self Care | Payer: Medicare Other | Source: Ambulatory Visit | Attending: Radiation Oncology | Admitting: Radiation Oncology

## 2012-08-26 ENCOUNTER — Ambulatory Visit: Payer: Medicare Other

## 2012-08-29 ENCOUNTER — Ambulatory Visit: Payer: Medicare Other

## 2012-08-29 ENCOUNTER — Ambulatory Visit: Admission: RE | Admit: 2012-08-29 | Payer: Medicare Other | Source: Ambulatory Visit

## 2012-08-29 ENCOUNTER — Other Ambulatory Visit (HOSPITAL_BASED_OUTPATIENT_CLINIC_OR_DEPARTMENT_OTHER): Payer: Medicare Other | Admitting: Lab

## 2012-08-29 ENCOUNTER — Ambulatory Visit (HOSPITAL_BASED_OUTPATIENT_CLINIC_OR_DEPARTMENT_OTHER): Payer: Medicare Other

## 2012-08-29 ENCOUNTER — Other Ambulatory Visit: Payer: Medicare Other | Admitting: Lab

## 2012-08-29 VITALS — BP 152/68 | HR 78 | Temp 97.3°F

## 2012-08-29 DIAGNOSIS — C7952 Secondary malignant neoplasm of bone marrow: Secondary | ICD-10-CM

## 2012-08-29 DIAGNOSIS — C3410 Malignant neoplasm of upper lobe, unspecified bronchus or lung: Secondary | ICD-10-CM

## 2012-08-29 DIAGNOSIS — C7951 Secondary malignant neoplasm of bone: Secondary | ICD-10-CM

## 2012-08-29 LAB — CBC WITH DIFFERENTIAL/PLATELET
Basophils Absolute: 0 10*3/uL (ref 0.0–0.1)
EOS%: 0.2 % (ref 0.0–7.0)
HCT: 27.7 % — ABNORMAL LOW (ref 38.4–49.9)
HGB: 9.1 g/dL — ABNORMAL LOW (ref 13.0–17.1)
MCH: 32.3 pg (ref 27.2–33.4)
MCV: 98.2 fL — ABNORMAL HIGH (ref 79.3–98.0)
MONO%: 5.6 % (ref 0.0–14.0)
NEUT%: 88.1 % — ABNORMAL HIGH (ref 39.0–75.0)

## 2012-08-29 LAB — COMPREHENSIVE METABOLIC PANEL (CC13)
Albumin: 3.3 g/dL — ABNORMAL LOW (ref 3.5–5.0)
Alkaline Phosphatase: 111 U/L (ref 40–150)
BUN: 20.7 mg/dL (ref 7.0–26.0)
CO2: 23 mEq/L (ref 22–29)
Glucose: 117 mg/dl — ABNORMAL HIGH (ref 70–99)
Potassium: 4 mEq/L (ref 3.5–5.1)
Total Bilirubin: 0.36 mg/dL (ref 0.20–1.20)

## 2012-08-29 MED ORDER — DENOSUMAB 120 MG/1.7ML ~~LOC~~ SOLN
120.0000 mg | Freq: Once | SUBCUTANEOUS | Status: AC
  Administered 2012-08-29: 120 mg via SUBCUTANEOUS
  Filled 2012-08-29: qty 1.7

## 2012-08-30 ENCOUNTER — Ambulatory Visit
Admission: RE | Admit: 2012-08-30 | Discharge: 2012-08-30 | Disposition: A | Discharge status: Home / Self Care | Payer: Medicare Other | Source: Ambulatory Visit | Attending: Radiation Oncology | Admitting: Radiation Oncology

## 2012-08-30 ENCOUNTER — Other Ambulatory Visit: Payer: Medicare Other

## 2012-08-31 ENCOUNTER — Ambulatory Visit: Payer: Medicare Other

## 2012-08-31 ENCOUNTER — Ambulatory Visit: Admission: RE | Admit: 2012-08-31 | Payer: Medicare Other | Source: Ambulatory Visit

## 2012-09-01 ENCOUNTER — Ambulatory Visit
Admission: RE | Admit: 2012-09-01 | Discharge: 2012-09-01 | Disposition: A | Discharge status: Home / Self Care | Payer: Medicare Other | Source: Ambulatory Visit | Attending: Radiation Oncology | Admitting: Radiation Oncology

## 2012-09-01 ENCOUNTER — Encounter: Payer: Self-pay | Admitting: Radiation Oncology

## 2012-09-01 VITALS — BP 170/73 | HR 78 | Temp 97.4°F | Ht 67.0 in | Wt 151.3 lb

## 2012-09-01 DIAGNOSIS — C7951 Secondary malignant neoplasm of bone: Secondary | ICD-10-CM

## 2012-09-01 NOTE — Progress Notes (Signed)
Joel Gibson has received 12 fractions to his ribs and 13 to pelvis.  He states that the pain in his Left ribs is 90% better and the pain in his right hip is 65% better.

## 2012-09-01 NOTE — Progress Notes (Signed)
Weekly Management Note:  Site: Pelvis Current Dose:  2600  cGy Projected Dose: 3000  cGy  Narrative: The patient is seen today for routine under treatment assessment. CBCT/MVCT images/port films were reviewed. The chart was reviewed.   He states that his left rib pain is "100% better" . His bilateral hip pain is also improved and he is pleased that he can now cross his legs. He states that this pain is 80% better  Physical Examination:  Filed Vitals:   09/01/12 0942  BP: 170/73  Pulse: 78  Temp: 97.4 F (36.3 C)  .  Weight: 151 lb 4.8 oz (68.629 kg). No change.  Impression: Tolerating radiation therapy well. Satisfactory progress. He has 2 more treatments .  Plan: Continue radiation therapy as planned.

## 2012-09-02 ENCOUNTER — Ambulatory Visit
Admission: RE | Admit: 2012-09-02 | Discharge: 2012-09-02 | Disposition: A | Discharge status: Home / Self Care | Payer: Medicare Other | Source: Ambulatory Visit | Attending: Radiation Oncology | Admitting: Radiation Oncology

## 2012-09-03 ENCOUNTER — Ambulatory Visit: Payer: Medicare Other

## 2012-09-03 ENCOUNTER — Encounter: Payer: Self-pay | Admitting: Radiation Oncology

## 2012-09-03 ENCOUNTER — Ambulatory Visit
Admission: RE | Admit: 2012-09-03 | Discharge: 2012-09-03 | Disposition: A | Discharge status: Home / Self Care | Payer: Medicare Other | Source: Ambulatory Visit | Attending: Radiation Oncology | Admitting: Radiation Oncology

## 2012-09-05 ENCOUNTER — Telehealth: Payer: Self-pay | Admitting: Internal Medicine

## 2012-09-05 ENCOUNTER — Ambulatory Visit (HOSPITAL_BASED_OUTPATIENT_CLINIC_OR_DEPARTMENT_OTHER): Payer: Medicare Other | Admitting: Internal Medicine

## 2012-09-05 ENCOUNTER — Other Ambulatory Visit (HOSPITAL_BASED_OUTPATIENT_CLINIC_OR_DEPARTMENT_OTHER): Payer: Medicare Other

## 2012-09-05 ENCOUNTER — Ambulatory Visit: Payer: Medicare Other

## 2012-09-05 ENCOUNTER — Encounter: Payer: Self-pay | Admitting: Internal Medicine

## 2012-09-05 DIAGNOSIS — C3490 Malignant neoplasm of unspecified part of unspecified bronchus or lung: Secondary | ICD-10-CM

## 2012-09-05 LAB — CBC WITH DIFFERENTIAL/PLATELET
BASO%: 0.5 % (ref 0.0–2.0)
EOS%: 1 % (ref 0.0–7.0)
HGB: 9 g/dL — ABNORMAL LOW (ref 13.0–17.1)
MCH: 32.3 pg (ref 27.2–33.4)
MCHC: 32.3 g/dL (ref 32.0–36.0)
MONO%: 12.5 % (ref 0.0–14.0)
RBC: 2.79 10*6/uL — ABNORMAL LOW (ref 4.20–5.82)
RDW: 15.2 % — ABNORMAL HIGH (ref 11.0–14.6)
lymph#: 1.1 10*3/uL (ref 0.9–3.3)
nRBC: 1 % — ABNORMAL HIGH (ref 0–0)

## 2012-09-05 NOTE — Progress Notes (Signed)
[facility] [phone]   Fax:([phone]  OFFICE PROGRESS NOTE  Joel Sauer, MD [address] [facility], [place]. 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   PRIOR THERAPY:  1) Status post palliative radiotherapy to the right hip and sacrum under the care of Dr. Basilio Cairo.  2) Systemic chemotherapy with carboplatin for AUC of 6 on day 1 and Abraxane 100 mg/M2 on days 1, 8 and 15 every 3 weeks. The patient is status post 5 cycles.   CURRENT THERAPY:  1) Gemcitabine 1000 mg/M2 on days 1 and 8 every 3 weeks. Status post 1 cycle as well as day 1 of cycle 2  2) Xgeva 120 mg subcutaneously every 4 weeks. Status post fiirst dose given on 08/02/2012.  INTERVAL HISTORY: Joel Gibson 79 y.o. male returns to the clinic today for followup visit accompanied his wife. The patient is feeling fine today with no specific complaints except for mild peripheral neuropathy started few days ago. He also has some increased frequency started 2 days ago but it is getting better today. He is tolerating his systemic chemotherapy with gemcitabine fairly well with no significant adverse effects. He denied having any fever or chills. He has no chest pain, shortness of breath, cough or hemoptysis. The patient denied having any significant weight loss or night sweats.  MEDICAL HISTORY: Past Medical History  Diagnosis Date  . Hyperlipidemia   . Hypertension   . Aortic stenosis     mild to moderate  . Gastritis   . COPD (chronic obstructive pulmonary disease)   . Bronchitis   . Hepatitis     possible  . Hx of cataract surgery     lt eye  . Tobacco abuse   . Lung cancer 02/02/2012  . Shortness of breath     with activity  . Retinal vein occlusion 1990  . GERD (gastroesophageal reflux disease)   . Hemorrhoid   . Aortic stenosis   .  Hypertension   . Dyslipidemia   . Glaucoma   . Right rib fracture      Pathological Fracture / 4 weeks ago  . Lung mass     Right Upper Lobe - Lobular Mass  . Bone metastases     Widespread/ Lytic Lesion Left 7t Rib, Left Iliac Pelvis, ribs and scapula  . SOB (shortness of breath)     Mild  . Cough   . Stroke 2002    Right slight difference in vision  . S/P radiation therapy 02/22/12 - 03/07/12    Pelvis/Sacrum/30 Gray/10 Fractions and Left Scapula/Rib/ 8 Gray/1 Fraction   . On antineoplastic chemotherapy      Carboplatin/ Abraxane    ALLERGIES:  has No Known Allergies.  MEDICATIONS:  Current Outpatient Prescriptions  Medication Sig Dispense Refill  . albuterol (PROVENTIL HFA;VENTOLIN HFA) 108 (90 BASE) MCG/ACT inhaler Inhale 2 puffs into the lungs every 4 (four) hours as needed.      Marland Kitchen aspirin EC 81 MG tablet Take 81 mg by mouth every other day. Takes along with Aggrenox      . Aspirin-Dipyridamole (AGGRENOX PO) Take 25-200 mg by mouth 2 (two) times daily. Takes 2 tabs one day alt. with 1 tab next day,etc.      . betaxolol (BETOPTIC-S) 0.25 % ophthalmic suspension Place 1 drop into both eyes daily. One drop in the left  eye in the am....one drop in the right eye in the am and in the pm.      . Calcium Carbonate (CALCIUM 600 PO) Take 600 mg by mouth 2 (two) times daily.      . irbesartan (AVAPRO) 300 MG tablet Take 300 mg by mouth daily.       Marland Kitchen lidocaine-prilocaine (EMLA) cream Apply 1 application topically as needed.      . loratadine (CLARITIN) 10 MG tablet Take 10 mg by mouth daily.      Marland Kitchen morphine (MS CONTIN) 15 MG 12 hr tablet Take 1 tablet (15 mg total) by mouth 2 (two) times daily.    0  . Multiple Vitamin (MULTIVITAMIN PO) Take 1 tablet by mouth daily.        . niacin (NIASPAN) 500 MG CR tablet Take 500 mg by mouth at bedtime.       . Nutritional Supplements (EQUATE PO) Take by mouth. Daily      . omeprazole (PRILOSEC) 20 MG capsule Take 20 mg by mouth daily. Per Dr. Virgel Manifold       . oxyCODONE (OXY IR/ROXICODONE) 5 MG immediate release tablet Take 1 tablet (5 mg total) by mouth every 4 (four) hours as needed for pain.  45 tablet  0  . prochlorperazine (COMPAZINE) 10 MG tablet Take 10 mg by mouth every 6 (six) hours as needed. For nausea      . rosuvastatin (CRESTOR) 40 MG tablet Take 20 mg by mouth daily.       . temazepam (RESTORIL) 15 MG capsule Take 1 capsule (15 mg total) by mouth at bedtime as needed for sleep.  30 capsule  0  . tiotropium (SPIRIVA) 18 MCG inhalation capsule Place 18 mcg into inhaler and inhale daily.         No current facility-administered medications for this visit.    SURGICAL HISTORY:  Past Surgical History  Procedure Laterality Date  . Vasectomy    . Tonsillectomy    . Leg surgery      rt for nerve impingement  . Collarbone fracture      1977  . Tonsillectomy    . Eye surgery      catarct bil  . Portacath placement  02/08/2012    Procedure: INSERTION PORT-A-CATH;  Surgeon: [name], MD;  Location: [facility] OR;  Service: Thoracic;  Laterality: Right;  . Ct guided core biopsy  01/26/12    Left Lytic Lesion - Metastatic Squamous Cell Carcinoma  . Skin biopsy  02/09/12    Right Upper Back, shave : Melanoma In Situ Arising  From a Dysplastic Nevus    REVIEW OF SYSTEMS:  A comprehensive review of systems was negative except for: Constitutional: positive for fatigue Genitourinary: positive for frequency Neurological: positive for paresthesia   PHYSICAL EXAMINATION: General appearance: alert, cooperative and no distress Head: Normocephalic, without obvious abnormality, atraumatic Neck: no adenopathy Lymph nodes: Cervical, supraclavicular, and axillary nodes normal. Resp: clear to auscultation bilaterally Cardio: regular rate and rhythm, S1, S2 normal, no murmur, click, rub or gallop GI: soft, non-tender; bowel sounds normal; no masses,  no organomegaly Extremities: extremities normal, atraumatic, no cyanosis or edema Neurologic:  Alert and oriented X 3, normal strength and tone. Normal symmetric reflexes. Normal coordination and gait  ECOG PERFORMANCE STATUS: 1 - Symptomatic but completely ambulatory  Blood pressure 147/78, pulse 93, temperature 97 F (36.1 C), temperature source Oral, resp. rate 18, height 5\' 7"  (1.702 m), weight 150 lb 12.8 oz (  68.402 kg).  LABORATORY DATA: Lab Results  Component Value Date   WBC 8.9 09/05/2012   HGB 9.0* 09/05/2012   HCT 27.9* 09/05/2012   MCV 100.0* 09/05/2012   PLT 85* 09/05/2012      Chemistry      Component Value Date/Time   NA 140 08/29/2012 1100   NA 137 03/10/2012 1804   K 4.0 08/29/2012 1100   K 3.7 03/10/2012 1804   CL 108* 08/29/2012 1100   CL 103 03/10/2012 1804   CO2 23 08/29/2012 1100   CO2 26 03/10/2012 1804   BUN 20.7 08/29/2012 1100   BUN 25* 03/10/2012 1804   CREATININE 1.2 08/29/2012 1100   CREATININE 1.04 03/10/2012 1804      Component Value Date/Time   CALCIUM 8.7 08/29/2012 1100   CALCIUM 7.9* 03/10/2012 1804   ALKPHOS 111 08/29/2012 1100   ALKPHOS 67 03/10/2012 1804   AST 15 08/29/2012 1100   AST 11 03/10/2012 1804   ALT 19 08/29/2012 1100   ALT 15 03/10/2012 1804   BILITOT 0.36 08/29/2012 1100   BILITOT 0.3 03/10/2012 1804       RADIOGRAPHIC STUDIES: No results found.  ASSESSMENT AND PLAN: This is a very pleasant 79 years old white male with history of metastatic non-small cell lung cancer, squamous cell carcinoma currently on treatment with single agent gemcitabine status post 2 cycles.  The patient continues to have thrombocytopenia secondary to his recent chemotherapy. I recommended for him to delay the start of cycle #3 until next week. For the frequency I gave the patient the option of doing a urinalysis today but he would like to wait since his symptoms are better. I would see him back for followup visit in 2 weeks for evaluation and management any adverse effect of his treatment. He was advised to call immediately if he has any concerning symptoms in the  interval. All questions were answered. The patient knows to call the clinic with any problems, questions or concerns. We can certainly see the patient much sooner if necessary.  I spent 15 minutes counseling the patient face to face. The total time spent in the appointment was 25 minutes.

## 2012-09-05 NOTE — Telephone Encounter (Signed)
gv and printed appt sched and avs for pt....MW added tx   °

## 2012-09-05 NOTE — Patient Instructions (Signed)
We will delay the start of cycle #3 until next week because of low platelets. Followup visit in 2 weeks

## 2012-09-06 ENCOUNTER — Other Ambulatory Visit: Payer: Self-pay | Admitting: *Deleted

## 2012-09-06 ENCOUNTER — Ambulatory Visit: Payer: Medicare Other

## 2012-09-07 NOTE — Progress Notes (Signed)
  Radiation Oncology         [phone] ________________________________  Name: UZIEL COVAULT MRN: [id number]  Date: 09/03/2012  DOB: Aug 13, 1931  End of Treatment Note  Diagnosis:   Bone metastases, metastatic squamous cell carcinoma, consistent with lung primary    Indication for treatment:  palliative       Radiation treatment dates:   08/11/2012-09/03/2012  Site/dose:    1) Pelvis/ sacrum RETREAT / 30 Gy in 15 fractions 2) left Rib RETREAT / 30 Gy in 12 fractions  Beams/energy:    1) IMRT - VMAT / 6 MV photons 2) En face / 18 MeV electrons  Narrative: The patient tolerated radiation treatment relatively well.  In his last week of therapy, he reported that his left rib pain is "100% better" . His bilateral hip pain was also improved and he is pleased that he can now cross his legs. He stated that this pain is 80% better.  Plan: The patient has completed radiation treatment. The patient will return to radiation oncology clinic for routine followup in one month. I advised them to call or return sooner if they have any questions or concerns related to their recovery or treatment.  -----------------------------------  Lonie Peak, MD

## 2012-09-12 ENCOUNTER — Ambulatory Visit (HOSPITAL_BASED_OUTPATIENT_CLINIC_OR_DEPARTMENT_OTHER): Payer: Medicare Other

## 2012-09-12 ENCOUNTER — Other Ambulatory Visit (HOSPITAL_BASED_OUTPATIENT_CLINIC_OR_DEPARTMENT_OTHER): Payer: Medicare Other | Admitting: Lab

## 2012-09-12 ENCOUNTER — Other Ambulatory Visit: Payer: Medicare Other | Admitting: Lab

## 2012-09-12 DIAGNOSIS — C7951 Secondary malignant neoplasm of bone: Secondary | ICD-10-CM

## 2012-09-12 DIAGNOSIS — C3410 Malignant neoplasm of upper lobe, unspecified bronchus or lung: Secondary | ICD-10-CM

## 2012-09-12 DIAGNOSIS — Z5111 Encounter for antineoplastic chemotherapy: Secondary | ICD-10-CM

## 2012-09-12 LAB — CBC WITH DIFFERENTIAL/PLATELET
BASO%: 0.3 % (ref 0.0–2.0)
Eosinophils Absolute: 0.2 10*3/uL (ref 0.0–0.5)
MCHC: 31.7 g/dL — ABNORMAL LOW (ref 32.0–36.0)
MONO#: 1.5 10*3/uL — ABNORMAL HIGH (ref 0.1–0.9)
NEUT#: 4.9 10*3/uL (ref 1.5–6.5)
RBC: 2.83 10*6/uL — ABNORMAL LOW (ref 4.20–5.82)
RDW: 18 % — ABNORMAL HIGH (ref 11.0–14.6)
WBC: 7.1 10*3/uL (ref 4.0–10.3)
nRBC: 0 % (ref 0–0)

## 2012-09-12 LAB — COMPREHENSIVE METABOLIC PANEL (CC13)
ALT: 13 U/L (ref 0–55)
AST: 15 U/L (ref 5–34)
CO2: 23 mEq/L (ref 22–29)
Calcium: 8.2 mg/dL — ABNORMAL LOW (ref 8.4–10.4)
Chloride: 111 mEq/L — ABNORMAL HIGH (ref 98–109)
Sodium: 142 mEq/L (ref 136–145)
Total Bilirubin: 0.35 mg/dL (ref 0.20–1.20)
Total Protein: 6.4 g/dL (ref 6.4–8.3)

## 2012-09-12 MED ORDER — SODIUM CHLORIDE 0.9 % IV SOLN
1000.0000 mg/m2 | Freq: Once | INTRAVENOUS | Status: AC
  Administered 2012-09-12: 1786 mg via INTRAVENOUS
  Filled 2012-09-12: qty 47

## 2012-09-12 MED ORDER — SODIUM CHLORIDE 0.9 % IJ SOLN
10.0000 mL | INTRAMUSCULAR | Status: DC | PRN
  Administered 2012-09-12: 10 mL
  Filled 2012-09-12: qty 10

## 2012-09-12 MED ORDER — PROCHLORPERAZINE MALEATE 10 MG PO TABS: 10.0000 mg | ORAL_TABLET | Freq: Once | ORAL | Status: DC

## 2012-09-12 MED ORDER — PROCHLORPERAZINE EDISYLATE 5 MG/ML IJ SOLN
10.0000 mg | Freq: Once | INTRAMUSCULAR | Status: AC
  Administered 2012-09-12: 10 mg via INTRAVENOUS

## 2012-09-12 MED ORDER — HEPARIN SOD (PORK) LOCK FLUSH 100 UNIT/ML IV SOLN
500.0000 [IU] | Freq: Once | INTRAVENOUS | Status: AC | PRN
  Administered 2012-09-12: 500 [IU]
  Filled 2012-09-12: qty 5

## 2012-09-12 MED ORDER — SODIUM CHLORIDE 0.9 % IV SOLN
Freq: Once | INTRAVENOUS | Status: AC
  Administered 2012-09-12: 09:00:00 via INTRAVENOUS

## 2012-09-12 NOTE — Patient Instructions (Signed)
[facility] Discharge Instructions for Patients Receiving Chemotherapy  Today you received the following chemotherapy agents :  Gemzar.  To help prevent nausea and vomiting after your treatment, we encourage you to take your nausea medication as instructed by your physician.Gemcitabine injection What is this medicine? GEMCITABINE (jem SIT a been) is a chemotherapy drug. This medicine is used to treat many types of cancer like breast cancer, lung cancer, pancreatic cancer, and ovarian cancer. This medicine may be used for other purposes; ask your health care provider or pharmacist if you have questions. What should I tell my health care provider before I take this medicine? They need to know if you have any of these conditions: -blood disorders -infection -kidney disease -liver disease -recent or ongoing radiation therapy -an unusual or allergic reaction to gemcitabine, other chemotherapy, other medicines, foods, dyes, or preservatives -pregnant or trying to get pregnant -breast-feeding How should I use this medicine? This drug is given as an infusion into a vein. It is administered in a hospital or clinic by a specially trained health care professional. Talk to your pediatrician regarding the use of this medicine in children. Special care may be needed. Overdosage: If you think you have taken too much of this medicine contact a poison control center or emergency room at once. NOTE: This medicine is only for you. Do not share this medicine with others. What if I miss a dose? It is important not to miss your dose. Call your doctor or health care professional if you are unable to keep an appointment. What may interact with this medicine? -medicines to increase blood counts like filgrastim, pegfilgrastim, sargramostim -some other chemotherapy drugs like cisplatin -vaccines Talk to your doctor or health care professional before taking any of these  medicines: -acetaminophen -aspirin -ibuprofen -ketoprofen -naproxen This list may not describe all possible interactions. Give your health care provider a list of all the medicines, herbs, non-prescription drugs, or dietary supplements you use. Also tell them if you smoke, drink alcohol, or use illegal drugs. Some items may interact with your medicine. What should I watch for while using this medicine? Visit your doctor for checks on your progress. This drug may make you feel generally unwell. This is not uncommon, as chemotherapy can affect healthy cells as well as cancer cells. Report any side effects. Continue your course of treatment even though you feel ill unless your doctor tells you to stop. In some cases, you may be given additional medicines to help with side effects. Follow all directions for their use. Call your doctor or health care professional for advice if you get a fever, chills or sore throat, or other symptoms of a cold or flu. Do not treat yourself. This drug decreases your body's ability to fight infections. Try to avoid being around people who are sick. This medicine may increase your risk to bruise or bleed. Call your doctor or health care professional if you notice any unusual bleeding. Be careful brushing and flossing your teeth or using a toothpick because you may get an infection or bleed more easily. If you have any dental work done, tell your dentist you are receiving this medicine. Avoid taking products that contain aspirin, acetaminophen, ibuprofen, naproxen, or ketoprofen unless instructed by your doctor. These medicines may hide a fever. Women should inform their doctor if they wish to become pregnant or think they might be pregnant. There is a potential for serious side effects to an unborn child. Talk to your health care  professional or pharmacist for more information. Do not breast-feed an infant while taking this medicine. What side effects may I notice from  receiving this medicine? Side effects that you should report to your doctor or health care professional as soon as possible: -allergic reactions like skin rash, itching or hives, swelling of the face, lips, or tongue -low blood counts - this medicine may decrease the number of white blood cells, red blood cells and platelets. You may be at increased risk for infections and bleeding. -signs of infection - fever or chills, cough, sore throat, pain or difficulty passing urine -signs of decreased platelets or bleeding - bruising, pinpoint red spots on the skin, black, tarry stools, blood in the urine -signs of decreased red blood cells - unusually weak or tired, fainting spells, lightheadedness -breathing problems -chest pain -mouth sores -nausea and vomiting -pain, swelling, redness at site where injected -pain, tingling, numbness in the hands or feet -stomach pain -swelling of ankles, feet, hands -unusual bleeding Side effects that usually do not require medical attention (report to your doctor or health care professional if they continue or are bothersome): -constipation -diarrhea -hair loss -loss of appetite -stomach upset This list may not describe all possible side effects. Call your doctor for medical advice about side effects. You may report side effects to FDA at 1-800-FDA-1088. Where should I keep my medicine? This drug is given in a hospital or clinic and will not be stored at home. NOTE: This sheet is a summary. It may not cover all possible information. If you have questions about this medicine, talk to your doctor, pharmacist, or health care provider.  2013, [employer]vier/Gold Standard. (07/05/2007 6:45:54 PM)    If you develop nausea and vomiting that is not controlled by your nausea medication, call the clinic.   BELOW ARE SYMPTOMS THAT SHOULD BE REPORTED IMMEDIATELY:  *FEVER GREATER THAN 100.5 F  *CHILLS WITH OR WITHOUT FEVER  NAUSEA AND VOMITING THAT IS NOT CONTROLLED  WITH YOUR NAUSEA MEDICATION  *UNUSUAL SHORTNESS OF BREATH  *UNUSUAL BRUISING OR BLEEDING  TENDERNESS IN MOUTH AND THROAT WITH OR WITHOUT PRESENCE OF ULCERS  *URINARY PROBLEMS  *BOWEL PROBLEMS  UNUSUAL RASH Items with * indicate a potential emergency and should be followed up as soon as possible.  Feel free to call the clinic you have any questions or concerns. The clinic phone number is [phone].

## 2012-09-13 ENCOUNTER — Ambulatory Visit: Payer: Medicare Other

## 2012-09-19 ENCOUNTER — Ambulatory Visit (HOSPITAL_BASED_OUTPATIENT_CLINIC_OR_DEPARTMENT_OTHER): Payer: Medicare Other | Admitting: Internal Medicine

## 2012-09-19 ENCOUNTER — Other Ambulatory Visit (HOSPITAL_BASED_OUTPATIENT_CLINIC_OR_DEPARTMENT_OTHER): Payer: Medicare Other | Admitting: Lab

## 2012-09-19 ENCOUNTER — Ambulatory Visit: Payer: Medicare Other

## 2012-09-19 ENCOUNTER — Other Ambulatory Visit: Payer: Medicare Other | Admitting: Lab

## 2012-09-19 ENCOUNTER — Other Ambulatory Visit: Payer: Medicare Other

## 2012-09-19 ENCOUNTER — Encounter: Payer: Self-pay | Admitting: Internal Medicine

## 2012-09-19 ENCOUNTER — Ambulatory Visit: Payer: Medicare Other | Admitting: Internal Medicine

## 2012-09-19 ENCOUNTER — Telehealth: Payer: Self-pay | Admitting: Internal Medicine

## 2012-09-19 ENCOUNTER — Telehealth: Payer: Self-pay | Admitting: *Deleted

## 2012-09-19 DIAGNOSIS — C3490 Malignant neoplasm of unspecified part of unspecified bronchus or lung: Secondary | ICD-10-CM

## 2012-09-19 LAB — CBC WITH DIFFERENTIAL/PLATELET
BASO%: 0.8 % (ref 0.0–2.0)
Basophils Absolute: 0 10*3/uL (ref 0.0–0.1)
Eosinophils Absolute: 0 10*3/uL (ref 0.0–0.5)
HCT: 26.3 % — ABNORMAL LOW (ref 38.4–49.9)
HGB: 8.5 g/dL — ABNORMAL LOW (ref 13.0–17.1)
MCHC: 32.3 g/dL (ref 32.0–36.0)
MONO#: 0.2 10*3/uL (ref 0.1–0.9)
NEUT#: 0.6 10*3/uL — ABNORMAL LOW (ref 1.5–6.5)
NEUT%: 49.7 % (ref 39.0–75.0)
WBC: 1.2 10*3/uL — ABNORMAL LOW (ref 4.0–10.3)
lymph#: 0.4 10*3/uL — ABNORMAL LOW (ref 0.9–3.3)

## 2012-09-19 NOTE — Patient Instructions (Signed)
We will hold chemotherapy today secondary to neutropenia. Followup visit in 2 weeks with repeat CT scan of the chest, abdomen and pelvis

## 2012-09-19 NOTE — Telephone Encounter (Signed)
Per staff message and POF I have scheduled appts.  JMW  

## 2012-09-19 NOTE — Progress Notes (Signed)
[facility] [phone]   Fax:([phone]  OFFICE PROGRESS NOTE  [name], MD [address] [facility], [place]. [place] [address]  DIAGNOSIS: Stage IV non-small cell lung cancer, squamous cell carcinoma with significant bone metastases diagnosed in November of 2013.   PRIOR THERAPY:  1) Status post palliative radiotherapy to the right hip and sacrum under the care of Dr. Basilio Cairo.  2) Systemic chemotherapy with carboplatin for AUC of 6 on day 1 and Abraxane 100 mg/M2 on days 1, 8 and 15 every 3 weeks. The patient is status post 5 cycles.   CURRENT THERAPY:  1) Gemcitabine 1000 mg/M2 on days 1 and 8 every 3 weeks. Status post 1 cycle as well as day 1 of cycle 2  2) Xgeva 120 mg subcutaneously every 4 weeks. Status post fiirst dose given on 08/02/2012.   INTERVAL HISTORY: Joel Gibson 79 y.o. male returns to the clinic today for follow up visit accompanied his wife. The patient tolerated the last cycle of her systemic chemotherapy fairly well except for leukocytopenia and neutropenia. He denied having any significant nausea or vomiting. He denied having any chest pain, shortness breath, cough or hemoptysis. The patient has mild fatigue. He is here today to start day 8 of the third cycle of his chemotherapy.  MEDICAL HISTORY: Past Medical History  Diagnosis Date  . Hyperlipidemia   . Hypertension   . Aortic stenosis     mild to moderate  . Gastritis   . COPD (chronic obstructive pulmonary disease)   . Bronchitis   . Hepatitis     possible  . Hx of cataract surgery     lt eye  . Tobacco abuse   . Lung cancer 02/02/2012  . Shortness of breath     with activity  . Retinal vein occlusion 1990  . GERD (gastroesophageal reflux disease)   . Hemorrhoid   . Aortic stenosis   . Hypertension   . Dyslipidemia   . Glaucoma   . Right rib fracture      Pathological Fracture / 4 weeks ago  . Lung mass     Right Upper  Lobe - Lobular Mass  . Bone metastases     Widespread/ Lytic Lesion Left 7t Rib, Left Iliac Pelvis, ribs and scapula  . SOB (shortness of breath)     Mild  . Cough   . Stroke 2002    Right slight difference in vision  . S/P radiation therapy 02/22/12 - 03/07/12    Pelvis/Sacrum/30 Gray/10 Fractions and Left Scapula/Rib/ 8 Gray/1 Fraction   . On antineoplastic chemotherapy      Carboplatin/ Abraxane    ALLERGIES:  has No Known Allergies.  MEDICATIONS:  Current Outpatient Prescriptions  Medication Sig Dispense Refill  . albuterol (PROVENTIL HFA;VENTOLIN HFA) 108 (90 BASE) MCG/ACT inhaler Inhale 2 puffs into the lungs every 4 (four) hours as needed.      Marland Kitchen aspirin EC 81 MG tablet Take 81 mg by mouth every other day. Takes along with Aggrenox      . Aspirin-Dipyridamole (AGGRENOX PO) Take 25-200 mg by mouth 2 (two) times daily. Takes 2 tabs one day alt. with 1 tab next day,etc.      . betaxolol (BETOPTIC-S) 0.25 % ophthalmic suspension Place 1 drop into both eyes daily. One drop in the left eye in the am....one drop in the right eye in the am and in the pm.      .  Calcium Carbonate (CALCIUM 600 PO) Take 600 mg by mouth 2 (two) times daily.      . irbesartan (AVAPRO) 300 MG tablet Take 300 mg by mouth daily.       Marland Kitchen lidocaine-prilocaine (EMLA) cream Apply 1 application topically as needed.      . loratadine (CLARITIN) 10 MG tablet Take 10 mg by mouth daily.      Marland Kitchen morphine (MS CONTIN) 15 MG 12 hr tablet Take 1 tablet (15 mg total) by mouth 2 (two) times daily.    0  . Multiple Vitamin (MULTIVITAMIN PO) Take 1 tablet by mouth daily.        . niacin (NIASPAN) 500 MG CR tablet Take 500 mg by mouth at bedtime.       . Nutritional Supplements (EQUATE PO) Take by mouth. Daily      . omeprazole (PRILOSEC) 20 MG capsule Take 20 mg by mouth daily. Per Dr. Virgel Manifold      . oxyCODONE (OXY IR/ROXICODONE) 5 MG immediate release tablet Take 1 tablet (5 mg total) by mouth every 4 (four) hours as needed for  pain.  45 tablet  0  . prochlorperazine (COMPAZINE) 10 MG tablet Take 10 mg by mouth every 6 (six) hours as needed. For nausea      . rosuvastatin (CRESTOR) 40 MG tablet Take 20 mg by mouth daily.       . temazepam (RESTORIL) 15 MG capsule Take 1 capsule (15 mg total) by mouth at bedtime as needed for sleep.  30 capsule  0  . tiotropium (SPIRIVA) 18 MCG inhalation capsule Place 18 mcg into inhaler and inhale daily.         No current facility-administered medications for this visit.    SURGICAL HISTORY:  Past Surgical History  Procedure Laterality Date  . Vasectomy    . Tonsillectomy    . Leg surgery      rt for nerve impingement  . Collarbone fracture      1977  . Tonsillectomy    . Eye surgery      catarct bil  . Portacath placement  02/08/2012    Procedure: INSERTION PORT-A-CATH;  Surgeon: [name], MD;  Location: [facility] OR;  Service: Thoracic;  Laterality: Right;  . Ct guided core biopsy  01/26/12    Left Lytic Lesion - Metastatic Squamous Cell Carcinoma  . Skin biopsy  02/09/12    Right Upper Back, shave : Melanoma In Situ Arising  From a Dysplastic Nevus    REVIEW OF SYSTEMS:  A comprehensive review of systems was negative except for: Constitutional: positive for fatigue   PHYSICAL EXAMINATION: General appearance: alert, cooperative, fatigued and no distress Head: Normocephalic, without obvious abnormality, atraumatic Neck: no adenopathy Lymph nodes: Cervical, supraclavicular, and axillary nodes normal. Resp: clear to auscultation bilaterally Cardio: regular rate and rhythm, S1, S2 normal, no murmur, click, rub or gallop GI: soft, non-tender; bowel sounds normal; no masses,  no organomegaly Extremities: extremities normal, atraumatic, no cyanosis or edema  ECOG PERFORMANCE STATUS: 1 - Symptomatic but completely ambulatory  Blood pressure 169/56, pulse 77, temperature 97.4 F (36.3 C), temperature source Oral, resp. rate 18, height 5\' 7"  (1.702 m), weight 151 lb 12.8  oz (68.856 kg).  LABORATORY DATA: Lab Results  Component Value Date   WBC 1.2* 09/19/2012   HGB 8.5* 09/19/2012   HCT 26.3* 09/19/2012   MCV 100.8* 09/19/2012   PLT 87* 09/19/2012      Chemistry  Component Value Date/Time   NA 142 09/12/2012 0808   NA 137 03/10/2012 1804   K 3.5 09/12/2012 0808   K 3.7 03/10/2012 1804   CL 108* 08/29/2012 1100   CL 103 03/10/2012 1804   CO2 23 09/12/2012 0808   CO2 26 03/10/2012 1804   BUN 18.6 09/12/2012 0808   BUN 25* 03/10/2012 1804   CREATININE 1.2 09/12/2012 0808   CREATININE 1.04 03/10/2012 1804      Component Value Date/Time   CALCIUM 8.2* 09/12/2012 0808   CALCIUM 7.9* 03/10/2012 1804   ALKPHOS 90 09/12/2012 0808   ALKPHOS 67 03/10/2012 1804   AST 15 09/12/2012 0808   AST 11 03/10/2012 1804   ALT 13 09/12/2012 0808   ALT 15 03/10/2012 1804   BILITOT 0.35 09/12/2012 0808   BILITOT 0.3 03/10/2012 1804       RADIOGRAPHIC STUDIES: No results found.  ASSESSMENT AND PLAN: this is a very pleasant 79 years old white male with metastatic non-small cell lung cancer currently undergoing systemic chemotherapy with single agent gemcitabine status post 2 cycles and day 1 of the third cycle. He has Leukocytopenia and neutropenia today secondary to his chemotherapy. We will discontinue day 8 of the third cycle secondary to his neutropenia. I would see the patient back for follow up visit in 2 weeks with repeat CT scan of the chest, abdomen and pelvis for restaging of his disease. He was advised to call immediately if he has any concerning symptoms in the interval  All questions were answered. The patient knows to call the clinic with any problems, questions or concerns. We can certainly see the patient much sooner if necessary.

## 2012-09-19 NOTE — Telephone Encounter (Signed)
Gave pt appt for lab, Md and chemo for  July and August 2014

## 2012-09-26 ENCOUNTER — Other Ambulatory Visit: Payer: Medicare Other

## 2012-09-26 ENCOUNTER — Ambulatory Visit (HOSPITAL_BASED_OUTPATIENT_CLINIC_OR_DEPARTMENT_OTHER): Payer: Medicare Other

## 2012-09-26 ENCOUNTER — Other Ambulatory Visit (HOSPITAL_BASED_OUTPATIENT_CLINIC_OR_DEPARTMENT_OTHER): Payer: Medicare Other

## 2012-09-26 VITALS — BP 155/64 | HR 72 | Temp 98.2°F

## 2012-09-26 DIAGNOSIS — C7951 Secondary malignant neoplasm of bone: Secondary | ICD-10-CM

## 2012-09-26 DIAGNOSIS — C3410 Malignant neoplasm of upper lobe, unspecified bronchus or lung: Secondary | ICD-10-CM

## 2012-09-26 DIAGNOSIS — C7952 Secondary malignant neoplasm of bone marrow: Secondary | ICD-10-CM

## 2012-09-26 LAB — CBC WITH DIFFERENTIAL/PLATELET
Basophils Absolute: 0 10*3/uL (ref 0.0–0.1)
EOS%: 2.8 % (ref 0.0–7.0)
HCT: 30.6 % — ABNORMAL LOW (ref 38.4–49.9)
HGB: 10.3 g/dL — ABNORMAL LOW (ref 13.0–17.1)
MCH: 34.8 pg — ABNORMAL HIGH (ref 27.2–33.4)
NEUT%: 70.1 % (ref 39.0–75.0)
Platelets: 133 10*3/uL — ABNORMAL LOW (ref 140–400)
lymph#: 0.4 10*3/uL — ABNORMAL LOW (ref 0.9–3.3)

## 2012-09-26 LAB — COMPREHENSIVE METABOLIC PANEL (CC13)
AST: 19 U/L (ref 5–34)
BUN: 20.9 mg/dL (ref 7.0–26.0)
CO2: 24 mEq/L (ref 22–29)
Calcium: 8.8 mg/dL (ref 8.4–10.4)
Chloride: 109 mEq/L (ref 98–109)
Creatinine: 1.2 mg/dL (ref 0.7–1.3)

## 2012-09-26 MED ORDER — DENOSUMAB 120 MG/1.7ML ~~LOC~~ SOLN
120.0000 mg | Freq: Once | SUBCUTANEOUS | Status: AC
  Administered 2012-09-26: 120 mg via SUBCUTANEOUS
  Filled 2012-09-26: qty 1.7

## 2012-09-28 NOTE — Progress Notes (Signed)
IMRT Device Note  OUTPATIENT 08-11-12  I have approved one set of IMRT treatment devices. The code is [phone].  -----------------------------------  Lonie Peak, MD

## 2012-10-03 ENCOUNTER — Telehealth: Payer: Self-pay | Admitting: Internal Medicine

## 2012-10-03 ENCOUNTER — Other Ambulatory Visit (HOSPITAL_BASED_OUTPATIENT_CLINIC_OR_DEPARTMENT_OTHER): Payer: Medicare Other | Admitting: Lab

## 2012-10-03 ENCOUNTER — Ambulatory Visit: Payer: Medicare Other | Admitting: Physician Assistant

## 2012-10-03 ENCOUNTER — Ambulatory Visit (HOSPITAL_BASED_OUTPATIENT_CLINIC_OR_DEPARTMENT_OTHER): Payer: Medicare Other | Admitting: Physician Assistant

## 2012-10-03 ENCOUNTER — Other Ambulatory Visit: Payer: Medicare Other

## 2012-10-03 ENCOUNTER — Telehealth: Payer: Self-pay | Admitting: *Deleted

## 2012-10-03 ENCOUNTER — Ambulatory Visit (HOSPITAL_BASED_OUTPATIENT_CLINIC_OR_DEPARTMENT_OTHER): Payer: Medicare Other

## 2012-10-03 DIAGNOSIS — C801 Malignant (primary) neoplasm, unspecified: Secondary | ICD-10-CM

## 2012-10-03 DIAGNOSIS — C3490 Malignant neoplasm of unspecified part of unspecified bronchus or lung: Secondary | ICD-10-CM

## 2012-10-03 DIAGNOSIS — Z5111 Encounter for antineoplastic chemotherapy: Secondary | ICD-10-CM

## 2012-10-03 DIAGNOSIS — C7951 Secondary malignant neoplasm of bone: Secondary | ICD-10-CM

## 2012-10-03 LAB — CBC WITH DIFFERENTIAL/PLATELET
Basophils Absolute: 0 10*3/uL (ref 0.0–0.1)
Eosinophils Absolute: 0.2 10*3/uL (ref 0.0–0.5)
HCT: 33.7 % — ABNORMAL LOW (ref 38.4–49.9)
HGB: 10.7 g/dL — ABNORMAL LOW (ref 13.0–17.1)
MONO#: 0.8 10*3/uL (ref 0.1–0.9)
NEUT%: 73.8 % (ref 39.0–75.0)
WBC: 6.6 10*3/uL (ref 4.0–10.3)
lymph#: 0.8 10*3/uL — ABNORMAL LOW (ref 0.9–3.3)

## 2012-10-03 LAB — COMPREHENSIVE METABOLIC PANEL (CC13)
Albumin: 3.6 g/dL (ref 3.5–5.0)
BUN: 19.2 mg/dL (ref 7.0–26.0)
Calcium: 8.8 mg/dL (ref 8.4–10.4)
Chloride: 110 mEq/L — ABNORMAL HIGH (ref 98–109)
Glucose: 93 mg/dl (ref 70–140)
Potassium: 4.3 mEq/L (ref 3.5–5.1)

## 2012-10-03 MED ORDER — SODIUM CHLORIDE 0.9 % IV SOLN
Freq: Once | INTRAVENOUS | Status: AC
  Administered 2012-10-03: 13:00:00 via INTRAVENOUS

## 2012-10-03 MED ORDER — HEPARIN SOD (PORK) LOCK FLUSH 100 UNIT/ML IV SOLN
500.0000 [IU] | Freq: Once | INTRAVENOUS | Status: AC | PRN
  Administered 2012-10-03: 500 [IU]
  Filled 2012-10-03: qty 5

## 2012-10-03 MED ORDER — SODIUM CHLORIDE 0.9 % IV SOLN
1000.0000 mg/m2 | Freq: Once | INTRAVENOUS | Status: AC
  Administered 2012-10-03: 1862 mg via INTRAVENOUS
  Filled 2012-10-03: qty 49

## 2012-10-03 MED ORDER — TEMAZEPAM 15 MG PO CAPS: 15.0000 mg | ORAL_CAPSULE | Freq: Every evening | ORAL | Status: DC | PRN

## 2012-10-03 MED ORDER — SODIUM CHLORIDE 0.9 % IJ SOLN
10.0000 mL | INTRAMUSCULAR | Status: DC | PRN
  Administered 2012-10-03: 10 mL
  Filled 2012-10-03: qty 10

## 2012-10-03 MED ORDER — MORPHINE SULFATE ER 15 MG PO TBCR: 15.0000 mg | EXTENDED_RELEASE_TABLET | Freq: Two times a day (BID) | ORAL | Status: DC

## 2012-10-03 MED ORDER — PROCHLORPERAZINE MALEATE 10 MG PO TABS
10.0000 mg | ORAL_TABLET | Freq: Once | ORAL | Status: AC
  Administered 2012-10-03: 10 mg via ORAL

## 2012-10-03 NOTE — Telephone Encounter (Signed)
gv and printed appt sched and avs for pt...emailed MW to add tx... °

## 2012-10-03 NOTE — Patient Instructions (Addendum)
Continue labs as scheduled. You are being scheduled for a CT of your head to further evaluate your complaints of problems with your balance Follow up with Dr. Arbutus Ped in 3 weeks with restaging Ct scan of your chest, abdomen and pelvis to re-evaluate your disease

## 2012-10-03 NOTE — Patient Instructions (Addendum)
[facility] Discharge Instructions for Patients Receiving Chemotherapy  Today you received the following chemotherapy agents: Gemzar  To help prevent nausea and vomiting after your treatment, we encourage you to take your nausea medication as directed by your physician.    If you develop nausea and vomiting that is not controlled by your nausea medication, call the clinic.   BELOW ARE SYMPTOMS THAT SHOULD BE REPORTED IMMEDIATELY:  *FEVER GREATER THAN 100.5 F  *CHILLS WITH OR WITHOUT FEVER  NAUSEA AND VOMITING THAT IS NOT CONTROLLED WITH YOUR NAUSEA MEDICATION  *UNUSUAL SHORTNESS OF BREATH  *UNUSUAL BRUISING OR BLEEDING  TENDERNESS IN MOUTH AND THROAT WITH OR WITHOUT PRESENCE OF ULCERS  *URINARY PROBLEMS  *BOWEL PROBLEMS  UNUSUAL RASH Items with * indicate a potential emergency and should be followed up as soon as possible.  Feel free to call the clinic you have any questions or concerns. The clinic phone number is [phone].

## 2012-10-03 NOTE — Telephone Encounter (Signed)
Per staff message and POF I have scheduled appts.  JMW  

## 2012-10-06 ENCOUNTER — Encounter: Payer: Self-pay | Admitting: Radiation Oncology

## 2012-10-07 ENCOUNTER — Telehealth: Payer: Self-pay | Admitting: *Deleted

## 2012-10-07 ENCOUNTER — Other Ambulatory Visit: Payer: Self-pay | Admitting: Physician Assistant

## 2012-10-07 ENCOUNTER — Encounter: Payer: Self-pay | Admitting: Radiation Oncology

## 2012-10-07 ENCOUNTER — Other Ambulatory Visit: Payer: Self-pay | Admitting: Radiation Therapy

## 2012-10-07 ENCOUNTER — Ambulatory Visit (HOSPITAL_COMMUNITY)
Admission: RE | Admit: 2012-10-07 | Discharge: 2012-10-07 | Disposition: A | Discharge status: Home / Self Care | Payer: Medicare Other | Source: Ambulatory Visit | Attending: Physician Assistant | Admitting: Physician Assistant

## 2012-10-07 ENCOUNTER — Encounter: Payer: Self-pay | Admitting: Physician Assistant

## 2012-10-07 ENCOUNTER — Encounter (HOSPITAL_COMMUNITY): Payer: Self-pay

## 2012-10-07 ENCOUNTER — Ambulatory Visit
Admission: RE | Admit: 2012-10-07 | Discharge: 2012-10-07 | Disposition: A | Discharge status: Home / Self Care | Payer: Medicare Other | Source: Ambulatory Visit | Attending: Radiation Oncology | Admitting: Radiation Oncology

## 2012-10-07 VITALS — BP 172/90 | HR 68 | Temp 97.5°F | Ht 67.0 in | Wt 152.5 lb

## 2012-10-07 DIAGNOSIS — C7949 Secondary malignant neoplasm of other parts of nervous system: Secondary | ICD-10-CM

## 2012-10-07 DIAGNOSIS — C7931 Secondary malignant neoplasm of brain: Secondary | ICD-10-CM | POA: Insufficient documentation

## 2012-10-07 DIAGNOSIS — C3490 Malignant neoplasm of unspecified part of unspecified bronchus or lung: Secondary | ICD-10-CM | POA: Insufficient documentation

## 2012-10-07 DIAGNOSIS — C7951 Secondary malignant neoplasm of bone: Secondary | ICD-10-CM

## 2012-10-07 HISTORY — DX: Personal history of antineoplastic chemotherapy: Z92.21

## 2012-10-07 MED ORDER — IOHEXOL 300 MG/ML  SOLN
100.0000 mL | Freq: Once | INTRAMUSCULAR | Status: AC | PRN
  Administered 2012-10-07: 100 mL via INTRAVENOUS

## 2012-10-07 MED ORDER — DEXAMETHASONE 4 MG PO TABS: ORAL_TABLET | ORAL | Status: DC

## 2012-10-07 NOTE — Telephone Encounter (Signed)
THIS REPORT WAS READ AND FAXED. THE RESULTS WERE GIVEN TO ADRENA JOHNSON,PA.

## 2012-10-07 NOTE — Progress Notes (Addendum)
Joel Gibson here for fu s/p radiation to his right hip and sacrum.  He states today that he does not feel radiation "helped much."  He states that when he awakens in the am his pain level in the right hip  is a 2-3, but by the evening his pain level is an 6 on a scale of 0-10.  He states that for the past 3 weeks he notes he is dragging his right leg and has a sensation of weakness with staggering at times.  He is c/o numbness of both feet as well..  He has reported to medical oncology that he has dizziness and notes when he closes his eyes he is "off balance."  He denies any headaches or vision changes and does not have any difficulty with fine motor movement.  He is scheduled for an CT of the head at [facility] today as ordered by Med/Onc

## 2012-10-07 NOTE — Progress Notes (Signed)
Radiation Oncology         [phone] ________________________________  Name: Joel Gibson MRN: [id number]  Date: 10/07/2012  DOB: 24-Jun-1931  Follow-Up Visit Note  Outpatient  CC: [name], MD  [name], MD  Diagnosis and Prior Radiotherapy:   Bone metastases, metastatic squamous cell carcinoma, consistent with lung primary  Indication for treatment: palliative   Prior Radiation treatment dates: 02/22/2012-03/07/2012  Site/dose:  1) pelvis and sacrum/30 Gray in 10 fractions  2) left scapula and rib/ 8 Gray in 1 fraction   Radiation treatment dates: 08/11/2012-09/03/2012  Site/dose:  1) Pelvis/ sacrum RETREAT / 30 Gy in 15 fractions  2) left Rib RETREAT / 30 Gy in 12 fractions    Narrative:  The patient returns today for routine follow-up.  He states today that he does not feel radiation "helped much." He states that when he awakens in the am his pain level in the right hip is a 2-3, but by the evening his pain level is an 6 on a scale of 0-10. He states that for the past 3 weeks he notes he is dragging his right leg and has a sensation of weakness with staggering at times. He is c/o numbness of both feet as well.. He has reported to medical oncology that he has dizziness and notes when he closes his eyes he is "off balance." He denies any headaches or vision changes and does not have any difficulty with fine motor movement. He is scheduled for an CT of the head at [facility] today as ordered by Med/Onc.   He will followup with medical oncology in 2 weeks with repeat CT scan of the chest abdomen and pelvis for restaging.       Also reports skin irritation over left torso from rib RT, now healing.             ALLERGIES:  has No Known Allergies.  Meds: Current Outpatient Prescriptions  Medication Sig Dispense Refill  . albuterol (PROVENTIL HFA;VENTOLIN HFA) 108 (90 BASE) MCG/ACT inhaler Inhale 2 puffs into the lungs every 4 (four) hours as needed.      Marland Kitchen  aspirin EC 81 MG tablet Take 81 mg by mouth every other day. Takes along with Aggrenox      . Aspirin-Dipyridamole (AGGRENOX PO) Take 25-200 mg by mouth 2 (two) times daily. Takes 2 tabs one day alt. with 1 tab next day,etc.      . betaxolol (BETOPTIC-S) 0.25 % ophthalmic suspension Place 1 drop into both eyes daily. One drop in the left eye in the am....one drop in the right eye in the am and in the pm.      . Calcium Carbonate (CALCIUM 600 PO) Take 600 mg by mouth 2 (two) times daily.      . irbesartan (AVAPRO) 300 MG tablet Take 300 mg by mouth daily.       Marland Kitchen lidocaine-prilocaine (EMLA) cream Apply 1 application topically as needed.      . loratadine (CLARITIN) 10 MG tablet Take 10 mg by mouth daily.      Marland Kitchen morphine (MS CONTIN) 15 MG 12 hr tablet Take 1 tablet (15 mg total) by mouth 2 (two) times daily.  60 tablet  0  . Multiple Vitamin (MULTIVITAMIN PO) Take 1 tablet by mouth daily.        . niacin (NIASPAN) 500 MG CR tablet Take 500 mg by mouth at bedtime.       . Nutritional Supplements (EQUATE  PO) Take by mouth. Daily      . omeprazole (PRILOSEC) 20 MG capsule Take 20 mg by mouth daily. Per Dr. Virgel Manifold      . oxyCODONE (OXY IR/ROXICODONE) 5 MG immediate release tablet Take 1 tablet (5 mg total) by mouth every 4 (four) hours as needed for pain.  45 tablet  0  . prochlorperazine (COMPAZINE) 10 MG tablet Take 10 mg by mouth every 6 (six) hours as needed. For nausea      . rosuvastatin (CRESTOR) 40 MG tablet Take 20 mg by mouth daily.       . temazepam (RESTORIL) 15 MG capsule Take 1 capsule (15 mg total) by mouth at bedtime as needed for sleep.  30 capsule  0  . tiotropium (SPIRIVA) 18 MCG inhalation capsule Place 18 mcg into inhaler and inhale daily.         No current facility-administered medications for this encounter.    Physical Findings: The patient is in no acute distress. Patient is alert and oriented.  height is 5\' 7"  (1.702 m) and weight is 152 lb 8 oz (69.174 kg). His temperature  is 97.5 F (36.4 C). His blood pressure is 172/90 and his pulse is 68. Marland Kitchen  Reports tenderness in low lumbar paraspine /upper iliac region on right.  Skin irritation over RT field from Left lateral rib - erythema and mild dryness.  Neurologically, grossly intact but I did not check leg strength.  Lab Findings: Lab Results  Component Value Date   WBC 6.6 10/03/2012   HGB 10.7* 10/03/2012   HCT 33.7* 10/03/2012   MCV 103.7* 10/03/2012   PLT 208 10/03/2012    Radiographic Findings: No results found.  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 This is a likely explanation for his right leg weakness.  Will arrange an MRI of the brain (3 tesla) ASAP and f/u with me thereafter to discuss treatment options. Dimas Alexandria is graciously starting his steroid regimen until then.  Recommended Vit E cream for skin irritation over torso.  I spent 25 minutes face to face with the patient and more than 50% of that time was spent in counseling and/or coordination of care. _____________________________________   Lonie Peak, MD

## 2012-10-07 NOTE — Progress Notes (Signed)
[facility] [phone]   [phone]  OFFICE PROGRESS NOTE  [name], MD [address] [facility], [place]. [place] [address]  DIAGNOSIS: Stage IV non-small cell lung cancer, squamous cell carcinoma with significant bone metastases diagnosed in November of 2013.   PRIOR THERAPY:  1) Status post palliative radiotherapy to the right hip and sacrum under the care of Dr. Basilio Cairo.  2) Systemic chemotherapy with carboplatin for AUC of 6 on day 1 and Abraxane 100 mg/M2 on days 1, 8 and 15 every 3 weeks. The patient is status post 5 cycles.   CURRENT THERAPY:  1) Gemcitabine 1000 mg/M2 on days 1 and 8 every 3 weeks. Status post 2 cycles as well as day 1 of cycle 3 2) Xgeva 120 mg subcutaneously every 4 weeks. Status post fiirst dose given on 08/02/2012.   INTERVAL HISTORY: Joel Gibson 79 y.o. male returns to the clinic today for follow up visit accompanied his wife. The patient tolerated the last cycle of her systemic chemotherapy fairly well except for leukocytopenia and neutropenia. He complains of feeling off balance with walking. He has some lower lumbar area pain that started after he finished his radiation therapy. He reports that very often by the end of the day he is dragging his right foot and has had some near falls. He reports that he is unable to bend over and put his socks on because he gets a "catch" in his lower back with pain. Reports the peripheral neuropathy symptoms in his feet are slightly better. Both he and his wife have noticed that his teeth have significantly yellow over time and feel as this may be related to his chemotherapy. He denied having any significant nausea or vomiting. He denied having any chest pain, shortness breath, cough or hemoptysis. The patient has mild fatigue. He is here today to start day 1 of the fourth cycle of his chemotherapy.  MEDICAL HISTORY: Past Medical History  Diagnosis Date   . Hyperlipidemia   . Hypertension   . Aortic stenosis     mild to moderate  . Gastritis   . COPD (chronic obstructive pulmonary disease)   . Bronchitis   . Hepatitis     possible  . Hx of cataract surgery     lt eye  . Tobacco abuse   . Lung cancer 02/02/2012  . Shortness of breath     with activity  . Retinal vein occlusion 1990  . GERD (gastroesophageal reflux disease)   . Hemorrhoid   . Aortic stenosis   . Hypertension   . Dyslipidemia   . Glaucoma   . Right rib fracture      Pathological Fracture / 4 weeks ago  . Lung mass     Right Upper Lobe - Lobular Mass  . Bone metastases     Widespread/ Lytic Lesion Left 7t Rib, Left Iliac Pelvis, ribs and scapula  . SOB (shortness of breath)     Mild  . Cough   . Stroke 2002    Right slight difference in vision  . S/P radiation therapy 02/22/12 - 03/07/12    Pelvis/Sacrum/30 Gray/10 Fractions and Left Scapula/Rib/ 8 Gray/1 Fraction   . Status post chemotherapy      Carboplatin/ Abraxane  . On antineoplastic chemotherapy     Gemcitabine and Xgeva    ALLERGIES:  has No Known Allergies.  MEDICATIONS:  Current Outpatient Prescriptions  Medication Sig Dispense Refill  .  albuterol (PROVENTIL HFA;VENTOLIN HFA) 108 (90 BASE) MCG/ACT inhaler Inhale 2 puffs into the lungs every 4 (four) hours as needed.      Marland Kitchen aspirin EC 81 MG tablet Take 81 mg by mouth every other day. Takes along with Aggrenox      . Aspirin-Dipyridamole (AGGRENOX PO) Take 25-200 mg by mouth 2 (two) times daily. Takes 2 tabs one day alt. with 1 tab next day,etc.      . betaxolol (BETOPTIC-S) 0.25 % ophthalmic suspension Place 1 drop into both eyes daily. One drop in the left eye in the am....one drop in the right eye in the am and in the pm.      . Calcium Carbonate (CALCIUM 600 PO) Take 600 mg by mouth 2 (two) times daily.      . irbesartan (AVAPRO) 300 MG tablet Take 300 mg by mouth daily.       Marland Kitchen lidocaine-prilocaine (EMLA) cream Apply 1 application  topically as needed.      . loratadine (CLARITIN) 10 MG tablet Take 10 mg by mouth daily.      Marland Kitchen morphine (MS CONTIN) 15 MG 12 hr tablet Take 1 tablet (15 mg total) by mouth 2 (two) times daily.  60 tablet  0  . Multiple Vitamin (MULTIVITAMIN PO) Take 1 tablet by mouth daily.        . niacin (NIASPAN) 500 MG CR tablet Take 500 mg by mouth at bedtime.       . Nutritional Supplements (EQUATE PO) Take by mouth. Daily      . omeprazole (PRILOSEC) 20 MG capsule Take 20 mg by mouth daily. Per Dr. Virgel Manifold      . oxyCODONE (OXY IR/ROXICODONE) 5 MG immediate release tablet Take 1 tablet (5 mg total) by mouth every 4 (four) hours as needed for pain.  45 tablet  0  . prochlorperazine (COMPAZINE) 10 MG tablet Take 10 mg by mouth every 6 (six) hours as needed. For nausea      . rosuvastatin (CRESTOR) 40 MG tablet Take 20 mg by mouth daily.       . temazepam (RESTORIL) 15 MG capsule Take 1 capsule (15 mg total) by mouth at bedtime as needed for sleep.  30 capsule  0  . tiotropium (SPIRIVA) 18 MCG inhalation capsule Place 18 mcg into inhaler and inhale daily.         No current facility-administered medications for this visit.    SURGICAL HISTORY:  Past Surgical History  Procedure Laterality Date  . Vasectomy    . Tonsillectomy    . Leg surgery      rt for nerve impingement  . Collarbone fracture      1977  . Tonsillectomy    . Eye surgery      catarct bil  . Portacath placement  02/08/2012    Procedure: INSERTION PORT-A-CATH;  Surgeon: [name], MD;  Location: [facility] OR;  Service: Thoracic;  Laterality: Right;  . Ct guided core biopsy  01/26/12    Left Lytic Lesion - Metastatic Squamous Cell Carcinoma  . 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   PHYSICAL EXAMINATION: General appearance: alert, cooperative, fatigued and no distress Head: Normocephalic, without obvious  abnormality, atraumatic Neck: no adenopathy Lymph nodes: Cervical, supraclavicular, and axillary nodes normal. Resp: clear to auscultation bilaterally Cardio: regular rate  and rhythm, S1, S2 normal, no murmur, click, rub or gallop GI: soft, non-tender; bowel sounds normal; no masses,  no organomegaly Extremities: extremities normal, atraumatic, no cyanosis or edema Strength in the lower extremities is 3/5 bilaterally with normal deep tendon patellar and Achilles reflexes   ECOG PERFORMANCE STATUS: 1 - Symptomatic but completely ambulatory  Blood pressure 142/67, pulse 78, temperature 97 F (36.1 C), resp. rate 20, height 5\' 7"  (1.702 m), weight 151 lb 9.6 oz (68.765 kg).  LABORATORY DATA: Lab Results  Component Value Date   WBC 6.6 10/03/2012   HGB 10.7* 10/03/2012   HCT 33.7* 10/03/2012   MCV 103.7* 10/03/2012   PLT 208 10/03/2012      Chemistry      Component Value Date/Time   NA 142 10/03/2012 1008   NA 137 03/10/2012 1804   K 4.3 10/03/2012 1008   K 3.7 03/10/2012 1804   CL 108* 08/29/2012 1100   CL 103 03/10/2012 1804   CO2 23 10/03/2012 1008   CO2 26 03/10/2012 1804   BUN 19.2 10/03/2012 1008   BUN 25* 03/10/2012 1804   CREATININE 1.1 10/03/2012 1008   CREATININE 1.04 03/10/2012 1804      Component Value Date/Time   CALCIUM 8.8 10/03/2012 1008   CALCIUM 7.9* 03/10/2012 1804   ALKPHOS 84 10/03/2012 1008   ALKPHOS 67 03/10/2012 1804   AST 19 10/03/2012 1008   AST 11 03/10/2012 1804   ALT 15 10/03/2012 1008   ALT 15 03/10/2012 1804   BILITOT 0.35 10/03/2012 1008   BILITOT 0.3 03/10/2012 1804       RADIOGRAPHIC STUDIES: No results found.  ASSESSMENT AND PLAN: this is a very pleasant 79 years old white male with metastatic non-small cell lung cancer currently undergoing systemic chemotherapy with single agent gemcitabine status post 3 cycles. Patient was discussed with Dr. Arbutus Ped. He'll proceed with cycle cold for day 1 of his systemic chemotherapy with gemcitabine today. Regarding his  complaints of and balance we will schedule him for a CT of the head with and without contrast to further evaluate the possible presence of brain metastasis causing his imbalance. He'll followup with Dr. Arbutus Ped in 3 weeks with restaging CT scan of the chest, abdomen and pelvis to reevaluate his disease. If the lumbar pain persists patient may need an MRI of this area for further evaluation.   Casha Estupinan E, PA-C   He was advised to call immediately if he has any concerning symptoms in the interval  All questions were answered. The patient knows to call the clinic with any problems, questions or concerns. We can certainly see the patient much sooner if necessary.

## 2012-10-07 NOTE — Progress Notes (Signed)
Received results of CT head with/without contrast revealing a single 2.4 cm metastasis in the left frontal lobe towards the vertex with left frontal edema. Dr. Basilio Cairo made aware of results. She will have her staff set up a MRI of the brain and appointment with her to discuss treatment options. I phoned patient with the results. He is to start dexamethasone 16 mg po stat, then 4 mg by mouth TID. Prescription for dexamethasone called to patient's pharmacy of record. Patient and his wife voiced understanding of CT results and directions for dexamethasone. Patient advised to report to the emergency department if he developed severe headache, double vision or any concerning signs or symptoms.  Laural Benes, Dayani Winbush E, PA-C

## 2012-10-10 ENCOUNTER — Ambulatory Visit: Payer: Medicare Other

## 2012-10-10 ENCOUNTER — Other Ambulatory Visit (HOSPITAL_BASED_OUTPATIENT_CLINIC_OR_DEPARTMENT_OTHER): Payer: Medicare Other | Admitting: Lab

## 2012-10-10 DIAGNOSIS — C3410 Malignant neoplasm of upper lobe, unspecified bronchus or lung: Secondary | ICD-10-CM

## 2012-10-10 LAB — CBC WITH DIFFERENTIAL/PLATELET
BASO%: 0 % (ref 0.0–2.0)
LYMPH%: 5.3 % — ABNORMAL LOW (ref 14.0–49.0)
MCHC: 33.5 g/dL (ref 32.0–36.0)
MCV: 101.5 fL — ABNORMAL HIGH (ref 79.3–98.0)
MONO%: 3.6 % (ref 0.0–14.0)
NEUT#: 8.1 10*3/uL — ABNORMAL HIGH (ref 1.5–6.5)
Platelets: 180 10*3/uL (ref 140–400)
RBC: 3.44 10*6/uL — ABNORMAL LOW (ref 4.20–5.82)
RDW: 18.5 % — ABNORMAL HIGH (ref 11.0–14.6)
WBC: 8.8 10*3/uL (ref 4.0–10.3)

## 2012-10-10 LAB — COMPREHENSIVE METABOLIC PANEL (CC13)
ALT: 20 U/L (ref 0–55)
AST: 19 U/L (ref 5–34)
Albumin: 3.8 g/dL (ref 3.5–5.0)
Alkaline Phosphatase: 80 U/L (ref 40–150)
Potassium: 3.8 mEq/L (ref 3.5–5.1)
Sodium: 142 mEq/L (ref 136–145)
Total Bilirubin: 0.36 mg/dL (ref 0.20–1.20)
Total Protein: 7.4 g/dL (ref 6.4–8.3)

## 2012-10-11 ENCOUNTER — Other Ambulatory Visit (HOSPITAL_COMMUNITY): Payer: Self-pay | Admitting: Cardiology

## 2012-10-11 DIAGNOSIS — I359 Nonrheumatic aortic valve disorder, unspecified: Secondary | ICD-10-CM

## 2012-10-12 ENCOUNTER — Ambulatory Visit: Admission: RE | Admit: 2012-10-12 | Payer: Medicare Other | Source: Ambulatory Visit | Admitting: Radiation Oncology

## 2012-10-12 ENCOUNTER — Other Ambulatory Visit: Payer: Self-pay

## 2012-10-12 ENCOUNTER — Ambulatory Visit
Admission: RE | Admit: 2012-10-12 | Discharge: 2012-10-12 | Disposition: A | Discharge status: Home / Self Care | Payer: Medicare Other | Source: Ambulatory Visit | Attending: Radiation Oncology | Admitting: Radiation Oncology

## 2012-10-12 ENCOUNTER — Ambulatory Visit (HOSPITAL_COMMUNITY): Payer: Medicare Other | Attending: Cardiology

## 2012-10-12 ENCOUNTER — Encounter: Payer: Self-pay | Admitting: Radiation Oncology

## 2012-10-12 VITALS — BP 188/89 | HR 80 | Temp 98.0°F | Resp 20 | Wt 149.4 lb

## 2012-10-12 DIAGNOSIS — I10 Essential (primary) hypertension: Secondary | ICD-10-CM | POA: Insufficient documentation

## 2012-10-12 DIAGNOSIS — C7949 Secondary malignant neoplasm of other parts of nervous system: Secondary | ICD-10-CM

## 2012-10-12 DIAGNOSIS — C7951 Secondary malignant neoplasm of bone: Secondary | ICD-10-CM | POA: Insufficient documentation

## 2012-10-12 DIAGNOSIS — F17200 Nicotine dependence, unspecified, uncomplicated: Secondary | ICD-10-CM | POA: Insufficient documentation

## 2012-10-12 DIAGNOSIS — I080 Rheumatic disorders of both mitral and aortic valves: Secondary | ICD-10-CM | POA: Insufficient documentation

## 2012-10-12 DIAGNOSIS — C3490 Malignant neoplasm of unspecified part of unspecified bronchus or lung: Secondary | ICD-10-CM | POA: Insufficient documentation

## 2012-10-12 DIAGNOSIS — E785 Hyperlipidemia, unspecified: Secondary | ICD-10-CM | POA: Insufficient documentation

## 2012-10-12 DIAGNOSIS — Z8673 Personal history of transient ischemic attack (TIA), and cerebral infarction without residual deficits: Secondary | ICD-10-CM | POA: Insufficient documentation

## 2012-10-12 DIAGNOSIS — C7931 Secondary malignant neoplasm of brain: Secondary | ICD-10-CM | POA: Insufficient documentation

## 2012-10-12 DIAGNOSIS — R0989 Other specified symptoms and signs involving the circulatory and respiratory systems: Secondary | ICD-10-CM | POA: Insufficient documentation

## 2012-10-12 DIAGNOSIS — R0609 Other forms of dyspnea: Secondary | ICD-10-CM | POA: Insufficient documentation

## 2012-10-12 DIAGNOSIS — I359 Nonrheumatic aortic valve disorder, unspecified: Secondary | ICD-10-CM

## 2012-10-12 DIAGNOSIS — J449 Chronic obstructive pulmonary disease, unspecified: Secondary | ICD-10-CM | POA: Insufficient documentation

## 2012-10-12 DIAGNOSIS — C7952 Secondary malignant neoplasm of bone marrow: Secondary | ICD-10-CM | POA: Insufficient documentation

## 2012-10-12 DIAGNOSIS — J4489 Other specified chronic obstructive pulmonary disease: Secondary | ICD-10-CM | POA: Insufficient documentation

## 2012-10-12 MED ORDER — GADOBENATE DIMEGLUMINE 529 MG/ML IV SOLN
14.0000 mL | Freq: Once | INTRAVENOUS | Status: AC | PRN
  Administered 2012-10-12: 14 mL via INTRAVENOUS

## 2012-10-12 NOTE — Progress Notes (Signed)
Pt here to see Dr Basilio Cairo re: FU MRI today. Pt alert, oriented x 3, steady gait noted. Pt denies pain, HA, nausea, vision changes. He reports dizziness, unsteadiness. He is taking Decadron 4 mg three times daily.

## 2012-10-12 NOTE — Progress Notes (Signed)
Echocardiogram performed.  

## 2012-10-12 NOTE — Progress Notes (Addendum)
Radiation Oncology         [phone]) [phone] ________________________________  Name: Joel Gibson MRN: [id number]  Date: 10/12/2012  DOB: 1931/08/15  Follow-Up Visit Note  Outpatient  CC: [name], MD  [name], [name], MD  Diagnosis and Prior Radiotherapy:   Bone metastases, metastatic squamous cell carcinoma, consistent with lung primary. Now with Brain Metastases   Prior Radiation treatment dates: 02/22/2012-03/07/2012  Site/dose:  1) pelvis and sacrum/30 Gray in 10 fractions  2) left scapula and rib/ 8 Gray in 1 fraction  Radiation treatment dates: 08/11/2012-09/03/2012  Site/dose:  1) Pelvis/ sacrum RETREAT / 30 Gy in 15 fractions  2) left Rib RETREAT / 30 Gy in 12 fractions    Narrative:  The patient returns today for follow-up.  Less than a week ago, right after he saw me, a CT of his head was performed for right leg weakness and dizziness.  It showed a new brain metastasis - 2.4 cm metastasis in the left frontal lobe towards the vertex with left frontal edema. He had been experiencing  right leg weakness and dizziness when he closed his eyes - marginally better after starting steroids.  Pain in hips/pelvis much improved on steroids.  3T MRI today demonstrates the same Left frontal met as well as a 4mm met just inferior to it.   He is alert, oriented x 3, steady gait.  He denies pain, HA, nausea, vision changes.  He is taking Decadron 4 mg three times daily.    ALLERGIES:  has No Known Allergies.  Meds: Current Outpatient Prescriptions  Medication Sig Dispense Refill  . albuterol (PROVENTIL HFA;VENTOLIN HFA) 108 (90 BASE) MCG/ACT inhaler Inhale 2 puffs into the lungs every 4 (four) hours as needed.      Marland Kitchen aspirin EC 81 MG tablet Take 81 mg by mouth every other day. Takes along with Aggrenox      . Aspirin-Dipyridamole (AGGRENOX PO) Take 25-200 mg by mouth 2 (two) times daily. Takes 2 tabs one day alt. with 1 tab next day,etc.      . betaxolol (BETOPTIC-S) 0.25 %  ophthalmic suspension Place 1 drop into both eyes daily. One drop in the left eye in the am....one drop in the right eye in the am and in the pm.      . Calcium Carbonate (CALCIUM 600 PO) Take 600 mg by mouth 2 (two) times daily.      Marland Kitchen dexamethasone (DECADRON) 4 MG tablet Take 4 tablets by mouth STAT with food, then take 1 tablet by mouth 3 times a day with food  50 tablet  0  . irbesartan (AVAPRO) 300 MG tablet Take 300 mg by mouth daily.       Marland Kitchen lidocaine-prilocaine (EMLA) cream Apply 1 application topically as needed.      . loratadine (CLARITIN) 10 MG tablet Take 10 mg by mouth daily.      Marland Kitchen morphine (MS CONTIN) 15 MG 12 hr tablet Take 1 tablet (15 mg total) by mouth 2 (two) times daily.  60 tablet  0  . Multiple Vitamin (MULTIVITAMIN PO) Take 1 tablet by mouth daily.        . niacin (NIASPAN) 500 MG CR tablet Take 500 mg by mouth at bedtime.       . Nutritional Supplements (EQUATE PO) Take by mouth. Daily      . omeprazole (PRILOSEC) 20 MG capsule Take 20 mg by mouth daily. Per Dr. Virgel Manifold      . oxyCODONE (OXY  IR/ROXICODONE) 5 MG immediate release tablet Take 1 tablet (5 mg total) by mouth every 4 (four) hours as needed for pain.  45 tablet  0  . prochlorperazine (COMPAZINE) 10 MG tablet Take 10 mg by mouth every 6 (six) hours as needed. For nausea      . rosuvastatin (CRESTOR) 40 MG tablet Take 20 mg by mouth daily.       . temazepam (RESTORIL) 15 MG capsule Take 1 capsule (15 mg total) by mouth at bedtime as needed for sleep.  30 capsule  0  . tiotropium (SPIRIVA) 18 MCG inhalation capsule Place 18 mcg into inhaler and inhale daily.         No current facility-administered medications for this encounter.    Physical Findings: The patient is in no acute distress. Patient is alert and oriented.  weight is 149 lb 6.4 oz (67.767 kg). His oral temperature is 98 F (36.7 C). His blood pressure is 188/89 and his pulse is 80. His respiration is 20. Marland Kitchen  No oral thrush, CN II-XII grossly intact.  Ambulatory, Alert and Oriented x 3.  ECOG PS 1  Lab Findings: Lab Results  Component Value Date   WBC 8.8 10/10/2012   HGB 11.7* 10/10/2012   HCT 34.9* 10/10/2012   MCV 101.5* 10/10/2012   PLT 180 10/10/2012    Radiographic Findings: Ct Head W Wo Contrast  10/07/2012   *RADIOLOGY REPORT*  Clinical Data: Metastatic lung cancer.  CT HEAD WITHOUT AND WITH CONTRAST  Technique:  Contiguous axial images were obtained from the base of the skull through the vertex without and with intravenous contrast.  Contrast: OMNIPAQUE IOHEXOL 300 MG/ML  SOLN  Comparison: MRI 01/20/2012  Findings: There is no abnormality of the brainstem or cerebellum. The patient has developed a metastatic lesion in the left frontal lobe towards the vertex measuring 24 mm in diameter with some internal necrosis.  There is vasogenic edema within the left frontal white matter with some mass effect on the frontal horn of the left lateral ventricle.  No second lesion is identified.  No lytic skull lesion is seen.  No evidence of hemorrhage, hydrocephalus or extra-axial collection.  IMPRESSION: 2.4 cm metastasis in the left frontal lobe towards the vertex with left frontal edema.  This report will be handled through our call report mechanism.   Original Report Authenticated By: Paulina Fusi, M.D.   Mr Laqueta Jean ZO Contrast  10/12/2012   *RADIOLOGY REPORT*  Clinical Data: Lung cancer.  Brain mass.  Metastatic disease.  MRI HEAD WITHOUT AND WITH CONTRAST  Technique:  Multiplanar, multiecho pulse sequences of the brain and surrounding structures were obtained according to standard protocol without and with intravenous contrast  Contrast: 14mL MULTIHANCE GADOBENATE DIMEGLUMINE 529 MG/ML IV SOLN  Comparison:  CT 10/07/2012.  MRI 01/20/2012.  Findings: Stereotactic radiosurgery protocol at 3 Tesla.  Enhancing mass lesion in the left frontal lobe over the convexity measures 18 x 25 x 24mm.  This shows evidence of prior hemorrhage. There is irregular  enhancement of the mass.  There is a small enhancing satellite lesion proximally 6 mm inferior to the larger lesion.  There is moderate vasogenic edema and mass effect around the mass.  The left lateral ventricle is depressed due to edema from above.  No other enhancing mass lesion is identified.  Generalized atrophy with mild chronic microvascular ischemia.  No acute infarct.  IMPRESSION: 18 x 25 x 24 mm enhancing mass in the high left frontal lobe  compatible with metastatic disease.  There is evidence of prior tumor hemorrhage and there is moderate surrounding white matter edema.  There is a small satellite enhancing lesion deep to the larger lesion also compatible with metastatic disease.  No other enhancing lesions identified.   Original Report Authenticated By: Janeece Riggers, M.D.    Impression/Plan:  I had a lengthy discussion with the patient and his wife after reviewing his MRI with them.  We spoke about whole brain radiotherapy versus stereotactic radiosurgery to the brain. I would favor holding surgical resection for salvage, if needed. We spoke about the differing risks benefits and side effects of both of these treatments. During part of our discussion, we spoke about the cognitive side effects and fatigue that can result from whole brain radiotherapy and we spoke about radionecrosis that can result from stereotactic radiosurgery. I explained that whole brain radiotherapy is more comprehensive and therefore can decrease the chance of recurrences elsewhere in the brain while stereotactic radiosurgery only treats the areas of gross disease while sparing the rest of the brain parenchyma.  After lengthy discussion, the patient would like to proceed with stereotactic radiosurgery to the 2 brain tumors. They will meet with neurosurgery for consultation and proceed with simulation on 10/17/12. Consent signed today.    I spent 25 minutes face to face with the patient and more than 50% of that time was spent  in counseling and/or coordination of care. _____________________________________   Lonie Peak, MD

## 2012-10-13 ENCOUNTER — Telehealth: Payer: Self-pay

## 2012-10-13 ENCOUNTER — Encounter: Payer: Self-pay | Admitting: Cardiology

## 2012-10-13 ENCOUNTER — Ambulatory Visit (INDEPENDENT_AMBULATORY_CARE_PROVIDER_SITE_OTHER): Payer: Medicare Other | Admitting: Cardiology

## 2012-10-13 VITALS — BP 154/74 | HR 89 | Ht 67.0 in | Wt 148.8 lb

## 2012-10-13 DIAGNOSIS — I639 Cerebral infarction, unspecified: Secondary | ICD-10-CM

## 2012-10-13 DIAGNOSIS — I6350 Cerebral infarction due to unspecified occlusion or stenosis of unspecified cerebral artery: Secondary | ICD-10-CM

## 2012-10-13 DIAGNOSIS — R0602 Shortness of breath: Secondary | ICD-10-CM

## 2012-10-13 DIAGNOSIS — I10 Essential (primary) hypertension: Secondary | ICD-10-CM

## 2012-10-13 DIAGNOSIS — I350 Nonrheumatic aortic (valve) stenosis: Secondary | ICD-10-CM

## 2012-10-13 DIAGNOSIS — E785 Hyperlipidemia, unspecified: Secondary | ICD-10-CM

## 2012-10-13 DIAGNOSIS — I359 Nonrheumatic aortic valve disorder, unspecified: Secondary | ICD-10-CM

## 2012-10-13 MED ORDER — AMLODIPINE BESYLATE 2.5 MG PO TABS: 2.5000 mg | ORAL_TABLET | Freq: Every day | ORAL | Status: DC

## 2012-10-13 MED ORDER — AMLODIPINE BESYLATE 2.5 MG PO TABS: 2.5000 mg | ORAL_TABLET | Freq: Every day | ORAL | Status: AC

## 2012-10-13 NOTE — Patient Instructions (Signed)
Stop niaspan.  Start amlodipine 2.5 mg daily.  Continue your other therapy  I will see you in one year.

## 2012-10-13 NOTE — Progress Notes (Signed)
Joel Gibson Date of Birth: Jan 14, 1932   History of Present Illness: Joel Gibson is seen for yearly followup of his aortic stenosis. He has a history of mild to moderate aortic stenosis. He also has a history of metastatic squamous cell carcinoma the lung. He has a new brain lesion and radiation therapy is planned. He also has a history of bony metastases and has had radiation therapy to the pelvis. He has a history of dyslipidemia. He has a history of hypertension. He states he is doing very well from a cardiac standpoint without any chest pain, shortness of breath, or palpitations. His amlodipine was discontinued and since then his blood pressure has been running higher. He is under a lot of stress currently.  Current Outpatient Prescriptions on File Prior to Visit  Medication Sig Dispense Refill  . albuterol (PROVENTIL HFA;VENTOLIN HFA) 108 (90 BASE) MCG/ACT inhaler Inhale 2 puffs into the lungs every 4 (four) hours as needed.      Marland Kitchen aspirin EC 81 MG tablet Take 81 mg by mouth every other day. Takes along with Aggrenox      . Aspirin-Dipyridamole (AGGRENOX PO) Take 25-200 mg by mouth 2 (two) times daily. Takes 2 tabs one day alt. with 1 tab next day,etc.      . betaxolol (BETOPTIC-S) 0.25 % ophthalmic suspension Place 1 drop into both eyes daily. One drop in the left eye in the am....one drop in the right eye in the am and in the pm.      . Calcium Carbonate (CALCIUM 600 PO) Take 600 mg by mouth 2 (two) times daily.      Marland Kitchen dexamethasone (DECADRON) 4 MG tablet Take 4 tablets by mouth STAT with food, then take 1 tablet by mouth 3 times a day with food  50 tablet  0  . irbesartan (AVAPRO) 300 MG tablet Take 300 mg by mouth daily.       Marland Kitchen lidocaine-prilocaine (EMLA) cream Apply 1 application topically as needed.      . loratadine (CLARITIN) 10 MG tablet Take 10 mg by mouth daily.      Marland Kitchen morphine (MS CONTIN) 15 MG 12 hr tablet Take 1 tablet (15 mg total) by mouth 2 (two) times daily.  60 tablet  0   . Multiple Vitamin (MULTIVITAMIN PO) Take 1 tablet by mouth daily.        . Nutritional Supplements (EQUATE PO) Take by mouth. Daily      . omeprazole (PRILOSEC) 20 MG capsule Take 20 mg by mouth daily. Per Dr. Virgel Manifold      . oxyCODONE (OXY IR/ROXICODONE) 5 MG immediate release tablet Take 1 tablet (5 mg total) by mouth every 4 (four) hours as needed for pain.  45 tablet  0  . prochlorperazine (COMPAZINE) 10 MG tablet Take 10 mg by mouth every 6 (six) hours as needed. For nausea      . rosuvastatin (CRESTOR) 40 MG tablet Take 20 mg by mouth daily.       . temazepam (RESTORIL) 15 MG capsule Take 1 capsule (15 mg total) by mouth at bedtime as needed for sleep.  30 capsule  0  . tiotropium (SPIRIVA) 18 MCG inhalation capsule Place 18 mcg into inhaler and inhale daily.         No current facility-administered medications on file prior to visit.    No Known Allergies  Past Medical History  Diagnosis Date  . Hyperlipidemia   . Hypertension   . Aortic stenosis  mild to moderate  . Gastritis   . COPD (chronic obstructive pulmonary disease)   . Bronchitis   . Hepatitis     possible  . Hx of cataract surgery     lt eye  . Tobacco abuse   . Shortness of breath     with activity  . Retinal vein occlusion 1990  . GERD (gastroesophageal reflux disease)   . Hemorrhoid   . Aortic stenosis   . Hypertension   . Dyslipidemia   . Glaucoma   . Right rib fracture      Pathological Fracture / 4 weeks ago  . Lung mass     Right Upper Lobe - Lobular Mass  . SOB (shortness of breath)     Mild  . Cough   . Stroke 2002    Right slight difference in vision  . S/P radiation therapy 02/22/12 - 03/07/12    Pelvis/Sacrum/30 Gray/10 Fractions and Left Scapula/Rib/ 8 Gray/1 Fraction   . Status post chemotherapy      Carboplatin/ Abraxane  . On antineoplastic chemotherapy     Gemcitabine and Xgeva  . Lung cancer 02/02/2012  . Bone metastases     Widespread/ Lytic Lesion Left 7t Rib, Left Iliac  Pelvis, ribs and scapula    Past Surgical History  Procedure Laterality Date  . Vasectomy    . Tonsillectomy    . Leg surgery      rt for nerve impingement  . Collarbone fracture      1977  . Tonsillectomy    . Eye surgery      catarct bil  . Portacath placement  02/08/2012    Procedure: INSERTION PORT-A-CATH;  Surgeon: [name], MD;  Location: [facility] OR;  Service: Thoracic;  Laterality: Right;  . Ct guided core biopsy  01/26/12    Left Lytic Lesion - Metastatic Squamous Cell Carcinoma  . 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 Former Smoker -- 2.00 packs/day for 65 years  . Types: Cigarettes  . Quit date: 01/15/2012  Smokeless tobacco  . Never Used    History  Alcohol Use  . Yes    Comment: occassional    Family History  Problem Relation Age of Onset  . Heart attack Father     x7  . Throat cancer Father     Deceased from Staph Infection  . Heart attack Father     7 Times  . Leukemia Mother     Review of Systems: The review of systems is positive for proteinuria. He reports that this has resolved. All other systems were reviewed and are negative.  Physical Exam: BP 154/74  Pulse 89  Ht 5\' 7"  (1.702 m)  Wt 148 lb 12.8 oz (67.495 kg)  BMI 23.3 kg/m2 He is a pleasant elderly male in no acute distress. He wears glasses. He is normocephalic, atraumatic. Pupils are equal round and reactive to light and accommodation. Sclera clear. Oropharynx is clear. Neck supple no JVD, adenopathy, thyromegaly, or bruits. Lungs are clear. Cardiac exam reveals a grade 2/6 systolic ejection murmur heard at the right upper sternal border. This radiates into the carotids and to the apex. Carotid upstrokes are normal. Abdomen is soft and nontender without mass or bruits. Femoral and pedal pulses are 2+ and symmetric. He has no edema. Skin is warm and dry. He is alert and  oriented x3. Cranial  nerves II through XII are intact. LABORATORY DATA: ECG today demonstrates normal sinus rhythm with a normal ECG. Occasional PACs were noted. Echo:Study Conclusions  - Left ventricle: The cavity size was normal. Wall thickness was normal. Systolic function was normal. The estimated ejection fraction was in the range of 60% to 65%. Wall motion was normal; there were no regional wall motion abnormalities. Doppler parameters are consistent with abnormal left ventricular relaxation (grade 1 diastolic dysfunction). - Aortic valve: Trileaflet; moderately calcified leaflets. There was mild to moderate stenosis. Trivial regurgitation. Mean gradient: 20mm Hg (S). Peak gradient: 35mm Hg (S). Valve area: 1.34cm^2 (Vmax). - Mitral valve: Trivial regurgitation. - Left atrium: The atrium was mildly dilated. - Right ventricle: The cavity size was normal. Systolic function was normal. - Tricuspid valve: Peak RV-RA gradient: 22mm Hg (S). - Pulmonary arteries: PA peak pressure: 27mm Hg (S). - Inferior vena cava: The vessel was normal in size; the respirophasic diameter changes were in the normal range (= 50%); findings are consistent with normal central venous pressure. Impressions:  - Normal LV size and systolic function, EF 60-65%. Normal RV size and systolic function. Mild to moderate aortic stenosis.     Assessment / Plan: 1. Aortic stenosis-mild to moderate. Unchanged from last year. Patient is asymptomatic. With his metastatic lung carcinoma think it is unlikely that his aortic stenosis will ever be clinically significant. 2. Hypertension. Previously he was on 10 mg of amlodipine. I'll reduce his dose to 2.5 mg and have him take it daily. He will continue with Avapro. 3. Dyslipidemia. Based on recent data recommended stopping his Niaspan. Continue Crestor. 4. Metastatic squamous cell carcinoma lung to bones and brain.

## 2012-10-13 NOTE — Telephone Encounter (Signed)
Received a call from patient's wife she stated she wants amlodipine sent to [facility] for 90 days.Amlodipine 2.5 mg sent to [facility].

## 2012-10-14 ENCOUNTER — Ambulatory Visit: Payer: Medicare Other | Admitting: Cardiology

## 2012-10-17 ENCOUNTER — Other Ambulatory Visit (HOSPITAL_BASED_OUTPATIENT_CLINIC_OR_DEPARTMENT_OTHER): Payer: Medicare Other | Admitting: Lab

## 2012-10-17 ENCOUNTER — Other Ambulatory Visit: Payer: Medicare Other

## 2012-10-17 ENCOUNTER — Ambulatory Visit
Admission: RE | Admit: 2012-10-17 | Discharge: 2012-10-17 | Disposition: A | Discharge status: Home / Self Care | Payer: Medicare Other | Source: Ambulatory Visit | Attending: Radiation Oncology | Admitting: Radiation Oncology

## 2012-10-17 VITALS — BP 148/68 | HR 82 | Temp 97.6°F | Ht 67.0 in | Wt 150.8 lb

## 2012-10-17 DIAGNOSIS — Z510 Encounter for antineoplastic radiation therapy: Secondary | ICD-10-CM | POA: Insufficient documentation

## 2012-10-17 DIAGNOSIS — C801 Malignant (primary) neoplasm, unspecified: Secondary | ICD-10-CM | POA: Insufficient documentation

## 2012-10-17 DIAGNOSIS — C7931 Secondary malignant neoplasm of brain: Secondary | ICD-10-CM

## 2012-10-17 DIAGNOSIS — C3490 Malignant neoplasm of unspecified part of unspecified bronchus or lung: Secondary | ICD-10-CM

## 2012-10-17 DIAGNOSIS — C7949 Secondary malignant neoplasm of other parts of nervous system: Secondary | ICD-10-CM | POA: Insufficient documentation

## 2012-10-17 LAB — CBC WITH DIFFERENTIAL/PLATELET
BASO%: 0.1 % (ref 0.0–2.0)
EOS%: 0 % (ref 0.0–7.0)
Eosinophils Absolute: 0 10*3/uL (ref 0.0–0.5)
LYMPH%: 1.1 % — ABNORMAL LOW (ref 14.0–49.0)
MCH: 33.7 pg — ABNORMAL HIGH (ref 27.2–33.4)
MCHC: 32.8 g/dL (ref 32.0–36.0)
MCV: 102.7 fL — ABNORMAL HIGH (ref 79.3–98.0)
MONO%: 11.4 % (ref 0.0–14.0)
Platelets: 127 10*3/uL — ABNORMAL LOW (ref 140–400)
RBC: 3.56 10*6/uL — ABNORMAL LOW (ref 4.20–5.82)

## 2012-10-17 LAB — COMPREHENSIVE METABOLIC PANEL (CC13)
AST: 18 U/L (ref 5–34)
Alkaline Phosphatase: 59 U/L (ref 40–150)
Glucose: 117 mg/dl (ref 70–140)
Sodium: 142 mEq/L (ref 136–145)
Total Bilirubin: 0.42 mg/dL (ref 0.20–1.20)
Total Protein: 6 g/dL — ABNORMAL LOW (ref 6.4–8.3)

## 2012-10-17 MED ORDER — SODIUM CHLORIDE 0.9 % IJ SOLN
10.0000 mL | Freq: Once | INTRAMUSCULAR | Status: AC
  Administered 2012-10-17: 10 mL via INTRAVENOUS

## 2012-10-17 MED ORDER — LIDOCAINE-PRILOCAINE 2.5-2.5 % EX CREA
TOPICAL_CREAM | Freq: Once | CUTANEOUS | Status: AC
  Administered 2012-10-17: 14:00:00 via TOPICAL
  Filled 2012-10-17: qty 5

## 2012-10-17 MED ORDER — HEPARIN SOD (PORK) LOCK FLUSH 100 UNIT/ML IV SOLN
500.0000 [IU] | Freq: Once | INTRAVENOUS | Status: AC
  Administered 2012-10-17: 500 [IU] via INTRAVENOUS

## 2012-10-17 NOTE — Progress Notes (Signed)
  Radiation Oncology         [phone] ________________________________  Name: NGOC DAUGHTRIDGE MRN: [id number]  Date: 10/17/2012  DOB: 1931/04/11  SIMULATION AND TREATMENT PLANNING NOTE  DIAGNOSIS:  Brain Metastases  NARRATIVE:  The patient was brought to the CT Simulation planning suite.  Identity was confirmed.  All relevant records and images related to the planned course of therapy were reviewed.  The patient freely provided informed written consent to proceed with treatment after reviewing the details related to the planned course of therapy. The consent form was witnessed and verified by the simulation staff. Intravenous access was established for contrast administration. Then, the patient was set-up in a stable reproducible supine position for radiation therapy.  A relocatable thermoplastic stereotactic head frame was fabricated for precise immobilization.  CT images were obtained.  Surface markings were placed.  The CT images were loaded into the planning software and fused with the patient's targeting MRI scan.  Then the target and avoidance structures were contoured.  Treatment planning then occurred.  The radiation prescription was entered and confirmed.  I have requested 3D planning  I have requested a DVH of the following structures: Brain stem, brain, left eye, right I, lenses, optic chiasm, target volumes, uninvolved brain, and normal tissue.    PLAN:  The patient will receive 18 Gy in 1 fraction to the 18 x 25 x 24 mm enhancing mass in the high left frontal lobe -- The small satellite enhancing lesion deep to the larger lesion will receive 20Gy in 1 fraction. These lesions will be treated by [facility] technique.    ________________________________ -----------------------------------  Lonie Peak, MD

## 2012-10-17 NOTE — Progress Notes (Signed)
Flushed right power port with saline then, heparin per protocol. Removed access device. Cathter intact upon removal. Applied bandaid to old access site. Patient tolerated well. Patient discharged home with wife.

## 2012-10-17 NOTE — Progress Notes (Signed)
Patient  Right power port  Accessed per protocol, sterile technique, bun and cr reported to Dr.Squire by Gareth Eagle okay to proceed, #20g 1 in huber needle used, x 1 with excellent blood return, flushed port wiith 10cc Normal saline, patient tolerated well, no c/o pain  Called  Sim, spoke with Dell Ponto therapist patient is ready 2:05 PM

## 2012-10-21 ENCOUNTER — Ambulatory Visit
Admission: RE | Admit: 2012-10-21 | Discharge: 2012-10-21 | Disposition: A | Discharge status: Home / Self Care | Payer: Medicare Other | Source: Ambulatory Visit | Attending: Radiation Oncology | Admitting: Radiation Oncology

## 2012-10-21 ENCOUNTER — Telehealth: Payer: Self-pay | Admitting: *Deleted

## 2012-10-21 ENCOUNTER — Encounter: Payer: Self-pay | Admitting: Radiation Oncology

## 2012-10-21 ENCOUNTER — Ambulatory Visit (HOSPITAL_COMMUNITY)
Admission: RE | Admit: 2012-10-21 | Discharge: 2012-10-21 | Disposition: A | Discharge status: Home / Self Care | Payer: Medicare Other | Source: Ambulatory Visit | Attending: Physician Assistant | Admitting: Physician Assistant

## 2012-10-21 VITALS — BP 145/69 | HR 75 | Temp 98.1°F | Resp 20

## 2012-10-21 DIAGNOSIS — Z923 Personal history of irradiation: Secondary | ICD-10-CM

## 2012-10-21 DIAGNOSIS — R911 Solitary pulmonary nodule: Secondary | ICD-10-CM | POA: Insufficient documentation

## 2012-10-21 DIAGNOSIS — K5730 Diverticulosis of large intestine without perforation or abscess without bleeding: Secondary | ICD-10-CM | POA: Insufficient documentation

## 2012-10-21 DIAGNOSIS — C3490 Malignant neoplasm of unspecified part of unspecified bronchus or lung: Secondary | ICD-10-CM | POA: Insufficient documentation

## 2012-10-21 DIAGNOSIS — C7952 Secondary malignant neoplasm of bone marrow: Secondary | ICD-10-CM | POA: Insufficient documentation

## 2012-10-21 DIAGNOSIS — N281 Cyst of kidney, acquired: Secondary | ICD-10-CM | POA: Insufficient documentation

## 2012-10-21 DIAGNOSIS — R935 Abnormal findings on diagnostic imaging of other abdominal regions, including retroperitoneum: Secondary | ICD-10-CM | POA: Insufficient documentation

## 2012-10-21 DIAGNOSIS — C7931 Secondary malignant neoplasm of brain: Secondary | ICD-10-CM

## 2012-10-21 DIAGNOSIS — C7951 Secondary malignant neoplasm of bone: Secondary | ICD-10-CM | POA: Insufficient documentation

## 2012-10-21 DIAGNOSIS — R599 Enlarged lymph nodes, unspecified: Secondary | ICD-10-CM | POA: Insufficient documentation

## 2012-10-21 DIAGNOSIS — Z79899 Other long term (current) drug therapy: Secondary | ICD-10-CM | POA: Insufficient documentation

## 2012-10-21 HISTORY — DX: Personal history of irradiation: Z92.3

## 2012-10-21 MED ORDER — DEXAMETHASONE 2 MG PO TABS: ORAL_TABLET | ORAL | Status: DC

## 2012-10-21 MED ORDER — IOHEXOL 300 MG/ML  SOLN
100.0000 mL | Freq: Once | INTRAMUSCULAR | Status: AC | PRN
  Administered 2012-10-21: 100 mL via INTRAVENOUS

## 2012-10-21 NOTE — Telephone Encounter (Signed)
Neurosurgical consultation dated 10/17/12  from Dr Newell Coral given to Dr Donnald Garre to review.  SLJ

## 2012-10-21 NOTE — Progress Notes (Signed)
  Radiation Oncology         [phone]) [phone] ________________________________  Stereotactic Treatment Procedure Note  Name: Joel Gibson MRN: [id number]  Date: 10/21/2012  DOB: 24-Dec-1931  SPECIAL TREATMENT PROCEDURE Outpatient Brain Metastases  3D TREATMENT PLANNING AND DOSIMETRY:  The patient's radiation plan was reviewed and approved by neurosurgery and radiation oncology prior to treatment.  It showed 3-dimensional radiation distributions overlaid onto the planning CT/MRI image set.  The [facility] for the target structures as well as the organs at risk were reviewed. The documentation of the 3D plan and dosimetry are filed in the radiation oncology EMR.  NARRATIVE:  Joel Gibson was brought to the TrueBeam stereotactic radiation treatment machine and placed supine on the CT couch. The head frame was applied, and the patient was set up for stereotactic radiosurgery.  Neurosurgery was present for the set-up and delivery  SIMULATION VERIFICATION:  In the couch zero-angle position, the patient underwent Exactrac imaging using the Brainlab system with orthogonal KV images.  These were carefully aligned and repeated to confirm treatment position for each of the isocenters.  The Exactrac snap film verification was repeated at each couch angle.  SPECIAL TREATMENT PROCEDURE: [name] received stereotactic radiosurgery to the following targets: Left frontal 25mm target was treated using 4 Rapid Arc VMAT Beams to a prescription dose of 18 Gy.  ExacTrac Snap verification was performed for each couch angle.  Left 6mm target was treated using the same 4 Rapid Arc VMAT Beams as the adjacent left frontal 25mm lesion to a prescription dose of 20 Gy.  ExacTrac Snap verification was performed for each couch angle.  This constitutes a special treatment procedure due to the ablative dose delivered and the technical nature of treatment.  This highly technical modality of treatment ensures that the ablative  dose is centered on the patient's tumor while sparing normal tissues from excessive dose and risk of detrimental effects.  STEREOTACTIC TREATMENT MANAGEMENT:  Following delivery, the patient was transported to nursing in stable condition and monitored for possible acute effects.  Vital signs were recorded BP 145/69  Pulse 75  Temp(Src) 98.1 F (36.7 C) (Oral)  Resp 20. The patient tolerated treatment without significant acute effects, and was discharged to home in stable condition.    PLAN: Follow-up in one month. Dexamethasone taper: Take 2 tablets three times a day through 10/28/12  On 8/23 start taking 2 tablets at breakfast and lunch only  On 9/6 start taking 1 tablet at breakfast and lunch only  On 9/13 start taking 1 tablet at breakfast only  Stop taking dexamethasone on 11/26/12.  ________________________________   Lonie Peak, MD

## 2012-10-21 NOTE — Progress Notes (Signed)
Pt resting quietly, reclining in exam rm 12, wife present. Pt's VS stable. He states "my BP is good considering what I've been through today". Pt denies HA, pain, nausea, vision issues, dizziness. He is drinking coffee. Dr Basilio Cairo in to see pt 2:35 pm. 3:25 pm VS stable, BP is lower. Pt denies HA, pain, nausea, vision changes, dizziness. Pt's RAC bandage removed. Pt states hematoma "is smaller than it was". Area without bruising, redness, bleeding.  He denies pain in this area. Advised pt to apply ice pack to RAC but not directly on skin.  Skin under Hyperfix tape is red. Also advised wife to apply lotion to red areas where tape was. Reminded pt and wife that [facility] has dr on call over weekend, and wife verbalized phone number to call. Offered to push pt to lobby in wheelchair. Pt stood and stated he was not dizzy, and he refused wheelchair. Pt and wife d/c home, walking to elevator. Pt had steady gait.

## 2012-10-21 NOTE — Patient Instructions (Signed)
Dexamethasone 2mg  tablet taper:  Take 2 tablets three times a day through 10/28/12  On 8/23 start taking 2 tablets at breakfast and lunch only  On 9/6 start taking 1 tablet at breakfast and lunch only  On 9/13 start taking 1 tablet at breakfast only  Stop taking dexamethasone on 11/26/12.

## 2012-10-21 NOTE — Op Note (Signed)
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 Lonie Peak (radiation oncology) prior to treatment.  It showed 3-dimensional radiation distributions overlaid onto the planning CT/MRI image set.  The [facility] for the target structures as well as the organs at risk were reviewed. The documentation of the 3D plan and dosimetry are filed in the radiation oncology EMR.  NARRATIVE:  [name] was brought to the TrueBeam stereotactic radiation treatment machine and placed supine on the CT couch. The head frame was applied, and the patient was set up for stereotactic radiosurgery.  I was present for the set-up and delivery.  SIMULATION VERIFICATION:  In the couch zero-angle position, the patient underwent Exactrac imaging using the Brainlab system with orthogonal KV images.  These were carefully aligned and repeated to confirm treatment position for each of the isocenters.  The Exactrac snap film verification was repeated at each couch angle.  SPECIAL TREATMENT PROCEDURE: [name] received stereotactic radiosurgery to the following targets:  Left frontal 25mm target was treated using 4 Rapid Arc VMAT Beams to a prescription dose of 18 Gy. ExacTrac Snap verification was performed for each couch angle.  Left 6mm target was treated using the same 4 Rapid Arc VMAT Beams as the adjacent left frontal 25mm lesion to a prescription dose of 20 Gy. ExacTrac Snap verification was performed for each couch angle.  This constitutes a special treatment procedure due to the ablative dose delivered and the technical nature of treatment. This highly  technical modality of treatment ensures that the ablative dose is centered on the patient's tumor while sparing normal tissues from excessive dose and risk of detrimental effects.  STEREOTACTIC TREATMENT MANAGEMENT:  Following delivery, the patient was transported to nursing in stable condition and monitored for possible acute effects.  Vital signs were recorded There were no vitals taken for this visit.. The patient tolerated treatment without significant acute effects, and was discharged to home in stable condition.    PLAN: Follow-up in one month.

## 2012-10-24 ENCOUNTER — Other Ambulatory Visit: Payer: Medicare Other | Admitting: Lab

## 2012-10-24 ENCOUNTER — Ambulatory Visit (HOSPITAL_BASED_OUTPATIENT_CLINIC_OR_DEPARTMENT_OTHER): Payer: Medicare Other

## 2012-10-24 ENCOUNTER — Ambulatory Visit: Payer: Medicare Other

## 2012-10-24 ENCOUNTER — Telehealth: Payer: Self-pay | Admitting: Internal Medicine

## 2012-10-24 ENCOUNTER — Other Ambulatory Visit (HOSPITAL_BASED_OUTPATIENT_CLINIC_OR_DEPARTMENT_OTHER): Payer: Medicare Other | Admitting: Lab

## 2012-10-24 ENCOUNTER — Telehealth: Payer: Self-pay | Admitting: *Deleted

## 2012-10-24 ENCOUNTER — Other Ambulatory Visit: Payer: Self-pay | Admitting: Oncology

## 2012-10-24 ENCOUNTER — Encounter: Payer: Self-pay | Admitting: Internal Medicine

## 2012-10-24 ENCOUNTER — Ambulatory Visit (HOSPITAL_BASED_OUTPATIENT_CLINIC_OR_DEPARTMENT_OTHER): Payer: Medicare Other | Admitting: Internal Medicine

## 2012-10-24 VITALS — BP 134/57 | HR 89 | Temp 97.4°F | Resp 20 | Ht 67.0 in | Wt 148.4 lb

## 2012-10-24 DIAGNOSIS — C7951 Secondary malignant neoplasm of bone: Secondary | ICD-10-CM

## 2012-10-24 DIAGNOSIS — C3410 Malignant neoplasm of upper lobe, unspecified bronchus or lung: Secondary | ICD-10-CM

## 2012-10-24 DIAGNOSIS — Z5111 Encounter for antineoplastic chemotherapy: Secondary | ICD-10-CM

## 2012-10-24 DIAGNOSIS — K3184 Gastroparesis: Secondary | ICD-10-CM

## 2012-10-24 DIAGNOSIS — C3491 Malignant neoplasm of unspecified part of right bronchus or lung: Secondary | ICD-10-CM

## 2012-10-24 LAB — CBC WITH DIFFERENTIAL/PLATELET
Basophils Absolute: 0 10*3/uL (ref 0.0–0.1)
Eosinophils Absolute: 0 10*3/uL (ref 0.0–0.5)
LYMPH%: 5.3 % — ABNORMAL LOW (ref 14.0–49.0)
MCV: 101.3 fL — ABNORMAL HIGH (ref 79.3–98.0)
MONO%: 3.1 % (ref 0.0–14.0)
NEUT#: 14.3 10*3/uL — ABNORMAL HIGH (ref 1.5–6.5)
Platelets: 159 10*3/uL (ref 140–400)
RBC: 3.73 10*6/uL — ABNORMAL LOW (ref 4.20–5.82)

## 2012-10-24 LAB — COMPREHENSIVE METABOLIC PANEL (CC13)
CO2: 24 mEq/L (ref 22–29)
Creatinine: 0.9 mg/dL (ref 0.7–1.3)
Glucose: 103 mg/dl (ref 70–140)
Total Bilirubin: 0.48 mg/dL (ref 0.20–1.20)

## 2012-10-24 MED ORDER — DEXAMETHASONE 4 MG PO TABS: 4.0000 mg | ORAL_TABLET | ORAL | Status: DC

## 2012-10-24 MED ORDER — DENOSUMAB 120 MG/1.7ML ~~LOC~~ SOLN
120.0000 mg | Freq: Once | SUBCUTANEOUS | Status: AC
  Administered 2012-10-24: 120 mg via SUBCUTANEOUS
  Filled 2012-10-24: qty 1.7

## 2012-10-24 MED ORDER — DOCETAXEL CHEMO INJECTION 160 MG/16ML
75.0000 mg/m2 | Freq: Once | INTRAVENOUS | Status: AC
  Administered 2012-10-24: 130 mg via INTRAVENOUS
  Filled 2012-10-24: qty 13

## 2012-10-24 MED ORDER — ONDANSETRON 8 MG/50ML IVPB (CHCC)
8.0000 mg | Freq: Once | INTRAVENOUS | Status: AC
  Administered 2012-10-24: 8 mg via INTRAVENOUS

## 2012-10-24 MED ORDER — DEXAMETHASONE SODIUM PHOSPHATE 10 MG/ML IJ SOLN
10.0000 mg | Freq: Once | INTRAMUSCULAR | Status: AC
  Administered 2012-10-24: 10 mg via INTRAVENOUS

## 2012-10-24 MED ORDER — SODIUM CHLORIDE 0.9 % IV SOLN
Freq: Once | INTRAVENOUS | Status: AC
  Administered 2012-10-24: 10:00:00 via INTRAVENOUS

## 2012-10-24 MED ORDER — SODIUM CHLORIDE 0.9 % IJ SOLN
10.0000 mL | INTRAMUSCULAR | Status: DC | PRN
  Administered 2012-10-24: 10 mL
  Filled 2012-10-24: qty 10

## 2012-10-24 MED ORDER — HEPARIN SOD (PORK) LOCK FLUSH 100 UNIT/ML IV SOLN
500.0000 [IU] | Freq: Once | INTRAVENOUS | Status: AC | PRN
  Administered 2012-10-24: 500 [IU]
  Filled 2012-10-24: qty 5

## 2012-10-24 MED ORDER — METOCLOPRAMIDE HCL 10 MG PO TABS: 10.0000 mg | ORAL_TABLET | Freq: Four times a day (QID) | ORAL | Status: DC

## 2012-10-24 NOTE — Telephone Encounter (Signed)
Per staff message and POF I have scheduled appts.  JMW  

## 2012-10-24 NOTE — Patient Instructions (Addendum)
[facility] Discharge Instructions for Patients Receiving Chemotherapy  Today you received the following chemotherapy agents :  Taxotere.  To help prevent nausea and vomiting after your treatment, we encourage you to take your nausea medication as instructed by your physician.Docetaxel injection What is this medicine? DOCETAXEL (doe se TAX el) is a chemotherapy drug. It targets fast dividing cells, like cancer cells, and causes these cells to die. This medicine is used to treat many types of cancers like breast cancer, certain stomach cancers, head and neck cancer, lung cancer, and prostate cancer. This medicine may be used for other purposes; ask your health care provider or pharmacist if you have questions. What should I tell my health care provider before I take this medicine? They need to know if you have any of these conditions: -infection (especially a virus infection such as chickenpox, cold sores, or herpes) -liver disease -low blood counts, like low white cell, platelet, or red cell counts -an unusual or allergic reaction to docetaxel, polysorbate 80, other chemotherapy agents, other medicines, foods, dyes, or preservatives -pregnant or trying to get pregnant -breast-feeding How should I use this medicine? This drug is given as an infusion into a vein. It is administered in a hospital or clinic by a specially trained health care professional. Talk to your pediatrician regarding the use of this medicine in children. Special care may be needed. Overdosage: If you think you have taken too much of this medicine contact a poison control center or emergency room at once. NOTE: This medicine is only for you. Do not share this medicine with others. What if I miss a dose? It is important not to miss your dose. Call your doctor or health care professional if you are unable to keep an appointment. What may interact with this  medicine? -cyclosporine -erythromycin -ketoconazole -medicines to increase blood counts like filgrastim, pegfilgrastim, sargramostim -vaccines Talk to your doctor or health care professional before taking any of these medicines: -acetaminophen -aspirin -ibuprofen -ketoprofen -naproxen This list may not describe all possible interactions. Give your health care provider a list of all the medicines, herbs, non-prescription drugs, or dietary supplements you use. Also tell them if you smoke, drink alcohol, or use illegal drugs. Some items may interact with your medicine. What should I watch for while using this medicine? Your condition will be monitored carefully while you are receiving this medicine. You will need important blood work done while you are taking this medicine. This drug may make you feel generally unwell. This is not uncommon, as chemotherapy can affect healthy cells as well as cancer cells. Report any side effects. Continue your course of treatment even though you feel ill unless your doctor tells you to stop. In some cases, you may be given additional medicines to help with side effects. Follow all directions for their use. Call your doctor or health care professional for advice if you get a fever, chills or sore throat, or other symptoms of a cold or flu. Do not treat yourself. This drug decreases your body's ability to fight infections. Try to avoid being around people who are sick. This medicine may increase your risk to bruise or bleed. Call your doctor or health care professional if you notice any unusual bleeding. Be careful brushing and flossing your teeth or using a toothpick because you may get an infection or bleed more easily. If you have any dental work done, tell your dentist you are receiving this medicine. Avoid taking products that contain aspirin,  acetaminophen, ibuprofen, naproxen, or ketoprofen unless instructed by your doctor. These medicines may hide a fever. Do  not become pregnant while taking this medicine. Women should inform their doctor if they wish to become pregnant or think they might be pregnant. There is a potential for serious side effects to an unborn child. Talk to your health care professional or pharmacist for more information. Do not breast-feed an infant while taking this medicine. What side effects may I notice from receiving this medicine? Side effects that you should report to your doctor or health care professional as soon as possible: -allergic reactions like skin rash, itching or hives, swelling of the face, lips, or tongue -low blood counts - This drug may decrease the number of white blood cells, red blood cells and platelets. You may be at increased risk for infections and bleeding. -signs of infection - fever or chills, cough, sore throat, pain or difficulty passing urine -signs of decreased platelets or bleeding - bruising, pinpoint red spots on the skin, black, tarry stools, nosebleeds -signs of decreased red blood cells - unusually weak or tired, fainting spells, lightheadedness -breathing problems -fast or irregular heartbeat -low blood pressure -mouth sores -nausea and vomiting -pain, swelling, redness or irritation at the injection site -pain, tingling, numbness in the hands or feet -swelling of the ankle, feet, hands -weight gain Side effects that usually do not require medical attention (report to your prescriber or health care professional if they continue or are bothersome): -bone pain -complete hair loss including hair on your head, underarms, pubic hair, eyebrows, and eyelashes -diarrhea -excessive tearing -changes in the color of fingernails -loosening of the fingernails -nausea -muscle pain -red flush to skin -sweating -weak or tired This list may not describe all possible side effects. Call your doctor for medical advice about side effects. You may report side effects to FDA at 1-800-FDA-1088. Where  should I keep my medicine? This drug is given in a hospital or clinic and will not be stored at home. NOTE: This sheet is a summary. It may not cover all possible information. If you have questions about this medicine, talk to your doctor, pharmacist, or health care provider.  2013, [employer]. (02/06/2008 11:52:10 AM)    If you develop nausea and vomiting that is not controlled by your nausea medication, call the clinic.   BELOW ARE SYMPTOMS THAT SHOULD BE REPORTED IMMEDIATELY:  *FEVER GREATER THAN 100.5 F  *CHILLS WITH OR WITHOUT FEVER  NAUSEA AND VOMITING THAT IS NOT CONTROLLED WITH YOUR NAUSEA MEDICATION  *UNUSUAL SHORTNESS OF BREATH  *UNUSUAL BRUISING OR BLEEDING  TENDERNESS IN MOUTH AND THROAT WITH OR WITHOUT PRESENCE OF ULCERS  *URINARY PROBLEMS  *BOWEL PROBLEMS  UNUSUAL RASH Items with * indicate a potential emergency and should be followed up as soon as possible.  Feel free to call the clinic you have any questions or concerns. The clinic phone number is [phone].

## 2012-10-24 NOTE — Progress Notes (Signed)
[facility] [phone]   [phone]  OFFICE PROGRESS NOTE  [name], MD [address] [facility], [place]. 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   PRIOR THERAPY:  1) Status post palliative radiotherapy to the right hip and sacrum under the care of Dr. Basilio Cairo.  2) Systemic chemotherapy with carboplatin for AUC of 6 on day 1 and Abraxane 100 mg/M2 on days 1, 8 and 15 every 3 weeks. The patient is status post 5 cycles.  3) Gemcitabine 1000 mg/M2 on days 1 and 8 every 3 weeks. Status post 3 cycles, last dose 10/03/2012, discontinued today secondary to disease progression. 4) stereotactic radiotherapy to multiple brain lesions under the care of Dr. Basilio Cairo completed on 10/21/2012.  CURRENT THERAPY:  1)  Docetaxel 75 mg/M2 every 3 weeks with Neulasta support. First dose today 10/24/2012. 2) Xgeva 120 mg subcutaneously every 4 weeks. Status post fiirst dose given on 08/02/2012.  CHEMOTHERAPY INTENT: Palliative  CURRENT # OF CHEMOTHERAPY CYCLES: 1  CURRENT ANTIEMETICS: Zofran, dexamethasone and Compazine  CURRENT SMOKING STATUS: Former smoker quit 01/15/2012  ORAL CHEMOTHERAPY AND CONSENT: None  CURRENT BISPHOSPHONATES USE: None  PAIN MANAGEMENT: 0/10  NARCOTICS INDUCED CONSTIPATION: None  LIVING WILL AND CODE STATUS: Has living will. Initial full code but not prolonged resuscitation.    INTERVAL HISTORY: [facility] 79 y.o. male returns to the clinic today for followup visit accompanied his wife. The patient is feeling fine today with no specific complaints except for mild fatigue. He denied having any significant nausea or vomiting. He has no fever or chills. The patient denied having any significant chest pain, shortness breath, cough or hemoptysis. The patient has a problem with gastric  emptying. He had repeat CT scan of the chest, abdomen and pelvis performed recently and he is here for evaluation and discussion of his scan results.  MEDICAL HISTORY: Past Medical History  Diagnosis Date  . Hyperlipidemia   . Hypertension   . Aortic stenosis     mild to moderate  . Gastritis   . COPD (chronic obstructive pulmonary disease)   . Bronchitis   . Hepatitis     possible  . Hx of cataract surgery     lt eye  . Tobacco abuse   . Shortness of breath     with activity  . Retinal vein occlusion 1990  . GERD (gastroesophageal reflux disease)   . Hemorrhoid   . Aortic stenosis   . Hypertension   . Dyslipidemia   . Glaucoma   . Right rib fracture      Pathological Fracture / 4 weeks ago  . Lung mass     Right Upper Lobe - Lobular Mass  . SOB (shortness of breath)     Mild  . Cough   . Stroke 2002    Right slight difference in vision  . S/P radiation therapy 02/22/12 - 03/07/12    Pelvis/Sacrum/30 Gray/10 Fractions and Left Scapula/Rib/ 8 Gray/1 Fraction   . Status post chemotherapy      Carboplatin/ Abraxane  . On antineoplastic chemotherapy     Gemcitabine and Xgeva  . Lung cancer 02/02/2012  . Bone metastases     Widespread/ Lytic Lesion Left 7t Rib, Left Iliac Pelvis, ribs and scapula    ALLERGIES:  has No Known Allergies.  MEDICATIONS:  Current Outpatient Prescriptions  Medication  Sig Dispense Refill  . albuterol (PROVENTIL HFA;VENTOLIN HFA) 108 (90 BASE) MCG/ACT inhaler Inhale 2 puffs into the lungs every 4 (four) hours as needed.      Marland Kitchen amLODipine (NORVASC) 2.5 MG tablet Take 1 tablet (2.5 mg total) by mouth daily.  90 tablet  3  . aspirin EC 81 MG tablet Take 81 mg by mouth every other day. Takes along with Aggrenox      . Aspirin-Dipyridamole (AGGRENOX PO) Take 25-200 mg by mouth 2 (two) times daily. Takes 2 tabs one day alt. with 1 tab next day,etc.      . betaxolol (BETOPTIC-S) 0.25 % ophthalmic suspension Place 1 drop into both eyes daily. One  drop in the left eye in the am....one drop in the right eye in the am and in the pm.      . Calcium Carbonate (CALCIUM 600 PO) Take 600 mg by mouth 2 (two) times daily.      Marland Kitchen dexamethasone (DECADRON) 2 MG tablet take 2 tablets by mouth 3 times a day with food and taper as directed  90 tablet  1  . irbesartan (AVAPRO) 300 MG tablet Take 300 mg by mouth daily.       Marland Kitchen lidocaine-prilocaine (EMLA) cream Apply 1 application topically as needed.      . loratadine (CLARITIN) 10 MG tablet Take 10 mg by mouth daily.      . Multiple Vitamin (MULTIVITAMIN PO) Take 1 tablet by mouth daily.        . Nutritional Supplements (EQUATE PO) Take by mouth. Daily      . omeprazole (PRILOSEC) 20 MG capsule Take 20 mg by mouth daily. Per Dr. Virgel Manifold      . oxyCODONE (OXY IR/ROXICODONE) 5 MG immediate release tablet Take 1 tablet (5 mg total) by mouth every 4 (four) hours as needed for pain.  45 tablet  0  . prochlorperazine (COMPAZINE) 10 MG tablet Take 10 mg by mouth every 6 (six) hours as needed. For nausea      . rosuvastatin (CRESTOR) 40 MG tablet Take 20 mg by mouth daily.       . temazepam (RESTORIL) 15 MG capsule Take 1 capsule (15 mg total) by mouth at bedtime as needed for sleep.  30 capsule  0  . tiotropium (SPIRIVA) 18 MCG inhalation capsule Place 18 mcg into inhaler and inhale daily.        Marland Kitchen morphine (MS CONTIN) 15 MG 12 hr tablet Take 1 tablet (15 mg total) by mouth 2 (two) times daily.  60 tablet  0   No current facility-administered medications for this visit.    SURGICAL HISTORY:  Past Surgical History  Procedure Laterality Date  . Vasectomy    . Tonsillectomy    . Leg surgery      rt for nerve impingement  . Collarbone fracture      1977  . Tonsillectomy    . Eye surgery      catarct bil  . Portacath placement  02/08/2012    Procedure: INSERTION PORT-A-CATH;  Surgeon: [name], MD;  Location: [facility] OR;  Service: Thoracic;  Laterality: Right;  . Ct guided core biopsy  01/26/12    Left  Lytic Lesion - Metastatic Squamous Cell Carcinoma  . Skin biopsy  02/09/12    Right Upper Back, shave : Melanoma In Situ Arising  From a Dysplastic Nevus    REVIEW OF SYSTEMS:  A comprehensive review of systems was negative except for: Constitutional:  positive for fatigue   PHYSICAL EXAMINATION: General appearance: alert, cooperative, fatigued and no distress Head: Normocephalic, without obvious abnormality, atraumatic Neck: no adenopathy Lymph nodes: Cervical, supraclavicular, and axillary nodes normal. Resp: clear to auscultation bilaterally Cardio: regular rate and rhythm, S1, S2 normal, no murmur, click, rub or gallop GI: soft, non-tender; bowel sounds normal; no masses,  no organomegaly Extremities: extremities normal, atraumatic, no cyanosis or edema Neurologic: Alert and oriented X 3, normal strength and tone. Normal symmetric reflexes. Normal coordination and gait  ECOG PERFORMANCE STATUS: 1 - Symptomatic but completely ambulatory  Blood pressure 134/57, pulse 89, temperature 97.4 F (36.3 C), temperature source Oral, resp. rate 20, height 5\' 7"  (1.702 m), weight 148 lb 6.4 oz (67.314 kg).  LABORATORY DATA: Lab Results  Component Value Date   WBC 15.6* 10/24/2012   HGB 12.5* 10/24/2012   HCT 37.8* 10/24/2012   MCV 101.3* 10/24/2012   PLT 159 10/24/2012      Chemistry      Component Value Date/Time   NA 142 10/17/2012 1223   NA 137 03/10/2012 1804   K 4.0 10/17/2012 1223   K 3.7 03/10/2012 1804   CL 108* 08/29/2012 1100   CL 103 03/10/2012 1804   CO2 22 10/17/2012 1223   CO2 26 03/10/2012 1804   BUN 33.2* 10/17/2012 1223   BUN 25* 03/10/2012 1804   CREATININE 1.2 10/17/2012 1223   CREATININE 1.04 03/10/2012 1804      Component Value Date/Time   CALCIUM 8.2* 10/17/2012 1223   CALCIUM 7.9* 03/10/2012 1804   ALKPHOS 59 10/17/2012 1223   ALKPHOS 67 03/10/2012 1804   AST 18 10/17/2012 1223   AST 11 03/10/2012 1804   ALT 24 10/17/2012 1223   ALT 15 03/10/2012 1804   BILITOT 0.42 10/17/2012 1223     BILITOT 0.3 03/10/2012 1804       RADIOGRAPHIC STUDIES: Ct Head W Wo Contrast  10/07/2012   *RADIOLOGY REPORT*  Clinical Data: Metastatic lung cancer.  CT HEAD WITHOUT AND WITH CONTRAST  Technique:  Contiguous axial images were obtained from the base of the skull through the vertex without and with intravenous contrast.  Contrast: OMNIPAQUE IOHEXOL 300 MG/ML  SOLN  Comparison: MRI 01/20/2012  Findings: There is no abnormality of the brainstem or cerebellum. The patient has developed a metastatic lesion in the left frontal lobe towards the vertex measuring 24 mm in diameter with some internal necrosis.  There is vasogenic edema within the left frontal white matter with some mass effect on the frontal horn of the left lateral ventricle.  No second lesion is identified.  No lytic skull lesion is seen.  No evidence of hemorrhage, hydrocephalus or extra-axial collection.  IMPRESSION: 2.4 cm metastasis in the left frontal lobe towards the vertex with left frontal edema.  This report will be handled through our call report mechanism.   Original Report Authenticated By: Paulina Fusi, M.D.   Ct Chest W Contrast  10/21/2012   *RADIOLOGY REPORT*  Clinical Data:  Restaging non small cell lung cancer.  Brain and bone metastasis.  Chemotherapy ongoing.  CT CHEST, ABDOMEN AND PELVIS WITH CONTRAST  Technique:  Multidetector CT imaging of the chest, abdomen and pelvis was performed following the standard protocol during bolus administration of intravenous contrast.  Contrast: OMNIPAQUE IOHEXOL 300 MG/ML  SOLN  Comparison:  CT 04/20 1014    CT CHEST  Findings:  Right upper lobe pulmonary mass continues to increase in size measuring 35 x 46  mm compared to 30 x 30 mm on prior (image 18).  There is a second 16 mm nodule inferior to the dominant mass (image 21) which is not evident on prior.  There is interval enlargement of the suspicious left axillary lymph node measuring 16 mm compared to 11 mm on prior.  There  is a new adjacent enlarged lymph node measuring 10 mm on image 18.  Nodule posterior to the left scapular musculature measuring 8 mm (image 14) compared to 7 mm on prior.  No axillary lymphadenopathy.  No pericardial fluid.  Esophagus is normal.  IMPRESSION:  1.  Interval continued enlargement of right upper lobe pulmonary mass. 2.  New right upper lobe pulmonary nodule. 3.  Interval increase in left axillary adenopathy and nodule posterior to the left scapular musculature consistent  with metastasis.    CT ABDOMEN AND PELVIS  Findings:  There is no focal hepatic lesion.  Gallbladder, pancreas, spleen, adrenal glands, and kidneys are unchanged.  There are multiple nonenhancing cysts within the left and right kidney.  Stomach, small bowel, appendix, and cecum are normal.  The colon rectosigmoid colon are normal.  There are several diverticula the sigmoid colon.  Abdominal aorta normal caliber.  No retroperitoneal periportal lymphadenopathy.  Mesenteric disease.  No peritoneal disease. Nodule in the posterior right pararenal space measuring 9 mm (image 63) may be slightly increased from 8 mm on prior.  Prostate gland bladder normal.  No pelvic lymphadenopathy.  The large destructive lesion within the sacrum measures 7.2 x 4.0 cm compared to 5.5 x 2.5 cm on prior.  Lytic lesion in the posterior right ilium measures 28 mm not changed from 30 mm on prior.  Lesion within L3  vertebral body has increased in size measuring 3.4 x 2.9 cm increased from 1.7 x 1.5 cm.  This lesion has soft tissue expansion anteriorly and extends to the disc space. The small lesion within the inferior scapula on the left measures 15 mm increased from the 5 mm on prior.  The irregular lesion in the left lateral rib is slightly more sclerotic.  IMPRESSION:  1.  Small nodule within the right retroperitoneal space adjacent to the liver and kidney may be slight increased in size. 2.  Definitive increase in size of skeletal metastasis within the  sacrum and L3 vertebral body.  Soft tissue extension from these lesions.  3.  Stable lytic lesion within the right  ilium.  Left scapular left scapular lesion is slightly increased.   Original Report Authenticated By: Genevive Bi, M.D.   Mr Laqueta Jean ZO Contrast  10/12/2012   *RADIOLOGY REPORT*  Clinical Data: Lung cancer.  Brain mass.  Metastatic disease.  MRI HEAD WITHOUT AND WITH CONTRAST  Technique:  Multiplanar, multiecho pulse sequences of the brain and surrounding structures were obtained according to standard protocol without and with intravenous contrast  Contrast: 14mL MULTIHANCE GADOBENATE DIMEGLUMINE 529 MG/ML IV SOLN  Comparison:  CT 10/07/2012.  MRI 01/20/2012.  Findings: Stereotactic radiosurgery protocol at 3 Tesla.  Enhancing mass lesion in the left frontal lobe over the convexity measures 18 x 25 x 24mm.  This shows evidence of prior hemorrhage. There is irregular enhancement of the mass.  There is a small enhancing satellite lesion proximally 6 mm inferior to the larger lesion.  There is moderate vasogenic edema and mass effect around the mass.  The left lateral ventricle is depressed due to edema from above.  No other enhancing mass lesion is identified.  Generalized atrophy  with mild chronic microvascular ischemia.  No acute infarct.  IMPRESSION: 18 x 25 x 24 mm enhancing mass in the high left frontal lobe compatible with metastatic disease.  There is evidence of prior tumor hemorrhage and there is moderate surrounding white matter edema.  There is a small satellite enhancing lesion deep to the larger lesion also compatible with metastatic disease.  No other enhancing lesions identified.   Original Report Authenticated By: Janeece Riggers, M.D.   ASSESSMENT AND PLAN: This is a very pleasant 79 years old white male with metastatic non-small cell lung cancer, squamous cell carcinoma recently diagnosed with 3 cycles of chemotherapy was carboplatin and gemcitabine but unfortunately has evidence  for disease progression. I discussed the scan results with the patient and his wife. I recommended for him to discontinue treatment with carboplatin and gemcitabine. I discussed with the patient other treatment options including chemotherapy with single agent docetaxel 75 mg/M2 every 3 weeks with Neulasta support. I discussed with the patient adverse effect of this treatment including but not limited to alopecia, myelosuppression, nausea and vomiting, peripheral neuropathy, liver or renal dysfunction. For gastroparesis, I will start the patient on Reglan 10 mg by mouth every 6 hours. The patient will start the first cycle of this treatment today and he would come back for followup visit in 3 weeks with the next cycle of his chemotherapy. He was advised to call immediately if he has any concerning symptoms in the interval.  The patient voices understanding of current disease status and treatment options and is in agreement with the current care plan.  All questions were answered. The patient knows to call the clinic with any problems, questions or concerns. We can certainly see the patient much sooner if necessary.  I spent 15 minutes counseling the patient face to face. The total time spent in the appointment was 25 minutes.

## 2012-10-24 NOTE — Telephone Encounter (Signed)
gv and printed appt sched and avs fo rpt....pt did not want to come the week of labor day going to be out of town

## 2012-10-24 NOTE — Patient Instructions (Addendum)
Decadron 4mg  tablet administration instructions:  Take 8mg  (two tablets) of decadron twice daily the day before, the day of, and day after chemotherapy.  Continue to take decadron (2mg  tablets) as ordered by Dr Basilio Cairo on all other days until it is completed.    Rx for decadron 4mg  tablets that is taken for chemotherapy will be called into [facility] on [address].   CURRENT THERAPY:  1) Docetaxel 75 mg/M2 every 3 weeks with Neulasta support. First dose today 10/24/2012.  2) Xgeva 120 mg subcutaneously every 4 weeks. Status post fiirst dose given on 08/02/2012.  CHEMOTHERAPY INTENT: Palliative  CURRENT # OF CHEMOTHERAPY CYCLES: 1  CURRENT ANTIEMETICS: Zofran, dexamethasone and Compazine  CURRENT SMOKING STATUS: Former smoker quit 01/15/2012  ORAL CHEMOTHERAPY AND CONSENT: None  CURRENT BISPHOSPHONATES USE: None  PAIN MANAGEMENT: 0/10  NARCOTICS INDUCED CONSTIPATION: None  LIVING WILL AND CODE STATUS: Has living will. Initial full code but not prolonged resuscitation.

## 2012-10-25 ENCOUNTER — Telehealth: Payer: Self-pay | Admitting: *Deleted

## 2012-10-25 ENCOUNTER — Ambulatory Visit (HOSPITAL_BASED_OUTPATIENT_CLINIC_OR_DEPARTMENT_OTHER): Payer: Medicare Other

## 2012-10-25 DIAGNOSIS — C3410 Malignant neoplasm of upper lobe, unspecified bronchus or lung: Secondary | ICD-10-CM

## 2012-10-25 DIAGNOSIS — C7951 Secondary malignant neoplasm of bone: Secondary | ICD-10-CM

## 2012-10-25 MED ORDER — PEGFILGRASTIM INJECTION 6 MG/0.6ML
6.0000 mg | Freq: Once | SUBCUTANEOUS | Status: AC
  Administered 2012-10-25: 6 mg via SUBCUTANEOUS
  Filled 2012-10-25: qty 0.6

## 2012-10-25 NOTE — Patient Instructions (Addendum)

## 2012-10-25 NOTE — Telephone Encounter (Signed)
Message copied by Augusto Garbe on Tue Oct 25, 2012  4:49 PM ------      Message from: Normajean Baxter LE      Created: Tue Oct 25, 2012  7:17 AM      Regarding: Chemo f/u call       First time [facility], Dr. Donnald Garre, TB, RN      Has injection appt 8/19 at 1215 pm. ------

## 2012-10-25 NOTE — Progress Notes (Signed)
  Radiation Oncology         [phone] ________________________________  Name: [facility] MRN: [id number]  Date: 10/21/2012  DOB: 1931-04-20  End of Treatment Note  Diagnosis:   Metastatic squamous cell carcinoma, consistent with lung primary, with Brain Metastases    Indication for treatment:  palliative       Radiation treatment dates:  10/21/2012  Site/dose/Beams/energy:   Joel Gibson received stereotactic radiosurgery to the following targets with FFF photons:   Left frontal 25mm target was treated using 4 Rapid Arc VMAT Beams to a prescription dose of 18 Gy. ExacTrac Snap verification was performed for each couch angle.   Left 6mm target was treated using the same 4 Rapid Arc VMAT Beams as the adjacent left frontal 25mm lesion to a prescription dose of 20 Gy. ExacTrac Snap verification was performed for each couch angle.   Narrative: The patient tolerated radiation treatment relatively well.     Plan: The patient has completed radiation treatment. The patient will return to radiation oncology clinic for routine followup in one month. I advised them to call or return sooner if they have any questions or concerns related to their recovery or treatment.  Dexamethasone taper:  Take 2 tablets three times a day through 10/28/12  On 8/23 start taking 2 tablets at breakfast and lunch only  On 9/6 start taking 1 tablet at breakfast and lunch only  On 9/13 start taking 1 tablet at breakfast only  Stop taking dexamethasone on 11/26/12.  -----------------------------------  Lonie Peak, MD

## 2012-10-25 NOTE — Telephone Encounter (Signed)
Called [facility] Gibson for chemotherapy F/U.  On speaker phone with Britta Mccreedy.  Patient is doing well.  Reports tingling to bottom of feet when not walking but this went away last night.  "Eating too much."  Reports overall he feels good.  Denies n/v.  Denies any new side effects or symptoms.  Bowel and bladder is functioning well.  Eating and drinking well and I instructed to drink 64 oz minimum daily or at least the day before, of and after treatment.  Denies questions at this time and encouraged to call if needed.  Reviewed how to call after hours in the case of an emergency.

## 2012-10-29 ENCOUNTER — Encounter (HOSPITAL_COMMUNITY): Payer: Self-pay | Admitting: Emergency Medicine

## 2012-10-29 ENCOUNTER — Emergency Department (HOSPITAL_COMMUNITY): Payer: Medicare Other

## 2012-10-29 ENCOUNTER — Inpatient Hospital Stay (HOSPITAL_COMMUNITY)
Admission: EM | Admit: 2012-10-29 | Discharge: 2012-11-01 | DRG: 157 | Disposition: A | Discharge status: Home / Self Care | Payer: Medicare Other | Attending: Internal Medicine | Admitting: Internal Medicine

## 2012-10-29 DIAGNOSIS — T451X5A Adverse effect of antineoplastic and immunosuppressive drugs, initial encounter: Secondary | ICD-10-CM | POA: Diagnosis present

## 2012-10-29 DIAGNOSIS — K219 Gastro-esophageal reflux disease without esophagitis: Secondary | ICD-10-CM | POA: Diagnosis present

## 2012-10-29 DIAGNOSIS — C3490 Malignant neoplasm of unspecified part of unspecified bronchus or lung: Secondary | ICD-10-CM | POA: Diagnosis present

## 2012-10-29 DIAGNOSIS — J449 Chronic obstructive pulmonary disease, unspecified: Secondary | ICD-10-CM | POA: Diagnosis present

## 2012-10-29 DIAGNOSIS — Z800 Family history of malignant neoplasm of digestive organs: Secondary | ICD-10-CM

## 2012-10-29 DIAGNOSIS — K759 Inflammatory liver disease, unspecified: Secondary | ICD-10-CM

## 2012-10-29 DIAGNOSIS — E785 Hyperlipidemia, unspecified: Secondary | ICD-10-CM | POA: Diagnosis present

## 2012-10-29 DIAGNOSIS — Z923 Personal history of irradiation: Secondary | ICD-10-CM

## 2012-10-29 DIAGNOSIS — D72819 Decreased white blood cell count, unspecified: Secondary | ICD-10-CM

## 2012-10-29 DIAGNOSIS — H409 Unspecified glaucoma: Secondary | ICD-10-CM | POA: Diagnosis present

## 2012-10-29 DIAGNOSIS — Z8673 Personal history of transient ischemic attack (TIA), and cerebral infarction without residual deficits: Secondary | ICD-10-CM

## 2012-10-29 DIAGNOSIS — D638 Anemia in other chronic diseases classified elsewhere: Secondary | ICD-10-CM | POA: Diagnosis present

## 2012-10-29 DIAGNOSIS — K122 Cellulitis and abscess of mouth: Secondary | ICD-10-CM

## 2012-10-29 DIAGNOSIS — Z9221 Personal history of antineoplastic chemotherapy: Secondary | ICD-10-CM

## 2012-10-29 DIAGNOSIS — C801 Malignant (primary) neoplasm, unspecified: Secondary | ICD-10-CM

## 2012-10-29 DIAGNOSIS — K208 Other esophagitis without bleeding: Secondary | ICD-10-CM | POA: Diagnosis present

## 2012-10-29 DIAGNOSIS — Z720 Tobacco use: Secondary | ICD-10-CM

## 2012-10-29 DIAGNOSIS — Z87891 Personal history of nicotine dependence: Secondary | ICD-10-CM

## 2012-10-29 DIAGNOSIS — J0510 Acute epiglottitis without obstruction: Secondary | ICD-10-CM | POA: Diagnosis present

## 2012-10-29 DIAGNOSIS — I350 Nonrheumatic aortic (valve) stenosis: Secondary | ICD-10-CM

## 2012-10-29 DIAGNOSIS — D61810 Antineoplastic chemotherapy induced pancytopenia: Secondary | ICD-10-CM | POA: Diagnosis present

## 2012-10-29 DIAGNOSIS — R918 Other nonspecific abnormal finding of lung field: Secondary | ICD-10-CM

## 2012-10-29 DIAGNOSIS — I10 Essential (primary) hypertension: Secondary | ICD-10-CM | POA: Diagnosis present

## 2012-10-29 DIAGNOSIS — I359 Nonrheumatic aortic valve disorder, unspecified: Secondary | ICD-10-CM | POA: Diagnosis present

## 2012-10-29 DIAGNOSIS — C7951 Secondary malignant neoplasm of bone: Secondary | ICD-10-CM | POA: Diagnosis present

## 2012-10-29 DIAGNOSIS — T380X5A Adverse effect of glucocorticoids and synthetic analogues, initial encounter: Secondary | ICD-10-CM | POA: Diagnosis present

## 2012-10-29 DIAGNOSIS — J4489 Other specified chronic obstructive pulmonary disease: Secondary | ICD-10-CM | POA: Diagnosis present

## 2012-10-29 DIAGNOSIS — C7931 Secondary malignant neoplasm of brain: Secondary | ICD-10-CM | POA: Diagnosis present

## 2012-10-29 DIAGNOSIS — B370 Candidal stomatitis: Principal | ICD-10-CM | POA: Diagnosis present

## 2012-10-29 DIAGNOSIS — I639 Cerebral infarction, unspecified: Secondary | ICD-10-CM

## 2012-10-29 DIAGNOSIS — J029 Acute pharyngitis, unspecified: Secondary | ICD-10-CM | POA: Diagnosis present

## 2012-10-29 LAB — CBC WITH DIFFERENTIAL/PLATELET
Eosinophils Relative: 2 % (ref 0–5)
HCT: 37.3 % — ABNORMAL LOW (ref 39.0–52.0)
Lymphs Abs: 0.2 10*3/uL — ABNORMAL LOW (ref 0.7–4.0)
MCV: 101.4 fL — ABNORMAL HIGH (ref 78.0–100.0)
Monocytes Relative: 6 % (ref 3–12)
Neutro Abs: 2 10*3/uL (ref 1.7–7.7)
RBC: 3.68 MIL/uL — ABNORMAL LOW (ref 4.22–5.81)
WBC Morphology: INCREASED
WBC: 2.6 10*3/uL — ABNORMAL LOW (ref 4.0–10.5)

## 2012-10-29 LAB — COMPREHENSIVE METABOLIC PANEL
BUN: 34 mg/dL — ABNORMAL HIGH (ref 6–23)
CO2: 27 mEq/L (ref 19–32)
Chloride: 106 mEq/L (ref 96–112)
Creatinine, Ser: 1.01 mg/dL (ref 0.50–1.35)
GFR calc Af Amer: 79 mL/min — ABNORMAL LOW (ref >90)
GFR calc non Af Amer: 69 mL/min — ABNORMAL LOW (ref >90)
Glucose, Bld: 96 mg/dL (ref 70–99)
Total Bilirubin: 0.6 mg/dL (ref 0.3–1.2)

## 2012-10-29 LAB — PHOSPHORUS: Phosphorus: 3 mg/dL (ref 2.3–4.6)

## 2012-10-29 LAB — MAGNESIUM: Magnesium: 1.8 mg/dL (ref 1.5–2.5)

## 2012-10-29 MED ORDER — SODIUM CHLORIDE 0.9 % IV SOLN
3.0000 g | Freq: Four times a day (QID) | INTRAVENOUS | Status: DC
  Administered 2012-10-29 – 2012-11-01 (×11): 3 g via INTRAVENOUS
  Filled 2012-10-29 (×14): qty 3

## 2012-10-29 MED ORDER — PROCHLORPERAZINE MALEATE 10 MG PO TABS
10.0000 mg | ORAL_TABLET | Freq: Four times a day (QID) | ORAL | Status: DC | PRN
  Filled 2012-10-29: qty 1

## 2012-10-29 MED ORDER — ASPIRIN EC 81 MG PO TBEC
81.0000 mg | DELAYED_RELEASE_TABLET | ORAL | Status: DC
  Administered 2012-10-30 – 2012-11-01 (×2): 81 mg via ORAL
  Filled 2012-10-29 (×2): qty 1

## 2012-10-29 MED ORDER — MORPHINE SULFATE ER 15 MG PO TBCR
15.0000 mg | EXTENDED_RELEASE_TABLET | Freq: Two times a day (BID) | ORAL | Status: DC
  Filled 2012-10-29: qty 1

## 2012-10-29 MED ORDER — ACETAMINOPHEN 325 MG PO TABS: 650.0000 mg | ORAL_TABLET | Freq: Four times a day (QID) | ORAL | Status: DC | PRN

## 2012-10-29 MED ORDER — LORATADINE 10 MG PO TABS
10.0000 mg | ORAL_TABLET | Freq: Every day | ORAL | Status: DC
  Administered 2012-10-30 – 2012-11-01 (×3): 10 mg via ORAL
  Filled 2012-10-29 (×3): qty 1

## 2012-10-29 MED ORDER — ATORVASTATIN CALCIUM 40 MG PO TABS
40.0000 mg | ORAL_TABLET | Freq: Every day | ORAL | Status: DC
  Administered 2012-10-29 – 2012-10-31 (×3): 40 mg via ORAL
  Filled 2012-10-29 (×4): qty 1

## 2012-10-29 MED ORDER — TEMAZEPAM 15 MG PO CAPS
15.0000 mg | ORAL_CAPSULE | Freq: Every evening | ORAL | Status: DC | PRN
  Administered 2012-10-29: 15 mg via ORAL
  Filled 2012-10-29: qty 1

## 2012-10-29 MED ORDER — OXYCODONE HCL 5 MG PO TABS: 5.0000 mg | ORAL_TABLET | ORAL | Status: DC | PRN

## 2012-10-29 MED ORDER — ALBUTEROL SULFATE HFA 108 (90 BASE) MCG/ACT IN AERS
2.0000 | INHALATION_SPRAY | RESPIRATORY_TRACT | Status: DC | PRN
  Filled 2012-10-29: qty 6.7

## 2012-10-29 MED ORDER — ASPIRIN-DIPYRIDAMOLE ER 25-200 MG PO CP12
1.0000 | ORAL_CAPSULE | Freq: Every day | ORAL | Status: DC
  Administered 2012-10-29 – 2012-10-31 (×3): 1 via ORAL
  Filled 2012-10-29 (×4): qty 1

## 2012-10-29 MED ORDER — DIPHENHYDRAMINE HCL 50 MG/ML IJ SOLN
12.5000 mg | Freq: Four times a day (QID) | INTRAMUSCULAR | Status: DC | PRN
  Filled 2012-10-29: qty 1

## 2012-10-29 MED ORDER — DEXAMETHASONE 4 MG PO TABS: 4.0000 mg | ORAL_TABLET | Freq: Two times a day (BID) | ORAL | Status: DC

## 2012-10-29 MED ORDER — NYSTATIN 100000 UNIT/ML MT SUSP
5.0000 mL | Freq: Four times a day (QID) | OROMUCOSAL | Status: DC
  Administered 2012-10-29 – 2012-11-01 (×12): 500000 [IU] via ORAL
  Filled 2012-10-29 (×16): qty 5

## 2012-10-29 MED ORDER — MAGIC MOUTHWASH W/LIDOCAINE
5.0000 mL | Freq: Four times a day (QID) | ORAL | Status: DC
  Administered 2012-10-29 – 2012-11-01 (×11): 5 mL via ORAL
  Filled 2012-10-29 (×15): qty 5

## 2012-10-29 MED ORDER — ONDANSETRON HCL 4 MG/2ML IJ SOLN: 4.0000 mg | Freq: Four times a day (QID) | INTRAMUSCULAR | Status: DC | PRN

## 2012-10-29 MED ORDER — BETAXOLOL HCL 0.25 % OP SUSP
1.0000 [drp] | Freq: Two times a day (BID) | OPHTHALMIC | Status: DC
  Administered 2012-10-29 – 2012-11-01 (×6): 1 [drp] via OPHTHALMIC
  Filled 2012-10-29: qty 10

## 2012-10-29 MED ORDER — DEXAMETHASONE 4 MG PO TABS
4.0000 mg | ORAL_TABLET | Freq: Every day | ORAL | Status: DC
  Administered 2012-10-29 – 2012-10-30 (×2): 4 mg via ORAL
  Filled 2012-10-29 (×3): qty 1

## 2012-10-29 MED ORDER — METOCLOPRAMIDE HCL 10 MG PO TABS
10.0000 mg | ORAL_TABLET | Freq: Three times a day (TID) | ORAL | Status: DC
  Administered 2012-10-29 – 2012-10-31 (×4): 10 mg via ORAL
  Filled 2012-10-29 (×16): qty 1

## 2012-10-29 MED ORDER — SODIUM CHLORIDE 0.9 % IV SOLN
INTRAVENOUS | Status: DC
  Administered 2012-10-29 – 2012-10-31 (×2): via INTRAVENOUS

## 2012-10-29 MED ORDER — HYDROMORPHONE HCL PF 2 MG/ML IJ SOLN: 2.0000 mg | INTRAMUSCULAR | Status: DC | PRN

## 2012-10-29 MED ORDER — AMLODIPINE BESYLATE 2.5 MG PO TABS
2.5000 mg | ORAL_TABLET | Freq: Every day | ORAL | Status: DC
  Administered 2012-10-30 – 2012-11-01 (×3): 2.5 mg via ORAL
  Filled 2012-10-29 (×3): qty 1

## 2012-10-29 MED ORDER — TIOTROPIUM BROMIDE MONOHYDRATE 18 MCG IN CAPS
18.0000 ug | ORAL_CAPSULE | Freq: Every day | RESPIRATORY_TRACT | Status: DC
  Administered 2012-10-30 – 2012-11-01 (×3): 18 ug via RESPIRATORY_TRACT
  Filled 2012-10-29: qty 5

## 2012-10-29 MED ORDER — SODIUM CHLORIDE 0.9 % IV SOLN
3.0000 g | Freq: Once | INTRAVENOUS | Status: AC
  Administered 2012-10-29: 3 g via INTRAVENOUS
  Filled 2012-10-29: qty 3

## 2012-10-29 MED ORDER — SODIUM CHLORIDE 0.9 % IJ SOLN
3.0000 mL | Freq: Two times a day (BID) | INTRAMUSCULAR | Status: DC
  Administered 2012-10-30 – 2012-10-31 (×2): 3 mL via INTRAVENOUS

## 2012-10-29 MED ORDER — ADULT MULTIVITAMIN W/MINERALS CH
1.0000 | ORAL_TABLET | Freq: Every day | ORAL | Status: DC
  Administered 2012-10-29 – 2012-11-01 (×4): 1 via ORAL
  Filled 2012-10-29 (×4): qty 1

## 2012-10-29 MED ORDER — ACETAMINOPHEN 650 MG RE SUPP: 650.0000 mg | Freq: Four times a day (QID) | RECTAL | Status: DC | PRN

## 2012-10-29 MED ORDER — ASPIRIN-DIPYRIDAMOLE ER 25-200 MG PO CP12
1.0000 | ORAL_CAPSULE | ORAL | Status: DC
  Administered 2012-10-30 – 2012-11-01 (×2): 1 via ORAL
  Filled 2012-10-29 (×2): qty 1

## 2012-10-29 MED ORDER — DIPHENHYDRAMINE HCL 50 MG/ML IJ SOLN
25.0000 mg | Freq: Once | INTRAMUSCULAR | Status: AC
  Administered 2012-10-29: 25 mg via INTRAVENOUS
  Filled 2012-10-29: qty 1

## 2012-10-29 MED ORDER — ONDANSETRON HCL 4 MG PO TABS: 4.0000 mg | ORAL_TABLET | Freq: Four times a day (QID) | ORAL | Status: DC | PRN

## 2012-10-29 MED ORDER — PANTOPRAZOLE SODIUM 40 MG PO TBEC
40.0000 mg | DELAYED_RELEASE_TABLET | Freq: Every day | ORAL | Status: DC
  Administered 2012-10-31 – 2012-11-01 (×2): 40 mg via ORAL
  Filled 2012-10-29 (×3): qty 1

## 2012-10-29 MED ORDER — FLUCONAZOLE IN SODIUM CHLORIDE 200-0.9 MG/100ML-% IV SOLN
200.0000 mg | INTRAVENOUS | Status: DC
  Administered 2012-10-29 – 2012-10-31 (×3): 200 mg via INTRAVENOUS
  Filled 2012-10-29 (×4): qty 100

## 2012-10-29 MED ORDER — IRBESARTAN 300 MG PO TABS
300.0000 mg | ORAL_TABLET | Freq: Every day | ORAL | Status: DC
  Administered 2012-10-30 – 2012-11-01 (×3): 300 mg via ORAL
  Filled 2012-10-29 (×3): qty 1

## 2012-10-29 NOTE — ED Notes (Signed)
Pt states that he started a new medication last night (reglan).  Started breaking out yesterday and this morning states that his throat feels tight.  Airway patent at this time.

## 2012-10-29 NOTE — ED Provider Notes (Signed)
Medical screening examination/treatment/procedure(s) were conducted as a shared visit with non-physician practitioner(s) and myself.  I personally evaluated the patient during the encounter   Joya Gaskins, MD 10/29/12 1534

## 2012-10-29 NOTE — ED Provider Notes (Signed)
I spoke to Dr Jearld Fenton with ENT He requests I admit to medicine.  Requests I start IV antibiotics.  He reports he can be consulted in the hospital by hospitalist  Joya Gaskins, MD 10/29/12 [phone]

## 2012-10-29 NOTE — ED Notes (Signed)
Dr. Wickline at bedside.  

## 2012-10-29 NOTE — ED Provider Notes (Signed)
Patient seen/examined in the Emergency Department in conjunction with Midlevel Provider Anne Shutter Patient reports concern for allergic rxn Exam : uvula edematous and erythematous.  ?thrush.  No stridor.  He is handling secretions.  No tongue or lip swelling Plan: soft tissue xray, cbc.  Will treat for potential uvulitis in the setting of chronic steroid use and chemotherapy due to cancer.  Pt is well appearing and suspect he will be amenable for d/c home.   Joya Gaskins, MD 10/29/12 1106

## 2012-10-29 NOTE — H&P (Addendum)
[facility] History and Physical  Joel Gibson:[id number] DOB: November 24, 1931 DOA: 10/29/2012  Referring physician: ER physician PCP: [name], MD   Chief Complaint: sore throat  HPI:  79 year old male with past medical history of non-small cell lung cancer on chemotherapy (under Dr. Asa Lente care), brain and osseous metastasis status post radiation to right hip and sacrum and SRS to multiple brain lesions (on steroids at home), hypertension, dyslipidemia who presented to [facility] ED with complaints of ongoing sore throat and difficulty swallowing for past few days prior to this admission. Patient did not have associated difficulty breathing, shortness of breath or stridor. No fever or chills at home. No neck swelling. No nausa or vomiting. Pt reported rash on his face for about 1 day but no other area with rash and no itching or erythema.  In ED, vital signs were stable with BP was 134/70, HR 83, T 97.8 F. CBC revealed neutropenia with WBC count of 2.6, hemoglobin of 12.2 and platelet count of 80. No BMP on file. Neck x ray revealed mild soft tissue swelling involving the epiglottis and area around it. ENT was consulted by ED physician.  Assessment and Plan:  Principal Problem:   Oral thrush - related to steroids and/or recent chemotherapy - will start fluconazole 200 mg IV daily, nystatin suspension 4 x day and magic mouthwash with lidocaine - appreciate ENT consult Active Problems:   Acute pharyngitis - will continue Unasyn started in ED   Pancytopenia - related to recent chemotherapy - continue to monitor cell count   Hyperlipidemia - continue statin therapy   Hypertension - continue Norvasc 2.5 mg daily and avapro 300 mg daily   Non small cell lung cancer with brain and osseous metastases - on chemotherapy - s/p palliative radiotherapy to right hip and sacrum - s/p SRS to brain completed 10/21/2012    COPD (chronic obstructive pulmonary disease) - stable -  continue spiriva   GERD (gastroesophageal reflux disease) - protonix 40 mg daily  Joel Gibson [facility] [phone]   Review of Systems:  Constitutional: Negative for fever, chills and malaise/fatigue. Negative for diaphoresis.  HENT: per HPI.   Eyes: Negative for blurred vision, double vision, photophobia, pain, discharge and redness.  Respiratory: Negative for cough, hemoptysis, sputum production, shortness of breath, wheezing and stridor.   Cardiovascular: Negative for chest pain, palpitations, orthopnea, claudication and leg swelling.  Gastrointestinal: Negative for nausea, vomiting and abdominal pain. Negative for heartburn, constipation, blood in stool and melena.  Genitourinary: Negative for dysuria, urgency, frequency, hematuria and flank pain.  Musculoskeletal: Negative for myalgias, back pain, joint pain and falls.  Skin: Negative for itching and rash.  Neurological: Negative for dizziness and weakness. Negative for tingling, tremors, sensory change, speech change, focal weakness, loss of consciousness and headaches.  Endo/Heme/Allergies: Negative for environmental allergies and polydipsia. Does not bruise/bleed easily.  Psychiatric/Behavioral: Negative for suicidal ideas. The patient is not nervous/anxious.      Past Medical History  Diagnosis Date  . Hyperlipidemia   . Hypertension   . Aortic stenosis     mild to moderate  . Gastritis   . COPD (chronic obstructive pulmonary disease)   . Bronchitis   . Hepatitis     possible  . Hx of cataract surgery     lt eye  . Tobacco abuse   . Shortness of breath     with activity  . Retinal vein occlusion 1990  . GERD (gastroesophageal reflux disease)   . Hemorrhoid   .  Aortic stenosis   . Hypertension   . Dyslipidemia   . Glaucoma   . Right rib fracture      Pathological Fracture / 4 weeks ago  . Lung mass     Right Upper Lobe - Lobular Mass  . SOB (shortness of breath)     Mild  . Cough   . Stroke 2002    Right slight  difference in vision  . S/P radiation therapy 02/22/12 - 03/07/12    Pelvis/Sacrum/30 Gray/10 Fractions and Left Scapula/Rib/ 8 Gray/1 Fraction   . Status post chemotherapy      Carboplatin/ Abraxane  . On antineoplastic chemotherapy     Gemcitabine and Xgeva  . Lung cancer 02/02/2012  . Bone metastases     Widespread/ Lytic Lesion Left 7t Rib, Left Iliac Pelvis, ribs and scapula   Past Surgical History  Procedure Laterality Date  . Vasectomy    . Tonsillectomy    . Leg surgery      rt for nerve impingement  . Collarbone fracture      1977  . Tonsillectomy    . Eye surgery      catarct bil  . Portacath placement  02/08/2012    Procedure: INSERTION PORT-A-CATH;  Surgeon: [name], MD;  Location: [facility] OR;  Service: Thoracic;  Laterality: Right;  . Ct guided core biopsy  01/26/12    Left Lytic Lesion - Metastatic Squamous Cell Carcinoma  . Skin biopsy  02/09/12    Right Upper Back, shave : Melanoma In Situ Arising  From a Dysplastic Nevus   Social History:  reports that he quit smoking about 9 months ago. His smoking use included Cigarettes. He has a 130 pack-year smoking history. He has never used smokeless tobacco. He reports that  drinks alcohol. He reports that he does not use illicit drugs.  No Known Allergies  Family History  Problem Relation Age of Onset  . Heart attack Father     x7  . Throat cancer Father     Deceased from Staph Infection  . Heart attack Father     7 Times  . Leukemia Mother      Prior to Admission medications   Medication Sig Start Date End Date Taking? Authorizing Provider  albuterol (PROVENTIL HFA;VENTOLIN HFA) 108 (90 BASE) MCG/ACT inhaler Inhale 2 puffs into the lungs every 4 (four) hours as needed.   Yes Historical Provider, MD  amLODipine (NORVASC) 2.5 MG tablet Take 1 tablet (2.5 mg total) by mouth daily. 10/13/12  Yes Peter M [place], MD  aspirin EC 81 MG tablet Take 81 mg by mouth every other day. Takes along with Aggrenox   Yes  Historical Provider, MD  Aspirin-Dipyridamole (AGGRENOX PO) Take 25-200 mg by mouth 2 (two) times daily. Takes 2 tabs one day alt. with 1 tab next day,etc.   Yes Historical Provider, MD  betaxolol (BETOPTIC-S) 0.25 % ophthalmic suspension Place 1 drop into both eyes daily. One drop in the left eye in the am....one drop in the right eye in the am and in the pm.   Yes Historical Provider, MD  Calcium Carbonate (CALCIUM 600 PO) Take 600 mg by mouth 2 (two) times daily.   Yes Historical Provider, MD  dexamethasone (DECADRON) 2 MG tablet take 2 tablets by mouth 3 times a day with food and taper as directed 10/21/12  Yes Lonie Peak, MD  dexamethasone (DECADRON) 4 MG tablet Take 1 tablet (4 mg total) by mouth as directed. 2  tablets BID the day before, day of, and day after chemotherapy 10/24/12  Yes Si Gaul, MD  HYDROcodone-homatropine [facility]) 5-1.5 MG/5ML syrup Take 5 mLs by mouth every 6 (six) hours as needed for cough.   Yes Historical Provider, MD  irbesartan (AVAPRO) 300 MG tablet Take 300 mg by mouth daily.    Yes Historical Provider, MD  lidocaine-prilocaine (EMLA) cream Apply 1 application topically as needed. 02/02/12  Yes Si Gaul, MD  loratadine (CLARITIN) 10 MG tablet Take 10 mg by mouth daily.   Yes Historical Provider, MD  metoCLOPramide (REGLAN) 10 MG tablet Take 1 tablet (10 mg total) by mouth 4 (four) times daily. 10/24/12  Yes Si Gaul, MD  morphine (MS CONTIN) 15 MG 12 hr tablet Take 1 tablet (15 mg total) by mouth 2 (two) times daily. 10/03/12  Yes Conni Slipper, PA-C  Multiple Vitamin (MULTIVITAMIN PO) Take 1 tablet by mouth at bedtime.    Yes Historical Provider, MD  Nutritional Supplements (EQUATE PO) Take by mouth. Daily   Yes Historical Provider, MD  omeprazole (PRILOSEC) 20 MG capsule Take 20 mg by mouth daily. Per Dr. Virgel Manifold   Yes Historical Provider, MD  oxyCODONE (OXY IR/ROXICODONE) 5 MG immediate release tablet Take 1 tablet (5 mg total) by mouth every 4  (four) hours as needed for pain. 08/22/12  Yes Lonie Peak, MD  prochlorperazine (COMPAZINE) 10 MG tablet Take 10 mg by mouth every 6 (six) hours as needed. For nausea 02/02/12  Yes Si Gaul, MD  rosuvastatin (CRESTOR) 40 MG tablet Take 20 mg by mouth at bedtime.    Yes Historical Provider, MD  temazepam (RESTORIL) 15 MG capsule Take 1 capsule (15 mg total) by mouth at bedtime as needed for sleep. 10/03/12  Yes Adrena E Johnson, PA-C  tiotropium (SPIRIVA) 18 MCG inhalation capsule Place 18 mcg into inhaler and inhale daily.     Yes Historical Provider, MD   Physical Exam: Filed Vitals:   10/29/12 1032 10/29/12 1408  BP: 134/70 142/63  Pulse: 98 83  Temp: 97.8 F (36.6 C) 98.1 F (36.7 C)  TempSrc: Oral Oral  Resp: 17 18  Height:  5\' 7"  (1.702 m)  Weight:  65.7 kg (144 lb 13.5 oz)  SpO2: 100% 97%    Physical Exam  Constitutional: Appears well-developed and well-nourished. No distress.  HENT: Normocephalic. External right and left ear normal. Has oral thrush Eyes: Conjunctivae and EOM are normal. PERRLA, no scleral icterus.  Neck: Normal ROM. Neck supple. No JVD. No tracheal deviation. No thyromegaly.  CVS: RRR, S1/S2 +, Appreciated SEM  Pulmonary: Effort and breath sounds normal, no stridor, rhonchi, wheezes, rales.  Abdominal: Soft. BS +,  no distension, tenderness, rebound or guarding.  Musculoskeletal: Normal range of motion. No edema and no tenderness.  Lymphadenopathy: No lymphadenopathy noted, cervical, inguinal. Neuro: Alert. Normal reflexes, muscle tone coordination. No cranial nerve deficit. Skin: Skin is warm and dry. No rash noted. Not diaphoretic. No erythema. No pallor.  Psychiatric: Normal mood and affect. Behavior, judgment, thought content normal.   Labs on Admission:  Basic Metabolic Panel:  Recent Labs Lab 10/24/12 0801  NA 142  K 4.3  CO2 24  GLUCOSE 103  BUN 30.0*  CREATININE 0.9  CALCIUM 8.5   Liver Function Tests:  Recent Labs Lab  10/24/12 0801  AST 21  ALT 27  ALKPHOS 52  BILITOT 0.48  PROT 5.8*  ALBUMIN 2.9*   No results found for this basename: LIPASE, AMYLASE,  in the  last 168 hours No results found for this basename: AMMONIA,  in the last 168 hours CBC:  Recent Labs Lab 10/24/12 0801 10/29/12 1145  WBC 15.6* 2.6*  NEUTROABS 14.3* 2.0  HGB 12.5* 12.2*  HCT 37.8* 37.3*  MCV 101.3* 101.4*  PLT 159 80*   Cardiac Enzymes: No results found for this basename: CKTOTAL, CKMB, CKMBINDEX, TROPONINI,  in the last 168 hours BNP: No components found with this basename: POCBNP,  CBG: No results found for this basename: GLUCAP,  in the last 168 hours  Radiological Exams on Admission: Dg Neck Soft Tissue 10/29/2012   *  IMPRESSION: Mild soft tissue swelling involving the epiglottis, aryepiglottic folds and questionably the prevertebral soft tissues at the inferior cervical region.   Original Report Authenticated By: Ulyses Southward, M.D.    Code Status: Full Family Communication: Pt at bedside Disposition Plan: Admit for further evaluation  Joel Passey, MD  [facility] Pager [phone]  If 7PM-7AM, please contact night-coverage [email or web address] Password [facility] 10/29/2012, 2:34 PM

## 2012-10-29 NOTE — ED Provider Notes (Signed)
CSN: [id number]     Arrival date & time 10/29/12  1019 History     First MD Initiated Contact with Patient 10/29/12 1031     Chief Complaint  Patient presents with  . Allergic Reaction   (Consider location/radiation/quality/duration/timing/severity/associated sxs/prior Treatment) HPI Comments: Patient with a history of Stage IV Lung Cancer with mets to the bone and brain presents today with what he feels is an Allergic Reaction.  He states that he was started on Reglan five days ago, which he has never had previously.  He states that 2-3 days ago he developed tightness and soreness in his throat, which is progressively worsening.  He states that this morning he felt that it was difficult to swallow, but is not having any difficulty swallowing at this time.  He states that two days ago he developed a rash on his face.  He states that the rash does not itch.  He has not noticed a rash anywhere else.  He denies any shortness of breath or difficulty breathing.  He denies fever or chills.  Denies nausea or vomiting.  His last dose of Reglan was 10 PM last evening.  He has taken Claritin earlier today.  He is also currently on Decadron daily.    Patient is a 79 y.o. male presenting with allergic reaction. The history is provided by the patient.  Allergic Reaction   Past Medical History  Diagnosis Date  . Hyperlipidemia   . Hypertension   . Aortic stenosis     mild to moderate  . Gastritis   . COPD (chronic obstructive pulmonary disease)   . Bronchitis   . Hepatitis     possible  . Hx of cataract surgery     lt eye  . Tobacco abuse   . Shortness of breath     with activity  . Retinal vein occlusion 1990  . GERD (gastroesophageal reflux disease)   . Hemorrhoid   . Aortic stenosis   . Hypertension   . Dyslipidemia   . Glaucoma   . Right rib fracture      Pathological Fracture / 4 weeks ago  . Lung mass     Right Upper Lobe - Lobular Mass  . SOB (shortness of breath)     Mild  .  Cough   . Stroke 2002    Right slight difference in vision  . S/P radiation therapy 02/22/12 - 03/07/12    Pelvis/Sacrum/30 Gray/10 Fractions and Left Scapula/Rib/ 8 Gray/1 Fraction   . Status post chemotherapy      Carboplatin/ Abraxane  . On antineoplastic chemotherapy     Gemcitabine and Xgeva  . Lung cancer 02/02/2012  . Bone metastases     Widespread/ Lytic Lesion Left 7t Rib, Left Iliac Pelvis, ribs and scapula   Past Surgical History  Procedure Laterality Date  . Vasectomy    . Tonsillectomy    . Leg surgery      rt for nerve impingement  . Collarbone fracture      1977  . Tonsillectomy    . Eye surgery      catarct bil  . Portacath placement  02/08/2012    Procedure: INSERTION PORT-A-CATH;  Surgeon: [name], MD;  Location: [facility] OR;  Service: Thoracic;  Laterality: Right;  . Ct guided core biopsy  01/26/12    Left Lytic Lesion - Metastatic Squamous Cell Carcinoma  . Skin biopsy  02/09/12    Right Upper Back, shave : Melanoma In Situ  Arising  From a Dysplastic Nevus   Family History  Problem Relation Age of Onset  . Heart attack Father     x7  . Throat cancer Father     Deceased from Staph Infection  . Heart attack Father     7 Times  . Leukemia Mother    History  Substance Use Topics  . Smoking status: Former Smoker -- 2.00 packs/day for 65 years    Types: Cigarettes    Quit date: 01/15/2012  . Smokeless tobacco: Never Used  . Alcohol Use: Yes     Comment: occassional    Review of Systems  Constitutional: Negative for fever and chills.  HENT: Positive for sore throat. Negative for neck pain and neck stiffness.   Respiratory: Negative for shortness of breath.   Gastrointestinal: Negative for nausea and vomiting.  All other systems reviewed and are negative.    Allergies  Review of patient's allergies indicates no known allergies.  Home Medications   Current Outpatient Rx  Name  Route  Sig  Dispense  Refill  . albuterol (PROVENTIL  HFA;VENTOLIN HFA) 108 (90 BASE) MCG/ACT inhaler   Inhalation   Inhale 2 puffs into the lungs every 4 (four) hours as needed.         Marland Kitchen amLODipine (NORVASC) 2.5 MG tablet   Oral   Take 1 tablet (2.5 mg total) by mouth daily.   90 tablet   3   . aspirin EC 81 MG tablet   Oral   Take 81 mg by mouth every other day. Takes along with Aggrenox         . Aspirin-Dipyridamole (AGGRENOX PO)   Oral   Take 25-200 mg by mouth 2 (two) times daily. Takes 2 tabs one day alt. with 1 tab next day,etc.         . betaxolol (BETOPTIC-S) 0.25 % ophthalmic suspension   Both Eyes   Place 1 drop into both eyes daily. One drop in the left eye in the am....one drop in the right eye in the am and in the pm.         . Calcium Carbonate (CALCIUM 600 PO)   Oral   Take 600 mg by mouth 2 (two) times daily.         Marland Kitchen dexamethasone (DECADRON) 2 MG tablet      take 2 tablets by mouth 3 times a day with food and taper as directed   90 tablet   1   . dexamethasone (DECADRON) 4 MG tablet   Oral   Take 1 tablet (4 mg total) by mouth as directed. 2 tablets BID the day before, day of, and day after chemotherapy   40 tablet   1   . irbesartan (AVAPRO) 300 MG tablet   Oral   Take 300 mg by mouth daily.          Marland Kitchen lidocaine-prilocaine (EMLA) cream   Topical   Apply 1 application topically as needed.         . loratadine (CLARITIN) 10 MG tablet   Oral   Take 10 mg by mouth daily.         . metoCLOPramide (REGLAN) 10 MG tablet   Oral   Take 1 tablet (10 mg total) by mouth 4 (four) times daily.   120 tablet   0   . morphine (MS CONTIN) 15 MG 12 hr tablet   Oral   Take 1 tablet (15 mg total) by mouth 2 (  two) times daily.   60 tablet   0   . Multiple Vitamin (MULTIVITAMIN PO)   Oral   Take 1 tablet by mouth daily.           . Nutritional Supplements (EQUATE PO)   Oral   Take by mouth. Daily         . omeprazole (PRILOSEC) 20 MG capsule   Oral   Take 20 mg by mouth daily.  Per Dr. Virgel Manifold         . oxyCODONE (OXY IR/ROXICODONE) 5 MG immediate release tablet   Oral   Take 1 tablet (5 mg total) by mouth every 4 (four) hours as needed for pain.   45 tablet   0   . prochlorperazine (COMPAZINE) 10 MG tablet   Oral   Take 10 mg by mouth every 6 (six) hours as needed. For nausea         . rosuvastatin (CRESTOR) 40 MG tablet   Oral   Take 20 mg by mouth daily.          . temazepam (RESTORIL) 15 MG capsule   Oral   Take 1 capsule (15 mg total) by mouth at bedtime as needed for sleep.   30 capsule   0   . tiotropium (SPIRIVA) 18 MCG inhalation capsule   Inhalation   Place 18 mcg into inhaler and inhale daily.            BP 134/70  Pulse 98  Temp(Src) 97.8 F (36.6 C) (Oral)  Resp 17  SpO2 100% Physical Exam  Nursing note and vitals reviewed. Constitutional: He appears well-developed and well-nourished.  HENT:  Head: Normocephalic and atraumatic.  Nose: Nose normal.  Mouth/Throat: Uvula is midline. No trismus in the jaw. Edematous present. Posterior oropharyngeal erythema present. No tonsillar abscesses.  Airway patent.  Patient handling secretions well.  No swelling of the lips or tongue.   Moderate swelling and erythema of the uvula.  White patches visualized in the oropharynx.  Cardiovascular: Normal rate, regular rhythm and normal heart sounds.   Pulmonary/Chest: Effort normal. No stridor. No respiratory distress. He has wheezes. He has no rales. He exhibits no tenderness.  Wheezing of the right upper and lower lung field  Neurological: He is alert.  Skin: Skin is warm and dry.  Mild erythema of both cheeks and the forehead.  No papular rash palpated.  No rash of the trunk or extremities.    Psychiatric: He has a normal mood and affect.    ED Course   Procedures (including critical care time)  Labs Reviewed - No data to display Dg Neck Soft Tissue  10/29/2012   *RADIOLOGY REPORT*  Clinical Data: Allergic reaction, feels like throat  is closing up after taking new medications  NECK SOFT TISSUES - 1+ VIEW  Comparison: None  Findings: Mild thickening of the epiglottis and aryepiglottic folds. Question mildly prominent prevertebral soft tissues at C5-C6. Prevertebral soft tissues anterior to the C1-C4 appear normal in thickness. Airway appears patent on single lateral view. Bones appear demineralized. Central line noted.  IMPRESSION: Mild soft tissue swelling involving the epiglottis, aryepiglottic folds and questionably the prevertebral soft tissues at the inferior cervical region.   Original Report Authenticated By: Ulyses Southward, M.D.   No diagnosis found.  Patient discussed with Dr. Bebe Shaggy who also evaluated the patient.   MDM  Patient presenting with tightness and soreness of his throat, which he feels may be an allergic reaction.  On exam  no signs of respiratory distress.  Airway is patent.  No stridor.  Swelling and erythema of the uvula noted along with white patches consistent with possible thrush.  Patient given IV dose of Unasyn while in the ED and also treated with Nystatin for thrush.  Soft tissue xray results as above.   Dr. Bebe Shaggy has consulted Dr. Jearld Fenton with ENT.  Patient admitted to Hospitalist for further management and treatment.  Pascal Lux [place], PA-C 10/29/12 1329

## 2012-10-29 NOTE — Consult Note (Signed)
Reason for Consult: possible epiglottitis Referring Physician: Emergency room  Joel Gibson is an 80 y.o. male.  HPI: Patient is a 79 year old with a three-day history of increasing sore throat. This problem has worsened since the onset 3 days ago. His difficulty is primarily that he cannot hardly swallow. He has not had this problem recently. He just started a new chemotherapy agent for his metastatic cancer which is now to his brain. He has also started Reglan. He's developed a rash over his chest face and scalp since starting the chemotherapy as well as the Reglan. He has a normal voice. He has not had any breathing difficulties. No nasal obstruction or new congestion. No epistaxis. He has no history of swallowing any type of foreign body.  Past Medical History  Diagnosis Date  . Hyperlipidemia   . Hypertension   . Aortic stenosis     mild to moderate  . Gastritis   . COPD (chronic obstructive pulmonary disease)   . Bronchitis   . Hepatitis     possible  . Hx of cataract surgery     lt eye  . Tobacco abuse   . Shortness of breath     with activity  . Retinal vein occlusion 1990  . GERD (gastroesophageal reflux disease)   . Hemorrhoid   . Aortic stenosis   . Hypertension   . Dyslipidemia   . Glaucoma   . Right rib fracture      Pathological Fracture / 4 weeks ago  . Lung mass     Right Upper Lobe - Lobular Mass  . SOB (shortness of breath)     Mild  . Cough   . Stroke 2002    Right slight difference in vision  . S/P radiation therapy 02/22/12 - 03/07/12    Pelvis/Sacrum/30 Gray/10 Fractions and Left Scapula/Rib/ 8 Gray/1 Fraction   . Status post chemotherapy      Carboplatin/ Abraxane  . On antineoplastic chemotherapy     Gemcitabine and Xgeva  . Lung cancer 02/02/2012  . Bone metastases     Widespread/ Lytic Lesion Left 7t Rib, Left Iliac Pelvis, ribs and scapula    Past Surgical History  Procedure Laterality Date  . Vasectomy    . Tonsillectomy    . Leg  surgery      rt for nerve impingement  . Collarbone fracture      1977  . Tonsillectomy    . Eye surgery      catarct bil  . Portacath placement  02/08/2012    Procedure: INSERTION PORT-A-CATH;  Surgeon: [name], MD;  Location: [facility] OR;  Service: Thoracic;  Laterality: Right;  . Ct guided core biopsy  01/26/12    Left Lytic Lesion - Metastatic Squamous Cell Carcinoma  . Skin biopsy  02/09/12    Right Upper Back, shave : Melanoma In Situ Arising  From a Dysplastic Nevus    Family History  Problem Relation Age of Onset  . Heart attack Father     x7  . Throat cancer Father     Deceased from Staph Infection  . Heart attack Father     7 Times  . Leukemia Mother     Social History:  reports that he quit smoking about 9 months ago. His smoking use included Cigarettes. He has a 130 pack-year smoking history. He has never used smokeless tobacco. He reports that  drinks alcohol. He reports that he does not use illicit drugs.  Allergies: No  Known Allergies  Medications: I have reviewed the patient's current medications.  Results for orders placed during the hospital encounter of 10/29/12 (from the past 48 hour(s))  CBC WITH DIFFERENTIAL     Status: Abnormal   Collection Time    10/29/12 11:45 AM      Result Value Range   WBC 2.6 (*) 4.0 - 10.5 K/uL   RBC 3.68 (*) 4.22 - 5.81 MIL/uL   Hemoglobin 12.2 (*) 13.0 - 17.0 g/dL   HCT 16.1 (*) 09.6 - 04.5 %   MCV 101.4 (*) 78.0 - 100.0 fL   MCH 33.2  26.0 - 34.0 pg   MCHC 32.7  30.0 - 36.0 g/dL   RDW 40.9 (*) 81.1 - 91.4 %   Platelets 80 (*) 150 - 400 K/uL   Comment: SPECIMEN CHECKED FOR CLOTS     REPEATED TO VERIFY     PLATELET COUNT CONFIRMED BY SMEAR   Neutrophils Relative % 82 (*) 43 - 77 %   Lymphocytes Relative 8 (*) 12 - 46 %   Monocytes Relative 6  3 - 12 %   Eosinophils Relative 2  0 - 5 %   Basophils Relative 2 (*) 0 - 1 %   Neutro Abs 2.0  1.7 - 7.7 K/uL   Lymphs Abs 0.2 (*) 0.7 - 4.0 K/uL   Monocytes Absolute 0.2   0.1 - 1.0 K/uL   Eosinophils Absolute 0.1  0.0 - 0.7 K/uL   Basophils Absolute 0.1  0.0 - 0.1 K/uL   WBC Morphology INCREASED BANDS (>20% BANDS)      Dg Neck Soft Tissue  10/29/2012   *RADIOLOGY REPORT*  Clinical Data: Allergic reaction, feels like throat is closing up after taking new medications  NECK SOFT TISSUES - 1+ VIEW  Comparison: None  Findings: Mild thickening of the epiglottis and aryepiglottic folds. Question mildly prominent prevertebral soft tissues at C5-C6. Prevertebral soft tissues anterior to the C1-C4 appear normal in thickness. Airway appears patent on single lateral view. Bones appear demineralized. Central line noted.  IMPRESSION: Mild soft tissue swelling involving the epiglottis, aryepiglottic folds and questionably the prevertebral soft tissues at the inferior cervical region.   Original Report Authenticated By: Ulyses Southward, M.D.    ROS Blood pressure 134/70, pulse 98, temperature 97.8 F (36.6 C), temperature source Oral, resp. rate 17, SpO2 100.00%. Physical Exam  Constitutional: He appears well-developed and well-nourished.  HENT:  Alert and awake. He doesn't have any stridor. His voice sounds normal. Oral cavity/oropharynx-he has erythema of the palate as well as some exudate material that could be consistent with thrush. There is no excessive swelling. Posterior pharyngeal wall looks erythematous but not edematous. Fiberoptic exam: Nasopharynx has began some erythema but no swelling. The hypopharynx is clear but again diffuse erythema of the mucosa. The larynx the epiglottis is erythematous and just slightly tilted to the right but there is no thick edema or classic epiglottitis appearance. He still has its normal shape and on for. The vocal cords both move normally and there is no excessive swelling or lesions. Subglottis looks normal. Arytenoids have slight edema and slight erythema but no obstruction. Neck is without adenopathy or masses  Neck: Normal range of motion.  Neck supple.    Assessment/Plan: Pharyngitis/thrush/epiglottitis-he has a definite erythema diffusely throughout his aerodigestive tract. There is no classic evidence of epiglottitis even though it does have some erythema like the rest of the mucosa. It also looks like he has areas on his soft palate  as well as pharynx that are consistent with thrush. He already is taking steroids and probably is having some type of allergic reaction but may also be having a bacterial infection. Certainly possible reaction to the chemotherapy agent is her main. I would recommend a medicine consult for possible admission to make sure his symptoms are directed toward improvement rather than worsening as they have over the last 3 days.  Suzanna Obey 10/29/2012, 1:07 PM

## 2012-10-30 DIAGNOSIS — J029 Acute pharyngitis, unspecified: Secondary | ICD-10-CM

## 2012-10-30 DIAGNOSIS — D638 Anemia in other chronic diseases classified elsewhere: Secondary | ICD-10-CM

## 2012-10-30 LAB — CBC
Hemoglobin: 10.6 g/dL — ABNORMAL LOW (ref 13.0–17.0)
MCH: 33 pg (ref 26.0–34.0)
MCV: 101.2 fL — ABNORMAL HIGH (ref 78.0–100.0)
RBC: 3.21 MIL/uL — ABNORMAL LOW (ref 4.22–5.81)

## 2012-10-30 LAB — COMPREHENSIVE METABOLIC PANEL
AST: 17 U/L (ref 0–37)
Albumin: 2.4 g/dL — ABNORMAL LOW (ref 3.5–5.2)
Calcium: 7.9 mg/dL — ABNORMAL LOW (ref 8.4–10.5)
Chloride: 108 mEq/L (ref 96–112)
Creatinine, Ser: 0.91 mg/dL (ref 0.50–1.35)
Total Bilirubin: 0.3 mg/dL (ref 0.3–1.2)

## 2012-10-30 MED ORDER — DEXAMETHASONE 4 MG PO TABS
4.0000 mg | ORAL_TABLET | Freq: Two times a day (BID) | ORAL | Status: DC
  Administered 2012-10-30 – 2012-11-01 (×5): 4 mg via ORAL
  Filled 2012-10-30 (×7): qty 1

## 2012-10-30 MED ORDER — MORPHINE SULFATE ER 15 MG PO TBCR: 15.0000 mg | EXTENDED_RELEASE_TABLET | Freq: Two times a day (BID) | ORAL | Status: DC | PRN

## 2012-10-30 MED ORDER — ZOLPIDEM TARTRATE 5 MG PO TABS
5.0000 mg | ORAL_TABLET | Freq: Every evening | ORAL | Status: DC | PRN
  Administered 2012-10-30 – 2012-10-31 (×2): 5 mg via ORAL
  Filled 2012-10-30 (×2): qty 1

## 2012-10-30 MED ORDER — LORAZEPAM 2 MG/ML PO CONC: 1.0000 mg | Freq: Every evening | ORAL | Status: DC | PRN

## 2012-10-30 NOTE — Progress Notes (Signed)
Pt reports 3 loose stools, and has chosen not to take reglan today because of this, no other problems.

## 2012-10-30 NOTE — Progress Notes (Signed)
CRITICAL VALUE ALERT  Critical value received:  WBC=1.0  Date of notification:  10/30/12  Time of notification:  06:20am  Critical value read back:yes  Nurse who received alert:  Tamala Bari  MD notified (1st page):  K.Schorr,NP w/[facility]  Time of first page:  06:33am  MD notified (2nd page):  Time of second page:  Responding MD:  Dr Denna Haggard  Time MD responded:  [phone] in progress notes

## 2012-10-30 NOTE — Progress Notes (Signed)
Patient ID: Joel Gibson, male   DOB: January 23, 1932, 79 y.o.   MRN: [id number] [facility] PROGRESS NOTE  Joel Gibson:[id number] DOB: 1932-02-11 DOA: 10/29/2012 PCP: Joel Sauer, MD  Brief narrative: 79 year old male with past medical history of non-small cell lung cancer on chemotherapy (under Joel Gibson care), brain and osseous metastasis status post radiation to right hip and sacrum and to multiple brain lesions (on steroids at home), hypertension, dyslipidemia who presented to [facility] ED with concern of ongoing sore throat and difficulty swallowing for past few days prior to this admission. Patient did not have associated difficulty breathing, shortness of breath or stridor. No fever or chills at home. No neck swelling. No nausa or vomiting. Pt reported rash on his face for about 1 day but no other area with rash and no itching or erythema.   In ED, vital signs were stable with BP 134/70, HR 83, T 97.8 F. CBC revealed neutropenia with WBC count of 2.6, hemoglobin of 12.2 and platelet count of 80. No BMP on file. Neck xray revealed mild soft tissue swelling involving the epiglottis and area around it. ENT was consulted by ED physician.   Assessment and Plan:  Principal Problem:  Oral thrush  - related to steroids and/or recent chemotherapy, pt reports feeling better this AM  - continue  fluconazole 200 mg IV daily day #2, nystatin suspension 4 x day and magic mouthwash with lidocaine  - appreciate ENT consult  Active Problems:  Acute pharyngitis  - will continue Unasyn started in ED day 2 - less oropharyngeal erythema on exam this AM Pancytopenia  - related to recent chemotherapy  - drop in blood counts overnight - pt remains afebrile, no signs of active bleed - monitor CBC closely, will see with oncologist in AM if Neupogen may be reasonable   - CDS for DVT prophylaxis, no need for blood product transfusion at this time  Hyperlipidemia  - continue statin therapy   Hypertension  - continue Norvasc 2.5 mg daily and avapro 300 mg daily  - reasonable inpatient control  Non small cell lung cancer with brain and osseous metastases  - on chemotherapy  - s/p palliative radiotherapy to right hip and sacrum  - s/p SRS to brain completed 10/21/2012  - will notify Joel Gibson in AM COPD (chronic obstructive pulmonary disease)  - stable  - continue spiriva  GERD (gastroesophageal reflux disease)  - protonix 40 mg daily   Consultants:  ENT  Procedures/Studies: Dg Neck Soft Tissue  10/29/2012   Mild soft tissue swelling involving the epiglottis, aryepiglottic folds and questionably the prevertebral soft tissues at the inferior cervical region.   Antibiotics:  Unasyn 8/23 -->  Fluconazole 8/23 -->   Code Status: Full Family Communication: Pt and wife at bedside Disposition Plan: Home when medically stable  HPI/Subjective: No events overnight.   Objective: Filed Vitals:   10/29/12 1032 10/29/12 1408 10/29/12 2200 10/30/12 0610  BP: 134/70 142/63 153/74 145/64  Pulse: 98 83 86 66  Temp: 97.8 F (36.6 C) 98.1 F (36.7 C) 98.6 F (37 C) 98.4 F (36.9 C)  TempSrc: Oral Oral Oral Oral  Resp: 17 18 20 20   Height:  5\' 7"  (1.702 m)    Weight:  65.7 kg (144 lb 13.5 oz)  66.5 kg (146 lb 9.7 oz)  SpO2: 100% 97% 98% 97%    Intake/Output Summary (Last 24 hours) at 10/30/12 0657 Last data filed at 10/30/12 0424  Gross per 24  hour  Intake 1631.25 ml  Output      1 ml  Net 1630.25 ml    Exam:   General:  Pt is alert, follows commands appropriately, not in acute distress, OP erythema noted but improving  Cardiovascular: Regular rate and rhythm, S1/S2, no murmurs, no rubs, no gallops  Respiratory: Clear to auscultation bilaterally, no wheezing, no crackles, no rhonchi  Abdomen: Soft, non tender, non distended, bowel sounds present, no guarding  Extremities: No edema, pulses DP and PT palpable bilaterally  Neuro: Grossly nonfocal  Data  Reviewed: Basic Metabolic Panel:  Recent Labs Lab 10/24/12 0801 10/29/12 1522 10/30/12 0522  NA 142 139 140  K 4.3 3.8 3.9  CL  --  106 108  CO2 24 27 26   GLUCOSE 103 96 127*  BUN 30.0* 34* 28*  CREATININE 0.9 1.01 0.91  CALCIUM 8.5 8.7 7.9*  MG  --  1.8  --   PHOS  --  3.0  --    Liver Function Tests:  Recent Labs Lab 10/24/12 0801 10/29/12 1522 10/30/12 0522  AST 21 22 17   ALT 27 24 21   ALKPHOS 52 70 68  BILITOT 0.48 0.6 0.3  PROT 5.8* 5.1* 4.8*  ALBUMIN 2.9* 2.6* 2.4*   CBC:  Recent Labs Lab 10/24/12 0801 10/29/12 1145 10/30/12 0522  WBC 15.6* 2.6* 1.0*  NEUTROABS 14.3* 2.0  --   HGB 12.5* 12.2* 10.6*  HCT 37.8* 37.3* 32.5*  MCV 101.3* 101.4* 101.2*  PLT 159 80* 59*   CBG:  Recent Labs Lab 10/29/12 1630  GLUCAP 149*   Scheduled Meds: . amLODipine  2.5 mg Oral Daily  . UNASYN IV  3 g Intravenous Q6H  . aspirin EC  81 mg Oral QODAY  . atorvastatin  40 mg Oral q1800  . betaxolol  1 drop Both Eyes BID  . dexamethasone  4 mg Oral Daily  . dipyridamole-aspirin  1 capsule Oral QODAY  . dipyridamole-aspirin  1 capsule Oral QHS  . fluconazole  IV  200 mg Intravenous Q24H  . irbesartan  300 mg Oral Daily  . loratadine  10 mg Oral Daily  . metoCLOPramide  10 mg Oral TID AC & HS  . nystatin  5 mL Oral QID  . pantoprazole  40 mg Oral Daily  . tiotropium  18 mcg Inhalation Daily   Continuous Infusions: . sodium chloride 75 mL/hr at 10/29/12 1435   Joel Presto, MD  [facility] Pager [phone]  If 7PM-7AM, please contact night-coverage [email or web address] Password [facility] 10/30/2012, 6:57 AM   LOS: 1 day

## 2012-10-30 NOTE — Evaluation (Signed)
Physical Therapy Evaluation-1x eval Patient Details Name: ELDWIN Gibson MRN: [id number] DOB: 18-Dec-1931 Today's Date: 10/30/2012 Time: 6213-0865 PT Time Calculation (min): 11 min  PT Assessment / Plan / Recommendation History of Present Illness  79 year old male with past medical history of non-small cell lung cancer on chemotherapy (under Dr. Asa Lente care), brain and osseous metastasis status post radiation to right hip and sacrum and SRS to multiple brain lesions (on steroids at home), hypertension, dyslipidemia who presented to [facility] ED with complaints of ongoing sore throat and difficulty swallowing for past few days prior to this admission.   Clinical Impression  On eval, pt required supervision level assist for mobility-able to ambulate ~225 feet. No follow up PT needs at this time. 1x eval.    PT Assessment  Patent does not need any further PT services    Follow Up Recommendations  No PT follow up    Does the patient have the potential to tolerate intense rehabilitation      Barriers to Discharge        Equipment Recommendations  None recommended by PT    Recommendations for Other Services     Frequency      Precautions / Restrictions Precautions Precautions: Fall Restrictions Weight Bearing Restrictions: No   Pertinent Vitals/Pain No c/o pain during session      Mobility  Bed Mobility Bed Mobility: Not assessed Details for Bed Mobility Assistance: pt sitting in recliner Transfers Transfers: Sit to Stand;Stand to Sit Sit to Stand: 5: Supervision;From chair/3-in-1 Stand to Sit: 5: Supervision;To chair/3-in-1 Details for Transfer Assistance: slightly unsteady.  Ambulation/Gait Ambulation/Gait Assistance: 5: Supervision Ambulation Distance (Feet): 225 Feet Assistive device: None;1 person hand held assist Ambulation/Gait Assistance Details: Pt pushed IV pole for first half of walk, then ambulated without support remainder of distance. No LOB. Occasional stumble  but no LOB Gait Pattern: Step-through pattern    Exercises     PT Diagnosis:    PT Problem List:   PT Treatment Interventions:       PT Goals(Current goals can be found in the care plan section) Acute Rehab PT Goals PT Goal Formulation: No goals set, d/c therapy  Visit Information  Last PT Received On: 10/30/12 Assistance Needed: +1 History of Present Illness: 78 year old male with past medical history of non-small cell lung cancer on chemotherapy (under Dr. Asa Lente care), brain and osseous metastasis status post radiation to right hip and sacrum and SRS to multiple brain lesions (on steroids at home), hypertension, dyslipidemia who presented to [facility] ED with complaints of ongoing sore throat and difficulty swallowing for past few days prior to this admission.        Prior Functioning  Home Living Family/patient expects to be discharged to:: Private residence Living Arrangements: Spouse/significant other Available Help at Discharge: Family Home Equipment: None Prior Function Level of Independence: Independent Communication Communication: No difficulties    Cognition  Cognition Arousal/Alertness: Awake/alert Behavior During Therapy: WFL for tasks assessed/performed Overall Cognitive Status: Within Functional Limits for tasks assessed    Extremity/Trunk Assessment Upper Extremity Assessment Upper Extremity Assessment: Overall WFL for tasks assessed Lower Extremity Assessment Lower Extremity Assessment: RLE deficits/detail RLE Deficits / Details: some weakness noted, but strength at least 4/5 throughout.  Cervical / Trunk Assessment Cervical / Trunk Assessment: Normal   Balance Balance Balance Assessed: Yes Static Standing Balance Static Standing - Balance Support: No upper extremity supported Static Standing - Level of Assistance: 6: Modified independent (Device/Increase time) Static Standing -  Comment/# of Minutes: EO/EC; external perturbations; narrow BOS-no LOB but  pt did have to use 1 hand support to achieve narrow BOS position.  Dynamic Standing Balance Dynamic Standing - Balance Support: No upper extremity supported Dynamic Standing - Comments: 360 degree turn-to R and L; pickup object-no LOB.  High Level Balance High Level Balance Activites: Turns;Direction changes;Backward walking High Level Balance Comments: close guarding with backwards walking.   End of Session PT - End of Session Equipment Utilized During Treatment: Gait belt Activity Tolerance: Patient tolerated treatment well Patient left: in chair;with call bell/phone within reach;with family/visitor present  GP     Rebeca Alert, MPT Pager: [phone]

## 2012-10-30 NOTE — Progress Notes (Signed)
Attempted to meet with Pt for COPD Gold assessment.  MD with Pt.  Weekday CSW to follow.  Providence Crosby, LCSWA Clinical Social Work [phone]

## 2012-10-30 NOTE — Progress Notes (Signed)
INITIAL NUTRITION ASSESSMENT  DOCUMENTATION CODES Per approved criteria  -Not Applicable   INTERVENTION: 1. Patient encouraged to continue to have adequate intake and request snacks as desired.  2. RD to follow per nutrition plan of care.   NUTRITION DIAGNOSIS: No nutrition diagnosis at this time.   Goal: Patient will meet >/=90% of estimated nutrition needs  Monitor:  PO intake, weight, labs  Reason for Assessment: Consult, assessment of nutrition status and requirements  79 y.o. male  Admitting Dx: Oral thrush  ASSESSMENT: Patient with history of non-small cell lung cancer with brain and bone metastases, on chemotherapy, s/p radiation. He was admitted with oral thrush. He reports that his intake is good, with 100% meal completion. He denies change in appetite or weight prior to admission. He declines snacks/supplements at this time.   Height: Ht Readings from Last 1 Encounters:  10/29/12 5\' 7"  (1.702 m)    Weight: Wt Readings from Last 1 Encounters:  10/30/12 146 lb 9.7 oz (66.5 kg)    Ideal Body Weight: 148 pounds  % Ideal Body Weight: 99%  Wt Readings from Last 10 Encounters:  10/30/12 146 lb 9.7 oz (66.5 kg)  10/24/12 148 lb 6.4 oz (67.314 kg)  10/17/12 150 lb 12.8 oz (68.402 kg)  10/13/12 148 lb 12.8 oz (67.495 kg)  10/12/12 149 lb 6.4 oz (67.767 kg)  10/07/12 152 lb 8 oz (69.174 kg)  10/03/12 151 lb 9.6 oz (68.765 kg)  09/19/12 151 lb 12.8 oz (68.856 kg)  09/05/12 150 lb 12.8 oz (68.402 kg)  09/01/12 151 lb 4.8 oz (68.629 kg)     Usual Body Weight: 150 pounds  % Usual Body Weight: 97%  BMI:  Body mass index is 22.96 kg/(m^2). Patient is normal weight.   Estimated Nutritional Needs: Kcal: 1750-1900 kcal Protein: 80-90 g Fluid: >1.9 L/day  Skin: Intact  Diet Order: General  EDUCATION NEEDS: -No education needs identified at this time   Intake/Output Summary (Last 24 hours) at 10/30/12 1213 Last data filed at 10/30/12 0700  Gross per 24  hour  Intake 2351.25 ml  Output      1 ml  Net 2350.25 ml    Last BM: 8/23   Labs:   Recent Labs Lab 10/24/12 0801 10/29/12 1522 10/30/12 0522  NA 142 139 140  K 4.3 3.8 3.9  CL  --  106 108  CO2 24 27 26   BUN 30.0* 34* 28*  CREATININE 0.9 1.01 0.91  CALCIUM 8.5 8.7 7.9*  MG  --  1.8  --   PHOS  --  3.0  --   GLUCOSE 103 96 127*    CBG (last 3)   Recent Labs  10/29/12 1630  GLUCAP 149*    Scheduled Meds: . amLODipine  2.5 mg Oral Daily  . ampicillin-sulbactam (UNASYN) IV  3 g Intravenous Q6H  . aspirin EC  81 mg Oral QODAY  . atorvastatin  40 mg Oral q1800  . betaxolol  1 drop Both Eyes BID  . dexamethasone  4 mg Oral BID WC  . dipyridamole-aspirin  1 capsule Oral QODAY  . dipyridamole-aspirin  1 capsule Oral QHS  . fluconazole (DIFLUCAN) IV  200 mg Intravenous Q24H  . irbesartan  300 mg Oral Daily  . loratadine  10 mg Oral Daily  . magic mouthwash w/lidocaine  5 mL Oral QID  . metoCLOPramide  10 mg Oral TID AC & HS  . multivitamin with minerals  1 tablet Oral Daily  . nystatin  5 mL Oral QID  . pantoprazole  40 mg Oral Daily  . sodium chloride  3 mL Intravenous Q12H  . tiotropium  18 mcg Inhalation Daily    Continuous Infusions: . sodium chloride 75 mL/hr at 10/29/12 1435    Past Medical History  Diagnosis Date  . Hyperlipidemia   . Hypertension   . Aortic stenosis     mild to moderate  . Gastritis   . COPD (chronic obstructive pulmonary disease)   . Bronchitis   . Hepatitis     possible  . Hx of cataract surgery     lt eye  . Tobacco abuse   . Shortness of breath     with activity  . Retinal vein occlusion 1990  . GERD (gastroesophageal reflux disease)   . Hemorrhoid   . Aortic stenosis   . Hypertension   . Dyslipidemia   . Glaucoma   . Right rib fracture      Pathological Fracture / 4 weeks ago  . Lung mass     Right Upper Lobe - Lobular Mass  . SOB (shortness of breath)     Mild  . Cough   . Stroke 2002    Right slight  difference in vision  . S/P radiation therapy 02/22/12 - 03/07/12    Pelvis/Sacrum/30 Gray/10 Fractions and Left Scapula/Rib/ 8 Gray/1 Fraction   . Status post chemotherapy      Carboplatin/ Abraxane  . On antineoplastic chemotherapy     Gemcitabine and Xgeva  . Lung cancer 02/02/2012  . Bone metastases     Widespread/ Lytic Lesion Left 7t Rib, Left Iliac Pelvis, ribs and scapula    Past Surgical History  Procedure Laterality Date  . Vasectomy    . Tonsillectomy    . Leg surgery      rt for nerve impingement  . Collarbone fracture      1977  . Tonsillectomy    . Eye surgery      catarct bil  . Portacath placement  02/08/2012    Procedure: INSERTION PORT-A-CATH;  Surgeon: [name], MD;  Location: [facility] OR;  Service: Thoracic;  Laterality: Right;  . Ct guided core biopsy  01/26/12    Left Lytic Lesion - Metastatic Squamous Cell Carcinoma  . Skin biopsy  02/09/12    Right Upper Back, shave : Melanoma In Situ Arising  From a Dysplastic Nevus    Linnell Fulling, RD, LDN Pager #: [phone] After-Hours Pager #: [phone]

## 2012-10-31 ENCOUNTER — Ambulatory Visit: Payer: Medicare Other

## 2012-10-31 ENCOUNTER — Other Ambulatory Visit: Payer: Medicare Other | Admitting: Lab

## 2012-10-31 ENCOUNTER — Other Ambulatory Visit: Payer: Medicare Other

## 2012-10-31 LAB — GLUCOSE, CAPILLARY: Glucose-Capillary: 113 mg/dL — ABNORMAL HIGH (ref 70–99)

## 2012-10-31 LAB — CBC
HCT: 31.4 % — ABNORMAL LOW (ref 39.0–52.0)
Hemoglobin: 10.3 g/dL — ABNORMAL LOW (ref 13.0–17.0)
MCH: 33 pg (ref 26.0–34.0)
MCHC: 32.8 g/dL (ref 30.0–36.0)
MCV: 100.6 fL — ABNORMAL HIGH (ref 78.0–100.0)

## 2012-10-31 LAB — BASIC METABOLIC PANEL
BUN: 21 mg/dL (ref 6–23)
Calcium: 7.3 mg/dL — ABNORMAL LOW (ref 8.4–10.5)
GFR calc non Af Amer: 80 mL/min — ABNORMAL LOW (ref >90)
Glucose, Bld: 134 mg/dL — ABNORMAL HIGH (ref 70–99)

## 2012-10-31 NOTE — Evaluation (Signed)
Occupational Therapy Evaluation Patient Details Name: Joel Gibson MRN: [id number] DOB: 05/30/1931 Today's Date: 10/31/2012 Time: 1206-1220 OT Time Calculation (min): 14 min  OT Assessment / Plan / Recommendation History of present illness 79 year old male with past medical history of non-small cell lung cancer on chemotherapy (under Dr. Asa Lente care), brain and osseous metastasis status post radiation to right hip and sacrum and SRS to multiple brain lesions (on steroids at home), hypertension, dyslipidemia who presented to [facility] ED with complaints of ongoing sore throat and difficulty swallowing for past few days prior to this admission.    Clinical Impression   Pt overall S level with ADL activity in which wife able to provide    OT Assessment  Patient does not need any further OT services    Follow Up Recommendations  No OT follow up                Precautions / Restrictions Precautions Precautions: None       ADL  Toilet Transfer: Performed;Supervision/safety Toilet Transfer Method: Sit to stand Toilet Transfer Equipment: Regular height toilet Toileting - Clothing Manipulation and Hygiene: Performed;Supervision/safety Where Assessed - Engineer, mining and Hygiene: Standing Tub/Shower Transfer: Landscape architect Method: Ambulating Transfers/Ambulation Related to ADLs: Pt overall S level with all ADL activity. Wife does report putting on pts socks for some time now.   ADL Comments: Reccomend grab bar and non skid mat for pts bathtub         Visit Information  Last OT Received On: 10/31/12 Assistance Needed: +1 History of Present Illness: 79 year old male with past medical history of non-small cell lung cancer on chemotherapy (under Dr. Asa Lente care), brain and osseous metastasis status post radiation to right hip and sacrum and SRS to multiple brain lesions (on steroids at home), hypertension, dyslipidemia who presented  to [facility] ED with complaints of ongoing sore throat and difficulty swallowing for past few days prior to this admission.        Prior Functioning     Home Living Family/patient expects to be discharged to:: Private residence Living Arrangements: Spouse/significant other Available Help at Discharge: Family Type of Home: House Home Layout: One level Home Equipment: None Prior Function Level of Independence: Independent Communication Communication: No difficulties         Vision/Perception Vision - History Patient Visual Report: No change from baseline   Cognition  Cognition Arousal/Alertness: Awake/alert Behavior During Therapy: WFL for tasks assessed/performed Overall Cognitive Status: Within Functional Limits for tasks assessed    Extremity/Trunk Assessment Upper Extremity Assessment Upper Extremity Assessment: Overall WFL for tasks assessed     Mobility Transfers Transfers: Sit to Stand;Stand to Sit Sit to Stand: 5: Supervision;From chair/3-in-1;From toilet;From bed Stand to Sit: 5: Supervision;With upper extremity assist;To toilet;To chair/3-in-1           End of Session OT - End of Session Activity Tolerance: Patient tolerated treatment well Patient left: in chair;with call bell/phone within reach;with family/visitor present  GO     [name] 10/31/2012, 12:26 PM

## 2012-10-31 NOTE — Progress Notes (Signed)
Patient ID: Joel Gibson, male   DOB: [birth year], 79 y.o.   MRN: [id number] [facility] PROGRESS NOTE  Joel Gibson YQM:[id number] DOB: 1931/10/12 DOA: 10/29/2012 PCP: [name], MD  Brief narrative:  79 year old male with past medical history of non-small cell lung cancer on chemotherapy (under Dr. Asa Lente care), brain and osseous metastasis status post radiation to right hip and sacrum and to multiple brain lesions (on steroids at home), hypertension, dyslipidemia who presented to [facility] ED with concern of ongoing sore throat and difficulty swallowing for past few days prior to this admission. Patient did not have associated difficulty breathing, shortness of breath or stridor. No fever or chills at home. No neck swelling. No nausa or vomiting. Pt reported rash on his face for about 1 day but no other area with rash and no itching or erythema.   In ED, vital signs were stable with BP 134/70, HR 83, T 97.8 F. CBC revealed neutropenia with WBC count of 2.6, hemoglobin of 12.2 and platelet count of 80. No BMP on file. Neck xray revealed mild soft tissue swelling involving the epiglottis and area around it. ENT was consulted by ED physician.   Assessment and Plan:  Principal Problem:  Oral thrush  - related to steroids and/or recent chemotherapy, pt reports feeling better this AM  - continue fluconazole 200 mg IV daily day #3, nystatin suspension 4 x day and magic mouthwash with lidocaine  - appreciate ENT consult  Active Problems:  Acute pharyngitis  - will continue Unasyn started in ED day 3 - less oropharyngeal erythema on exam this AM  Pancytopenia  - related to recent chemotherapy  - blood counts overall stable - pt remains afebrile, no signs of active bleed  - monitor CBC closely, please note that pt has received dose of Neupogen day after his last chemotherapy  - SCD's for DVT prophylaxis, no need for blood product transfusion at this time  Hyperlipidemia  - continue  statin therapy  Hypertension  - continue Norvasc 2.5 mg daily and avapro 300 mg daily  - reasonable inpatient control  Non small cell lung cancer with brain and osseous metastases  - on chemotherapy  - s/p palliative radiotherapy to right hip and sacrum  - s/p SRS to brain completed 10/21/2012  - i have notified oncologist Dr. Arbutus Ped and radiation oncologist Dr. Basilio Cairo of pt's admission COPD (chronic obstructive pulmonary disease)  - stable  - continue spiriva  GERD (gastroesophageal reflux disease)  - protonix 40 mg daily   Consultants:  ENT Procedures/Studies:  Dg Neck Soft Tissue 10/29/2012 Mild soft tissue swelling involving the epiglottis, aryepiglottic folds and questionably the prevertebral soft tissues at the inferior cervical region.  Antibiotics:  Unasyn 8/23 -->  Fluconazole 8/23 -->  Code Status: Full  Family Communication: Pt and wife at bedside  Disposition Plan: Home when medically stable   HPI/Subjective: No events overnight.   Objective: Filed Vitals:   10/30/12 1325 10/30/12 2112 10/31/12 0524 10/31/12 0909  BP: 129/88 156/70 131/60   Pulse: 98 77 85   Temp: 98.1 F (36.7 C) 98.2 F (36.8 C) 98.1 F (36.7 C)   TempSrc: Oral Oral Oral   Resp: 18 18 18    Height:      Weight:   66.4 kg (146 lb 6.2 oz)   SpO2: 98% 98% 96% 95%    Intake/Output Summary (Last 24 hours) at 10/31/12 1037 Last data filed at 10/31/12 0810  Gross per 24 hour  Intake 3378.75 ml  Output      3 ml  Net 3375.75 ml    Exam:   General:  Pt is alert, follows commands appropriately, not in acute distress, oral thrush noted with less oropharyngeal erythema   Cardiovascular: Regular rate and rhythm, S1/S2, no murmurs, no rubs, no gallops  Respiratory: Clear to auscultation bilaterally, no wheezing, no crackles, no rhonchi  Abdomen: Soft, non tender, non distended, bowel sounds present, no guarding  Extremities: No edema, pulses DP and PT palpable bilaterally  Neuro:  Grossly nonfocal  Data Reviewed: Basic Metabolic Panel:  Recent Labs Lab 10/29/12 1522 10/30/12 0522 10/31/12 0435  NA 139 140 140  K 3.8 3.9 3.7  CL 106 108 108  CO2 27 26 25   GLUCOSE 96 127* 134*  BUN 34* 28* 21  CREATININE 1.01 0.91 0.86  CALCIUM 8.7 7.9* 7.3*  MG 1.8  --   --   PHOS 3.0  --   --    Liver Function Tests:  Recent Labs Lab 10/29/12 1522 10/30/12 0522  AST 22 17  ALT 24 21  ALKPHOS 70 68  BILITOT 0.6 0.3  PROT 5.1* 4.8*  ALBUMIN 2.6* 2.4*   CBC:  Recent Labs Lab 10/29/12 1145 10/30/12 0522 10/31/12 0435  WBC 2.6* 1.0* 1.8*  NEUTROABS 2.0  --   --   HGB 12.2* 10.6* 10.3*  HCT 37.3* 32.5* 31.4*  MCV 101.4* 101.2* 100.6*  PLT 80* 59* 54*   CBG:  Recent Labs Lab 10/29/12 1630  GLUCAP 149*   Scheduled Meds: . amLODipine  2.5 mg Oral Daily  . UNASYN IV  3 g Intravenous Q6H  . aspirin EC  81 mg Oral QODAY  . atorvastatin  40 mg Oral q1800  . betaxolol  1 drop Both Eyes BID  . dexamethasone  4 mg Oral BID WC  . dipyridamole-aspirin  1 capsule Oral QODAY  . dipyridamole-aspirin  1 capsule Oral QHS  . fluconazole IV  200 mg Intravenous Q24H  . irbesartan  300 mg Oral Daily  . loratadine  10 mg Oral Daily  . magic mouthwash   5 mL Oral QID  . metoCLOPramide  10 mg Oral TID AC & HS  . multivitamin  1 tablet Oral Daily  . nystatin  5 mL Oral QID  . pantoprazole  40 mg Oral Daily  . tiotropium  18 mcg Inhalation Daily   Continuous Infusions: . sodium chloride 75 mL/hr at 10/31/12 0232   Debbora Presto, MD  [facility] Pager [phone]  If 7PM-7AM, please contact night-coverage [email or web address] Password [email or web address] 10/31/2012, 10:37 AM   LOS: 2 days

## 2012-10-31 NOTE — Care Management Note (Addendum)
    Page 1 of 1   11/01/2012     12:12:21 PM   CARE MANAGEMENT NOTE 11/01/2012  Patient:  Joel Gibson, Joel Gibson   Account Number:  [id number]  Date Initiated:  10/30/2012  Documentation initiated by:  Lanier Clam  Subjective/Objective Assessment:   79 y/o m admitted w/oral thrush.ZO:XWRU ca.     Action/Plan:   From home.   Anticipated DC Date:  11/01/2012   Anticipated DC Plan:  HOME/SELF CARE      DC Planning Services  CM consult      Choice offered to / List presented to:             Status of service:  Completed, signed off Medicare Important Message given?   (If response is "NO", the following Medicare IM given date fields will be blank) Date Medicare IM given:   Date Additional Medicare IM given:    Discharge Disposition:  HOME/SELF CARE  Per UR Regulation:  Reviewed for med. necessity/level of care/duration of stay  If discussed at Long Length of Stay Meetings, dates discussed:    Comments:  11/01/12 Markevius Trombetta RN,BSN NCM [phone] D/C HOME NO NEEDS OR ORDERS.  10/31/12 Macon Sandiford RN,BSN NCM [phone] PATIENT HAS SPOUSE SUPPORT,& HHA SERVICES THROUGH INSURANCE.MANAGES WELL W/CURRENT SUPPORT SYSTEM THAT COPD GOLD HH SERVICES NOT NECESSARY.D/C PLAN HOME, NO FURTHER D/C NEEDS.

## 2012-11-01 LAB — BASIC METABOLIC PANEL
Calcium: 7.4 mg/dL — ABNORMAL LOW (ref 8.4–10.5)
Creatinine, Ser: 0.89 mg/dL (ref 0.50–1.35)
GFR calc non Af Amer: 79 mL/min — ABNORMAL LOW (ref >90)
Glucose, Bld: 117 mg/dL — ABNORMAL HIGH (ref 70–99)
Sodium: 140 mEq/L (ref 135–145)

## 2012-11-01 LAB — CBC
MCH: 32.6 pg (ref 26.0–34.0)
MCHC: 32.5 g/dL (ref 30.0–36.0)
Platelets: 61 10*3/uL — ABNORMAL LOW (ref 150–400)

## 2012-11-01 MED ORDER — FLUCONAZOLE 100 MG PO TABS: 100.0000 mg | ORAL_TABLET | Freq: Every day | ORAL | Status: DC

## 2012-11-01 MED ORDER — MAGIC MOUTHWASH
5.0000 mL | Freq: Three times a day (TID) | ORAL | Status: DC | PRN
  Filled 2012-11-01: qty 5

## 2012-11-01 MED ORDER — AMOXICILLIN-POT CLAVULANATE 500-125 MG PO TABS: 1.0000 | ORAL_TABLET | Freq: Three times a day (TID) | ORAL | Status: DC

## 2012-11-01 MED ORDER — LORAZEPAM 1 MG PO TABS
1.0000 mg | ORAL_TABLET | Freq: Every evening | ORAL | Status: AC | PRN
Start: 2012-11-01 — End: ?

## 2012-11-01 MED ORDER — MAGIC MOUTHWASH W/LIDOCAINE: 5.0000 mL | Freq: Four times a day (QID) | ORAL | Status: DC | PRN

## 2012-11-01 NOTE — Progress Notes (Signed)
Clinical Social Work Department BRIEF PSYCHOSOCIAL ASSESSMENT 11/01/2012  Patient:  Joel Gibson, Joel Gibson     Account Number:  [id number]     Admit date:  10/29/2012  Clinical Social Worker:  Hattie Perch  Date/Time:  11/01/2012 12:00 M  Referred by:  Physician  Date Referred:  11/01/2012 Referred for  Other - See comment   Other Referral:   COPD Gold Protocol   Interview type:  Patient Other interview type:   spouse at bedside    PSYCHOSOCIAL DATA Living Status:  FAMILY Admitted from facility:   Level of care:   Primary support name:  Joel Gibson Primary support relationship to patient:  SPOUSE Degree of support available:   good    CURRENT CONCERNS Current Concerns  Other - See comment   Other Concerns:   COPD GOLD    SOCIAL WORK ASSESSMENT / PLAN CSW met with patient and completed anxiety and depressions screenings. they were placed on chart. patient is alert and oriented X3 and in excellent spirits. patient appears to be upbeat and energetic despite a stage IV lung cancer diagnosis. He states that he thinks he has it in him to beat this thing. Patient had very low scores on screenings and does not appear to suffer from any anxiety or depression.   Assessment/plan status:   Other assessment/ plan:   Information/referral to community resources:    PATIENT'S/FAMILY'S RESPONSE TO PLAN OF CARE: patient is optomistic about his treatment and is really hoping to overcome his lung cancer. he denies any anxiety or depression surrounding his COPD or any other illness.

## 2012-11-01 NOTE — Discharge Summary (Signed)
Physician Discharge Summary  RISHITH SIDDOWAY ZOX:[id number] DOB: 1932-03-03 DOA: 10/29/2012  PCP: [name], MD  Admit date: 10/29/2012 Discharge date: 11/01/2012  Recommendations for Outpatient Follow-up:  1. Pt will need to follow up with PCP in 2-3 weeks post discharge 2. Please obtain BMP to evaluate electrolytes and kidney function 3. Please also check CBC to evaluate Hg and Hct levels, white blood cells and platelet count 4. Please note the patient was discharged on Augmentin to complete therapy for pharyngitis for 7 more days post discharge and 5. Please also note that patient was discharged on for, so 100 mg tablet by mouth daily to complete therapy for 10 days post discharge for oral thrush 6. Patient will continue dexamethasone at taper as prescribed by Dr. Basilio Cairo 7. Patient has followup appointment schedule with his oncologist  Discharge Diagnoses:  Principal Problem:   Oral thrush Active Problems:   Hyperlipidemia   Hypertension   Lung cancer   COPD (chronic obstructive pulmonary disease)   GERD (gastroesophageal reflux disease)   Bone metastases   Brain metastases   Acute pharyngitis   Antineoplastic chemotherapy induced pancytopenia   Anemia of chronic disease  Discharge Condition: Stable  Diet recommendation: Heart healthy diet discussed in details   Brief narrative:  79 year old male with past medical history of non-small cell lung cancer on chemotherapy (under Dr. Asa Lente care), brain and osseous metastasis status post radiation to right hip and sacrum and to multiple brain lesions (on steroids at home), hypertension, dyslipidemia who presented to [facility] ED with concern of ongoing sore throat and difficulty swallowing for past few days prior to this admission. Patient did not have associated difficulty breathing, shortness of breath or stridor. No fever or chills at home. No neck swelling. No nausa or vomiting. Pt reported rash on his face for about 1 day but  no other area with rash and no itching or erythema.   In ED, vital signs were stable with BP 134/70, HR 83, T 97.8 F. CBC revealed neutropenia with WBC count of 2.6, hemoglobin of 12.2 and platelet count of 80. No BMP on file. Neck xray revealed mild soft tissue swelling involving the epiglottis and area around it. ENT was consulted by ED physician.   Assessment and Plan:  Principal Problem:  Oral thrush  - related to steroids and/or recent chemotherapy, pt reports feeling better this AM  - continued fluconazole 200 mg IV daily day #4, nystatin suspension 4 x day and magic mouthwash with lidocaine and patient reports significant improvement - appreciate ENT consult  - Patient will be transitioned to oral fluconazole 100 mg tablet daily to complete therapy for 10 more days post discharge which will complete a total of 14 days therapy given inpatient treatment Active Problems:  Acute pharyngitis  - continued Unasyn started in ED day 4 and will plan on transitioning to oral Augmentin to complete therapy for 7 more days post discharge - less oropharyngeal erythema on exam this AM  Pancytopenia  - related to recent chemotherapy  - blood counts overall stable and improving over 24 hours - pt remains afebrile, no signs of active bleed  - monitor CBC closely, please note that pt has received dose of Neupogen day after his last chemotherapy  - SCD's for DVT prophylaxis provided inpatient, no need for blood product transfusion while inpatient Hyperlipidemia  - continue statin therapy  Hypertension  - continue Norvasc 2.5 mg daily and avapro 300 mg daily  - reasonable inpatient control  Non small cell lung cancer with brain and osseous metastases  - on chemotherapy  - s/p palliative radiotherapy to right hip and sacrum  - s/p SRS to brain completed 10/21/2012  - i have notified oncologist Dr. Arbutus Ped and radiation oncologist Dr. Basilio Cairo of pt's admission  COPD (chronic obstructive pulmonary  disease)  - stable  - continue spiriva  GERD (gastroesophageal reflux disease)  - protonix 40 mg daily   Consultants:  ENT Procedures/Studies:  Dg Neck Soft Tissue 10/29/2012 Mild soft tissue swelling involving the epiglottis, aryepiglottic folds and questionably the prevertebral soft tissues at the inferior cervical region.  Antibiotics:  Unasyn 8/23 --> 7 more days post discharge Fluconazole 8/23 --> 10 more days post discharge  Code Status: Full  Family Communication: Pt and wife at bedside    Discharge Exam: Filed Vitals:   11/01/12 0629  BP: 157/80  Pulse: 74  Temp: 97.6 F (36.4 C)  Resp: 20   Filed Vitals:   10/31/12 1400 10/31/12 2139 11/01/12 0629 11/01/12 0754  BP: 165/80 164/84 157/80   Pulse: 81 74 74   Temp: 97.7 F (36.5 C) 98.1 F (36.7 C) 97.6 F (36.4 C)   TempSrc: Oral Oral Oral   Resp: 16 20 20    Height:      Weight:   66.2 kg (145 lb 15.1 oz)   SpO2: 98% 95% 98% 97%    General: Pt is alert, follows commands appropriately, not in acute distress, improving oral thrush, less oropharyngeal erythema noted, normal uvula Cardiovascular: Regular rate and rhythm, S1/S2 +, no murmurs, no rubs, no gallops Respiratory: Clear to auscultation bilaterally, no wheezing, no crackles, no rhonchi Abdominal: Soft, non tender, non distended, bowel sounds +, no guarding Extremities: no edema, no cyanosis, pulses palpable bilaterally DP and PT Neuro: Grossly nonfocal  Discharge Instructions  Discharge Orders   Future Appointments Provider Department Dept Phone   11/14/2012 8:45 AM Krista Blue [facility] [place] [facility] [phone]   11/14/2012 9:15 AM Marlana Salvage [place] [facility] [phone]   11/14/2012 10:15 AM [facility] [place] [facility] [phone]   11/15/2012 8:30 AM [facility] Inj Nurse [place] [facility] [phone]   11/21/2012 8:45 AM [facility] [place] [facility] [phone]   11/22/2012 3:40 PM Lonie Peak, MD [place] [facility] RAD[facility]COL[facility] [phone]   11/28/2012 8:30 AM [facility] Only [place] [facility] [phone]   12/05/2012 8:45 AM Dava Najjar [facility] [phone]   12/05/2012 9:15 AM [facility] [place] [facility] [phone]   12/06/2012 8:45 AM [facility] Inj Nurse [place] [facility] [phone]   Future Orders Complete By Expires   Diet - low sodium heart healthy  As directed    Increase activity slowly  As directed        Medication List         AGGRENOX PO  Take 25-200 mg by mouth 2 (two) times daily. Takes 2 tabs one day alt. with 1 tab next day,etc.     albuterol 108 (90 BASE) MCG/ACT inhaler  Commonly known as:  PROVENTIL HFA;VENTOLIN HFA  Inhale 2 puffs into the lungs every 4 (four) hours as needed.     amLODipine 2.5 MG tablet  Commonly known as:  NORVASC  Take 1 tablet (2.5 mg total) by mouth daily.  amoxicillin-clavulanate 500-125 MG per tablet  Commonly known as:  AUGMENTIN  Take 1 tablet (500 mg total) by mouth 3 (three) times daily.     aspirin EC 81 MG tablet  Take 81 mg by mouth every other day. Takes along with Aggrenox     betaxolol 0.25 % ophthalmic suspension  Commonly known as:  BETOPTIC-S  Place 1 drop into both eyes daily. One drop in the left eye in the am....one drop in the right eye in the am and in the pm.     CALCIUM 600 PO  Take 600 mg by mouth 2 (two) times daily.     dexamethasone 2 MG tablet  Commonly known as:  DECADRON  take 2 tablets by mouth 3 times a day with food and taper as directed     dexamethasone 4 MG tablet  Commonly known as:  DECADRON  Take 1 tablet (4 mg total) by mouth as directed. 2 tablets BID the day before, day of, and day after chemotherapy     EQUATE PO  Take by mouth. Daily      fluconazole 100 MG tablet  Commonly known as:  DIFLUCAN  Take 1 tablet (100 mg total) by mouth daily.     HYDROcodone-homatropine 5-1.5 MG/5ML syrup  Commonly known as:  HYCODAN  Take 5 mLs by mouth every 6 (six) hours as needed for cough.     irbesartan 300 MG tablet  Commonly known as:  AVAPRO  Take 300 mg by mouth daily.     lidocaine-prilocaine cream  Commonly known as:  EMLA  Apply 1 application topically as needed.     loratadine 10 MG tablet  Commonly known as:  CLARITIN  Take 10 mg by mouth daily.     LORazepam 1 MG tablet  Commonly known as:  ATIVAN  Take 1 tablet (1 mg total) by mouth at bedtime as needed for anxiety.     magic mouthwash w/lidocaine Soln  Take 5 mLs by mouth 4 (four) times daily as needed.     metoCLOPramide 10 MG tablet  Commonly known as:  REGLAN  Take 1 tablet (10 mg total) by mouth 4 (four) times daily.     morphine 15 MG 12 hr tablet  Commonly known as:  MS CONTIN  Take 1 tablet (15 mg total) by mouth 2 (two) times daily.     MULTIVITAMIN PO  Take 1 tablet by mouth at bedtime.     omeprazole 20 MG capsule  Commonly known as:  PRILOSEC  Take 20 mg by mouth daily. Per Dr. Virgel Manifold     oxyCODONE 5 MG immediate release tablet  Commonly known as:  Oxy IR/ROXICODONE  Take 1 tablet (5 mg total) by mouth every 4 (four) hours as needed for pain.     prochlorperazine 10 MG tablet  Commonly known as:  COMPAZINE  Take 10 mg by mouth every 6 (six) hours as needed. For nausea     rosuvastatin 40 MG tablet  Commonly known as:  CRESTOR  Take 20 mg by mouth at bedtime.     temazepam 15 MG capsule  Commonly known as:  RESTORIL  Take 1 capsule (15 mg total) by mouth at bedtime as needed for sleep.     tiotropium 18 MCG inhalation capsule  Commonly known as:  SPIRIVA  Place 18 mcg into inhaler and inhale daily.           Follow-up Information   Follow up with [name], MD  In 2 weeks.   Specialty:  Internal Medicine   Contact  information:   [address] [facility] [place] [address] [phone]       Follow up with Debbora Presto, MD. (call my cell with any questions [phone])    Specialty:  Internal Medicine   Contact information:   [address] [place] [address] [phone]        The results of significant diagnostics from this hospitalization (including imaging, microbiology, ancillary and laboratory) are listed below for reference.     Microbiology: No results found for this or any previous visit (from the past 240 hour(s)).   Labs: Basic Metabolic Panel:  Recent Labs Lab 10/29/12 1522 10/30/12 0522 10/31/12 0435 11/01/12 0445  NA 139 140 140 140  K 3.8 3.9 3.7 3.9  CL 106 108 108 108  CO2 27 26 25 25   GLUCOSE 96 127* 134* 117*  BUN 34* 28* 21 20  CREATININE 1.01 0.91 0.86 0.89  CALCIUM 8.7 7.9* 7.3* 7.4*  MG 1.8  --   --   --   PHOS 3.0  --   --   --    Liver Function Tests:  Recent Labs Lab 10/29/12 1522 10/30/12 0522  AST 22 17  ALT 24 21  ALKPHOS 70 68  BILITOT 0.6 0.3  PROT 5.1* 4.8*  ALBUMIN 2.6* 2.4*   CBC:  Recent Labs Lab 10/29/12 1145 10/30/12 0522 10/31/12 0435 11/01/12 0445  WBC 2.6* 1.0* 1.8* 8.6  NEUTROABS 2.0  --   --   --   HGB 12.2* 10.6* 10.3* 10.6*  HCT 37.3* 32.5* 31.4* 32.6*  MCV 101.4* 101.2* 100.6* 100.3*  PLT 80* 59* 54* 61*   CBG:  Recent Labs Lab 10/29/12 1630 10/31/12 0740 11/01/12 0728  GLUCAP 149* 113* 94     SIGNED: Time coordinating discharge: Over 30 minutes  Debbora Presto, MD  [facility] 11/01/2012, 11:01 AM Pager [phone]  If 7PM-7AM, please contact night-coverage [email or web address] Password [email or web address]

## 2012-11-14 ENCOUNTER — Encounter: Payer: Self-pay | Admitting: Physician Assistant

## 2012-11-14 ENCOUNTER — Telehealth: Payer: Self-pay | Admitting: *Deleted

## 2012-11-14 ENCOUNTER — Ambulatory Visit (HOSPITAL_BASED_OUTPATIENT_CLINIC_OR_DEPARTMENT_OTHER): Payer: Medicare Other | Admitting: Physician Assistant

## 2012-11-14 ENCOUNTER — Telehealth: Payer: Self-pay | Admitting: Internal Medicine

## 2012-11-14 ENCOUNTER — Ambulatory Visit: Payer: Medicare Other

## 2012-11-14 ENCOUNTER — Other Ambulatory Visit (HOSPITAL_BASED_OUTPATIENT_CLINIC_OR_DEPARTMENT_OTHER): Payer: Medicare Other | Admitting: Lab

## 2012-11-14 DIAGNOSIS — C3410 Malignant neoplasm of upper lobe, unspecified bronchus or lung: Secondary | ICD-10-CM

## 2012-11-14 DIAGNOSIS — D696 Thrombocytopenia, unspecified: Secondary | ICD-10-CM

## 2012-11-14 DIAGNOSIS — C7951 Secondary malignant neoplasm of bone: Secondary | ICD-10-CM

## 2012-11-14 LAB — CBC WITH DIFFERENTIAL/PLATELET
Basophils Absolute: 0 10*3/uL (ref 0.0–0.1)
Eosinophils Absolute: 0 10*3/uL (ref 0.0–0.5)
HCT: 33.7 % — ABNORMAL LOW (ref 38.4–49.9)
HGB: 11 g/dL — ABNORMAL LOW (ref 13.0–17.1)
MCV: 100.9 fL — ABNORMAL HIGH (ref 79.3–98.0)
NEUT#: 11 10*3/uL — ABNORMAL HIGH (ref 1.5–6.5)
NEUT%: 88.6 % — ABNORMAL HIGH (ref 39.0–75.0)
RDW: 17.5 % — ABNORMAL HIGH (ref 11.0–14.6)
lymph#: 1 10*3/uL (ref 0.9–3.3)

## 2012-11-14 NOTE — Progress Notes (Addendum)
[facility] [phone]   [phone]  SHARED VISIT PROGRESS NOTE  [name], MD [address] [facility], [place]. 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   PRIOR THERAPY:  1) Status post palliative radiotherapy to the right hip and sacrum under the care of Dr. Basilio Cairo.  2) Systemic chemotherapy with carboplatin for AUC of 6 on day 1 and Abraxane 100 mg/M2 on days 1, 8 and 15 every 3 weeks. The patient is status post 5 cycles.  3) Gemcitabine 1000 mg/M2 on days 1 and 8 every 3 weeks. Status post 3 cycles, last dose 10/03/2012, discontinued today secondary to disease progression. 4) stereotactic radiotherapy to multiple brain lesions under the care of Dr. Basilio Cairo completed on 10/21/2012.  CURRENT THERAPY:  1)  Docetaxel 75 mg/M2 every 3 weeks with Neulasta support. First dose today 10/24/2012. 2) Xgeva 120 mg subcutaneously every 4 weeks. Status post fiirst dose given on 08/02/2012.  CHEMOTHERAPY INTENT: Palliative  CURRENT # OF CHEMOTHERAPY CYCLES: 1  CURRENT ANTIEMETICS: Zofran, dexamethasone and Compazine  CURRENT SMOKING STATUS: Former smoker quit 01/15/2012  ORAL CHEMOTHERAPY AND CONSENT: None  CURRENT BISPHOSPHONATES USE: None  PAIN MANAGEMENT: 0/10  NARCOTICS INDUCED CONSTIPATION: None  LIVING WILL AND CODE STATUS: Has living will. Initial full code but not prolonged resuscitation.    INTERVAL HISTORY: Joel Gibson 79 y.o. male returns to the clinic today for followup visit accompanied his wife. He complains of feeling unsteady particularly since he had his SRS procedure. He has had a couple of falls. He describes it more as a sliding down. He denies any pain. In fact is not taking any of his pain medication and about a month. He is currently using a cane to help with his stability and has  access to a walker as well. He is currently completing a taper dexamethasone under the care of Dr. Basilio Cairo. His dose is currently 2 mg twice daily and he will in the next day or 2 decrease to 2 mg once a day. He presents to proceed with his second cycle of systemic chemotherapy with docetaxel with Neulasta support. He continues to complain of fatigue. He denied having any significant nausea or vomiting. He has no fever or chills. The patient denied having any significant chest pain, shortness breath, cough or hemoptysis. The patient has a problem with gastric emptying but states that this has improved to the point where he only takes Reglan at bedtime.Marland Kitchen   MEDICAL HISTORY: Past Medical History  Diagnosis Date  . Hyperlipidemia   . Hypertension   . Aortic stenosis     mild to moderate  . Gastritis   . COPD (chronic obstructive pulmonary disease)   . Bronchitis   . Hepatitis     possible  . Hx of cataract surgery     lt eye  . Tobacco abuse   . Shortness of breath     with activity  . Retinal vein occlusion 1990  . GERD (gastroesophageal reflux disease)   . Hemorrhoid   . Aortic stenosis   . Hypertension   . Dyslipidemia   . Glaucoma   . Right rib fracture      Pathological Fracture / 4 weeks ago  . Lung mass     Right Upper Lobe - Lobular Mass  . SOB (shortness of breath)     Mild  .  Cough   . Stroke 2002    Right slight difference in vision  . S/P radiation therapy 02/22/12 - 03/07/12    Pelvis/Sacrum/30 Gray/10 Fractions and Left Scapula/Rib/ 8 Gray/1 Fraction   . Status post chemotherapy      Carboplatin/ Abraxane  . On antineoplastic chemotherapy     Gemcitabine and Xgeva  . Lung cancer 02/02/2012  . Bone metastases     Widespread/ Lytic Lesion Left 7t Rib, Left Iliac Pelvis, ribs and scapula    ALLERGIES:  has No Known Allergies.  MEDICATIONS:  Current Outpatient Prescriptions  Medication Sig Dispense Refill  . albuterol (PROVENTIL HFA;VENTOLIN HFA) 108 (90 BASE)  MCG/ACT inhaler Inhale 2 puffs into the lungs every 4 (four) hours as needed.      Marland Kitchen amLODipine (NORVASC) 2.5 MG tablet Take 1 tablet (2.5 mg total) by mouth daily.  90 tablet  3  . aspirin EC 81 MG tablet Take 81 mg by mouth every other day. Takes along with Aggrenox      . Aspirin-Dipyridamole (AGGRENOX PO) Take 25-200 mg by mouth 2 (two) times daily. Takes 2 tabs one day alt. with 1 tab next day,etc.      . betaxolol (BETOPTIC-S) 0.25 % ophthalmic suspension Place 1 drop into both eyes daily. One drop in the left eye in the am....one drop in the right eye in the am and in the pm.      . Calcium Carbonate (CALCIUM 600 PO) Take 600 mg by mouth 2 (two) times daily.      Marland Kitchen dexamethasone (DECADRON) 2 MG tablet take 2 tablets by mouth 3 times a day with food and taper as directed  90 tablet  1  . dexamethasone (DECADRON) 4 MG tablet Take 1 tablet (4 mg total) by mouth as directed. 2 tablets BID the day before, day of, and day after chemotherapy  40 tablet  1  . HYDROcodone-homatropine (HYCODAN) 5-1.5 MG/5ML syrup Take 5 mLs by mouth every 6 (six) hours as needed for cough.      . irbesartan (AVAPRO) 300 MG tablet Take 300 mg by mouth daily.       Marland Kitchen lidocaine-prilocaine (EMLA) cream Apply 1 application topically as needed.      . loratadine (CLARITIN) 10 MG tablet Take 10 mg by mouth daily.      Marland Kitchen LORazepam (ATIVAN) 1 MG tablet Take 1 tablet (1 mg total) by mouth at bedtime as needed for anxiety.  30 tablet  3  . metoCLOPramide (REGLAN) 10 MG tablet Take 1 tablet (10 mg total) by mouth 4 (four) times daily.  120 tablet  0  . morphine (MS CONTIN) 15 MG 12 hr tablet Take 1 tablet (15 mg total) by mouth 2 (two) times daily.  60 tablet  0  . Multiple Vitamin (MULTIVITAMIN PO) Take 1 tablet by mouth at bedtime.       . Nutritional Supplements (EQUATE PO) Take by mouth. Daily      . omeprazole (PRILOSEC) 20 MG capsule Take 20 mg by mouth daily. Per Dr. Virgel Manifold      . oxyCODONE (OXY IR/ROXICODONE) 5 MG immediate  release tablet Take 1 tablet (5 mg total) by mouth every 4 (four) hours as needed for pain.  45 tablet  0  . prochlorperazine (COMPAZINE) 10 MG tablet Take 10 mg by mouth every 6 (six) hours as needed. For nausea      . rosuvastatin (CRESTOR) 40 MG tablet Take 20 mg by mouth at bedtime.       Marland Kitchen  tiotropium (SPIRIVA) 18 MCG inhalation capsule Place 18 mcg into inhaler and inhale daily.         No current facility-administered medications for this visit.    SURGICAL HISTORY:  Past Surgical History  Procedure Laterality Date  . Vasectomy    . Tonsillectomy    . Leg surgery      rt for nerve impingement  . Collarbone fracture      1977  . Tonsillectomy    . Eye surgery      catarct bil  . Portacath placement  02/08/2012    Procedure: INSERTION PORT-A-CATH;  Surgeon: [name], MD;  Location: [facility] OR;  Service: Thoracic;  Laterality: Right;  . Ct guided core biopsy  01/26/12    Left Lytic Lesion - Metastatic Squamous Cell Carcinoma  . Skin biopsy  02/09/12    Right Upper Back, shave : Melanoma In Situ Arising  From a Dysplastic Nevus    REVIEW OF SYSTEMS:  A comprehensive review of systems was negative except for: Constitutional: positive for fatigue Neurological: positive for gait problems   PHYSICAL EXAMINATION: General appearance: alert, cooperative, fatigued and no distress Head: Normocephalic, without obvious abnormality, atraumatic Neck: no adenopathy Lymph nodes: Cervical, supraclavicular, and axillary nodes normal. Resp: clear to auscultation bilaterally Cardio: regular rate and rhythm, S1, S2 normal, no murmur, click, rub or gallop GI: soft, non-tender; bowel sounds normal; no masses,  no organomegaly Extremities: extremities normal, atraumatic, no cyanosis or edema Neurologic: Grossly normal  ECOG PERFORMANCE STATUS: 1 - Symptomatic but completely ambulatory  Blood pressure 154/77, pulse 95, temperature 96.9 F (36.1 C), temperature source Oral, resp. rate 20, height  5\' 7"  (1.702 m), weight 147 lb 14.4 oz (67.087 kg).  LABORATORY DATA: Lab Results  Component Value Date   WBC 12.4* 11/14/2012   HGB 11.0* 11/14/2012   HCT 33.7* 11/14/2012   MCV 100.9* 11/14/2012   PLT 69* 11/14/2012      Chemistry      Component Value Date/Time   NA 140 11/01/2012 0445   NA 142 10/24/2012 0801   K 3.9 11/01/2012 0445   K 4.3 10/24/2012 0801   CL 108 11/01/2012 0445   CL 108* 08/29/2012 1100   CO2 25 11/01/2012 0445   CO2 24 10/24/2012 0801   BUN 20 11/01/2012 0445   BUN 30.0* 10/24/2012 0801   CREATININE 0.89 11/01/2012 0445   CREATININE 0.9 10/24/2012 0801      Component Value Date/Time   CALCIUM 7.4* 11/01/2012 0445   CALCIUM 8.5 10/24/2012 0801   ALKPHOS 68 10/30/2012 0522   ALKPHOS 52 10/24/2012 0801   AST 17 10/30/2012 0522   AST 21 10/24/2012 0801   ALT 21 10/30/2012 0522   ALT 27 10/24/2012 0801   BILITOT 0.3 10/30/2012 0522   BILITOT 0.48 10/24/2012 0801       RADIOGRAPHIC STUDIES: Ct Head W Wo Contrast  10/07/2012   *RADIOLOGY REPORT*  Clinical Data: Metastatic lung cancer.  CT HEAD WITHOUT AND WITH CONTRAST  Technique:  Contiguous axial images were obtained from the base of the skull through the vertex without and with intravenous contrast.  Contrast: OMNIPAQUE IOHEXOL 300 MG/ML  SOLN  Comparison: MRI 01/20/2012  Findings: There is no abnormality of the brainstem or cerebellum. The patient has developed a metastatic lesion in the left frontal lobe towards the vertex measuring 24 mm in diameter with some internal necrosis.  There is vasogenic edema within the left frontal white matter with some mass  effect on the frontal horn of the left lateral ventricle.  No second lesion is identified.  No lytic skull lesion is seen.  No evidence of hemorrhage, hydrocephalus or extra-axial collection.  IMPRESSION: 2.4 cm metastasis in the left frontal lobe towards the vertex with left frontal edema.  This report will be handled through our call report mechanism.   Original Report  Authenticated By: Paulina Fusi, M.D.   Ct Chest W Contrast  10/21/2012   *RADIOLOGY REPORT*  Clinical Data:  Restaging non small cell lung cancer.  Brain and bone metastasis.  Chemotherapy ongoing.  CT CHEST, ABDOMEN AND PELVIS WITH CONTRAST  Technique:  Multidetector CT imaging of the chest, abdomen and pelvis was performed following the standard protocol during bolus administration of intravenous contrast.  Contrast: OMNIPAQUE IOHEXOL 300 MG/ML  SOLN  Comparison:  CT 04/20 1014    CT CHEST  Findings:  Right upper lobe pulmonary mass continues to increase in size measuring 35 x 46  mm compared to 30 x 30 mm on prior (image 18).  There is a second 16 mm nodule inferior to the dominant mass (image 21) which is not evident on prior.  There is interval enlargement of the suspicious left axillary lymph node measuring 16 mm compared to 11 mm on prior.  There is a new adjacent enlarged lymph node measuring 10 mm on image 18.  Nodule posterior to the left scapular musculature measuring 8 mm (image 14) compared to 7 mm on prior.  No axillary lymphadenopathy.  No pericardial fluid.  Esophagus is normal.  IMPRESSION:  1.  Interval continued enlargement of right upper lobe pulmonary mass. 2.  New right upper lobe pulmonary nodule. 3.  Interval increase in left axillary adenopathy and nodule posterior to the left scapular musculature consistent  with metastasis.    CT ABDOMEN AND PELVIS  Findings:  There is no focal hepatic lesion.  Gallbladder, pancreas, spleen, adrenal glands, and kidneys are unchanged.  There are multiple nonenhancing cysts within the left and right kidney.  Stomach, small bowel, appendix, and cecum are normal.  The colon rectosigmoid colon are normal.  There are several diverticula the sigmoid colon.  Abdominal aorta normal caliber.  No retroperitoneal periportal lymphadenopathy.  Mesenteric disease.  No peritoneal disease. Nodule in the posterior right pararenal space measuring 9 mm (image 63)  may be slightly increased from 8 mm on prior.  Prostate gland bladder normal.  No pelvic lymphadenopathy.  The large destructive lesion within the sacrum measures 7.2 x 4.0 cm compared to 5.5 x 2.5 cm on prior.  Lytic lesion in the posterior right ilium measures 28 mm not changed from 30 mm on prior.  Lesion within L3  vertebral body has increased in size measuring 3.4 x 2.9 cm increased from 1.7 x 1.5 cm.  This lesion has soft tissue expansion anteriorly and extends to the disc space. The small lesion within the inferior scapula on the left measures 15 mm increased from the 5 mm on prior.  The irregular lesion in the left lateral rib is slightly more sclerotic.  IMPRESSION:  1.  Small nodule within the right retroperitoneal space adjacent to the liver and kidney may be slight increased in size. 2.  Definitive increase in size of skeletal metastasis within the sacrum and L3 vertebral body.  Soft tissue extension from these lesions.  3.  Stable lytic lesion within the right  ilium.  Left scapular left scapular lesion is slightly increased.   Original Report  Authenticated By: Genevive Bi, M.D.   Mr Laqueta Jean ZO Contrast  10/12/2012   *RADIOLOGY REPORT*  Clinical Data: Lung cancer.  Brain mass.  Metastatic disease.  MRI HEAD WITHOUT AND WITH CONTRAST  Technique:  Multiplanar, multiecho pulse sequences of the brain and surrounding structures were obtained according to standard protocol without and with intravenous contrast  Contrast: 14mL MULTIHANCE GADOBENATE DIMEGLUMINE 529 MG/ML IV SOLN  Comparison:  CT 10/07/2012.  MRI 01/20/2012.  Findings: Stereotactic radiosurgery protocol at 3 Tesla.  Enhancing mass lesion in the left frontal lobe over the convexity measures 18 x 25 x 24mm.  This shows evidence of prior hemorrhage. There is irregular enhancement of the mass.  There is a small enhancing satellite lesion proximally 6 mm inferior to the larger lesion.  There is moderate vasogenic edema and mass effect around  the mass.  The left lateral ventricle is depressed due to edema from above.  No other enhancing mass lesion is identified.  Generalized atrophy with mild chronic microvascular ischemia.  No acute infarct.  IMPRESSION: 18 x 25 x 24 mm enhancing mass in the high left frontal lobe compatible with metastatic disease.  There is evidence of prior tumor hemorrhage and there is moderate surrounding white matter edema.  There is a small satellite enhancing lesion deep to the larger lesion also compatible with metastatic disease.  No other enhancing lesions identified.   Original Report Authenticated By: Janeece Riggers, M.D.   ASSESSMENT AND PLAN: This is a very pleasant 80 years old white male with metastatic non-small cell lung cancer, squamous cell carcinoma recently diagnosed with 3 cycles of chemotherapy was carboplatin and gemcitabine but unfortunately has evidence for disease progression. He is now being treated with systemic chemotherapy in the form of docetaxel at 75 mg read square given every 3 weeks with Neulasta support status post 1 cycle. Unfortunately his platelet count is suboptimal to proceed with cycle #2 today at 69,000. Patient was discussed with him also seen by Dr. Arbutus Ped. We'll postpone his son chemotherapy by one week and also followup with him in one week. We will see if his followup appointment with Dr. Basilio Cairo can be moved up for reevaluation of his gait disturbance. He is encouraged to use the walker for more stability.  Laural Benes, [place] Espinoza E, PA-C   He was advised to call immediately if he has any concerning symptoms in the interval.  The patient voices understanding of current disease status and treatment options and is in agreement with the current care plan.  All questions were answered. The patient knows to call the clinic with any problems, questions or concerns. We can certainly see the patient much sooner if necessary.  ADDENDUM: Hematology/Oncology Attending: I have a face to face  encounter with the patient. I recommended his care plan. This is a very pleasant 79 years old white male with metastatic non-small cell lung cancer, squamous cell carcinoma was currently undergoing systemic chemotherapy with single agent docetaxel and tolerated the first cycle of his treatment fairly well except for persistent thrombocytopenia. We will delay the start of cycle #2 until next week to give him more time for recovery of his platelets count. He was advised to call immediately if he has any concerning symptoms in the interval [facility] K., MD 11/15/2012

## 2012-11-14 NOTE — Patient Instructions (Signed)
You chemotherapy is been rescheduled to next week due to your low platelet count We will see if we can get she seen by Dr. Basilio Cairo sooner since you continue to have symptoms related to your treated brain metastasis Followup in one week

## 2012-11-14 NOTE — Telephone Encounter (Signed)
Gave pt appt for lab and ML, chemo cancelled today and moved to 9/15 , emailed Marcelino Duster, left message with Garnette Czech navigator @ Radiation Oncology

## 2012-11-14 NOTE — Telephone Encounter (Signed)
Per staff message and POF I have scheduled appts.  JMW  

## 2012-11-15 ENCOUNTER — Ambulatory Visit: Payer: Medicare Other

## 2012-11-15 ENCOUNTER — Ambulatory Visit (HOSPITAL_COMMUNITY)
Admission: RE | Admit: 2012-11-15 | Discharge: 2012-11-15 | Disposition: A | Discharge status: Home / Self Care | Payer: Medicare Other | Source: Ambulatory Visit | Attending: Radiation Oncology | Admitting: Radiation Oncology

## 2012-11-15 ENCOUNTER — Encounter: Payer: Self-pay | Admitting: Radiation Oncology

## 2012-11-15 ENCOUNTER — Ambulatory Visit
Admission: RE | Admit: 2012-11-15 | Discharge: 2012-11-15 | Disposition: A | Discharge status: Home / Self Care | Payer: Medicare Other | Source: Ambulatory Visit | Attending: Radiation Oncology | Admitting: Radiation Oncology

## 2012-11-15 VITALS — BP 138/63 | HR 91 | Temp 97.4°F | Resp 20 | Ht 67.0 in | Wt 151.6 lb

## 2012-11-15 DIAGNOSIS — I10 Essential (primary) hypertension: Secondary | ICD-10-CM | POA: Insufficient documentation

## 2012-11-15 DIAGNOSIS — E785 Hyperlipidemia, unspecified: Secondary | ICD-10-CM | POA: Insufficient documentation

## 2012-11-15 DIAGNOSIS — C7931 Secondary malignant neoplasm of brain: Secondary | ICD-10-CM

## 2012-11-15 DIAGNOSIS — C3490 Malignant neoplasm of unspecified part of unspecified bronchus or lung: Secondary | ICD-10-CM | POA: Insufficient documentation

## 2012-11-15 DIAGNOSIS — C7951 Secondary malignant neoplasm of bone: Secondary | ICD-10-CM | POA: Insufficient documentation

## 2012-11-15 DIAGNOSIS — Z923 Personal history of irradiation: Secondary | ICD-10-CM | POA: Insufficient documentation

## 2012-11-15 DIAGNOSIS — R269 Unspecified abnormalities of gait and mobility: Secondary | ICD-10-CM | POA: Insufficient documentation

## 2012-11-15 MED ORDER — GADOBENATE DIMEGLUMINE 529 MG/ML IV SOLN
14.0000 mL | Freq: Once | INTRAVENOUS | Status: AC | PRN
  Administered 2012-11-15: 14 mL via INTRAVENOUS

## 2012-11-15 NOTE — Progress Notes (Addendum)
Mr. Sainsbury here today for FU following DRS to his brain on 10/21/12.  He did not have chemotherapy yesterday because of low platelets. He c/o fatigue since the end radiation.  He is ambulating with a cane today.  He reports that he experienced lumbar pain with radiation of the pain to his lower legs last pm and had to take Oxycodone for the 1st time in 1 month.  He reports [place] while [place] beach within the past week and slide down "gently" to his bottom., but no injuries.

## 2012-11-16 ENCOUNTER — Encounter: Payer: Self-pay | Admitting: Radiation Oncology

## 2012-11-16 ENCOUNTER — Other Ambulatory Visit: Payer: Self-pay | Admitting: Radiation Oncology

## 2012-11-16 DIAGNOSIS — C7931 Secondary malignant neoplasm of brain: Secondary | ICD-10-CM

## 2012-11-16 MED ORDER — DEXAMETHASONE 0.5 MG PO TABS: 0.5000 mg | ORAL_TABLET | Freq: Every day | ORAL | Status: AC

## 2012-11-16 NOTE — Progress Notes (Signed)
At wife and pt's request, I left a voicemail with MRI results.  Told them I discussed him with Dr. Newell Coral.  Plan to get repeat Brain MRI in 3 mo, see Nudelman at that time.  Continue steroid taper.  Dr. Newell Coral rec'd continuing lower dose after Sept to prevent withdrawal.  Rx called in to [facility] [address].  Told them to call back if any questions.  -----------------------------------  Lonie Peak, MD

## 2012-11-16 NOTE — Progress Notes (Signed)
Radiation Oncology         ([phone]) [phone] ________________________________  Name: Joel Gibson MRN: [id number]  Date: 11/15/2012  DOB: 1931-10-21  Follow-Up Visit Note  Outpatient  CC: [name], MD  Si Gaul, MD  Diagnosis and Prior Radiotherapy:   On 10-21-12 he received brain SRS:  Left frontal 25mm target was treated using 4 Rapid Arc VMAT Beams to a prescription dose of 18 Gy.  Left 6mm target was treated using the same 4 Rapid Arc VMAT Beams as the adjacent left frontal 25mm lesion to a prescription dose of 20 Gy.  Prior Radiation treatment dates: 02/22/2012-03/07/2012  Site/dose:  1) pelvis and sacrum/30 Wallace Cullens in 10 fractions  2) left scapula and rib/ 8 Gray in 1 fraction  Radiation treatment dates: 08/11/2012-09/03/2012  Site/dose:  1) Pelvis/ sacrum RETREAT / 30 Gy in 15 fractions  2) left Rib RETREAT / 30 Gy in 12 fractions    Narrative:  The patient returns today for premature follow-up due to gait instability.  Received SRS to his brain on 10/21/12. He did not have chemotherapy yesterday because of low platelets. He c/o fatigue since the end radiation. He is ambulating with a cane today.   He has been following the Decadron Taper as Rx'd: (2mg  per tablet) Dexamethasone taper:  Take 2 tablets three times a day through 10/28/12  On 8/23 start taking 2 tablets at breakfast and lunch only  On 9/6 start taking 1 tablet at breakfast and lunch only  On 9/13 start taking 1 tablet at breakfast only  Stop taking dexamethasone on 11/26/12.  He reports he was doing "great" until last Thursday 9/4 (still taking 4mg  BID decadron at the time) when he noted weakness in her legs bilaterally, R worst than Left.  However, the Right Foot Dragging that preceded SRS to the brain is better than it had been.  He also notes some worsening of dizziness when he closes his eyes.  This preceded SRS, then improved, and is now worse.  No major visual changes. No HA's or nausea.  Able to  cross his legs better than weeks ago (hip pain relatively good).  No GI/GU issues.  No numbness in extremities.  He had to take Oxycodone for the 1st time in 1 month due to sacral pain - but this is not a shooting pain, he reports.  It's in the sacral area and right SI joint region. He reports a fall while at the beach within the past week and slide down "gently" to his bottom., but no injuries.  Cannot get up independently if sitting on floor.  Also, new LE edema bilaterally.    Underwent CT imaging of  CAP recently (see reports below) which shows diffuse progression of systemic disease.  Notably, The large destructive lesion within the sacrum measures 7.2 x 4.0 cm compared to 5.5 x 2.5 cm on prior.  Lytic lesion in the posterior right ilium measures 28 mm not changed from 30 mm on prior.  Lesion within L3  vertebral body has increased in size measuring 3.4 x 2.9 cm increased from 1.7 x 1.5 cm.  This lesion has soft tissue expansion anteriorly and extends to the disc space.   He is not receiving PT, currently.  Recently recovered from pharyngitis / thrush and was admitted to hospital for this.  ALLERGIES:  has No Known Allergies.  Meds: Current Outpatient Prescriptions  Medication Sig Dispense Refill  . albuterol (PROVENTIL HFA;VENTOLIN HFA) 108 (90 BASE) MCG/ACT inhaler Inhale 2 puffs into the lungs every 4 (four) hours as needed.      . Calcium Carbonate (CALCIUM 600 PO) Take 600 mg by mouth 2 (two) times daily.      Marland Kitchen dexamethasone (DECADRON) 2 MG tablet take 2 tablets by mouth 3 times a day with food and taper as directed  90 tablet  1  . dexamethasone (DECADRON) 4 MG tablet Take 1 tablet (4 mg total) by mouth as directed. 2 tablets BID the day before, day of, and day after chemotherapy  40 tablet  1  . HYDROcodone-homatropine (HYCODAN) 5-1.5 MG/5ML syrup Take 5 mLs by mouth every 6 (six) hours as needed for cough.      . irbesartan (AVAPRO) 300 MG tablet Take 300 mg  by mouth daily.       Marland Kitchen lidocaine-prilocaine (EMLA) cream Apply 1 application topically as needed.      . loratadine (CLARITIN) 10 MG tablet Take 10 mg by mouth daily.      Marland Kitchen LORazepam (ATIVAN) 1 MG tablet Take 1 tablet (1 mg total) by mouth at bedtime as needed for anxiety.  30 tablet  3  . metoCLOPramide (REGLAN) 10 MG tablet Take 1 tablet (10 mg total) by mouth 4 (four) times daily.  120 tablet  0  . morphine (MS CONTIN) 15 MG 12 hr tablet Take 1 tablet (15 mg total) by mouth 2 (two) times daily.  60 tablet  0  . Multiple Vitamin (MULTIVITAMIN PO) Take 1 tablet by mouth at bedtime.       . Nutritional Supplements (EQUATE PO) Take by mouth. Daily      . omeprazole (PRILOSEC) 20 MG capsule Take 20 mg by mouth daily. Per Dr. Virgel Manifold      . oxyCODONE (OXY IR/ROXICODONE) 5 MG immediate release tablet Take 1 tablet (5 mg total) by mouth every 4 (four) hours as needed for pain.  45 tablet  0  . rosuvastatin (CRESTOR) 40 MG tablet Take 20 mg by mouth at bedtime.       Marland Kitchen tiotropium (SPIRIVA) 18 MCG inhalation capsule Place 18 mcg into inhaler and inhale daily.        Marland Kitchen amLODipine (NORVASC) 2.5 MG tablet Take 1 tablet (2.5 mg total) by mouth daily.  90 tablet  3  . aspirin EC 81 MG tablet Take 81 mg by mouth every other day. Takes along with Aggrenox      . Aspirin-Dipyridamole (AGGRENOX PO) Take 25-200 mg by mouth 2 (two) times daily. Takes 2 tabs one day alt. with 1 tab next day,etc.      . betaxolol (BETOPTIC-S) 0.25 % ophthalmic suspension Place 1 drop into both eyes daily. One drop in the left eye in the am....one drop in the right eye in the am and in the pm.      . prochlorperazine (COMPAZINE) 10 MG tablet Take 10 mg by mouth every 6 (six) hours as needed. For nausea       No current facility-administered medications for this encounter.    Physical Findings: The patient is in no acute distress. Patient is alert and oriented.  height is 5\' 7"  (1.702 m) and weight is 151 lb 9.6 oz (68.765 kg). His  temperature is 97.4 F (36.3 C). His blood pressure is 138/63 and his pulse is 91. His respiration is  20 and oxygen saturation is 98%. Gilmer Mor to ambulate.  Able to stand from seated position.  Modest LE edema.  Bilateral weakness in legs - R worse than left. No numbness. No oral thrush.  No obvious CN deficits. Tender in Upper sacrum  And Right parasacral region.  Lab Findings: Lab Results  Component Value Date   WBC 12.4* 11/14/2012   HGB 11.0* 11/14/2012   HCT 33.7* 11/14/2012   MCV 100.9* 11/14/2012   PLT 69* 11/14/2012    Radiographic Findings: Dg Neck Soft Tissue  10/29/2012   *RADIOLOGY REPORT*  Clinical Data: Allergic reaction, feels like throat is closing up after taking new medications  NECK SOFT TISSUES - 1+ VIEW  Comparison: None  Findings: Mild thickening of the epiglottis and aryepiglottic folds. Question mildly prominent prevertebral soft tissues at C5-C6. Prevertebral soft tissues anterior to the C1-C4 appear normal in thickness. Airway appears patent on single lateral view. Bones appear demineralized. Central line noted.  IMPRESSION: Mild soft tissue swelling involving the epiglottis, aryepiglottic folds and questionably the prevertebral soft tissues at the inferior cervical region.   Original Report Authenticated By: Ulyses Southward, M.D.   Ct Chest W Contrast  10/21/2012   *RADIOLOGY REPORT*  Clinical Data:  Restaging non small cell lung cancer.  Brain and bone metastasis.  Chemotherapy ongoing.  CT CHEST, ABDOMEN AND PELVIS WITH CONTRAST  Technique:  Multidetector CT imaging of the chest, abdomen and pelvis was performed following the standard protocol during bolus administration of intravenous contrast.  Contrast: OMNIPAQUE IOHEXOL 300 MG/ML  SOLN  Comparison:  CT 04/20 1014  CT CHEST  Findings:  Right upper lobe pulmonary mass continues to increase in size measuring 35 x 46  mm compared to 30 x 30 mm on prior (image 18).  There is a second 16 mm nodule inferior to the dominant mass  (image 21) which is not evident on prior.  There is interval enlargement of the suspicious left axillary lymph node measuring 16 mm compared to 11 mm on prior.  There is a new adjacent enlarged lymph node measuring 10 mm on image 18.  Nodule posterior to the left scapular musculature measuring 8 mm (image 14) compared to 7 mm on prior.  No axillary lymphadenopathy.  No pericardial fluid.  Esophagus is normal.  IMPRESSION:  1.  Interval continued enlargement of right upper lobe pulmonary mass. 2.  New right upper lobe pulmonary nodule. 3.  Interval increase in left axillary adenopathy and nodule posterior to the left scapular musculature consistent  with metastasis.  CT ABDOMEN AND PELVIS  Findings:  There is no focal hepatic lesion.  Gallbladder, pancreas, spleen, adrenal glands, and kidneys are unchanged.  There are multiple nonenhancing cysts within the left and right kidney.  Stomach, small bowel, appendix, and cecum are normal.  The colon rectosigmoid colon are normal.  There are several diverticula the sigmoid colon.  Abdominal aorta normal caliber.  No retroperitoneal periportal lymphadenopathy.  Mesenteric disease.  No peritoneal disease. Nodule in the posterior right pararenal space measuring 9 mm (image 63) may be slightly increased from 8 mm on prior.  Prostate gland bladder normal.  No pelvic lymphadenopathy.  The large destructive lesion within the sacrum measures 7.2 x 4.0 cm compared to 5.5 x 2.5 cm on prior.  Lytic lesion in the posterior right ilium measures 28 mm not changed from 30 mm on prior.  Lesion within L3  vertebral body has increased in size measuring 3.4 x 2.9  cm increased from 1.7 x 1.5 cm.  This lesion has soft tissue expansion anteriorly and extends to the disc space. The small lesion within the inferior scapula on the left measures 15 mm increased from the 5 mm on prior.  The irregular lesion in the left lateral rib is slightly more sclerotic.  IMPRESSION:  1.  Small nodule within the  right retroperitoneal space adjacent to the liver and kidney may be slight increased in size. 2.  Definitive increase in size of skeletal metastasis within the sacrum and L3 vertebral body.  Soft tissue extension from these lesions.  3.  Stable lytic lesion within the right  ilium.  Left scapular left scapular lesion is slightly increased.   Original Report Authenticated By: Genevive Bi, M.D.   Mr Laqueta Jean Wo Contrast  11/16/2012   *RADIOLOGY REPORT*  Clinical Data: History of non-small cell lung cancer with brain and bone metastatic disease.  Post stereotactic radiation therapy.  New gait instability.  Hypertensive hyperlipidemic smoker.  MRI HEAD WITHOUT AND WITH CONTRAST  Technique:  Multiplanar, multiecho pulse sequences of the brain and surrounding structures were obtained according to standard protocol without and with intravenous contrast  Contrast: 14mL MULTIHANCE GADOBENATE DIMEGLUMINE 529 MG/ML IV SOLN  Comparison: Several prior MR is most recent 10/12/2012.  Findings: 1.5 Tesla versus prior 3 Tesla imaging.  No acute infarct.  Superior mid left frontal mass has decreased in size measuring 1.9 x 1.5 x 1.7 cm versus prior 2.6 x 2.1 x 2.4 cm.  Immediately inferior to this level, decreased size of satellite lesion now with maximal dimension of 3.7 mm versus prior 5.3 mm.  Decrease in surrounding T2 altered signal intensity.  Small amount of blood breakdown products within the dominant left frontal lobe lesion less prominent than on the prior exam.  No new parenchymal mass.  Prominent white matter type changes separate from the above described tumoral disease suggestive of result of small vessel disease/result of treatment of tumor.  Global atrophy without hydrocephalus.  Major intracranial vascular structures are patent.  IMPRESSION: Interval improvement of intracranial metastatic disease involving the left frontal lobe as detailed above without new metastatic lesion detected.   Original Report  Authenticated By: Lacy Duverney, M.D.   Ct Abdomen Pelvis W Contrast  10/21/2012   *RADIOLOGY REPORT*  Clinical Data:  Restaging non small cell lung cancer.  Brain and bone metastasis.  Chemotherapy ongoing.  CT CHEST, ABDOMEN AND PELVIS WITH CONTRAST  Technique:  Multidetector CT imaging of the chest, abdomen and pelvis was performed following the standard protocol during bolus administration of intravenous contrast.  Contrast: OMNIPAQUE IOHEXOL 300 MG/ML  SOLN  Comparison:  CT 04/20 1014  CT CHEST  Findings:  Right upper lobe pulmonary mass continues to increase in size measuring 35 x 46  mm compared to 30 x 30 mm on prior (image 18).  There is a second 16 mm nodule inferior to the dominant mass (image 21) which is not evident on prior.  There is interval enlargement of the suspicious left axillary lymph node measuring 16 mm compared to 11 mm on prior.  There is a new adjacent enlarged lymph node measuring 10 mm on image 18.  Nodule posterior to the left scapular musculature measuring 8 mm (image 14) compared to 7 mm on prior.  No axillary lymphadenopathy.  No pericardial fluid.  Esophagus is normal.  IMPRESSION:  1.  Interval continued enlargement of right upper lobe pulmonary mass. 2.  New right upper  lobe pulmonary nodule. 3.  Interval increase in left axillary adenopathy and nodule posterior to the left scapular musculature consistent  with metastasis.  CT ABDOMEN AND PELVIS  Findings:  There is no focal hepatic lesion.  Gallbladder, pancreas, spleen, adrenal glands, and kidneys are unchanged.  There are multiple nonenhancing cysts within the left and right kidney.  Stomach, small bowel, appendix, and cecum are normal.  The colon rectosigmoid colon are normal.  There are several diverticula the sigmoid colon.  Abdominal aorta normal caliber.  No retroperitoneal periportal lymphadenopathy.  Mesenteric disease.  No peritoneal disease. Nodule in the posterior right pararenal space measuring 9 mm (image  63) may be slightly increased from 8 mm on prior.  Prostate gland bladder normal.  No pelvic lymphadenopathy.  The large destructive lesion within the sacrum measures 7.2 x 4.0 cm compared to 5.5 x 2.5 cm on prior.  Lytic lesion in the posterior right ilium measures 28 mm not changed from 30 mm on prior.  Lesion within L3  vertebral body has increased in size measuring 3.4 x 2.9 cm increased from 1.7 x 1.5 cm.  This lesion has soft tissue expansion anteriorly and extends to the disc space. The small lesion within the inferior scapula on the left measures 15 mm increased from the 5 mm on prior.  The irregular lesion in the left lateral rib is slightly more sclerotic.  IMPRESSION:  1.  Small nodule within the right retroperitoneal space adjacent to the liver and kidney may be slight increased in size. 2.  Definitive increase in size of skeletal metastasis within the sacrum and L3 vertebral body.  Soft tissue extension from these lesions.  3.  Stable lytic lesion within the right  ilium.  Left scapular left scapular lesion is slightly increased.   Original Report Authenticated By: Genevive Bi, M.D.    Impression/Plan:  Main issue as described above is gait instability.  I suspect this is most likely due to muscle atrophy from Dexamethasone.  As a precaution, I ordered an MRI which was done the evening of his followup with me.  See report above.  It shows improvement in the lesion and surrounding edema - no new disease.  This is reassuring.  I do not think his symptoms seem neuropathic to suggest they are from his sacral disease which progressed on CT.  This has been treated twice with RT.  I doubt there is a high yield of benefit for ordering an MRI of this area.  I think it is prudent to continue steroid taper, and this ends on Sept 20th.  Wife reports they are eligible for home PT through [employer] and she is going to call them to arrange such services.  I encouraged this and told them I would write a Rx for PT if  needed.    Will discuss follow-up plans with Dr. Newell Coral - pt's Neurosurgeon - and inform him of MRI results.  I spent 20 minutes face to face with the patient and more than 50% of that time was spent in counseling and/or coordination of care. _____________________________________   Lonie Peak, MD

## 2012-11-18 ENCOUNTER — Other Ambulatory Visit: Payer: Self-pay | Admitting: Radiation Oncology

## 2012-11-18 ENCOUNTER — Other Ambulatory Visit: Payer: Self-pay | Admitting: Radiation Therapy

## 2012-11-18 DIAGNOSIS — C7931 Secondary malignant neoplasm of brain: Secondary | ICD-10-CM

## 2012-11-21 ENCOUNTER — Other Ambulatory Visit (HOSPITAL_BASED_OUTPATIENT_CLINIC_OR_DEPARTMENT_OTHER): Payer: Medicare Other | Admitting: Lab

## 2012-11-21 ENCOUNTER — Ambulatory Visit: Payer: Medicare Other | Admitting: Lab

## 2012-11-21 ENCOUNTER — Telehealth: Payer: Self-pay | Admitting: Internal Medicine

## 2012-11-21 ENCOUNTER — Ambulatory Visit (HOSPITAL_BASED_OUTPATIENT_CLINIC_OR_DEPARTMENT_OTHER): Payer: Medicare Other | Admitting: Physician Assistant

## 2012-11-21 ENCOUNTER — Encounter: Payer: Self-pay | Admitting: Physician Assistant

## 2012-11-21 ENCOUNTER — Ambulatory Visit: Payer: Medicare Other

## 2012-11-21 DIAGNOSIS — C3490 Malignant neoplasm of unspecified part of unspecified bronchus or lung: Secondary | ICD-10-CM

## 2012-11-21 LAB — CBC WITH DIFFERENTIAL/PLATELET
Basophils Absolute: 0 10*3/uL (ref 0.0–0.1)
Eosinophils Absolute: 0 10*3/uL (ref 0.0–0.5)
HCT: 31.8 % — ABNORMAL LOW (ref 38.4–49.9)
LYMPH%: 4.3 % — ABNORMAL LOW (ref 14.0–49.0)
MONO#: 0.7 10*3/uL (ref 0.1–0.9)
NEUT#: 11.5 10*3/uL — ABNORMAL HIGH (ref 1.5–6.5)
NEUT%: 90.2 % — ABNORMAL HIGH (ref 39.0–75.0)
Platelets: 67 10*3/uL — ABNORMAL LOW (ref 140–400)
WBC: 12.8 10*3/uL — ABNORMAL HIGH (ref 4.0–10.3)

## 2012-11-21 LAB — COMPREHENSIVE METABOLIC PANEL (CC13)
BUN: 20.3 mg/dL (ref 7.0–26.0)
CO2: 27 mEq/L (ref 22–29)
Creatinine: 0.8 mg/dL (ref 0.7–1.3)
Glucose: 127 mg/dl (ref 70–140)
Total Bilirubin: 0.33 mg/dL (ref 0.20–1.20)
Total Protein: 5.7 g/dL — ABNORMAL LOW (ref 6.4–8.3)

## 2012-11-21 NOTE — Patient Instructions (Signed)
A Veristrat test was performed on you today to see if we can use an oral chemotherapy medication called Tarceva. We will discuss those results when you return in 2 weeks to see Dr. Arbutus Ped. We will also recheck your blood counts at that time Cipro platelet count has recovered.

## 2012-11-21 NOTE — Progress Notes (Addendum)
[facility] [phone]   [phone]  SHARED VISIT PROGRESS NOTE  [name], MD [address] [facility], [place]. 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   PRIOR THERAPY:  1) Status post palliative radiotherapy to the right hip and sacrum under the care of Dr. Basilio Cairo.  2) Systemic chemotherapy with carboplatin for AUC of 6 on day 1 and Abraxane 100 mg/M2 on days 1, 8 and 15 every 3 weeks. The patient is status post 5 cycles.  3) Gemcitabine 1000 mg/M2 on days 1 and 8 every 3 weeks. Status post 3 cycles, last dose 10/03/2012, discontinued today secondary to disease progression. 4) stereotactic radiotherapy to multiple brain lesions under the care of Dr. Basilio Cairo completed on 10/21/2012.  CURRENT THERAPY:  1)  Docetaxel 75 mg/M2 every 3 weeks with Neulasta support. First dose today 10/24/2012. 2) Xgeva 120 mg subcutaneously every 4 weeks. Status post fiirst dose given on 08/02/2012.  CHEMOTHERAPY INTENT: Palliative  CURRENT # OF CHEMOTHERAPY CYCLES: 1  CURRENT ANTIEMETICS: Zofran, dexamethasone and Compazine  CURRENT SMOKING STATUS: Former smoker quit 01/15/2012  ORAL CHEMOTHERAPY AND CONSENT: None  CURRENT BISPHOSPHONATES USE: None  PAIN MANAGEMENT: 0/10  NARCOTICS INDUCED CONSTIPATION: None  LIVING WILL AND CODE STATUS: Has living will. Initial full code but not prolonged resuscitation.    INTERVAL HISTORY: NUCHEM GRATTAN 79 y.o. male returns to the clinic today for followup visit accompanied his wife. He reports that he is feeling significant bleed better and has not felt unsteady [place]ove dated his previous visit. He had one day where he woke up with low back pain and nausea but this was relieved after taking one of his pain pills. Overall he does feel significantly better. He states that he is  currently taking Ativan 1 mg at bedtime to help him sleep. He is able to take this at around 11:00 PM and sleep until about 6 or 6:30 AM. This was prescribed by Dr. Lenise Arena. He is currently completing a taper dexamethasone under the care of Dr. Basilio Cairo.  He presents to proceed with his second cycle of systemic chemotherapy with docetaxel with Neulasta support. He was held last week due to thrombocytopenia. He continues to complain of fatigue. He denied having any significant nausea or vomiting. He has no fever or chills. The patient denied having any significant chest pain, shortness breath, cough or hemoptysis. The patient has a problem with gastric emptying but states that this has improved to the point where he only takes Reglan at bedtime. He does complain of some increased abdominal gassiness/bloating. He is passing gas and bowel movements are regular.  MEDICAL HISTORY: Past Medical History  Diagnosis Date  . Hyperlipidemia   . Hypertension   . Aortic stenosis     mild to moderate  . Gastritis   . COPD (chronic obstructive pulmonary disease)   . Bronchitis   . Hepatitis     possible  . Hx of cataract surgery     lt eye  . Tobacco abuse   . Shortness of breath     with activity  . Retinal vein occlusion 1990  . GERD (gastroesophageal reflux disease)   . Hemorrhoid   . Aortic stenosis   . Hypertension   . Dyslipidemia   . Glaucoma   . Right rib fracture      Pathological Fracture / 4 weeks  ago  . Lung mass     Right Upper Lobe - Lobular Mass  . SOB (shortness of breath)     Mild  . Cough   . Stroke 2002    Right slight difference in vision  . S/P radiation therapy 02/22/12 - 03/07/12    Pelvis/Sacrum/30 Gray/10 Fractions and Left Scapula/Rib/ 8 Gray/1 Fraction   . Status post chemotherapy      Carboplatin/ Abraxane  . On antineoplastic chemotherapy     Gemcitabine and Xgeva  . Lung cancer 02/02/2012  . Bone metastases     Widespread/ Lytic Lesion Left 7t Rib, Left Iliac  Pelvis, ribs and scapula    ALLERGIES:  has No Known Allergies.  MEDICATIONS:  Current Outpatient Prescriptions  Medication Sig Dispense Refill  . rosuvastatin (CRESTOR) 20 MG tablet Take 20 mg by mouth daily.      Marland Kitchen albuterol (PROVENTIL HFA;VENTOLIN HFA) 108 (90 BASE) MCG/ACT inhaler Inhale 2 puffs into the lungs every 4 (four) hours as needed.      Marland Kitchen amLODipine (NORVASC) 2.5 MG tablet Take 1 tablet (2.5 mg total) by mouth daily.  90 tablet  3  . aspirin EC 81 MG tablet Take 81 mg by mouth every other day. Takes along with Aggrenox      . Aspirin-Dipyridamole (AGGRENOX PO) Take 25-200 mg by mouth 2 (two) times daily. Takes 2 tabs one day alt. with 1 tab next day,etc.      . betaxolol (BETOPTIC-S) 0.25 % ophthalmic suspension Place 1 drop into both eyes daily. One drop in the left eye in the am....one drop in the right eye in the am and in the pm.      . Calcium Carbonate (CALCIUM 600 PO) Take 600 mg by mouth 2 (two) times daily.      Marland Kitchen dexamethasone (DECADRON) 0.5 MG tablet Take 1 tablet (0.5 mg total) by mouth daily with breakfast. Start Sept20th, take through Oct10th to prevent withdrawal symptoms from steroid  21 tablet  0  . dexamethasone (DECADRON) 2 MG tablet take 2 tablets by mouth 3 times a day with food and taper as directed  90 tablet  1  . dexamethasone (DECADRON) 4 MG tablet Take 1 tablet (4 mg total) by mouth as directed. 2 tablets BID the day before, day of, and day after chemotherapy  40 tablet  1  . HYDROcodone-homatropine (HYCODAN) 5-1.5 MG/5ML syrup Take 5 mLs by mouth every 6 (six) hours as needed for cough.      . irbesartan (AVAPRO) 300 MG tablet Take 300 mg by mouth daily.       Marland Kitchen lidocaine-prilocaine (EMLA) cream Apply 1 application topically as needed.      . loratadine (CLARITIN) 10 MG tablet Take 10 mg by mouth daily.      Marland Kitchen LORazepam (ATIVAN) 1 MG tablet Take 1 tablet (1 mg total) by mouth at bedtime as needed for anxiety.  30 tablet  3  . metoCLOPramide (REGLAN) 10  MG tablet Take 1 tablet (10 mg total) by mouth 4 (four) times daily.  120 tablet  0  . morphine (MS CONTIN) 15 MG 12 hr tablet Take 1 tablet (15 mg total) by mouth 2 (two) times daily.  60 tablet  0  . Multiple Vitamin (MULTIVITAMIN PO) Take 1 tablet by mouth at bedtime.       . Nutritional Supplements (EQUATE PO) Take by mouth. Daily      . omeprazole (PRILOSEC) 20 MG capsule Take 20 mg by  mouth daily. Per Dr. Virgel Manifold      . oxyCODONE (OXY IR/ROXICODONE) 5 MG immediate release tablet Take 1 tablet (5 mg total) by mouth every 4 (four) hours as needed for pain.  45 tablet  0  . prochlorperazine (COMPAZINE) 10 MG tablet Take 10 mg by mouth every 6 (six) hours as needed. For nausea      . tiotropium (SPIRIVA) 18 MCG inhalation capsule Place 18 mcg into inhaler and inhale daily.         No current facility-administered medications for this visit.    SURGICAL HISTORY:  Past Surgical History  Procedure Laterality Date  . Vasectomy    . Tonsillectomy    . Leg surgery      rt for nerve impingement  . Collarbone fracture      1977  . Tonsillectomy    . Eye surgery      catarct bil  . Portacath placement  02/08/2012    Procedure: INSERTION PORT-A-CATH;  Surgeon: [name], MD;  Location: [facility] OR;  Service: Thoracic;  Laterality: Right;  . Ct guided core biopsy  01/26/12    Left Lytic Lesion - Metastatic Squamous Cell Carcinoma  . Skin biopsy  02/09/12    Right Upper Back, shave : Melanoma In Situ Arising  From a Dysplastic Nevus    REVIEW OF SYSTEMS:  A comprehensive review of systems was negative except for: Constitutional: positive for fatigue Gastrointestinal: positive for nausea Musculoskeletal: positive for back pain   PHYSICAL EXAMINATION: General appearance: alert, cooperative, fatigued and no distress Head: Normocephalic, without obvious abnormality, atraumatic Neck: no adenopathy Lymph nodes: Cervical, supraclavicular, and axillary nodes normal. Resp: clear to auscultation  bilaterally Cardio: regular rate and rhythm, S1, S2 normal, no murmur, click, rub or gallop GI: soft, non-tender; bowel sounds normal; no masses,  no organomegaly Extremities: extremities normal, atraumatic, no cyanosis or edema Neurologic: Grossly normal  ECOG PERFORMANCE STATUS: 1 - Symptomatic but completely ambulatory  Blood pressure 159/83, pulse 88, temperature 97.5 F (36.4 C), temperature source Oral, resp. rate 20, height 5\' 7"  (1.702 m), weight 151 lb 4.8 oz (68.629 kg).  LABORATORY DATA: Lab Results  Component Value Date   WBC 12.8* 11/21/2012   HGB 10.4* 11/21/2012   HCT 31.8* 11/21/2012   MCV 102.6* 11/21/2012   PLT 67* 11/21/2012      Chemistry      Component Value Date/Time   NA 142 11/21/2012 1335   NA 140 11/01/2012 0445   K 4.2 11/21/2012 1335   K 3.9 11/01/2012 0445   CL 108 11/01/2012 0445   CL 108* 08/29/2012 1100   CO2 27 11/21/2012 1335   CO2 25 11/01/2012 0445   BUN 20.3 11/21/2012 1335   BUN 20 11/01/2012 0445   CREATININE 0.8 11/21/2012 1335   CREATININE 0.89 11/01/2012 0445      Component Value Date/Time   CALCIUM 9.1 11/21/2012 1335   CALCIUM 7.4* 11/01/2012 0445   ALKPHOS 62 11/21/2012 1335   ALKPHOS 68 10/30/2012 0522   AST 29 11/21/2012 1335   AST 17 10/30/2012 0522   ALT 48 11/21/2012 1335   ALT 21 10/30/2012 0522   BILITOT 0.33 11/21/2012 1335   BILITOT 0.3 10/30/2012 0522       RADIOGRAPHIC STUDIES: Ct Head W Wo Contrast  10/07/2012   *RADIOLOGY REPORT*  Clinical Data: Metastatic lung cancer.  CT HEAD WITHOUT AND WITH CONTRAST  Technique:  Contiguous axial images were obtained from the base of the skull  through the vertex without and with intravenous contrast.  Contrast: OMNIPAQUE IOHEXOL 300 MG/ML  SOLN  Comparison: MRI 01/20/2012  Findings: There is no abnormality of the brainstem or cerebellum. The patient has developed a metastatic lesion in the left frontal lobe towards the vertex measuring 24 mm in diameter with some internal necrosis.  There is  vasogenic edema within the left frontal white matter with some mass effect on the frontal horn of the left lateral ventricle.  No second lesion is identified.  No lytic skull lesion is seen.  No evidence of hemorrhage, hydrocephalus or extra-axial collection.  IMPRESSION: 2.4 cm metastasis in the left frontal lobe towards the vertex with left frontal edema.  This report will be handled through our call report mechanism.   Original Report Authenticated By: Paulina Fusi, M.D.   Ct Chest W Contrast  10/21/2012   *RADIOLOGY REPORT*  Clinical Data:  Restaging non small cell lung cancer.  Brain and bone metastasis.  Chemotherapy ongoing.  CT CHEST, ABDOMEN AND PELVIS WITH CONTRAST  Technique:  Multidetector CT imaging of the chest, abdomen and pelvis was performed following the standard protocol during bolus administration of intravenous contrast.  Contrast: OMNIPAQUE IOHEXOL 300 MG/ML  SOLN  Comparison:  CT 04/20 1014    CT CHEST  Findings:  Right upper lobe pulmonary mass continues to increase in size measuring 35 x 46  mm compared to 30 x 30 mm on prior (image 18).  There is a second 16 mm nodule inferior to the dominant mass (image 21) which is not evident on prior.  There is interval enlargement of the suspicious left axillary lymph node measuring 16 mm compared to 11 mm on prior.  There is a new adjacent enlarged lymph node measuring 10 mm on image 18.  Nodule posterior to the left scapular musculature measuring 8 mm (image 14) compared to 7 mm on prior.  No axillary lymphadenopathy.  No pericardial fluid.  Esophagus is normal.  IMPRESSION:  1.  Interval continued enlargement of right upper lobe pulmonary mass. 2.  New right upper lobe pulmonary nodule. 3.  Interval increase in left axillary adenopathy and nodule posterior to the left scapular musculature consistent  with metastasis.    CT ABDOMEN AND PELVIS  Findings:  There is no focal hepatic lesion.  Gallbladder, pancreas, spleen, adrenal glands, and  kidneys are unchanged.  There are multiple nonenhancing cysts within the left and right kidney.  Stomach, small bowel, appendix, and cecum are normal.  The colon rectosigmoid colon are normal.  There are several diverticula the sigmoid colon.  Abdominal aorta normal caliber.  No retroperitoneal periportal lymphadenopathy.  Mesenteric disease.  No peritoneal disease. Nodule in the posterior right pararenal space measuring 9 mm (image 63) may be slightly increased from 8 mm on prior.  Prostate gland bladder normal.  No pelvic lymphadenopathy.  The large destructive lesion within the sacrum measures 7.2 x 4.0 cm compared to 5.5 x 2.5 cm on prior.  Lytic lesion in the posterior right ilium measures 28 mm not changed from 30 mm on prior.  Lesion within L3  vertebral body has increased in size measuring 3.4 x 2.9 cm increased from 1.7 x 1.5 cm.  This lesion has soft tissue expansion anteriorly and extends to the disc space. The small lesion within the inferior scapula on the left measures 15 mm increased from the 5 mm on prior.  The irregular lesion in the left lateral rib is slightly more sclerotic.  IMPRESSION:  1.  Small nodule within the right retroperitoneal space adjacent to the liver and kidney may be slight increased in size. 2.  Definitive increase in size of skeletal metastasis within the sacrum and L3 vertebral body.  Soft tissue extension from these lesions.  3.  Stable lytic lesion within the right  ilium.  Left scapular left scapular lesion is slightly increased.   Original Report Authenticated By: Genevive Bi, M.D.   Mr Laqueta Jean ZO Contrast  10/12/2012   *RADIOLOGY REPORT*  Clinical Data: Lung cancer.  Brain mass.  Metastatic disease.  MRI HEAD WITHOUT AND WITH CONTRAST  Technique:  Multiplanar, multiecho pulse sequences of the brain and surrounding structures were obtained according to standard protocol without and with intravenous contrast  Contrast: 14mL MULTIHANCE GADOBENATE DIMEGLUMINE 529 MG/ML  IV SOLN  Comparison:  CT 10/07/2012.  MRI 01/20/2012.  Findings: Stereotactic radiosurgery protocol at 3 Tesla.  Enhancing mass lesion in the left frontal lobe over the convexity measures 18 x 25 x 24mm.  This shows evidence of prior hemorrhage. There is irregular enhancement of the mass.  There is a small enhancing satellite lesion proximally 6 mm inferior to the larger lesion.  There is moderate vasogenic edema and mass effect around the mass.  The left lateral ventricle is depressed due to edema from above.  No other enhancing mass lesion is identified.  Generalized atrophy with mild chronic microvascular ischemia.  No acute infarct.  IMPRESSION: 18 x 25 x 24 mm enhancing mass in the high left frontal lobe compatible with metastatic disease.  There is evidence of prior tumor hemorrhage and there is moderate surrounding white matter edema.  There is a small satellite enhancing lesion deep to the larger lesion also compatible with metastatic disease.  No other enhancing lesions identified.   Original Report Authenticated By: Janeece Riggers, M.D.   ASSESSMENT AND PLAN: This is a very pleasant 79 years old white male with metastatic non-small cell lung cancer, squamous cell carcinoma recently diagnosed with 3 cycles of chemotherapy was carboplatin and gemcitabine but unfortunately has evidence for disease progression. He is now being treated with systemic chemotherapy in the form of docetaxel at 75 mg read square given every 3 weeks with Neulasta support status post 1 cycle. Unfortunately his platelet count is again suboptimal to proceed with cycle #2 today at 67,000. Patient was discussed with him also seen by Dr. Arbutus Ped. We'll postpone his  chemotherapy by two week and also followup with him in two weeks. We discussed obtaining a Veristrat test on him today to see if he would be a candidate to use an oral chemotherapy drug such as Tarceva. If the test result 6 come back is good he would indicate that he would  respond well to Tarceva, however if the results come back for he would not do well on Tarceva. The patient and his wife are in agreement with proceeding with this test. He'll follow with Dr. Arbutus Ped in 2 weeks with repeat CBC differential and C. met to see if his platelets have recovered and also to discuss the results of the Veristrat test.   Laural Benes, Phoenix Dresser E, PA-C   He was advised to call immediately if he has any concerning symptoms in the interval.  The patient voices understanding of current disease status and treatment options and is in agreement with the current care plan.  All questions were answered. The patient knows to call the clinic with any problems, questions or concerns. We can certainly  see the patient much sooner if necessary.  ADDENDUM: Hematology/Oncology Attending: I have a face-to-face encounter with the patient today. I recommended his care. The patient came to the clinic today accompanied by his wife for followup visit. Unfortunately his platelets count are still after the first cycle of chemotherapy with single agent docetaxel. I will continue to hold his chemotherapy for now. I discussed with the patient other option for his treatment including consideration of treatment with oral Tarceva. I will order a Veristrat test to evaluate his response to Tarceva.  The patient would come back for followup visit in 2 weeks for evaluation and discussion of his lab results and further recommendations regarding treatment of his condition. He was advised to call immediately if he has any concerning symptoms in the interval. Lajuana Matte., MD 11/21/2012

## 2012-11-21 NOTE — Telephone Encounter (Signed)
Pt sent back to lab and given schedule for September.

## 2012-11-22 ENCOUNTER — Ambulatory Visit: Payer: Medicare Other

## 2012-11-22 ENCOUNTER — Ambulatory Visit: Payer: Medicare Other | Admitting: Radiation Oncology

## 2012-11-24 ENCOUNTER — Telehealth: Payer: Self-pay | Admitting: Medical Oncology

## 2012-11-24 LAB — VERISTRAT

## 2012-11-24 NOTE — Telephone Encounter (Addendum)
FYI -Level I drug interaction between compazine and Reglan causing tardive dyskinesis. Will ask RN /PA to f/u .

## 2012-11-25 ENCOUNTER — Encounter: Payer: Self-pay | Admitting: Physician Assistant

## 2012-11-25 ENCOUNTER — Other Ambulatory Visit: Payer: Self-pay | Admitting: Physician Assistant

## 2012-11-25 NOTE — Progress Notes (Signed)
Spoke with patient about possible negative interaction between his Compazine and the Reglan. He is currently only taken one Reglan at bedtime. Patient states that he will discontinue the Reglan until he has a followup appointment back here in our office. Other antiemetic and motility options will be discussed at that visit.  Laural Benes, Korde Jeppsen E, PA-C

## 2012-11-28 ENCOUNTER — Other Ambulatory Visit (HOSPITAL_BASED_OUTPATIENT_CLINIC_OR_DEPARTMENT_OTHER): Payer: Medicare Other

## 2012-11-28 ENCOUNTER — Telehealth: Payer: Self-pay | Admitting: *Deleted

## 2012-11-28 DIAGNOSIS — C3410 Malignant neoplasm of upper lobe, unspecified bronchus or lung: Secondary | ICD-10-CM

## 2012-11-28 DIAGNOSIS — C3491 Malignant neoplasm of unspecified part of right bronchus or lung: Secondary | ICD-10-CM

## 2012-11-28 DIAGNOSIS — C7951 Secondary malignant neoplasm of bone: Secondary | ICD-10-CM

## 2012-11-28 LAB — COMPREHENSIVE METABOLIC PANEL (CC13)
ALT: 29 U/L (ref 0–55)
CO2: 26 mEq/L (ref 22–29)
Chloride: 107 mEq/L (ref 98–109)
Creatinine: 0.9 mg/dL (ref 0.7–1.3)
Sodium: 145 mEq/L (ref 136–145)

## 2012-11-28 LAB — CBC WITH DIFFERENTIAL/PLATELET
BASO%: 0.6 % (ref 0.0–2.0)
HCT: 29 % — ABNORMAL LOW (ref 38.4–49.9)
HGB: 9.7 g/dL — ABNORMAL LOW (ref 13.0–17.1)
MCHC: 33.3 g/dL (ref 32.0–36.0)
MONO#: 0.5 10*3/uL (ref 0.1–0.9)
NEUT%: 78.1 % — ABNORMAL HIGH (ref 39.0–75.0)
WBC: 5.9 10*3/uL (ref 4.0–10.3)
lymph#: 0.7 10*3/uL — ABNORMAL LOW (ref 0.9–3.3)

## 2012-11-28 MED ORDER — OXYCODONE HCL 5 MG PO TABS: 5.0000 mg | ORAL_TABLET | ORAL | Status: DC | PRN

## 2012-11-28 MED ORDER — AZITHROMYCIN 1 G PO PACK: 1.0000 | PACK | Freq: Once | ORAL | Status: DC

## 2012-11-28 NOTE — Telephone Encounter (Signed)
Pt in the lobby stating that he has a productive cough with yellow mucous.  No fever, pt states temp has been 98.0.  CBC from today reviewed by Dr Donnald Garre and Tiana Loft.  Pt is going to the beach tomorrow so he does not want to get sick while on vacation.  Per Tiana Loft and Dr Donnald Garre, pt may have z-pack.  Pt verbalized understanding and is aware to call if he develops fever 100.5.  SLJ

## 2012-12-02 ENCOUNTER — Telehealth: Payer: Self-pay | Admitting: *Deleted

## 2012-12-02 NOTE — Telephone Encounter (Signed)
Pt's wife called stating that pt had a temp this morning ranging from 100.2- 100.8.  His throat is sore.  Pt's temp now is 98.9.  Per AJ, okay to keep watching pt but he needs to go to the ED if his fever returns.  They verbalized understanding.  SLJ

## 2012-12-04 ENCOUNTER — Emergency Department (HOSPITAL_COMMUNITY)
Admission: EM | Admit: 2012-12-04 | Discharge: 2012-12-04 | Disposition: A | Discharge status: Home / Self Care | Payer: Medicare Other | Attending: Emergency Medicine | Admitting: Emergency Medicine

## 2012-12-04 ENCOUNTER — Emergency Department (HOSPITAL_COMMUNITY): Payer: Medicare Other

## 2012-12-04 ENCOUNTER — Telehealth: Payer: Self-pay | Admitting: Internal Medicine

## 2012-12-04 ENCOUNTER — Encounter (HOSPITAL_COMMUNITY): Payer: Self-pay | Admitting: Emergency Medicine

## 2012-12-04 DIAGNOSIS — J029 Acute pharyngitis, unspecified: Secondary | ICD-10-CM | POA: Insufficient documentation

## 2012-12-04 DIAGNOSIS — Z923 Personal history of irradiation: Secondary | ICD-10-CM | POA: Insufficient documentation

## 2012-12-04 DIAGNOSIS — Z8781 Personal history of (healed) traumatic fracture: Secondary | ICD-10-CM | POA: Insufficient documentation

## 2012-12-04 DIAGNOSIS — R509 Fever, unspecified: Secondary | ICD-10-CM | POA: Insufficient documentation

## 2012-12-04 DIAGNOSIS — E785 Hyperlipidemia, unspecified: Secondary | ICD-10-CM | POA: Insufficient documentation

## 2012-12-04 DIAGNOSIS — J449 Chronic obstructive pulmonary disease, unspecified: Secondary | ICD-10-CM | POA: Insufficient documentation

## 2012-12-04 DIAGNOSIS — C7951 Secondary malignant neoplasm of bone: Secondary | ICD-10-CM | POA: Insufficient documentation

## 2012-12-04 DIAGNOSIS — J4489 Other specified chronic obstructive pulmonary disease: Secondary | ICD-10-CM | POA: Insufficient documentation

## 2012-12-04 DIAGNOSIS — Z8669 Personal history of other diseases of the nervous system and sense organs: Secondary | ICD-10-CM | POA: Insufficient documentation

## 2012-12-04 DIAGNOSIS — Z87891 Personal history of nicotine dependence: Secondary | ICD-10-CM | POA: Insufficient documentation

## 2012-12-04 DIAGNOSIS — I10 Essential (primary) hypertension: Secondary | ICD-10-CM | POA: Insufficient documentation

## 2012-12-04 DIAGNOSIS — Z9221 Personal history of antineoplastic chemotherapy: Secondary | ICD-10-CM | POA: Insufficient documentation

## 2012-12-04 DIAGNOSIS — Z7982 Long term (current) use of aspirin: Secondary | ICD-10-CM | POA: Insufficient documentation

## 2012-12-04 DIAGNOSIS — Z8673 Personal history of transient ischemic attack (TIA), and cerebral infarction without residual deficits: Secondary | ICD-10-CM | POA: Insufficient documentation

## 2012-12-04 DIAGNOSIS — C7800 Secondary malignant neoplasm of unspecified lung: Secondary | ICD-10-CM | POA: Insufficient documentation

## 2012-12-04 DIAGNOSIS — IMO0002 Reserved for concepts with insufficient information to code with codable children: Secondary | ICD-10-CM | POA: Insufficient documentation

## 2012-12-04 DIAGNOSIS — Z8589 Personal history of malignant neoplasm of other organs and systems: Secondary | ICD-10-CM | POA: Insufficient documentation

## 2012-12-04 DIAGNOSIS — K219 Gastro-esophageal reflux disease without esophagitis: Secondary | ICD-10-CM | POA: Insufficient documentation

## 2012-12-04 DIAGNOSIS — Z79899 Other long term (current) drug therapy: Secondary | ICD-10-CM | POA: Insufficient documentation

## 2012-12-04 LAB — CBC WITH DIFFERENTIAL/PLATELET
Basophils Relative: 0 % (ref 0–1)
Eosinophils Absolute: 0.1 10*3/uL (ref 0.0–0.7)
Eosinophils Relative: 1 % (ref 0–5)
HCT: 24.1 % — ABNORMAL LOW (ref 39.0–52.0)
Hemoglobin: 7.7 g/dL — ABNORMAL LOW (ref 13.0–17.0)
Lymphs Abs: 0.5 10*3/uL — ABNORMAL LOW (ref 0.7–4.0)
MCH: 32.9 pg (ref 26.0–34.0)
MCHC: 32 g/dL (ref 30.0–36.0)
MCV: 103 fL — ABNORMAL HIGH (ref 78.0–100.0)
Monocytes Absolute: 0.6 10*3/uL (ref 0.1–1.0)
Neutro Abs: 5 10*3/uL (ref 1.7–7.7)
RBC: 2.34 MIL/uL — ABNORMAL LOW (ref 4.22–5.81)

## 2012-12-04 LAB — COMPREHENSIVE METABOLIC PANEL
Albumin: 2.2 g/dL — ABNORMAL LOW (ref 3.5–5.2)
Alkaline Phosphatase: 83 U/L (ref 39–117)
BUN: 15 mg/dL (ref 6–23)
CO2: 27 mEq/L (ref 19–32)
Chloride: 99 mEq/L (ref 96–112)
GFR calc non Af Amer: 78 mL/min — ABNORMAL LOW (ref >90)
Glucose, Bld: 143 mg/dL — ABNORMAL HIGH (ref 70–99)
Potassium: 3.8 mEq/L (ref 3.5–5.1)
Total Bilirubin: 0.4 mg/dL (ref 0.3–1.2)

## 2012-12-04 LAB — URINALYSIS, ROUTINE W REFLEX MICROSCOPIC
Glucose, UA: NEGATIVE mg/dL
Ketones, ur: NEGATIVE mg/dL
Leukocytes, UA: NEGATIVE
Protein, ur: NEGATIVE mg/dL
Urobilinogen, UA: 0.2 mg/dL (ref 0.0–1.0)

## 2012-12-04 MED ORDER — HEPARIN SOD (PORK) LOCK FLUSH 100 UNIT/ML IV SOLN
500.0000 [IU] | Freq: Once | INTRAVENOUS | Status: AC
  Administered 2012-12-04: 500 [IU]
  Filled 2012-12-04: qty 5

## 2012-12-04 MED ORDER — AMOXICILLIN-POT CLAVULANATE 875-125 MG PO TABS: 1.0000 | ORAL_TABLET | Freq: Two times a day (BID) | ORAL | Status: DC

## 2012-12-04 MED ORDER — LIDOCAINE VISCOUS 2 % MT SOLN: OROMUCOSAL | Status: DC

## 2012-12-04 MED ORDER — FLUCONAZOLE 150 MG PO TABS: 150.0000 mg | ORAL_TABLET | Freq: Once | ORAL | Status: DC

## 2012-12-04 NOTE — ED Provider Notes (Signed)
CSN: [id number]     Arrival date & time 12/04/12  1050 History   First MD Initiated Contact with Patient 12/04/12 1105     Chief Complaint  Patient presents with  . Fever  . Sore Throat   (Consider location/radiation/quality/duration/timing/severity/associated sxs/prior Treatment) HPI  Joel Gibson is a 79 year old male with stage IV metastatic lung cancer.  He presents today with chief complaint of sore throat and fever.  The patient was admitted earlier this month for uvulitis.  He was discharged with a diagnosis of oral thrush and given nystatin mouthwash.  The patient completed a course of treatment with nystatin and was doing well however he has continued to have sore throat.  Patient went away to the beach this weekend and developed a fever.  He did speak with the physician assistant in oncology who had him monitor the fever.  The patient's wife states that today he decided to return last night because his fever continued.  This morning his fever was up to 101.  He has no other complaints today a side from the sore throat.  He denies any difficulty breathing.  He does have pain with swallowing however is able to tolerate liquids and soft foods.  His pain is worse on the right side.  Past Medical History  Diagnosis Date  . Hyperlipidemia   . Hypertension   . Aortic stenosis     mild to moderate  . Gastritis   . COPD (chronic obstructive pulmonary disease)   . Bronchitis   . Hepatitis     possible  . Hx of cataract surgery     lt eye  . Tobacco abuse   . Shortness of breath     with activity  . Retinal vein occlusion 1990  . GERD (gastroesophageal reflux disease)   . Hemorrhoid   . Aortic stenosis   . Hypertension   . Dyslipidemia   . Glaucoma   . Right rib fracture      Pathological Fracture / 4 weeks ago  . Lung mass     Right Upper Lobe - Lobular Mass  . SOB (shortness of breath)     Mild  . Cough   . Stroke 2002    Right slight difference in vision  . S/P  radiation therapy 02/22/12 - 03/07/12    Pelvis/Sacrum/30 Gray/10 Fractions and Left Scapula/Rib/ 8 Gray/1 Fraction   . Status post chemotherapy      Carboplatin/ Abraxane  . On antineoplastic chemotherapy     Gemcitabine and Xgeva  . Lung cancer 02/02/2012  . Bone metastases     Widespread/ Lytic Lesion Left 7t Rib, Left Iliac Pelvis, ribs and scapula   Past Surgical History  Procedure Laterality Date  . Vasectomy    . Tonsillectomy    . Leg surgery      rt for nerve impingement  . Collarbone fracture      1977  . Tonsillectomy    . Eye surgery      catarct bil  . Portacath placement  02/08/2012    Procedure: INSERTION PORT-A-CATH;  Surgeon: [name], MD;  Location: [facility] OR;  Service: Thoracic;  Laterality: Right;  . Ct guided core biopsy  01/26/12    Left Lytic Lesion - Metastatic Squamous Cell Carcinoma  . Skin biopsy  02/09/12    Right Upper Back, shave : Melanoma In Situ Arising  From a Dysplastic Nevus   Family History  Problem Relation Age of Onset  . Heart attack  Father     x7  . Throat cancer Father     Deceased from Staph Infection  . Heart attack Father     7 Times  . Leukemia Mother    History  Substance Use Topics  . Smoking status: Former Smoker -- 2.00 packs/day for 65 years    Types: Cigarettes    Quit date: 01/15/2012  . Smokeless tobacco: Never Used  . Alcohol Use: Yes     Comment: occassional    Review of Systems  Constitutional: Positive for fever. Negative for chills.  HENT: Positive for sore throat.   Respiratory: Negative for cough and shortness of breath.   Cardiovascular: Negative for chest pain and palpitations.  Gastrointestinal: Negative for vomiting, abdominal pain, diarrhea and constipation.  Genitourinary: Negative for dysuria, urgency and frequency.  Musculoskeletal: Negative for myalgias and arthralgias.  Skin: Negative for rash.  Neurological: Negative for headaches.    Allergies  Review of patient's allergies indicates  no known allergies.  Home Medications   Current Outpatient Rx  Name  Route  Sig  Dispense  Refill  . albuterol (PROVENTIL HFA;VENTOLIN HFA) 108 (90 BASE) MCG/ACT inhaler   Inhalation   Inhale 2 puffs into the lungs every 4 (four) hours as needed.         Marland Kitchen amLODipine (NORVASC) 2.5 MG tablet   Oral   Take 1 tablet (2.5 mg total) by mouth daily.   90 tablet   3   . aspirin EC 81 MG tablet   Oral   Take 81 mg by mouth every other day. Takes along with Aggrenox         . Aspirin-Dipyridamole (AGGRENOX PO)   Oral   Take 25-200 mg by mouth 2 (two) times daily. Takes 2 tabs one day alt. with 1 tab next day,etc.         . azithromycin (ZITHROMAX) 1 G powder   Oral   Take 1 packet by mouth once. Take as directed   1 each   0   . betaxolol (BETOPTIC-S) 0.25 % ophthalmic suspension   Both Eyes   Place 1 drop into both eyes daily. One drop in the left eye in the am....one drop in the right eye in the am and in the pm.         . Calcium Carbonate (CALCIUM 600 PO)   Oral   Take 600 mg by mouth 2 (two) times daily.         Marland Kitchen dexamethasone (DECADRON) 0.5 MG tablet   Oral   Take 1 tablet (0.5 mg total) by mouth daily with breakfast. Start Sept20th, take through Oct10th to prevent withdrawal symptoms from steroid   21 tablet   0   . dexamethasone (DECADRON) 2 MG tablet      take 2 tablets by mouth 3 times a day with food and taper as directed   90 tablet   1   . dexamethasone (DECADRON) 4 MG tablet   Oral   Take 1 tablet (4 mg total) by mouth as directed. 2 tablets BID the day before, day of, and day after chemotherapy   40 tablet   1   . HYDROcodone-homatropine (HYCODAN) 5-1.5 MG/5ML syrup   Oral   Take 5 mLs by mouth every 6 (six) hours as needed for cough.         . irbesartan (AVAPRO) 300 MG tablet   Oral   Take 300 mg by mouth daily.          Marland Kitchen  lidocaine-prilocaine (EMLA) cream   Topical   Apply 1 application topically as needed.         .  loratadine (CLARITIN) 10 MG tablet   Oral   Take 10 mg by mouth daily.         Marland Kitchen LORazepam (ATIVAN) 1 MG tablet   Oral   Take 1 tablet (1 mg total) by mouth at bedtime as needed for anxiety.   30 tablet   3   . metoCLOPramide (REGLAN) 10 MG tablet   Oral   Take 1 tablet (10 mg total) by mouth 4 (four) times daily.   120 tablet   0   . morphine (MS CONTIN) 15 MG 12 hr tablet   Oral   Take 1 tablet (15 mg total) by mouth 2 (two) times daily.   60 tablet   0   . Multiple Vitamin (MULTIVITAMIN PO)   Oral   Take 1 tablet by mouth at bedtime.          . Nutritional Supplements (EQUATE PO)   Oral   Take by mouth. Daily         . omeprazole (PRILOSEC) 20 MG capsule   Oral   Take 20 mg by mouth daily. Per Dr. Virgel Manifold         . oxyCODONE (OXY IR/ROXICODONE) 5 MG immediate release tablet   Oral   Take 1 tablet (5 mg total) by mouth every 4 (four) hours as needed for pain.   45 tablet   0   . prochlorperazine (COMPAZINE) 10 MG tablet   Oral   Take 10 mg by mouth every 6 (six) hours as needed. For nausea         . rosuvastatin (CRESTOR) 20 MG tablet   Oral   Take 20 mg by mouth daily.         Marland Kitchen tiotropium (SPIRIVA) 18 MCG inhalation capsule   Inhalation   Place 18 mcg into inhaler and inhale daily.            BP 151/72  Pulse 126  Temp(Src) 98.6 F (37 C) (Oral)  Resp 16  SpO2 92% Physical Exam  Nursing note and vitals reviewed. Constitutional: He is oriented to person, place, and time. He appears well-developed and well-nourished. No distress.  HENT:  Head: Normocephalic and atraumatic.  Mouth/Throat: Uvula is midline. He does not have dentures. No oral lesions. No trismus in the jaw. Normal dentition. Edematous present. No lacerations. Posterior oropharyngeal erythema present. No oropharyngeal exudate, posterior oropharyngeal edema or tonsillar abscesses.  Geographic tongue with coating. Uvula is edematous and erythematous.  No sign of peritonsillar  abscess.  No exudates present.   Eyes: Conjunctivae are normal. No scleral icterus.  Neck: Normal range of motion. Neck supple.  Cardiovascular: Normal rate, regular rhythm and normal heart sounds.   Pulmonary/Chest: Effort normal and breath sounds normal. No respiratory distress.  Abdominal: Soft. There is no tenderness.  Musculoskeletal: He exhibits no edema.  Lymphadenopathy:    He has no cervical adenopathy.  Neurological: He is alert and oriented to person, place, and time.  Skin: Skin is warm and dry. He is not diaphoretic.  Mild scattered petechiae  Psychiatric: His behavior is normal.    ED Course  Procedures (including critical care time) Labs Review Labs Reviewed  CBC WITH DIFFERENTIAL - Abnormal; Notable for the following:    RBC 2.34 (*)    Hemoglobin 7.7 (*)    HCT 24.1 (*)  MCV 103.0 (*)    RDW 16.5 (*)    Platelets 72 (*)    Neutrophils Relative % 81 (*)    Lymphocytes Relative 8 (*)    Lymphs Abs 0.5 (*)    All other components within normal limits  COMPREHENSIVE METABOLIC PANEL - Abnormal; Notable for the following:    Glucose, Bld 143 (*)    Total Protein 5.8 (*)    Albumin 2.2 (*)    GFR calc non Af Amer 78 (*)    All other components within normal limits  CG4 I-STAT (LACTIC ACID) - Abnormal; Notable for the following:    Lactic Acid, Venous 2.49 (*)    All other components within normal limits  RAPID STREP SCREEN  URINALYSIS, ROUTINE W REFLEX MICROSCOPIC  CG4 I-STAT (LACTIC ACID)   Imaging Review Dg Chest 2 View  12/04/2012   CLINICAL DATA:  Initial encounter for current episode of fever and sore throat. Current history of metastatic right upper lobe lung cancer for which the patient is receiving chemotherapy and radiation therapy (last chemotherapy treatment 10/24/2012 and last brain radiation treatment 10/17/2012).  EXAM: CHEST  2 VIEW  COMPARISON:  CT chest 10/21/2012, 07/04/2012, 04/15/2012. One view chest x-ray 03/10/2012.  FINDINGS: Medial  right upper lobe lung mass similar in appearance to the 10/21/2012 CT. The adjacent satellite nodule identified on CT is not visible on the PA view due to its position immediately anterior to the larger mass, but is vaguely visible on the lateral. No new pulmonary parenchymal nodules or masses. Patchy opacities in the right upper lobe adjacent to the large mass, unchanged, likely post-obstructive pneumonitis. No new pulmonary parenchymal abnormalities. No pleural effusions. Degenerative changes involving the thoracic spine. Healed or healing fractures involving the left 7th lateral rib and right posterolateral 7th and lateral 8th ribs and healed right clavicular fracture as noted on the CT. Cardiac silhouette normal in size, unchanged. Thoracic aorta mildly atherosclerotic, unchanged. Hilar and mediastinal contours otherwise unremarkable. Right jugular Port-A-Cath tip in the mid SVC.  IMPRESSION: 1. Mass involving the medial left upper lobe with adjacent probable post-obstructive pneumonitis, not significantly changed in appearance since the most recent CT 10/21/2012. 2. No new/acute cardiopulmonary disease.   Electronically Signed   By: Hulan Saas   On: 12/04/2012 12:58    MDM   1. Fever   2. Pharyngitis    Mako Pelfrey returns today with fever, sore throat.  His chest x-ray and urine are negative.  White count is normal no sign of neutropenia.  He does have a mild, cellular left shift.  Patient continues to have thrombocytopenia. Initial lactate was incorrect his actual lactate is 1.39 and not elevated.  The patient appears well.  Sprayed history with Cetacaine. I placed call to consult with oncology.  Rapid strep is pending.  I have spoken with Dr. Rosie Fate. Who reviewed the patient's lab's and chart with me. Although the patient reports fever he has been afebrile here at the ED. The patient has been able to tolerate fluids.  Per Dr. Rosie Fate, patient should be discharged with coverage for URI and  will receive augmentin at discharge as well as Diflucan.  Viscous lidocaine.  I have reviewed the patient's labs and imaging with him and his family. They feel comfortable with plan of discharge. Return precautions discussed. Close follow up with Dr. Arbutus Ped tomorrow.   Arthor Captain, PA-C 12/04/12 1603

## 2012-12-04 NOTE — ED Notes (Signed)
I stat Lactic elevated - PA Abigail aware 1304.

## 2012-12-04 NOTE — ED Notes (Signed)
I stat Lactic was not elevated.  Tube was 1 hour old ( read the time wrong )  Lab was drawn and re ran.  Lactic was WNL.  PA Abigail and Dr Criss Alvine aware.  Edit sheet faxed.

## 2012-12-04 NOTE — Telephone Encounter (Signed)
I received a call at 1:45 pm to inquire about his sore throat.  PA abigail stated that his throat was sore despite magic mouthwash and that he was febrile at home (101).  His WBC is 6.2K.  He is scheduled for chemotherapy tomorrow.  PLAN: I discussed starting him on diflucan given history of thrush and I brief course of augmentin.  He has follow-up with Dr. Shirline Frees tomorrow.  He was also to report to emergency room with persistence in his symptoms or persistent fever.  CXR, UA and port site was within normal limits.

## 2012-12-04 NOTE — ED Provider Notes (Signed)
Medical screening examination/treatment/procedure(s) were conducted as a shared visit with non-physician practitioner(s) and myself.  I personally evaluated the patient during the encounter   Patient is well appearing, given fluids for hydration. Has signs of pharyngitis but no PTA, and has normal neck ROM and no swelling or severe pain to suggest RPA. No fever here. Will get blood cultures and discuss with oncology for their recs on treatment.   Audree Camel, MD 12/04/12 [phone]

## 2012-12-04 NOTE — ED Notes (Signed)
I stat Lactic elevated - PA Abigail aware 1304.  

## 2012-12-04 NOTE — ED Notes (Addendum)
Patient with Hx of cancer, chemo, and radiation reports to ED for fever of 101F and a sore throat, which patient states is how he felt the last time he was hospitalized here. Patient reports falling recently for unknown reason, states he did not faint or lose consciousness before or after fall, states he believes his thrush is returning. Patient reports that last chemo treatment was 10-24-12 and last brain radiation treatment was 10-17-12. 3+ pitting edema bilaterally to feet, HR 120, brown/yellow material present on tongue. No fever noted currently, patient denies taking any antipyretics.

## 2012-12-05 ENCOUNTER — Ambulatory Visit: Payer: Medicare Other

## 2012-12-05 ENCOUNTER — Telehealth: Payer: Self-pay | Admitting: Internal Medicine

## 2012-12-05 ENCOUNTER — Telehealth: Payer: Self-pay | Admitting: Medical Oncology

## 2012-12-05 ENCOUNTER — Other Ambulatory Visit (HOSPITAL_BASED_OUTPATIENT_CLINIC_OR_DEPARTMENT_OTHER): Payer: Medicare Other

## 2012-12-05 ENCOUNTER — Ambulatory Visit (HOSPITAL_BASED_OUTPATIENT_CLINIC_OR_DEPARTMENT_OTHER): Payer: Medicare Other

## 2012-12-05 ENCOUNTER — Encounter: Payer: Self-pay | Admitting: Internal Medicine

## 2012-12-05 ENCOUNTER — Ambulatory Visit (HOSPITAL_BASED_OUTPATIENT_CLINIC_OR_DEPARTMENT_OTHER): Payer: Medicare Other | Admitting: Internal Medicine

## 2012-12-05 ENCOUNTER — Other Ambulatory Visit: Payer: Medicare Other | Admitting: Lab

## 2012-12-05 DIAGNOSIS — C3410 Malignant neoplasm of upper lobe, unspecified bronchus or lung: Secondary | ICD-10-CM

## 2012-12-05 DIAGNOSIS — C3491 Malignant neoplasm of unspecified part of right bronchus or lung: Secondary | ICD-10-CM

## 2012-12-05 DIAGNOSIS — C3490 Malignant neoplasm of unspecified part of unspecified bronchus or lung: Secondary | ICD-10-CM

## 2012-12-05 DIAGNOSIS — C7951 Secondary malignant neoplasm of bone: Secondary | ICD-10-CM

## 2012-12-05 LAB — CBC WITH DIFFERENTIAL/PLATELET
BASO%: 0.4 % (ref 0.0–2.0)
EOS%: 0 % (ref 0.0–7.0)
HGB: 8.4 g/dL — ABNORMAL LOW (ref 13.0–17.1)
MCH: 32.1 pg (ref 27.2–33.4)
MCHC: 31.1 g/dL — ABNORMAL LOW (ref 32.0–36.0)
MCV: 103.1 fL — ABNORMAL HIGH (ref 79.3–98.0)
MONO#: 0.3 10*3/uL (ref 0.1–0.9)
NEUT#: 7.2 10*3/uL — ABNORMAL HIGH (ref 1.5–6.5)
NEUT%: 87.3 % — ABNORMAL HIGH (ref 39.0–75.0)
RBC: 2.62 10*6/uL — ABNORMAL LOW (ref 4.20–5.82)
RDW: 16.6 % — ABNORMAL HIGH (ref 11.0–14.6)
WBC: 8.3 10*3/uL (ref 4.0–10.3)
lymph#: 0.8 10*3/uL — ABNORMAL LOW (ref 0.9–3.3)
nRBC: 1 % — ABNORMAL HIGH (ref 0–0)

## 2012-12-05 MED ORDER — DENOSUMAB 120 MG/1.7ML ~~LOC~~ SOLN
120.0000 mg | Freq: Once | SUBCUTANEOUS | Status: AC
  Administered 2012-12-05: 120 mg via SUBCUTANEOUS
  Filled 2012-12-05: qty 1.7

## 2012-12-05 MED ORDER — FLUCONAZOLE 100 MG PO TABS: 100.0000 mg | ORAL_TABLET | Freq: Every day | ORAL | Status: DC

## 2012-12-05 MED ORDER — ERLOTINIB HCL 150 MG PO TABS: 150.0000 mg | ORAL_TABLET | Freq: Every day | ORAL | Status: AC

## 2012-12-05 MED ORDER — FLUCONAZOLE 100 MG PO TABS: 100.0000 mg | ORAL_TABLET | Freq: Once | ORAL | Status: DC

## 2012-12-05 NOTE — Progress Notes (Signed)
[facility] [phone]   [phone]  OFFICE PROGRESS NOTE  [name], MD [address] [facility], [place]. 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  Veristrat test Good.  PRIOR THERAPY:  1) Status post palliative radiotherapy to the right hip and sacrum under the care of Dr. Basilio Cairo.  2) Systemic chemotherapy with carboplatin for AUC of 6 on day 1 and Abraxane 100 mg/M2 on days 1, 8 and 15 every 3 weeks. The patient is status post 5 cycles.  3) Gemcitabine 1000 mg/M2 on days 1 and 8 every 3 weeks. Status post 3 cycles, last dose 10/03/2012, discontinued today secondary to disease progression.  4) stereotactic radiotherapy to multiple brain lesions under the care of Dr. Basilio Cairo completed on 10/21/2012.  5) Docetaxel 75 mg/M2 every 3 weeks with Neulasta support. First dose today 10/24/2012. Discontinued secondary to intolerance and persistent thrombocytopenia.  CURRENT THERAPY:  1) Tarceva 150 mg by mouth daily 2) Xgeva 120 mg subcutaneously every 4 weeks. Status post fiirst dose given on 08/02/2012.   CHEMOTHERAPY INTENT: Palliative  CURRENT # OF CHEMOTHERAPY CYCLES: 1  CURRENT ANTIEMETICS: Zofran, dexamethasone and Compazine  CURRENT SMOKING STATUS: Former smoker quit 01/15/2012  ORAL CHEMOTHERAPY AND CONSENT: Tarceva and consent was obtained today.  CURRENT BISPHOSPHONATES USE: Xgeva  PAIN MANAGEMENT: 0/10  NARCOTICS INDUCED CONSTIPATION: None  LIVING WILL AND CODE STATUS: Has living will. Initial full code but not prolonged resuscitation.  INTERVAL HISTORY: ELISHUA RADFORD 79 y.o. male returns to the clinic today for follow up visit. The patient has been complaining recently of sore throat as well as fever at home up to 101. He was treated recently with Z-Pak but it was given to him as one single dose  over 1000 mg rather than the traditional Z-Pak of 6 tablet. The patient also has oral thrush and he was treated by Dr. Rosie Fate yesterday with Diflucan 150 mg single dose. He is feeling much better today. Chest x-ray and urinalysis in the emergency room yesterday were unremarkable. The patient came today for evaluation and recommendation regarding his condition. He continues to have persistent thrombocytopenia secondary to his recent chemotherapy with single agent docetaxel. He denied having any significant fever or chills today. He has no chest pain but continues to have mild cough with no hemoptysis. He has no significant weight loss or night sweats. He has a recent Veristrat test and the result was good.  MEDICAL HISTORY: Past Medical History  Diagnosis Date  . Hyperlipidemia   . Hypertension   . Aortic stenosis     mild to moderate  . Gastritis   . COPD (chronic obstructive pulmonary disease)   . Bronchitis   . Hepatitis     possible  . Hx of cataract surgery     lt eye  . Tobacco abuse   . Shortness of breath     with activity  . Retinal vein occlusion 1990  . GERD (gastroesophageal reflux disease)   . Hemorrhoid   . Aortic stenosis   . Hypertension   . Dyslipidemia   . Glaucoma   . Right rib fracture      Pathological Fracture / 4 weeks ago  . Lung mass     Right Upper Lobe - Lobular Mass  . SOB (shortness of breath)     Mild  . Cough   .  Stroke 2002    Right slight difference in vision  . S/P radiation therapy 02/22/12 - 03/07/12    Pelvis/Sacrum/30 Gray/10 Fractions and Left Scapula/Rib/ 8 Gray/1 Fraction   . Status post chemotherapy      Carboplatin/ Abraxane  . On antineoplastic chemotherapy     Gemcitabine and Xgeva  . Lung cancer 02/02/2012  . Bone metastases     Widespread/ Lytic Lesion Left 7t Rib, Left Iliac Pelvis, ribs and scapula    ALLERGIES:  has No Known Allergies.  MEDICATIONS:  Current Outpatient Prescriptions  Medication Sig Dispense Refill    . amLODipine (NORVASC) 2.5 MG tablet Take 1 tablet (2.5 mg total) by mouth daily.  90 tablet  3  . amoxicillin-clavulanate (AUGMENTIN) 875-125 MG per tablet Take 1 tablet by mouth 2 (two) times daily.  20 tablet  0  . aspirin EC 81 MG tablet Take 81 mg by mouth every other day. Takes along with Aggrenox      . betaxolol (BETOPTIC-S) 0.25 % ophthalmic suspension Place 1-2 drops into both eyes daily. 1 drop in both eyes in the am, 1 drop in the right eye in pm      . Calcium Carbonate (CALCIUM 600 PO) Take 600 mg by mouth 2 (two) times daily.      Marland Kitchen dexamethasone (DECADRON) 4 MG tablet Take 1 tablet (4 mg total) by mouth as directed. 2 tablets BID the day before, day of, and day after chemotherapy  40 tablet  1  . dipyridamole-aspirin (AGGRENOX) 200-25 MG per 12 hr capsule Take 1-2 capsules by mouth daily. Alternates between 1 and 2 tabs daily.      Marland Kitchen HYDROcodone-homatropine (HYCODAN) 5-1.5 MG/5ML syrup Take 5 mLs by mouth every 6 (six) hours as needed for cough.      . irbesartan (AVAPRO) 300 MG tablet Take 300 mg by mouth daily.       Marland Kitchen lidocaine (XYLOCAINE) 2 % solution Swish, gargle, then swallow 15 mLs by mouth as needed for pain up to 4 times a day.  100 mL  0  . lidocaine-prilocaine (EMLA) cream Apply 1 application topically as needed (for port-a-cath access).       Marland Kitchen loratadine (CLARITIN) 10 MG tablet Take 10 mg by mouth daily.      Marland Kitchen LORazepam (ATIVAN) 1 MG tablet Take 1 tablet (1 mg total) by mouth at bedtime as needed for anxiety.  30 tablet  3  . morphine (MS CONTIN) 15 MG 12 hr tablet Take 15 mg by mouth 2 (two) times daily as needed for pain.      . Multiple Vitamin (MULTIVITAMIN WITH MINERALS) TABS tablet Take 1 tablet by mouth at bedtime.      Marland Kitchen omeprazole (PRILOSEC) 20 MG capsule Take 20 mg by mouth daily. Per Dr. Virgel Manifold      . oxyCODONE (OXY IR/ROXICODONE) 5 MG immediate release tablet Take 1 tablet (5 mg total) by mouth every 4 (four) hours as needed for pain.  45 tablet  0  .  rosuvastatin (CRESTOR) 20 MG tablet Take 20 mg by mouth daily.      . sennosides-docusate sodium (SENOKOT-S) 8.6-50 MG tablet Take 1 tablet by mouth daily.      Marland Kitchen tiotropium (SPIRIVA) 18 MCG inhalation capsule Place 18 mcg into inhaler and inhale daily.        Marland Kitchen albuterol (PROVENTIL HFA;VENTOLIN HFA) 108 (90 BASE) MCG/ACT inhaler Inhale 2 puffs into the lungs every 4 (four) hours as needed for wheezing or  shortness of breath.       . dexamethasone (DECADRON) 0.5 MG tablet Take 1 tablet (0.5 mg total) by mouth daily with breakfast. Start Sept20th, take through Oct10th to prevent withdrawal symptoms from steroid  21 tablet  0  . fluconazole (DIFLUCAN) 150 MG tablet Take 1 tablet (150 mg total) by mouth once.  1 tablet  0  . prochlorperazine (COMPAZINE) 10 MG tablet Take 10 mg by mouth every 6 (six) hours as needed (for nausea).        No current facility-administered medications for this visit.    SURGICAL HISTORY:  Past Surgical History  Procedure Laterality Date  . Vasectomy    . Tonsillectomy    . Leg surgery      rt for nerve impingement  . Collarbone fracture      1977  . Tonsillectomy    . Eye surgery      catarct bil  . Portacath placement  02/08/2012    Procedure: INSERTION PORT-A-CATH;  Surgeon: [name], MD;  Location: [facility] OR;  Service: Thoracic;  Laterality: Right;  . Ct guided core biopsy  01/26/12    Left Lytic Lesion - Metastatic Squamous Cell Carcinoma  . Skin biopsy  02/09/12    Right Upper Back, shave : Melanoma In Situ Arising  From a Dysplastic Nevus    REVIEW OF SYSTEMS:  Constitutional: positive for fatigue Eyes: negative Ears, nose, mouth, throat, and face: positive for sore mouth Respiratory: positive for dyspnea on exertion Cardiovascular: negative Gastrointestinal: negative Genitourinary:negative Integument/breast: negative Hematologic/lymphatic: negative Musculoskeletal:negative Neurological: negative Behavioral/Psych: negative Endocrine:  negative Allergic/Immunologic: negative   PHYSICAL EXAMINATION: General appearance: alert, cooperative, fatigued and no distress Head: Normocephalic, without obvious abnormality, atraumatic Neck: no adenopathy, no JVD, supple, symmetrical, trachea midline and thyroid not enlarged, symmetric, no tenderness/mass/nodules Lymph nodes: Cervical, supraclavicular, and axillary nodes normal. Resp: clear to auscultation bilaterally Back: symmetric, no curvature. ROM normal. No CVA tenderness. Cardio: regular rate and rhythm, S1, S2 normal, no murmur, click, rub or gallop GI: soft, non-tender; bowel sounds normal; no masses,  no organomegaly Extremities: extremities normal, atraumatic, no cyanosis or edema Neurologic: Alert and oriented X 3, normal strength and tone. Normal symmetric reflexes. Normal coordination and gait  ECOG PERFORMANCE STATUS: 1 - Symptomatic but completely ambulatory  Blood pressure 143/66, pulse 114, temperature 97.7 F (36.5 C), temperature source Oral, resp. rate 20, height 5\' 7"  (1.702 m), weight 150 lb 1.6 oz (68.085 kg).  LABORATORY DATA: Lab Results  Component Value Date   WBC 8.3 12/05/2012   HGB 8.4* 12/05/2012   HCT 27.0* 12/05/2012   MCV 103.1* 12/05/2012   PLT 76* 12/05/2012      Chemistry      Component Value Date/Time   NA 137 12/04/2012 1139   NA 145 11/28/2012 0828   K 3.8 12/04/2012 1139   K 3.7 11/28/2012 0828   CL 99 12/04/2012 1139   CL 108* 08/29/2012 1100   CO2 27 12/04/2012 1139   CO2 26 11/28/2012 0828   BUN 15 12/04/2012 1139   BUN 16.9 11/28/2012 0828   CREATININE 0.91 12/04/2012 1139   CREATININE 0.9 11/28/2012 0828      Component Value Date/Time   CALCIUM 8.8 12/04/2012 1139   CALCIUM 8.6 11/28/2012 0828   ALKPHOS 83 12/04/2012 1139   ALKPHOS 61 11/28/2012 0828   AST 22 12/04/2012 1139   AST 23 11/28/2012 0828   ALT 23 12/04/2012 1139   ALT 29 11/28/2012 0828  BILITOT 0.4 12/04/2012 1139   BILITOT 0.70 11/28/2012 0828       RADIOGRAPHIC  STUDIES: Dg Chest 2 View  12/04/2012   CLINICAL DATA:  Initial encounter for current episode of fever and sore throat. Current history of metastatic right upper lobe lung cancer for which the patient is receiving chemotherapy and radiation therapy (last chemotherapy treatment 10/24/2012 and last brain radiation treatment 10/17/2012).  EXAM: CHEST  2 VIEW  COMPARISON:  CT chest 10/21/2012, 07/04/2012, 04/15/2012. One view chest x-ray 03/10/2012.  FINDINGS: Medial right upper lobe lung mass similar in appearance to the 10/21/2012 CT. The adjacent satellite nodule identified on CT is not visible on the PA view due to its position immediately anterior to the larger mass, but is vaguely visible on the lateral. No new pulmonary parenchymal nodules or masses. Patchy opacities in the right upper lobe adjacent to the large mass, unchanged, likely post-obstructive pneumonitis. No new pulmonary parenchymal abnormalities. No pleural effusions. Degenerative changes involving the thoracic spine. Healed or healing fractures involving the left 7th lateral rib and right posterolateral 7th and lateral 8th ribs and healed right clavicular fracture as noted on the CT. Cardiac silhouette normal in size, unchanged. Thoracic aorta mildly atherosclerotic, unchanged. Hilar and mediastinal contours otherwise unremarkable. Right jugular Port-A-Cath tip in the mid SVC.  IMPRESSION: 1. Mass involving the medial left upper lobe with adjacent probable post-obstructive pneumonitis, not significantly changed in appearance since the most recent CT 10/21/2012. 2. No new/acute cardiopulmonary disease.   Electronically Signed   By: Hulan Saas   On: 12/04/2012 12:58   Mr Brain W Wo Contrast  11/16/2012   *RADIOLOGY REPORT*  Clinical Data: History of non-small cell lung cancer with brain and bone metastatic disease.  Post stereotactic radiation therapy.  New gait instability.  Hypertensive hyperlipidemic smoker.  MRI HEAD WITHOUT AND WITH  CONTRAST  Technique:  Multiplanar, multiecho pulse sequences of the brain and surrounding structures were obtained according to standard protocol without and with intravenous contrast  Contrast: 14mL MULTIHANCE GADOBENATE DIMEGLUMINE 529 MG/ML IV SOLN  Comparison: Several prior MR is most recent 10/12/2012.  Findings: 1.5 Tesla versus prior 3 Tesla imaging.  No acute infarct.  Superior mid left frontal mass has decreased in size measuring 1.9 x 1.5 x 1.7 cm versus prior 2.6 x 2.1 x 2.4 cm.  Immediately inferior to this level, decreased size of satellite lesion now with maximal dimension of 3.7 mm versus prior 5.3 mm.  Decrease in surrounding T2 altered signal intensity.  Small amount of blood breakdown products within the dominant left frontal lobe lesion less prominent than on the prior exam.  No new parenchymal mass.  Prominent white matter type changes separate from the above described tumoral disease suggestive of result of small vessel disease/result of treatment of tumor.  Global atrophy without hydrocephalus.  Major intracranial vascular structures are patent.  IMPRESSION: Interval improvement of intracranial metastatic disease involving the left frontal lobe as detailed above without new metastatic lesion detected.   Original Report Authenticated By: Lacy Duverney, M.D.    ASSESSMENT AND PLAN: this is a very pleasant 79 years old white male with metastatic non-small cell lung cancer, squamous cell carcinoma status post several chemotherapy regimens most recently with single agent docetaxel but unfortunately the patient was unable to tolerate his treatment secondary to persistent thrombocytopenia and increasing fatigue. His Veristrat test was good. I recommended for the patient treatment with oral Tarceva 150 mg by mouth daily. I discussed with the patient adverse  effect of this treatment including but not limited to skin rash, diarrhea, interstitial lung disease, liver or renal dysfunction. The patient  is expected to start the first dose of the treatment in the next few days. For the oral thrush, I will start the patient on Diflucan 100 mg by mouth daily for 10 days. For the bone metastasis, the patient will continue his treatment with Xgeva as scheduled. The patient will continue on treatment with Augmentin as prescribed by Dr. Rosie Fate. He would come back for follow up visit in 2 weeks for evaluation and management any adverse effect of his treatment.  The patient voices understanding of current disease status and treatment options and is in agreement with the current care plan.  All questions were answered. The patient knows to call the clinic with any problems, questions or concerns. We can certainly see the patient much sooner if necessary.  I spent 20 minutes counseling the patient face to face. The total time spent in the appointment was 30 minutes.

## 2012-12-05 NOTE — Telephone Encounter (Signed)
gv and printed appt sched and avs for pt for OCT. °

## 2012-12-05 NOTE — Patient Instructions (Signed)
CURRENT THERAPY:  1) Tarceva 150 mg by mouth daily 2) Xgeva 120 mg subcutaneously every 4 weeks. Status post fiirst dose given on 08/02/2012.   CHEMOTHERAPY INTENT: Palliative  CURRENT # OF CHEMOTHERAPY CYCLES: 1  CURRENT ANTIEMETICS: Zofran, dexamethasone and Compazine  CURRENT SMOKING STATUS: Former smoker quit 01/15/2012  ORAL CHEMOTHERAPY AND CONSENT: Tarceva and consent was obtained today.  CURRENT BISPHOSPHONATES USE: Xgeva  PAIN MANAGEMENT: 0/10  NARCOTICS INDUCED CONSTIPATION: None  LIVING WILL AND CODE STATUS: Has living will. Initial full code but not prolonged resuscitation.

## 2012-12-05 NOTE — Telephone Encounter (Signed)
Called diflucan to pharmacy.

## 2012-12-06 ENCOUNTER — Ambulatory Visit: Payer: Medicare Other

## 2012-12-06 LAB — CULTURE, GROUP A STREP

## 2012-12-08 ENCOUNTER — Telehealth: Payer: Self-pay | Admitting: Medical Oncology

## 2012-12-08 NOTE — Telephone Encounter (Signed)
I instructed pt to take tarceva in am and prilosec in pm. Pt Understands. Elizabeth notified too.

## 2012-12-12 ENCOUNTER — Ambulatory Visit: Payer: Medicare Other

## 2012-12-14 ENCOUNTER — Telehealth: Payer: Self-pay | Admitting: *Deleted

## 2012-12-14 NOTE — Telephone Encounter (Signed)
Pt will start tarceva 12/15/12.  SLJ

## 2012-12-15 ENCOUNTER — Telehealth: Payer: Self-pay | Admitting: Cardiology

## 2012-12-15 MED ORDER — ROSUVASTATIN CALCIUM 10 MG PO TABS: 10.0000 mg | ORAL_TABLET | Freq: Every day | ORAL | Status: DC

## 2012-12-15 NOTE — Telephone Encounter (Signed)
New Problem   Pt's wife is questioning Tarceva per Pharmacy.. The pharmacy suggest that the Mg's are lowered. Cautious of potential symptoms// Please call back to discuss.

## 2012-12-15 NOTE — Telephone Encounter (Signed)
Returned call to patient he stated he just started taking Tarceva 150 mg daily today for his cancer.Stated pharmacist advised him to check with Dr.Jordan that Tarceva may interact with amlodipine and crestor.

## 2012-12-15 NOTE — Telephone Encounter (Signed)
Returned call to patient spoke to Dr.Jordan he advised to decrease crestor to 10 mg daily,continue amlodipine 2.5 mg daily.

## 2012-12-19 ENCOUNTER — Ambulatory Visit: Payer: Medicare Other | Admitting: Physician Assistant

## 2012-12-19 ENCOUNTER — Other Ambulatory Visit: Payer: Self-pay | Admitting: *Deleted

## 2012-12-19 ENCOUNTER — Other Ambulatory Visit: Payer: Medicare Other | Admitting: Lab

## 2012-12-19 ENCOUNTER — Encounter: Payer: Self-pay | Admitting: Radiation Therapy

## 2012-12-19 DIAGNOSIS — B370 Candidal stomatitis: Secondary | ICD-10-CM

## 2012-12-19 MED ORDER — MAGIC MOUTHWASH W/LIDOCAINE: 5.0000 mL | Freq: Four times a day (QID) | ORAL | Status: AC | PRN

## 2012-12-19 MED ORDER — FLUCONAZOLE 100 MG PO TABS
100.0000 mg | ORAL_TABLET | ORAL | Status: AC
Start: 2012-12-19 — End: ?

## 2012-12-19 NOTE — Telephone Encounter (Signed)
Pt called stating that he has thrush.  He never took the diflucan rx that was called in on 9/29.  Per AJ, okay to take 200mg  STAT, then 100mg  x 10 days.  Pt also requested refill for magic mouthwash.  SLJ

## 2012-12-19 NOTE — Progress Notes (Unsigned)
I was talking to Mr. Weissberg today about his December follow-up scan and visit with Dr. Newell Coral. He wanted me to pass along that he has been having some trouble over the past 2 weeks.  - Unable to absorb or comprehend what he is reading - Trouble with balance and experiencing a little dizziness - No headache, no nausea, no vision changes - Very bad pain in Rt. Hip that radiates down to his knee (7 out of a 10 on the pain scale)  - he has increased his pain medication to help with this, and states that it is also positional, he can find a certain position and the pain will go away. - He has not been able to participate in physical therapy for the last 3 days due to his pain.  - Has thrush, but has been given an RX for Diflucan which he just started taking   I let him know that I would communicate this to Dr. Basilio Cairo and her nurse Elnita Maxwell. He is expecting a call back and wondering if he can have further treatment to his Rt hip since it has been treated before.

## 2012-12-20 ENCOUNTER — Other Ambulatory Visit: Payer: Self-pay | Admitting: Radiation Therapy

## 2012-12-20 DIAGNOSIS — C7951 Secondary malignant neoplasm of bone: Secondary | ICD-10-CM

## 2012-12-20 DIAGNOSIS — C7931 Secondary malignant neoplasm of brain: Secondary | ICD-10-CM

## 2012-12-21 ENCOUNTER — Encounter: Payer: Self-pay | Admitting: Medical Oncology

## 2012-12-21 ENCOUNTER — Telehealth: Payer: Self-pay | Admitting: Medical Oncology

## 2012-12-21 NOTE — Telephone Encounter (Signed)
Letter put on Dr Arbutus Ped desk for review. Wife wants to pick up tomorrow at [facility] desk.

## 2012-12-21 NOTE — Telephone Encounter (Addendum)
Wife request letter for long term care policy to get his benefits  activated. The past 2 weeks he cannot dress himself, not steady on his feet . He is incontinent all the time where she has to wash his Pajamas daily. She has to do everything for him. I left message for wife to call me back.

## 2012-12-22 ENCOUNTER — Ambulatory Visit (HOSPITAL_COMMUNITY)
Admission: RE | Admit: 2012-12-22 | Discharge: 2012-12-22 | Disposition: A | Discharge status: Home / Self Care | Payer: Medicare Other | Source: Ambulatory Visit | Attending: Radiation Oncology | Admitting: Radiation Oncology

## 2012-12-22 ENCOUNTER — Encounter (HOSPITAL_COMMUNITY): Payer: Self-pay

## 2012-12-22 DIAGNOSIS — C7931 Secondary malignant neoplasm of brain: Secondary | ICD-10-CM

## 2012-12-22 DIAGNOSIS — R209 Unspecified disturbances of skin sensation: Secondary | ICD-10-CM | POA: Insufficient documentation

## 2012-12-22 DIAGNOSIS — Z5111 Encounter for antineoplastic chemotherapy: Secondary | ICD-10-CM | POA: Insufficient documentation

## 2012-12-22 DIAGNOSIS — Z8673 Personal history of transient ischemic attack (TIA), and cerebral infarction without residual deficits: Secondary | ICD-10-CM | POA: Insufficient documentation

## 2012-12-22 DIAGNOSIS — C3490 Malignant neoplasm of unspecified part of unspecified bronchus or lung: Secondary | ICD-10-CM | POA: Insufficient documentation

## 2012-12-22 DIAGNOSIS — R42 Dizziness and giddiness: Secondary | ICD-10-CM | POA: Insufficient documentation

## 2012-12-22 DIAGNOSIS — R29898 Other symptoms and signs involving the musculoskeletal system: Secondary | ICD-10-CM | POA: Insufficient documentation

## 2012-12-22 DIAGNOSIS — C7951 Secondary malignant neoplasm of bone: Secondary | ICD-10-CM | POA: Insufficient documentation

## 2012-12-22 DIAGNOSIS — R269 Unspecified abnormalities of gait and mobility: Secondary | ICD-10-CM | POA: Insufficient documentation

## 2012-12-22 MED ORDER — IOHEXOL 300 MG/ML  SOLN
100.0000 mL | Freq: Once | INTRAMUSCULAR | Status: AC | PRN
  Administered 2012-12-22: 100 mL via INTRAVENOUS

## 2012-12-23 ENCOUNTER — Telehealth: Payer: Self-pay | Admitting: Medical Oncology

## 2012-12-23 DIAGNOSIS — C7951 Secondary malignant neoplasm of bone: Secondary | ICD-10-CM

## 2012-12-23 MED ORDER — OXYCODONE HCL 5 MG PO TABS: 5.0000 mg | ORAL_TABLET | ORAL | Status: AC | PRN

## 2012-12-23 NOTE — Telephone Encounter (Signed)
Long term care letter left up front on 10/16 fro wife to pick up.

## 2012-12-23 NOTE — Telephone Encounter (Signed)
Increased pain -took MS contin 15 at 0730, oxycodone 5 at 083 -pain went from 8/10 to 7/10 right leg and knee . Losing strength in lower extremities and using walker now. Per Dr Wenda Low can taek 1-2 tablets oxycodone every 4 hours. I instructed his wife on this dose and left rx for family to pick up today.

## 2012-12-26 ENCOUNTER — Inpatient Hospital Stay (HOSPITAL_COMMUNITY): Payer: Medicare Other

## 2012-12-26 ENCOUNTER — Encounter: Payer: Self-pay | Admitting: Radiation Oncology

## 2012-12-26 ENCOUNTER — Telehealth: Payer: Self-pay | Admitting: *Deleted

## 2012-12-26 ENCOUNTER — Inpatient Hospital Stay (HOSPITAL_COMMUNITY)
Admission: EM | Admit: 2012-12-26 | Discharge: 2013-02-06 | DRG: 542 | Disposition: E | Payer: Medicare Other | Attending: Internal Medicine | Admitting: Internal Medicine

## 2012-12-26 ENCOUNTER — Emergency Department (HOSPITAL_COMMUNITY): Payer: Medicare Other

## 2012-12-26 ENCOUNTER — Encounter (HOSPITAL_COMMUNITY): Payer: Self-pay | Admitting: Emergency Medicine

## 2012-12-26 DIAGNOSIS — K5900 Constipation, unspecified: Secondary | ICD-10-CM | POA: Diagnosis present

## 2012-12-26 DIAGNOSIS — C782 Secondary malignant neoplasm of pleura: Secondary | ICD-10-CM | POA: Diagnosis present

## 2012-12-26 DIAGNOSIS — C7931 Secondary malignant neoplasm of brain: Secondary | ICD-10-CM

## 2012-12-26 DIAGNOSIS — D61810 Antineoplastic chemotherapy induced pancytopenia: Secondary | ICD-10-CM

## 2012-12-26 DIAGNOSIS — J9383 Other pneumothorax: Secondary | ICD-10-CM | POA: Diagnosis not present

## 2012-12-26 DIAGNOSIS — C801 Malignant (primary) neoplasm, unspecified: Secondary | ICD-10-CM

## 2012-12-26 DIAGNOSIS — G936 Cerebral edema: Secondary | ICD-10-CM | POA: Diagnosis present

## 2012-12-26 DIAGNOSIS — IMO0002 Reserved for concepts with insufficient information to code with codable children: Secondary | ICD-10-CM

## 2012-12-26 DIAGNOSIS — I359 Nonrheumatic aortic valve disorder, unspecified: Secondary | ICD-10-CM

## 2012-12-26 DIAGNOSIS — C7951 Secondary malignant neoplasm of bone: Principal | ICD-10-CM | POA: Diagnosis present

## 2012-12-26 DIAGNOSIS — C3490 Malignant neoplasm of unspecified part of unspecified bronchus or lung: Secondary | ICD-10-CM | POA: Diagnosis present

## 2012-12-26 DIAGNOSIS — J984 Other disorders of lung: Secondary | ICD-10-CM | POA: Diagnosis not present

## 2012-12-26 DIAGNOSIS — R29898 Other symptoms and signs involving the musculoskeletal system: Secondary | ICD-10-CM | POA: Diagnosis present

## 2012-12-26 DIAGNOSIS — M5416 Radiculopathy, lumbar region: Secondary | ICD-10-CM | POA: Diagnosis present

## 2012-12-26 DIAGNOSIS — Z4682 Encounter for fitting and adjustment of non-vascular catheter: Secondary | ICD-10-CM

## 2012-12-26 DIAGNOSIS — Z9181 History of falling: Secondary | ICD-10-CM

## 2012-12-26 DIAGNOSIS — J449 Chronic obstructive pulmonary disease, unspecified: Secondary | ICD-10-CM | POA: Diagnosis present

## 2012-12-26 DIAGNOSIS — K219 Gastro-esophageal reflux disease without esophagitis: Secondary | ICD-10-CM | POA: Diagnosis present

## 2012-12-26 DIAGNOSIS — D638 Anemia in other chronic diseases classified elsewhere: Secondary | ICD-10-CM | POA: Diagnosis present

## 2012-12-26 DIAGNOSIS — J90 Pleural effusion, not elsewhere classified: Secondary | ICD-10-CM | POA: Diagnosis present

## 2012-12-26 DIAGNOSIS — M8448XA Pathological fracture, other site, initial encounter for fracture: Secondary | ICD-10-CM | POA: Diagnosis present

## 2012-12-26 DIAGNOSIS — R0603 Acute respiratory distress: Secondary | ICD-10-CM

## 2012-12-26 DIAGNOSIS — J4489 Other specified chronic obstructive pulmonary disease: Secondary | ICD-10-CM

## 2012-12-26 DIAGNOSIS — J948 Other specified pleural conditions: Secondary | ICD-10-CM

## 2012-12-26 DIAGNOSIS — J860 Pyothorax with fistula: Secondary | ICD-10-CM | POA: Diagnosis not present

## 2012-12-26 DIAGNOSIS — J189 Pneumonia, unspecified organism: Secondary | ICD-10-CM | POA: Diagnosis not present

## 2012-12-26 DIAGNOSIS — I350 Nonrheumatic aortic (valve) stenosis: Secondary | ICD-10-CM

## 2012-12-26 DIAGNOSIS — Z789 Other specified health status: Secondary | ICD-10-CM

## 2012-12-26 DIAGNOSIS — Z87891 Personal history of nicotine dependence: Secondary | ICD-10-CM

## 2012-12-26 DIAGNOSIS — R531 Weakness: Secondary | ICD-10-CM

## 2012-12-26 DIAGNOSIS — Z515 Encounter for palliative care: Secondary | ICD-10-CM

## 2012-12-26 DIAGNOSIS — B370 Candidal stomatitis: Secondary | ICD-10-CM | POA: Diagnosis present

## 2012-12-26 DIAGNOSIS — I10 Essential (primary) hypertension: Secondary | ICD-10-CM | POA: Diagnosis present

## 2012-12-26 DIAGNOSIS — Z9221 Personal history of antineoplastic chemotherapy: Secondary | ICD-10-CM

## 2012-12-26 DIAGNOSIS — Z806 Family history of leukemia: Secondary | ICD-10-CM

## 2012-12-26 DIAGNOSIS — C787 Secondary malignant neoplasm of liver and intrahepatic bile duct: Secondary | ICD-10-CM | POA: Diagnosis present

## 2012-12-26 DIAGNOSIS — R350 Frequency of micturition: Secondary | ICD-10-CM | POA: Diagnosis not present

## 2012-12-26 DIAGNOSIS — J910 Malignant pleural effusion: Secondary | ICD-10-CM | POA: Diagnosis present

## 2012-12-26 DIAGNOSIS — I959 Hypotension, unspecified: Secondary | ICD-10-CM

## 2012-12-26 DIAGNOSIS — Z66 Do not resuscitate: Secondary | ICD-10-CM | POA: Diagnosis present

## 2012-12-26 HISTORY — DX: History of falling: Z91.81

## 2012-12-26 HISTORY — DX: Encounter for fitting and adjustment of non-vascular catheter: Z46.82

## 2012-12-26 HISTORY — DX: Pneumothorax, unspecified: J93.9

## 2012-12-26 HISTORY — DX: Weakness: R53.1

## 2012-12-26 HISTORY — DX: Other specified health status: Z78.9

## 2012-12-26 LAB — CBC WITH DIFFERENTIAL/PLATELET
Basophils Absolute: 0 10*3/uL (ref 0.0–0.1)
Basophils Relative: 0 % (ref 0–1)
Eosinophils Absolute: 0 10*3/uL (ref 0.0–0.7)
Hemoglobin: 8.7 g/dL — ABNORMAL LOW (ref 13.0–17.0)
Lymphs Abs: 0.8 10*3/uL (ref 0.7–4.0)
MCH: 32 pg (ref 26.0–34.0)
Monocytes Relative: 12 % (ref 3–12)
Neutro Abs: 7.3 10*3/uL (ref 1.7–7.7)
Neutrophils Relative %: 79 % — ABNORMAL HIGH (ref 43–77)
Platelets: 197 10*3/uL (ref 150–400)
RBC: 2.72 MIL/uL — ABNORMAL LOW (ref 4.22–5.81)
RDW: 18.1 % — ABNORMAL HIGH (ref 11.5–15.5)

## 2012-12-26 LAB — URINALYSIS, ROUTINE W REFLEX MICROSCOPIC
Bilirubin Urine: NEGATIVE
Glucose, UA: NEGATIVE mg/dL
Hgb urine dipstick: NEGATIVE
Ketones, ur: NEGATIVE mg/dL
Nitrite: NEGATIVE
Protein, ur: NEGATIVE mg/dL
Urobilinogen, UA: 0.2 mg/dL (ref 0.0–1.0)
pH: 6 (ref 5.0–8.0)

## 2012-12-26 LAB — COMPREHENSIVE METABOLIC PANEL
ALT: 26 U/L (ref 0–53)
Albumin: 2.4 g/dL — ABNORMAL LOW (ref 3.5–5.2)
Alkaline Phosphatase: 117 U/L (ref 39–117)
Chloride: 104 mEq/L (ref 96–112)
GFR calc Af Amer: >90 mL/min (ref >90)
Glucose, Bld: 112 mg/dL — ABNORMAL HIGH (ref 70–99)
Potassium: 4.2 mEq/L (ref 3.5–5.1)
Sodium: 135 mEq/L (ref 135–145)
Total Bilirubin: 0.6 mg/dL (ref 0.3–1.2)
Total Protein: 6 g/dL (ref 6.0–8.3)

## 2012-12-26 MED ORDER — DEXAMETHASONE SODIUM PHOSPHATE 10 MG/ML IJ SOLN
10.0000 mg | Freq: Once | INTRAMUSCULAR | Status: AC
  Administered 2012-12-26: 10 mg via INTRAVENOUS
  Filled 2012-12-26: qty 1

## 2012-12-26 MED ORDER — DEXAMETHASONE SODIUM PHOSPHATE 10 MG/ML IJ SOLN
10.0000 mg | Freq: Four times a day (QID) | INTRAMUSCULAR | Status: DC
  Administered 2012-12-26 – 2012-12-27 (×2): 10 mg via INTRAVENOUS
  Filled 2012-12-26 (×6): qty 1

## 2012-12-26 MED ORDER — LORAZEPAM 1 MG PO TABS
1.0000 mg | ORAL_TABLET | Freq: Every evening | ORAL | Status: DC | PRN
  Administered 2012-12-26 – 2013-01-08 (×13): 1 mg via ORAL
  Filled 2012-12-26 (×13): qty 1

## 2012-12-26 MED ORDER — PROCHLORPERAZINE MALEATE 10 MG PO TABS
10.0000 mg | ORAL_TABLET | Freq: Four times a day (QID) | ORAL | Status: DC | PRN
  Administered 2012-12-28 – 2013-01-07 (×6): 10 mg via ORAL
  Filled 2012-12-26 (×8): qty 1

## 2012-12-26 MED ORDER — AMLODIPINE BESYLATE 2.5 MG PO TABS
2.5000 mg | ORAL_TABLET | Freq: Every day | ORAL | Status: DC
  Administered 2012-12-26 – 2013-01-08 (×14): 2.5 mg via ORAL
  Filled 2012-12-26 (×15): qty 1

## 2012-12-26 MED ORDER — ONDANSETRON HCL 4 MG/2ML IJ SOLN: 4.0000 mg | Freq: Three times a day (TID) | INTRAMUSCULAR | Status: DC | PRN

## 2012-12-26 MED ORDER — ALBUTEROL SULFATE HFA 108 (90 BASE) MCG/ACT IN AERS
2.0000 | INHALATION_SPRAY | RESPIRATORY_TRACT | Status: DC | PRN
  Administered 2012-12-30: 2 via RESPIRATORY_TRACT
  Filled 2012-12-26: qty 6.7

## 2012-12-26 MED ORDER — OXYCODONE HCL 5 MG PO TABS
5.0000 mg | ORAL_TABLET | ORAL | Status: DC | PRN
  Administered 2012-12-27 – 2013-01-08 (×40): 5 mg via ORAL
  Filled 2012-12-26 (×41): qty 1

## 2012-12-26 MED ORDER — BETAXOLOL HCL 0.25 % OP SUSP
1.0000 [drp] | Freq: Every day | OPHTHALMIC | Status: DC
  Administered 2012-12-27 – 2013-01-09 (×14): 1 [drp] via OPHTHALMIC
  Filled 2012-12-26: qty 10

## 2012-12-26 MED ORDER — SENNOSIDES-DOCUSATE SODIUM 8.6-50 MG PO TABS
1.0000 | ORAL_TABLET | Freq: Every day | ORAL | Status: DC
  Administered 2012-12-27 – 2012-12-28 (×2): 1 via ORAL
  Filled 2012-12-26 (×2): qty 1

## 2012-12-26 MED ORDER — ADULT MULTIVITAMIN W/MINERALS CH
1.0000 | ORAL_TABLET | Freq: Every day | ORAL | Status: DC
  Administered 2012-12-26 – 2013-01-08 (×14): 1 via ORAL
  Filled 2012-12-26 (×15): qty 1

## 2012-12-26 MED ORDER — BETAXOLOL HCL 0.25 % OP SUSP
1.0000 [drp] | Freq: Every day | OPHTHALMIC | Status: DC
  Administered 2012-12-26 – 2013-01-08 (×14): 1 [drp] via OPHTHALMIC
  Filled 2012-12-26 (×2): qty 10

## 2012-12-26 MED ORDER — SODIUM CHLORIDE 0.9 % IV SOLN
INTRAVENOUS | Status: DC
  Administered 2012-12-26: 21:00:00 via INTRAVENOUS
  Administered 2012-12-27: 1000 mL via INTRAVENOUS
  Administered 2012-12-27 – 2012-12-29 (×3): via INTRAVENOUS

## 2012-12-26 MED ORDER — FLUCONAZOLE 100 MG PO TABS
100.0000 mg | ORAL_TABLET | Freq: Every day | ORAL | Status: AC
  Administered 2012-12-27 – 2012-12-29 (×3): 100 mg via ORAL
  Filled 2012-12-26 (×3): qty 1

## 2012-12-26 MED ORDER — ENOXAPARIN SODIUM 40 MG/0.4ML ~~LOC~~ SOLN
40.0000 mg | SUBCUTANEOUS | Status: DC
  Administered 2012-12-26 – 2012-12-31 (×6): 40 mg via SUBCUTANEOUS
  Filled 2012-12-26 (×7): qty 0.4

## 2012-12-26 MED ORDER — CALCIUM CARBONATE 600 MG PO TABS: 6000.0000 mg | ORAL_TABLET | Freq: Two times a day (BID) | ORAL | Status: DC

## 2012-12-26 MED ORDER — LORATADINE 10 MG PO TABS
10.0000 mg | ORAL_TABLET | Freq: Every day | ORAL | Status: DC
  Administered 2012-12-26 – 2013-01-08 (×14): 10 mg via ORAL
  Filled 2012-12-26 (×15): qty 1

## 2012-12-26 MED ORDER — MORPHINE SULFATE ER 15 MG PO TBCR
15.0000 mg | EXTENDED_RELEASE_TABLET | Freq: Two times a day (BID) | ORAL | Status: DC
  Administered 2012-12-26 – 2012-12-31 (×11): 15 mg via ORAL
  Filled 2012-12-26 (×11): qty 1

## 2012-12-26 MED ORDER — ATORVASTATIN CALCIUM 20 MG PO TABS
20.0000 mg | ORAL_TABLET | Freq: Every day | ORAL | Status: DC
  Administered 2012-12-27 – 2013-01-08 (×13): 20 mg via ORAL
  Filled 2012-12-26 (×14): qty 1

## 2012-12-26 MED ORDER — IRBESARTAN 300 MG PO TABS
300.0000 mg | ORAL_TABLET | Freq: Every day | ORAL | Status: DC
  Administered 2012-12-27 – 2013-01-08 (×13): 300 mg via ORAL
  Filled 2012-12-26 (×14): qty 1

## 2012-12-26 MED ORDER — PANTOPRAZOLE SODIUM 40 MG PO TBEC
40.0000 mg | DELAYED_RELEASE_TABLET | Freq: Two times a day (BID) | ORAL | Status: DC
  Administered 2012-12-26 – 2013-01-09 (×28): 40 mg via ORAL
  Filled 2012-12-26 (×32): qty 1

## 2012-12-26 MED ORDER — ASPIRIN-DIPYRIDAMOLE ER 25-200 MG PO CP12
1.0000 | ORAL_CAPSULE | Freq: Every day | ORAL | Status: DC
  Administered 2012-12-26 – 2013-01-04 (×10): 1 via ORAL
  Filled 2012-12-26 (×11): qty 1

## 2012-12-26 MED ORDER — TIOTROPIUM BROMIDE MONOHYDRATE 18 MCG IN CAPS
18.0000 ug | ORAL_CAPSULE | Freq: Every day | RESPIRATORY_TRACT | Status: DC
  Administered 2012-12-27 – 2013-01-08 (×13): 18 ug via RESPIRATORY_TRACT
  Filled 2012-12-26 (×3): qty 5

## 2012-12-26 MED ORDER — GADOBENATE DIMEGLUMINE 529 MG/ML IV SOLN
13.0000 mL | Freq: Once | INTRAVENOUS | Status: AC | PRN
  Administered 2012-12-26: 13 mL via INTRAVENOUS

## 2012-12-26 MED ORDER — ASPIRIN EC 81 MG PO TBEC
81.0000 mg | DELAYED_RELEASE_TABLET | ORAL | Status: DC
  Administered 2012-12-27 – 2013-01-08 (×7): 81 mg via ORAL
  Filled 2012-12-26 (×7): qty 1

## 2012-12-26 MED ORDER — SODIUM CHLORIDE 0.9 % IV SOLN: INTRAVENOUS | Status: DC

## 2012-12-26 MED ORDER — MAGIC MOUTHWASH W/LIDOCAINE
5.0000 mL | Freq: Four times a day (QID) | ORAL | Status: DC | PRN
  Administered 2012-12-28 – 2012-12-31 (×4): 5 mL via ORAL
  Filled 2012-12-26 (×6): qty 5

## 2012-12-26 MED ORDER — LEVALBUTEROL HCL 0.63 MG/3ML IN NEBU
0.6300 mg | INHALATION_SOLUTION | RESPIRATORY_TRACT | Status: DC | PRN
  Administered 2012-12-30 – 2013-01-09 (×2): 0.63 mg via RESPIRATORY_TRACT
  Filled 2012-12-26: qty 3

## 2012-12-26 MED ORDER — ASPIRIN-DIPYRIDAMOLE ER 25-200 MG PO CP12
1.0000 | ORAL_CAPSULE | ORAL | Status: DC
  Administered 2012-12-28 – 2013-01-05 (×5): 1 via ORAL
  Filled 2012-12-26 (×5): qty 1

## 2012-12-26 NOTE — ED Notes (Signed)
Pt had CT scan on Thursday of brain and pelvis - supposed to get results tomorrow

## 2012-12-26 NOTE — Progress Notes (Signed)
   CARE MANAGEMENT ED NOTE 12/12/2012  Patient:  Joel Gibson, Joel Gibson   Account Number:  [id number]  Date Initiated:  12/22/2012  Documentation initiated by:  Edd Arbour  Subjective/Objective Assessment:   79 yr old medicare male c/o hx of lung cancer, bone cancer to right side of pelvis, and brain cancer, right sided weakness to right leg since Friday     Subjective/Objective Assessment Detail:   Cm referral for home health services needs ordered     Action/Plan:   Cm spoke with pt, wife and daughter Pt's choice is [facility] Cm contacted [facility] Hire of [facility] to folow pt for d/c needs Wife given list of [place] health services   Action/Plan Detail:   Anticipated DC Date:  12/29/2012     Status Recommendation to Physician:   Result of Recommendation:    Other ED Services  Consult Working Plan    DC Planning Services  Other  Outpatient Services - Pt will follow up    Choice offered to / List presented to:            Status of service:  Completed, signed off  ED Comments:   ED Comments Detail:

## 2012-12-26 NOTE — Consult Note (Signed)
NEURO HOSPITALIST CONSULT NOTE    Reason for Consult: right leg weakness.   HPI:                                                                                                                                          Joel Gibson is an 79 y.o. 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 Patient has had palliative radiotherapy to right hip, Systemic chemotherapy with carboplatin for AUC, Gemcitabine 1000 mg/M2 but was discontinued due to progression of disease, stereotactic radiotherapy to multiple brain lesions, single agent docetaxel but unfortunately the patient was unable to tolerate his treatment secondary to persistent thrombocytopenia and currently on Tarceva and Xgeva for bone metastasis for palliative intent.  patietn was brought to [facility] due to progressive Right leg weakness that has worsened over the course of 1 month. Patients daughter states on Thursday night he had a fall (wife is not a good historian and denies this--but on daughters phone there is a text from wife stating he had a unwitnessed fall).  Upon waking on Friday he was unable to move his right leg.  He denies any sensory changes.  In addition the daughter noted right arm drift on Friday which is still present.    Past Medical History  Diagnosis Date  . Hyperlipidemia   . Hypertension   . Aortic stenosis     mild to moderate  . Gastritis   . COPD (chronic obstructive pulmonary disease)   . Bronchitis   . Hepatitis     possible  . Hx of cataract surgery     lt eye  . Tobacco abuse   . Shortness of breath     with activity  . Retinal vein occlusion 1990  . GERD (gastroesophageal reflux disease)   . Hemorrhoid   . Aortic stenosis   . Hypertension   . Dyslipidemia   . Glaucoma   . Right rib fracture      Pathological Fracture / 4 weeks ago  . Lung mass     Right Upper Lobe - Lobular Mass  . SOB  (shortness of breath)     Mild  . Cough   . Stroke 2002    Right slight difference in vision  . S/P radiation therapy 02/22/12 - 03/07/12    Pelvis/Sacrum/30 Gray/10 Fractions and Left Scapula/Rib/ 8 Gray/1 Fraction   . Status post chemotherapy      Carboplatin/ Abraxane  . On antineoplastic chemotherapy     Gemcitabine and Xgeva  . Lung cancer 02/02/2012  . Bone metastases     Widespread/ Lytic Lesion Left 7t Rib, Left Iliac Pelvis, ribs and scapula    Past Surgical History  Procedure  Laterality Date  . Vasectomy    . Tonsillectomy    . Leg surgery      rt for nerve impingement  . Collarbone fracture      1977  . Tonsillectomy    . Eye surgery      catarct bil  . Portacath placement  02/08/2012    Procedure: INSERTION PORT-A-CATH;  Surgeon: [name], MD;  Location: [facility] OR;  Service: Thoracic;  Laterality: Right;  . Ct guided core biopsy  01/26/12    Left Lytic Lesion - Metastatic Squamous Cell Carcinoma  . Skin biopsy  02/09/12    Right Upper Back, shave : Melanoma In Situ Arising  From a Dysplastic Nevus    Family History  Problem Relation Age of Onset  . Heart attack Father     x7  . Throat cancer Father     Deceased from Staph Infection  . Heart attack Father     7 Times  . Leukemia Mother     Social History:  reports that he quit smoking about a year ago. His smoking use included Cigarettes. He has a 130 pack-year smoking history. He has never used smokeless tobacco. He reports that he drinks alcohol. He reports that he does not use illicit drugs.  No Known Allergies  MEDICATIONS:                                                                                                                     No current facility-administered medications for this encounter.   Current Outpatient Prescriptions  Medication Sig Dispense Refill  . betaxolol (BETOPTIC-S) 0.25 % ophthalmic suspension Place 1-2 drops into both eyes daily. 1 drop in both eyes in the am, 1 drop  in the right eye in pm      . irbesartan (AVAPRO) 300 MG tablet Take 300 mg by mouth daily.       Marland Kitchen loratadine (CLARITIN) 10 MG tablet Take 10 mg by mouth daily.      Marland Kitchen LORazepam (ATIVAN) 1 MG tablet Take 1 tablet (1 mg total) by mouth at bedtime as needed for anxiety.  30 tablet  3  . morphine (MS CONTIN) 15 MG 12 hr tablet Take 15 mg by mouth 2 (two) times daily as needed for pain.      Marland Kitchen omeprazole (PRILOSEC) 20 MG capsule Take 20 mg by mouth daily. Per Dr. Virgel Manifold      . oxyCODONE (OXY IR/ROXICODONE) 5 MG immediate release tablet Take 1 tablet (5 mg total) by mouth every 4 (four) hours as needed for pain (May take 1-2 tablets every 4 hours as needed for pain.). May take 1-2 tablets every 4 hours as needed for  pain  60 tablet  0  . rosuvastatin (CRESTOR) 10 MG tablet Take 1 tablet (10 mg total) by mouth daily.  90 tablet  3  . sennosides-docusate sodium (SENOKOT-S) 8.6-50 MG tablet Take 1 tablet by mouth daily.      Marland Kitchen tiotropium (SPIRIVA) 18  MCG inhalation capsule Place 18 mcg into inhaler and inhale daily.        Marland Kitchen albuterol (PROVENTIL HFA;VENTOLIN HFA) 108 (90 BASE) MCG/ACT inhaler Inhale 2 puffs into the lungs every 4 (four) hours as needed for wheezing or shortness of breath.       . Alum & Mag Hydroxide-Simeth (MAGIC MOUTHWASH W/LIDOCAINE) SOLN Take 5 mLs by mouth 4 (four) times daily as needed (do no swallow.  swish a spit QID prn).  180 mL  0  . amLODipine (NORVASC) 2.5 MG tablet Take 1 tablet (2.5 mg total) by mouth daily.  90 tablet  3  . amoxicillin-clavulanate (AUGMENTIN) 875-125 MG per tablet Take 1 tablet by mouth 2 (two) times daily.  20 tablet  0  . aspirin EC 81 MG tablet Take 81 mg by mouth every other day. Takes along with Aggrenox      . Calcium Carbonate (CALCIUM 600 PO) Take 600 mg by mouth 2 (two) times daily.      Marland Kitchen dexamethasone (DECADRON) 0.5 MG tablet Take 1 tablet (0.5 mg total) by mouth daily with breakfast. Start Sept20th, take through Oct10th to prevent withdrawal  symptoms from steroid  21 tablet  0  . dexamethasone (DECADRON) 4 MG tablet Take 1 tablet (4 mg total) by mouth as directed. 2 tablets BID the day before, day of, and day after chemotherapy  40 tablet  1  . dipyridamole-aspirin (AGGRENOX) 200-25 MG per 12 hr capsule Take 1-2 capsules by mouth daily. Alternates between 1 and 2 tabs daily.      Marland Kitchen erlotinib (TARCEVA) 150 MG tablet Take 1 tablet (150 mg total) by mouth daily. Take on an empty stomach 1 hour before meals or 2 hours after.  30 tablet  2  . fluconazole (DIFLUCAN) 100 MG tablet Take 1 tablet (100 mg total) by mouth as directed. 200mg  STAT, then take 100mg  daily x 10 days.  12 tablet  0  . HYDROcodone-homatropine (HYCODAN) 5-1.5 MG/5ML syrup Take 5 mLs by mouth every 6 (six) hours as needed for cough.      . lidocaine (XYLOCAINE) 2 % solution Swish, gargle, then swallow 15 mLs by mouth as needed for pain up to 4 times a day.  100 mL  0  . lidocaine-prilocaine (EMLA) cream Apply 1 application topically as needed (for port-a-cath access).       . Multiple Vitamin (MULTIVITAMIN WITH MINERALS) TABS tablet Take 1 tablet by mouth at bedtime.      . prochlorperazine (COMPAZINE) 10 MG tablet Take 10 mg by mouth every 6 (six) hours as needed (for nausea).           ROS:                                                                                                                                       History obtained from the patient  General ROS: negative  for - chills, fatigue, fever, night sweats, weight gain or weight loss Psychological ROS: negative for - behavioral disorder, hallucinations, memory difficulties, mood swings or suicidal ideation Ophthalmic ROS: negative for - blurry vision, double vision, eye pain or loss of vision ENT ROS: negative for - epistaxis, nasal discharge, oral lesions, sore throat, tinnitus or vertigo Allergy and Immunology ROS: negative for - hives or itchy/watery eyes Hematological and Lymphatic ROS: negative  for - bleeding problems, bruising or swollen lymph nodes Endocrine ROS: negative for - galactorrhea, hair pattern changes, polydipsia/polyuria or temperature intolerance Respiratory ROS: negative for - cough, hemoptysis, shortness of breath or wheezing Cardiovascular ROS: negative for - chest pain, dyspnea on exertion, edema or irregular heartbeat Gastrointestinal ROS: negative for - abdominal pain, diarrhea, hematemesis, nausea/vomiting or stool incontinence Genito-Urinary ROS: negative for - dysuria, hematuria, incontinence or urinary frequency/urgency Musculoskeletal ROS: negative for - joint swelling or muscular weakness Neurological ROS: as noted in HPI Dermatological ROS: negative for rash and skin lesion changes   Blood pressure 118/54, pulse 109, temperature 98.6 F (37 C), temperature source Oral, resp. rate 19, SpO2 92.00%.   Neurologic Examination:                                                                                                      Mental Status: Alert, oriented, thought content appropriate.  Speech fluent without evidence of aphasia.  Able to follow 3 step commands without difficulty. Cranial Nerves: II: visual fields grossly normal, pupils equal, round, reactive to light and accommodation III,IV, VI: ptosis not present, extraocular muscles extra-ocular motions intact bilaterally V,VII: smile symmetric, facial light touch sensation normal bilaterally VIII: hearing normal bilaterally IX,X: gag reflex present XI: trapezius strength/neck flexion strength normal bilaterally XII: tongue strength normal  Motor: Right : Upper extremity    Left:     Upper extremity 5/5 deltoid       5/5 deltoid 5/5 tricep      5/5 tricep 5/5 biceps      5/5 biceps  5/5wrist flexion     5/5 wrist flexion 5/5 wrist extension     5/5 wrist extension 5/5 hand grip      5/5 hand grip  Lower extremity     Lower extremity 4/5 hip flexor      5/5 hip flexor 4/5 hip adductors     5/5  hip adductors 4/5 hip abductors     5/5 hip abductors 3/5 quadricep      5/5 quadriceps  2/5 hamstrings     5/5 hamstrings 0/5 plantar flexion       5/5 plantar flexion 0/5 plantar extension     5/5 plantar extension  He has 4/5 strength at hip flex and abd/add, 3/5 with knee extension, 2/5 knee flexion, 2/5 ankle abduction and no DF,PF or ankle inversion.   Tone and bulk:normal tone throughout; no atrophy noted Sensory: Pinprick and light touch intact throughout, bilaterally Deep Tendon Reflexes:  Right: Upper Extremity   Left: Upper extremity   biceps (C-5 to C-6) 2/4   biceps (C-5 to C-6) 2/4 tricep (  C7) 2/4    triceps (C7) 2/4 Brachioradialis (C6) 2/4  Brachioradialis (C6) 2/4  Lower Extremity Lower Extremity  quadriceps (L-2 to L-4) 1/4   quadriceps (L-2 to L-4) 2/4 Achilles (S1) 0/4   Achilles (S1) 0/4 Plantars: Right: upgoing   Left: downgoing Cerebellar: normal finger-to-nose,normal heel-to-shin test on the left   No results found for this basename: cbc, bmp, coags, chol, tri, ldl, hga1c    Results for orders placed during the hospital encounter of 12/07/2012 (from the past 48 hour(s))  URINALYSIS, ROUTINE W REFLEX MICROSCOPIC     Status: None   Collection Time    12/09/2012  1:42 PM      Result Value Range   Color, Urine YELLOW  YELLOW   APPearance CLEAR  CLEAR   Specific Gravity, Urine 1.013  1.005 - 1.030   pH 6.0  5.0 - 8.0   Glucose, UA NEGATIVE  NEGATIVE mg/dL   Hgb urine dipstick NEGATIVE  NEGATIVE   Bilirubin Urine NEGATIVE  NEGATIVE   Ketones, ur NEGATIVE  NEGATIVE mg/dL   Protein, ur NEGATIVE  NEGATIVE mg/dL   Urobilinogen, UA 0.2  0.0 - 1.0 mg/dL   Nitrite NEGATIVE  NEGATIVE   Leukocytes, UA NEGATIVE  NEGATIVE   Comment: MICROSCOPIC NOT DONE ON URINES WITH NEGATIVE PROTEIN, BLOOD, LEUKOCYTES, NITRITE, OR GLUCOSE <1000 mg/dL.  CBC WITH DIFFERENTIAL     Status: Abnormal   Collection Time    12/12/2012  1:45 PM      Result Value Range   WBC 9.2  4.0 -  10.5 K/uL   RBC 2.72 (*) 4.22 - 5.81 MIL/uL   Hemoglobin 8.7 (*) 13.0 - 17.0 g/dL   HCT 82.9 (*) 56.2 - 13.0 %   MCV 102.2 (*) 78.0 - 100.0 fL   MCH 32.0  26.0 - 34.0 pg   MCHC 31.3  30.0 - 36.0 g/dL   RDW 86.5 (*) 78.4 - 69.6 %   Platelets 197  150 - 400 K/uL   Neutrophils Relative % 79 (*) 43 - 77 %   Neutro Abs 7.3  1.7 - 7.7 K/uL   Lymphocytes Relative 9 (*) 12 - 46 %   Lymphs Abs 0.8  0.7 - 4.0 K/uL   Monocytes Relative 12  3 - 12 %   Monocytes Absolute 1.1 (*) 0.1 - 1.0 K/uL   Eosinophils Relative 0  0 - 5 %   Eosinophils Absolute 0.0  0.0 - 0.7 K/uL   Basophils Relative 0  0 - 1 %   Basophils Absolute 0.0  0.0 - 0.1 K/uL  COMPREHENSIVE METABOLIC PANEL     Status: Abnormal   Collection Time    12/25/2012  1:45 PM      Result Value Range   Sodium 135  135 - 145 mEq/L   Potassium 4.2  3.5 - 5.1 mEq/L   Chloride 104  96 - 112 mEq/L   CO2 22  19 - 32 mEq/L   Glucose, Bld 112 (*) 70 - 99 mg/dL   BUN 15  6 - 23 mg/dL   Creatinine, Ser 2.95  0.50 - 1.35 mg/dL   Calcium 28.4 (*) 8.4 - 10.5 mg/dL   Total Protein 6.0  6.0 - 8.3 g/dL   Albumin 2.4 (*) 3.5 - 5.2 g/dL   AST 46 (*) 0 - 37 U/L   ALT 26  0 - 53 U/L   Alkaline Phosphatase 117  39 - 117 U/L   Total Bilirubin 0.6  0.3 -  1.2 mg/dL   GFR calc non Af Amer 78 (*) >90 mL/min   GFR calc Af Amer >90  >90 mL/min   Comment: (NOTE)     The eGFR has been calculated using the CKD EPI equation.     This calculation has not been validated in all clinical situations.     eGFR's persistently <90 mL/min signify possible Chronic Kidney     Disease.    Ct Head Wo Contrast  12/23/2012   CLINICAL DATA:  Headache. Intracranial metastatic disease. Lung cancer.  EXAM: CT HEAD WITHOUT CONTRAST  TECHNIQUE: Contiguous axial images were obtained from the base of the skull through the vertex without intravenous contrast.  COMPARISON:  12/22/2012  FINDINGS: Centrally necrotic left frontal lobe mass noted with surrounding vasogenic edema, overall  not significantly changed in appearance of the past 4 days. No new hemorrhage observed. The brainstem, cerebellum, thalami, and basal ganglia appear unremarkable. No hydrocephalus or brain herniation. No intracranial hemorrhage or acute CVA.  Atherosclerotic calcification of the carotid siphons.  IMPRESSION: 1. Overall stable appearance of left for frontal lobe mass with surrounding vasogenic edema. No new hemorrhage or significant change.   Electronically Signed   By: Herbie Baltimore M.D.   On: 12/25/2012 14:41     Assessment/Plan: 79 YO male with known lung cancer with brain and lumbar spine metastasis.  For over one month he has had decreased strength on the right leg however on Friday AM he noted significant weakness (inability to lift his right leg).  There is question of fall.  On exam he shows decreased weakness on the left patella and significant distal right leg weakness. Given sudden onset and right LE peripheral nerve finding would look further into lumbar spine etiology, with radiculopathy secondary to extension of metastasis. Will  Recommend: 1) MRI Lumbar spine w/wo contrast 2) Decadron 10 mg IV loading dose followed by 6 mg every 6 hours 3) Patient will likely need radiation oncology lumbar nerve root compression is demonstrated secondary to tumor metastasis   Felicie Morn PA-C [facility] [phone]  I personally participated this patient's evaluation and management, including formulating above clinical impression and management recommendations.  Venetia Maxon M.D. [facility] [phone]   01/02/2013, 3:43 PM

## 2012-12-26 NOTE — ED Notes (Signed)
Bed: WA02 Expected date:  Expected time:  Means of arrival:  Comments: ems- elderly, bone cancer, weakness

## 2012-12-26 NOTE — ED Notes (Signed)
Per ems: pt from home, hx of lung cancer, bone cancer to right side of pelvis, and brain cancer. Reports right sided weakness to right leg since Friday. Pt can stand with assistance, but unable to ambulate w/ walker d/t weakness on right side. Slight weakness noted to right arm. No altered mental status, denies headache, stroke screen negative. bp 158/72, pulse 103, saO2 98%, cbg 112

## 2012-12-26 NOTE — ED Notes (Signed)
Pt reports taking 1 morphine tab this morning around 0930

## 2012-12-26 NOTE — Progress Notes (Addendum)
[facility] History and Physical  Joel Gibson ZOX:[id number] DOB: Jun 12, 1931 DOA: 12/12/2012  Referring physician: Dr Hyacinth Meeker PCP: [name], MD  Specialists:   Chief Complaint: R. Leg weakness and pain  HPI: Joel Gibson is a 79 y.o. male with multiple medical problems as listed below including h/o Lung ca diagnosed in 2013 (and followed by Dr Arbutus Ped) metastatic to brain and and bones and s/p stereotactic radiotherapy (followed by Dr Basilio Cairo), CVA, HTN, AS who presents with the above complaints. He states that he developed pain from his hip area down to knee and R. Leg weakness 3-4days ago and it has worsened to where he is unable to move that leg. Per daughter pt had unwitnessed fall(although wife denies) prior to the worsening of these symptoms and that he has had some weakness for the past 1mos. Pt denies visual changes, dysphagia, slurred speech and no parasthesias reported.He was seen in the ED and head CT showed- stable appearance of left for frontal lobe mass with surrounding vasogenic edema and no new hemorrhage. Neuro was consulted and iv decadron recommended per Dr Roseanne Reno and admission to [facility] for further eval and management.   Review of Systems: The patient denies anorexia, fever, weight loss, vision loss, decreased hearing, hoarseness, chest pain, syncope, dyspnea on exertion, peripheral edema, hemoptysis, abdominal pain, melena, hematochezia, severe indigestion/heartburn, hematuria, incontinence, transient blindness, depression, unusual weight change, abnormal bleeding.   Past Medical History  Diagnosis Date  . Hyperlipidemia   . Hypertension   . Aortic stenosis     mild to moderate  . Gastritis   . COPD (chronic obstructive pulmonary disease)   . Bronchitis   . Hepatitis     possible  . Hx of cataract surgery     lt eye  . Tobacco abuse   . Shortness of breath     with activity  . Retinal vein occlusion 1990  . GERD (gastroesophageal reflux disease)    . Hemorrhoid   . Aortic stenosis   . Hypertension   . Dyslipidemia   . Glaucoma   . Right rib fracture      Pathological Fracture / 4 weeks ago  . Lung mass     Right Upper Lobe - Lobular Mass  . SOB (shortness of breath)     Mild  . Cough   . Stroke 2002    Right slight difference in vision  . S/P radiation therapy 02/22/12 - 03/07/12    Pelvis/Sacrum/30 Gray/10 Fractions and Left Scapula/Rib/ 8 Gray/1 Fraction   . Status post chemotherapy      Carboplatin/ Abraxane  . On antineoplastic chemotherapy     Gemcitabine and Xgeva  . Lung cancer 02/02/2012  . Bone metastases     Widespread/ Lytic Lesion Left 7t Rib, Left Iliac Pelvis, ribs and scapula   Past Surgical History  Procedure Laterality Date  . Vasectomy    . Tonsillectomy    . Leg surgery      rt for nerve impingement  . Collarbone fracture      1977  . Tonsillectomy    . Eye surgery      catarct bil  . Portacath placement  02/08/2012    Procedure: INSERTION PORT-A-CATH;  Surgeon: [name], MD;  Location: [facility] OR;  Service: Thoracic;  Laterality: Right;  . Ct guided core biopsy  01/26/12    Left Lytic Lesion - Metastatic Squamous Cell Carcinoma  . 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 His smoking use included Cigarettes. He has a 130 pack-year smoking history. He has never used smokeless tobacco. He reports that he drinks alcohol. He reports that he does not use illicit drugs.  where does patient live--home   No Known Allergies  Family History  Problem Relation Age of Onset  . Heart attack Father     x7  . Throat cancer Father     Deceased from Staph Infection  . Heart attack Father     7 Times  . Leukemia Mother     Prior to Admission medications   Medication Sig Start Date End Date Taking? Authorizing Provider  albuterol (PROVENTIL HFA;VENTOLIN HFA) 108 (90 BASE)  MCG/ACT inhaler Inhale 2 puffs into the lungs every 4 (four) hours as needed for wheezing or shortness of breath.    Yes Historical Provider, MD  Alum & Mag Hydroxide-Simeth (MAGIC MOUTHWASH W/LIDOCAINE) SOLN Take 5 mLs by mouth 4 (four) times daily as needed (do no swallow.  swish a spit QID prn). 12/19/12  Yes Adrena E Johnson, PA-C  amLODipine (NORVASC) 2.5 MG tablet Take 1 tablet (2.5 mg total) by mouth daily. 10/13/12  Yes Peter M [place], MD  aspirin EC 81 MG tablet Take 81 mg by mouth every other day. Takes along with Aggrenox   Yes Historical Provider, MD  betaxolol (BETOPTIC-S) 0.25 % ophthalmic suspension Place 1-2 drops into both eyes daily. 1 drop in both eyes in the am, 1 drop in the right eye in pm   Yes Historical Provider, MD  Calcium Carbonate (CALCIUM 600 PO) Take 600 mg by mouth 2 (two) times daily.   Yes Historical Provider, MD  denosumab (XGEVA) 120 MG/1.7ML SOLN injection Inject 120 mg into the skin once.   Yes Historical Provider, MD  dexamethasone (DECADRON) 0.5 MG tablet Take 1 tablet (0.5 mg total) by mouth daily with breakfast. Start Sept20th, take through Oct10th to prevent withdrawal symptoms from steroid 11/16/12  Yes Lonie Peak, MD  dipyridamole-aspirin (AGGRENOX) 200-25 MG per 12 hr capsule Take 1-2 capsules by mouth daily. Alternates between 1 and 2 tabs daily.   Yes Historical Provider, MD  erlotinib (TARCEVA) 150 MG tablet Take 1 tablet (150 mg total) by mouth daily. Take on an empty stomach 1 hour before meals or 2 hours after. 12/05/12  Yes Si Gaul, MD  fluconazole (DIFLUCAN) 100 MG tablet Take 1 tablet (100 mg total) by mouth as directed. 200mg  STAT, then take 100mg  daily x 10 days. 12/19/12  Yes Adrena E Johnson, PA-C  irbesartan (AVAPRO) 300 MG tablet Take 300 mg by mouth daily.    Yes Historical Provider, MD  lidocaine-prilocaine (EMLA) cream Apply 1 application topically as needed (for port-a-cath access).  02/02/12  Yes Si Gaul, MD  loratadine  (CLARITIN) 10 MG tablet Take 10 mg by mouth daily.   Yes Historical Provider, MD  LORazepam (ATIVAN) 1 MG tablet Take 1 tablet (1 mg total) by mouth at bedtime as needed for anxiety. 11/01/12  Yes Dorothea Ogle, MD  morphine (MS CONTIN) 15 MG 12 hr tablet Take 15 mg by mouth 2 (two) times daily as needed for pain.   Yes Historical Provider, MD  Multiple Vitamin (MULTIVITAMIN WITH MINERALS) TABS tablet Take 1 tablet by mouth at bedtime.   Yes Historical Provider, MD  omeprazole (PRILOSEC) 20 MG capsule Take 20 mg by mouth  daily. Per Dr. Virgel Manifold   Yes Historical Provider, MD  oxyCODONE (OXY IR/ROXICODONE) 5 MG immediate release tablet Take 1 tablet (5 mg total) by mouth every 4 (four) hours as needed for pain (May take 1-2 tablets every 4 hours as needed for pain.). May take 1-2 tablets every 4 hours as needed for  pain 12/23/12  Yes Si Gaul, MD  Probiotic Product (PROBIOTIC DAILY PO) Take 1 tablet by mouth daily.   Yes Historical Provider, MD  prochlorperazine (COMPAZINE) 10 MG tablet Take 10 mg by mouth every 6 (six) hours as needed (for nausea).  02/02/12  Yes Si Gaul, MD  rosuvastatin (CRESTOR) 10 MG tablet Take 10 mg by mouth daily. 12/15/12  Yes Peter M [place], MD  sennosides-docusate sodium (SENOKOT-S) 8.6-50 MG tablet Take 1 tablet by mouth daily.   Yes Historical Provider, MD  tiotropium (SPIRIVA) 18 MCG inhalation capsule Place 18 mcg into inhaler and inhale daily.     Yes Historical Provider, MD   Physical Exam: Filed Vitals:   12/25/2012 1503  BP: 118/54  Pulse: 109  Temp: 98.6 F (37 C)  Resp: 19    Constitutional: Vital signs reviewed.  Patient is a well-developed and well-nourished  in no acute distress and cooperative with exam. Alert and oriented x3.  Head: Normocephalic and atraumatic Mouth: no erythema or exudates, MMM Eyes: PERRL, EOMI, conjunctivae normal, No scleral icterus.  Neck: Supple, Trachea midline normal ROM, No JVD, mass, thyromegaly, or carotid bruit  present.  Cardiovascular: RRR,+2 systolic murmur present, S1 normal, S2 normal, no MRG, pulses symmetric and intact bilaterally Pulmonary/Chest: moderate air mov't, CTAB, no wheezes, rales, or rhonchi Abdominal: Soft. Non-tender, non-distended, bowel sounds are normal, no masses, organomegaly, or guarding present.  GU: no CVA tenderness Extremities: No cyanosis and no edema Neurological: A&O x3, RLEStrength- foot 0/5 and rest of leg3-4/5, RUE and L. Side 5/5  bilaterally, cranial nerve II-XII are grossly intact, sensory intact to light touch bilaterally.  Skin: Warm, dry and intact. No rash, cyanosis, or clubbing.  Psychiatric: Normal mood and affect. speech and behavior is normal.     Labs on Admission:  Basic Metabolic Panel:  Recent Labs Lab 12/10/2012 1345  NA 135  K 4.2  CL 104  CO2 22  GLUCOSE 112*  BUN 15  CREATININE 0.93  CALCIUM 11.3*   Liver Function Tests:  Recent Labs Lab 12/07/2012 1345  AST 46*  ALT 26  ALKPHOS 117  BILITOT 0.6  PROT 6.0  ALBUMIN 2.4*   No results found for this basename: LIPASE, AMYLASE,  in the last 168 hours No results found for this basename: AMMONIA,  in the last 168 hours CBC:  Recent Labs Lab 12/19/2012 1345  WBC 9.2  NEUTROABS 7.3  HGB 8.7*  HCT 27.8*  MCV 102.2*  PLT 197   Cardiac Enzymes: No results found for this basename: CKTOTAL, CKMB, CKMBINDEX, TROPONINI,  in the last 168 hours  BNP (last 3 results) No results found for this basename: PROBNP,  in the last 8760 hours CBG: No results found for this basename: GLUCAP,  in the last 168 hours  Radiological Exams on Admission: Ct Head Wo Contrast  12/30/2012   CLINICAL DATA:  Headache. Intracranial metastatic disease. Lung cancer.  EXAM: CT HEAD WITHOUT CONTRAST  TECHNIQUE: Contiguous axial images were obtained from the base of the skull through the vertex without intravenous contrast.  COMPARISON:  12/22/2012  FINDINGS: Centrally necrotic left frontal lobe mass noted  with surrounding vasogenic edema,  overall not significantly changed in appearance of the past 4 days. No new hemorrhage observed. The brainstem, cerebellum, thalami, and basal ganglia appear unremarkable. No hydrocephalus or brain herniation. No intracranial hemorrhage or acute CVA.  Atherosclerotic calcification of the carotid siphons.  IMPRESSION: 1. Overall stable appearance of left for frontal lobe mass with surrounding vasogenic edema. No new hemorrhage or significant change.   Electronically Signed   By: Herbie Baltimore M.D.   On: December 29, 2012 14:41      Assessment/Plan Present on Admission:  . Right leg weakness -As discussed above, upper vs lower motor neuron -CT head with vasogenic edema, will continue with IV decadron q6 -appreciate neuro assistance, will obtain MRI of lumbar as well as of brain, follow and further manage accordingly -I have consulted rad onc, discussed pt wth Dr Michell Heinrich and they will review the pending MRI and follow up and see pt in am . Hypercalcemia -secondary to malignancy/bone mets -IVF, steroids for now -follow recheck in am and further manage accordingly . Bone metastases/Lung cancer -will add Dr Arbutus Ped to treatment team on epic, follow and call consult as appropriate -rad onc consulted as above . Hypertension -continue outpt meds . GERD (gastroesophageal reflux disease) -place on PPI . COPD (chronic obstructive pulmonary disease) -Stable, continue outpt meds, add prn nebs . Anemia of chronic disease -stable, monitor and further manage as appropriate      Code Status: full Family Communication: daughter  Disposition Plan: admit to tele  Time spent: >65mins  Kela Millin [facility] Pager [phone]  If 7PM-7AM, please contact night-coverage [email or web address] Password [email or web address] 12-29-2012, 4:42 PM

## 2012-12-26 NOTE — Telephone Encounter (Signed)
Pt's wife called stating that pt fell over the weekend.  His right side is slumped down and he is unable to get out of bed.  She is requesting an appt today.  Per AJ, pt needs to report to the ED,  Pt's wife verbalized understanding.  SLJ

## 2012-12-26 NOTE — ED Notes (Signed)
Family member reports pt began taking tarveca approximately 2 weeks ago - wondering if new medication contributing to weakness

## 2012-12-26 NOTE — ED Provider Notes (Signed)
CSN: [id number]     Arrival date & time 2013-01-21  1159 History   First MD Initiated Contact with Patient 2013/01/21 1254     Chief Complaint  Patient presents with  . right-sided weakness    (Consider location/radiation/quality/duration/timing/severity/associated sxs/prior Treatment) HPI Comments: 79 year old male, history of metastatic primary lung cancer, metastatic to the brain, the bones, and the pelvis. He presents with a complaint of right leg weakness. Started several days ago, has been persistent, seems to be gradually worsening and is associated with difficulty ambulating, the family states that he has been dragging his right leg. There is no fevers, no coughing, no shortness of breath.  Nothing seems to make this better or worse, the patient did have CT scans of his head as well as his abdomen and pelvis are performed several days ago. He is currently taking chemotherapy  The history is provided by the patient.    Past Medical History  Diagnosis Date  . Hyperlipidemia   . Hypertension   . Aortic stenosis     mild to moderate  . Gastritis   . COPD (chronic obstructive pulmonary disease)   . Bronchitis   . Hepatitis     possible  . Hx of cataract surgery     lt eye  . Tobacco abuse   . Shortness of breath     with activity  . Retinal vein occlusion 1990  . GERD (gastroesophageal reflux disease)   . Hemorrhoid   . Aortic stenosis   . Hypertension   . Dyslipidemia   . Glaucoma   . Right rib fracture      Pathological Fracture / 4 weeks ago  . Lung mass     Right Upper Lobe - Lobular Mass  . SOB (shortness of breath)     Mild  . Cough   . Stroke 2002    Right slight difference in vision  . S/P radiation therapy 02/22/12 - 03/07/12    Pelvis/Sacrum/30 Gray/10 Fractions and Left Scapula/Rib/ 8 Gray/1 Fraction   . Status post chemotherapy      Carboplatin/ Abraxane  . Status post chemotherapy Stopped 10/03/12    Gemcitabine  - Progressed  . Lung cancer 02/02/2012   . Bone metastases     Widespread/ Lytic Lesion Left 7t Rib, Left Iliac Pelvis, ribs and scapula  . S/P radiation therapy 10/21/12    SRS Brain / Left Frontal / 20 Gy  . Status post chemotherapy     Docetaxel with Neulasta Support - Stopped due to Intolerance and Persistent Thrombocytopenia   . On antineoplastic chemotherapy     Tarceva daily / Xgeva Q 4 weeks  . Right sided weakness 2013-01-21  . Hx of fall 21-Jan-2013  . Health care home, active care coordination 01-21-2013  . Pneumothorax     Right Hydro-Pneumothorax  . Encounter for chest tube placement 12/30/13   Past Surgical History  Procedure Laterality Date  . Vasectomy    . Tonsillectomy    . Leg surgery      rt for nerve impingement  . Collarbone fracture      1977  . Tonsillectomy    . Eye surgery      catarct bil  . Portacath placement  02/08/2012    Procedure: INSERTION PORT-A-CATH;  Surgeon: [name], MD;  Location: [facility] OR;  Service: Thoracic;  Laterality: Right;  . Ct guided core biopsy  01/26/12    Left Lytic Lesion - Metastatic Squamous Cell Carcinoma  . Skin  biopsy  02/09/12    Right Upper Back, shave : Melanoma In Situ Arising  From a Dysplastic Nevus   Family History  Problem Relation Age of Onset  . Heart attack Father     x7  . Throat cancer Father     Deceased from Staph Infection  . Heart attack Father     7 Times  . Leukemia Mother    History  Substance Use Topics  . Smoking status: Former Smoker -- 2.00 packs/day for 65 years    Types: Cigarettes    Quit date: 01/15/2012  . Smokeless tobacco: Never Used  . Alcohol Use: Yes     Comment: occassional    Review of Systems  All other systems reviewed and are negative.    Allergies  Review of patient's allergies indicates no known allergies.  Home Medications   No current outpatient prescriptions on file. BP 142/57  Pulse 79  Temp(Src) 97.4 F (36.3 C) (Oral)  Resp 20  Ht 5\' 7"  (1.702 m)  Wt 145 lb 8.1 oz (66 kg)  BMI 22.78  kg/m2  SpO2 96% Physical Exam  Nursing note and vitals reviewed. Constitutional: He appears well-developed and well-nourished. No distress.  HENT:  Head: Normocephalic and atraumatic.  Mouth/Throat: Oropharynx is clear and moist. No oropharyngeal exudate.  Oropharynx is clear, no signs of thrush  Eyes: Conjunctivae and EOM are normal. Pupils are equal, round, and reactive to light. Right eye exhibits no discharge. Left eye exhibits no discharge. No scleral icterus.  Neck: Normal range of motion. Neck supple. No JVD present. No thyromegaly present.  Cardiovascular: Regular rhythm, normal heart sounds and intact distal pulses.  Exam reveals no gallop and no friction rub.   No murmur heard. Tachycardic to 110 beats per minute  Pulmonary/Chest: Effort normal and breath sounds normal. No respiratory distress. He has no wheezes. He has no rales.  Abdominal: Soft. Bowel sounds are normal. He exhibits no distension and no mass. There is no tenderness.  Musculoskeletal: Normal range of motion. He exhibits no edema and no tenderness.  Lymphadenopathy:    He has no cervical adenopathy.  Neurological: He is alert. Coordination normal.  Weakness at the right foot, right knee and right hip. The patient is able to move his leg in all directions but does have weakness and is unable to straight leg raise. Left lower extremity as well as the bilateral upper extremities with normal strength and sensation.  Skin: Skin is warm and dry. No rash noted. No erythema.  Psychiatric: He has a normal mood and affect. His behavior is normal.    ED Course  Procedures (including critical care time) Labs Review Labs Reviewed  CBC WITH DIFFERENTIAL - Abnormal; Notable for the following:    RBC 2.72 (*)    Hemoglobin 8.7 (*)    HCT 27.8 (*)    MCV 102.2 (*)    RDW 18.1 (*)    Neutrophils Relative % 79 (*)    Lymphocytes Relative 9 (*)    Monocytes Absolute 1.1 (*)    All other components within normal limits   COMPREHENSIVE METABOLIC PANEL - Abnormal; Notable for the following:    Glucose, Bld 112 (*)    Calcium 11.3 (*)    Albumin 2.4 (*)    AST 46 (*)    GFR calc non Af Amer 78 (*)    All other components within normal limits  HEMOGLOBIN A1C - Abnormal; Notable for the following:    Hemoglobin A1C  5.7 (*)    Mean Plasma Glucose 117 (*)    All other components within normal limits  CBC - Abnormal; Notable for the following:    RBC 2.78 (*)    Hemoglobin 9.1 (*)    HCT 28.2 (*)    MCV 101.4 (*)    RDW 17.6 (*)    All other components within normal limits  BASIC METABOLIC PANEL - Abnormal; Notable for the following:    Glucose, Bld 155 (*)    Calcium 11.0 (*)    GFR calc non Af Amer 80 (*)    All other components within normal limits  BASIC METABOLIC PANEL - Abnormal; Notable for the following:    Glucose, Bld 133 (*)    GFR calc non Af Amer 80 (*)    All other components within normal limits  CBC - Abnormal; Notable for the following:    WBC 11.4 (*)    RBC 2.77 (*)    Hemoglobin 8.9 (*)    HCT 27.6 (*)    RDW 17.9 (*)    All other components within normal limits  COMPREHENSIVE METABOLIC PANEL - Abnormal; Notable for the following:    Glucose, Bld 115 (*)    Total Protein 5.5 (*)    Albumin 2.3 (*)    AST 48 (*)    Alkaline Phosphatase 125 (*)    GFR calc non Af Amer 83 (*)    All other components within normal limits  MRSA PCR SCREENING  URINALYSIS, ROUTINE W REFLEX MICROSCOPIC  LIPID PANEL  PROCALCITONIN   Imaging Review Dg Chest 2 View  12/29/2012   CLINICAL DATA:  Rapid respiration  EXAM: CHEST  2 VIEW  COMPARISON:  12/17/2012  FINDINGS: Cardiomediastinal silhouette is stable. Again noted right upper lobe medial paratracheal mass. There is streaky airspace is in right upper lobe just lateral to the mass. Postobstructive pneumonia cannot be excluded. Clinical correlation is necessary. Stable right Port-A-Cath position. Osteopenia and degenerative changes thoracic  spine.  IMPRESSION: Again noted right upper lobe medial paratracheal mass. There is streaky airspace is in right upper lobe just lateral to the mass. Postobstructive pneumonia cannot be excluded. Clinical correlation is necessary.   Electronically Signed   By: Natasha Mead M.D.   On: 12/29/2012 15:37   Ct Angio Chest Pe W/cm &/or Wo Cm  12/30/2012   CLINICAL DATA:  Shortness of breath. Left axillary mass noted over the prior 2 weeks. History of lung cancer with ongoing chemotherapy and radiation therapy. Evaluate for pulmonary embolism.  EXAM: CT ANGIOGRAPHY CHEST WITH CONTRAST  TECHNIQUE: Multidetector CT imaging of the chest was performed using the standard protocol during bolus administration of intravenous contrast. Multiplanar CT image reconstructions including MIPs were obtained to evaluate the vascular anatomy.  CONTRAST:  80mL OMNIPAQUE IOHEXOL 350 MG/ML SOLN  COMPARISON:  Chest CT 10/21/2012.  FINDINGS: Mediastinum: Study is limited for evaluation of pulmonary embolism by a large amount of respiratory motion. With these limitations in mind, there is no evidence of central, lobar or proximal segmental sized pulmonary embolism. Distal segmental and subsegmental sized emboli cannot be entirely excluded. Heart size is normal. There is no significant pericardial fluid, thickening or pericardial calcification. There is atherosclerosis of the thoracic aorta, the great vessels of the mediastinum and the coronary arteries, including calcified atherosclerotic plaque in the left main, left anterior descending, left circumflex and right coronary arteries. Marked right hilar and mediastinal lymphadenopathy. Right hilar lymph nodes appear matted together in a conglomerate  mass like fashion and are therefore difficult to measure. There is a discrete right hilar lymph node on image 44 of series 4 which measures 17 mm in short axis. Subcarinal lymph nodes are enlarged measuring up to 14 mm in short axis. Low right  paratracheal lymphadenopathy 24 mm in short axis. Esophagus is unremarkable in appearance. Right internal jugular single-lumen porta cath with tip terminating in the distal superior vena cava.  Lungs/Pleura: Large right-sided hydropneumothorax with large pneumothorax component and moderate pleural effusion. Several areas of nodular pleural thickening and enhancement are identified in the right hemithorax, indicative of a malignant right pleural effusion with pleural involvement. Previously noted right upper lobe mass has significantly increased in size, currently measuring up to 7.5 x 5.2 cm (image 29 of series 4). Passive atelectasis throughout much of the right lung, particularly in the basal segments of the right lower lobe. Postobstructive changes (likely pneumonitis) in the right upper lobe surrounding the right upper lobe mass. There is a background of moderate centrilobular and paraseptal emphysema.  Upper Abdomen: There are now innumerable hypovascular lesions scattered throughout the hepatic parenchyma, compatible with widespread hepatic metastases. The largest of these is in the right lobe of the liver measuring 1.7 cm in diameter. There is also likely some retroperitoneal lymphadenopathy, immediately posterior to the origin of the right renal artery, however, this area is incompletely visualized on today's examination.  Musculoskeletal: Numerous markedly enlarged left axillary and subpectoral lymph nodes, compatible with nodal metastases. The largest node in the left axillary region measures up to 6.3 x 3.8 cm. Again noted are multiple lytic lesions, including a large lytic lesion in the posterior aspect of the T7 vertebral body, lateral aspect of the left 7th rib and lateral aspect of the right 7th rib.  Review of the MIP images confirms the above findings.  IMPRESSION: 1. Limited evaluation for pulmonary embolism demonstrating no evidence of central, lobar or proximal segmental sized pulmonary embolism  at this time. 2. Progression of disease with marked enlargement of right upper lobe mass, development of right hilar and mediastinal lymphadenopathy in addition to marked left axillary and subpectoral lymphadenopathy, probable retroperitoneal lymphadenopathy, malignant right pleural effusion with extensive right pleural nodularity, and widespread metastases throughout the liver. 3. Additional findings, as above. Critical Value/emergent results were called by telephone at the time of interpretation on 12/30/2012 at 9:53 AM to Dr.PREETHA [facility] , who verbally acknowledged these results.   Electronically Signed   By: Trudie Reed M.D.   On: 12/30/2012 09:56   Dg Chest Port 1 View  12/30/2012   CLINICAL DATA:  Status post right chest tube placement  EXAM: PORTABLE CHEST - 1 VIEW  COMPARISON:  CT PE study 12/30/2012  FINDINGS: Interval placement of a pigtail thoracostomy tube with significant improvement in the large right hydro pneumothorax. There is trace residual apical pneumothorax. Stable right paratracheal mass. Right subclavian approach port a catheter is in unchanged position. Persistent atelectasis of the right lower lobe. Cardiac and mediastinal contours are otherwise stable. The left lung is clear. No acute osseous abnormality. Healed right clavicular fracture.  IMPRESSION: Trace residual apical pneumothorax and persistent right lower lobe atelectasis following placement of a right pigtail thoracostomy tube.   Electronically Signed   By: Malachy Moan M.D.   On: 12/30/2012 12:58      MDM   1.  Right leg weakness 2.  Metastatic CA  At this time the patient has a mild tachycardia but states that he is always tachycardic,  he is not having fevers, vomiting, coughing, shortness of breath, dysuria or diarrhea. He has new focal neurologic abnormalities that could be consistent with either his brain CT scan with surrounding edema as well as his lumbar spine scan showing potential nerve  compression.  Will d/w Neuro  Neuro requests admit for MRI L spine and further w/u.    Vida Roller, MD 12/30/12 1700

## 2012-12-27 ENCOUNTER — Ambulatory Visit
Admit: 2012-12-27 | Discharge: 2012-12-27 | Disposition: A | Discharge status: Home / Self Care | Payer: Medicare Other | Attending: Radiation Oncology | Admitting: Radiation Oncology

## 2012-12-27 ENCOUNTER — Ambulatory Visit
Admission: RE | Admit: 2012-12-27 | Discharge: 2012-12-27 | Disposition: A | Discharge status: Home / Self Care | Payer: Medicare Other | Source: Ambulatory Visit | Attending: Radiation Oncology | Admitting: Radiation Oncology

## 2012-12-27 ENCOUNTER — Encounter: Payer: Self-pay | Admitting: Radiation Oncology

## 2012-12-27 DIAGNOSIS — C7951 Secondary malignant neoplasm of bone: Secondary | ICD-10-CM

## 2012-12-27 DIAGNOSIS — C3490 Malignant neoplasm of unspecified part of unspecified bronchus or lung: Secondary | ICD-10-CM | POA: Diagnosis present

## 2012-12-27 DIAGNOSIS — M5416 Radiculopathy, lumbar region: Secondary | ICD-10-CM | POA: Diagnosis present

## 2012-12-27 DIAGNOSIS — C3410 Malignant neoplasm of upper lobe, unspecified bronchus or lung: Secondary | ICD-10-CM

## 2012-12-27 HISTORY — DX: History of falling: Z91.81

## 2012-12-27 HISTORY — DX: Weakness: R53.1

## 2012-12-27 HISTORY — DX: Other specified health status: Z78.9

## 2012-12-27 LAB — CBC
HCT: 28.2 % — ABNORMAL LOW (ref 39.0–52.0)
Hemoglobin: 9.1 g/dL — ABNORMAL LOW (ref 13.0–17.0)
MCH: 32.7 pg (ref 26.0–34.0)
MCHC: 32.3 g/dL (ref 30.0–36.0)
MCV: 101.4 fL — ABNORMAL HIGH (ref 78.0–100.0)
RBC: 2.78 MIL/uL — ABNORMAL LOW (ref 4.22–5.81)

## 2012-12-27 LAB — LIPID PANEL
Cholesterol: 121 mg/dL (ref 0–200)
HDL: 50 mg/dL (ref >39)
Triglycerides: 117 mg/dL (ref <150)
VLDL: 23 mg/dL (ref 0–40)

## 2012-12-27 LAB — BASIC METABOLIC PANEL
BUN: 16 mg/dL (ref 6–23)
CO2: 22 mEq/L (ref 19–32)
Calcium: 11 mg/dL — ABNORMAL HIGH (ref 8.4–10.5)
GFR calc non Af Amer: 80 mL/min — ABNORMAL LOW (ref >90)
Glucose, Bld: 155 mg/dL — ABNORMAL HIGH (ref 70–99)

## 2012-12-27 LAB — HEMOGLOBIN A1C
Hgb A1c MFr Bld: 5.7 % — ABNORMAL HIGH (ref <5.7)
Mean Plasma Glucose: 117 mg/dL — ABNORMAL HIGH (ref <117)

## 2012-12-27 MED ORDER — DEXAMETHASONE SODIUM PHOSPHATE 10 MG/ML IJ SOLN
4.0000 mg | Freq: Four times a day (QID) | INTRAMUSCULAR | Status: DC
  Administered 2012-12-27 – 2012-12-28 (×3): 4 mg via INTRAVENOUS
  Filled 2012-12-27 (×8): qty 0.4

## 2012-12-27 NOTE — Progress Notes (Signed)
Radiation Oncology         [phone] ________________________________  Inpatient Re-Consultation  Name: TORRENCE HAMMACK MRN: [id number]  Date: 12/28/2012  DOB: Jan 02, 1932  WU:JWJX,BJYNWGNFAO R, MD  No ref. provider found   REFERRING PHYSICIAN: No ref. provider found  DIAGNOSIS: Widespread Metastases, Squamous cell Lung cancer  Previous Radiotherapy: On 10-21-12 he received brain SRS:  Left frontal 25mm target was treated using 4 Rapid Arc VMAT Beams to a prescription dose of 18 Gy.  Left 6mm target was treated using the same 4 Rapid Arc VMAT Beams as the adjacent left frontal 25mm lesion to a prescription dose of 20 Gy.   Prior Radiation treatment dates: 02/22/2012-03/07/2012  Site/dose:  1) pelvis and sacrum/30 Wallace Cullens in 10 fractions  2) left scapula and rib/ 8 Gray in 1 fraction  Radiation treatment dates: 08/11/2012-09/03/2012  Site/dose:  1) Pelvis/ sacrum RETREAT / 30 Gy in 15 fractions  2) left Rib RETREAT / 30 Gy in 12 fractions    HISTORY OF PRESENT ILLNESS::Juanjesus N Kertz is a 79 y.o. male who is well known by me. I was going to see him this AM as an outpatient followup to discuss CT imaging that had been ordered for progressive neurologic issues but he was admitted last night due to worsening of his symptoms and since then had an L spine MRI. He has a history of widespread bone metastases and 2 brain metastases and has undergone palliative RT as listed above. He is receiving Tarceva and Xgeva as 4th line systemic therapy, currently. He has visceral disease as well.  The patient spoke with me by phone last week and complained of:  - Unable to absorb what he is reading - Trouble with balance and experiencing a little dizziness - No headache, no nausea, no vision changes - Very bad pain in Rt. Hip that radiates down to his knee (7 out of a 10 on the pain scale)  - he has increased his pain medication to help with this, and states that it is also positional, he can find a  certain position and the pain will go away. - He has not been able to participate in physical therapy for the last 3 days due to his pain.  - Has thrush, but has been given an RX for Diflucan which he just started taking   We thus ordered CT's of the brain/pelvis last week and they were reviewed at [facility] tumor board yesterday. Consensus was that brain looked stable but progressive disease in L spine was likely the cause of his right leg/hip sx.  When I spoke with him this AM, he tells me that Friday night, he had been using his walker but had acute onset numbness and severe weakness in right hip down whole leg. Essentially lost function in RLE, and it "gave out." he fell, couldn't get up.  He had another fall yesterday that prompted them to call EMS. He and his wife also acknowledge a little confusion on the part of Mr Buczynski. He is on dexamethasone with some subjective improvement in motor function.  No HA, nausea, or visual changes.   PREVIOUS RADIATION THERAPY: Yes as above  PAST MEDICAL HISTORY:  has a past medical history of Hyperlipidemia; Hypertension; Aortic stenosis; Gastritis; COPD (chronic obstructive pulmonary disease); Bronchitis; Hepatitis; cataract surgery; Tobacco abuse; Shortness of breath; Retinal vein occlusion (1990); GERD (gastroesophageal reflux disease); Hemorrhoid; Aortic stenosis; Hypertension; Dyslipidemia; Glaucoma; Right rib fracture; Lung mass; SOB (shortness of breath); Cough; Stroke (2002); S/P  radiation therapy (02/22/12 - 03/07/12); Status post chemotherapy; Status post chemotherapy (Stopped 10/03/12); Lung cancer (02/02/2012); Bone metastases; S/P radiation therapy (10/21/12); Status post chemotherapy; On antineoplastic chemotherapy; Right sided weakness (01/05/2013); fall (12/19/2012); and Health care home, active care coordination (12/20/2012).    PAST SURGICAL HISTORY: Past Surgical History  Procedure Laterality Date  . Vasectomy    . Tonsillectomy    . Leg surgery      rt  for nerve impingement  . Collarbone fracture      1977  . Tonsillectomy    . Eye surgery      catarct bil  . Portacath placement  02/08/2012    Procedure: INSERTION PORT-A-CATH;  Surgeon: [name], MD;  Location: [facility] OR;  Service: Thoracic;  Laterality: Right;  . Ct guided core biopsy  01/26/12    Left Lytic Lesion - Metastatic Squamous Cell Carcinoma  . Skin biopsy  02/09/12    Right Upper Back, shave : Melanoma In Situ Arising  From a Dysplastic Nevus    FAMILY HISTORY: family history includes Heart attack in his father and father; Leukemia in his mother; Throat cancer in his father.  SOCIAL HISTORY:  reports that he quit smoking about a year ago. His smoking use included Cigarettes. He has a 130 pack-year smoking history. He has never used smokeless tobacco. He reports that he drinks alcohol. He reports that he does not use illicit drugs.  ALLERGIES: Review of patient's allergies indicates no known allergies.  MEDICATIONS:  Current Facility-Administered Medications  Medication Dose Route Frequency Provider Last Rate Last Dose  . 0.9 %  sodium chloride infusion   Intravenous Continuous Kela Millin, MD 75 mL/hr at 12/27/12 0620    . albuterol (PROVENTIL HFA;VENTOLIN HFA) 108 (90 BASE) MCG/ACT inhaler 2 puff  2 puff Inhalation Q4H PRN Adeline C Viyuoh, MD      . amLODipine (NORVASC) tablet 2.5 mg  2.5 mg Oral Daily Adeline C Viyuoh, MD   2.5 mg at 12/27/12 1027  . aspirin EC tablet 81 mg  81 mg Oral QODAY Adeline C Viyuoh, MD   81 mg at 12/27/12 1028  . atorvastatin (LIPITOR) tablet 20 mg  20 mg Oral q1800 Adeline C Viyuoh, MD      . betaxolol (BETOPTIC-S) 0.25 % ophthalmic suspension 1 drop  1 drop Both Eyes Daily Kela Millin, MD   1 drop at 12/27/12 1028  . betaxolol (BETOPTIC-S) 0.25 % ophthalmic suspension 1 drop  1 drop Right Eye QHS Kela Millin, MD   1 drop at 12/08/2012 2156  . dexamethasone (DECADRON) injection 4 mg  4 mg Intravenous Q6H Ulice Dash, PA-C   4  mg at 12/27/12 1206  . dipyridamole-aspirin (AGGRENOX) 200-25 MG per 12 hr capsule 1 capsule  1 capsule Oral QHS Kela Millin, MD   1 capsule at 12/15/2012 2155  . [START ON 12/28/2012] dipyridamole-aspirin (AGGRENOX) 200-25 MG per 12 hr capsule 1 capsule  1 capsule Oral QODAY Adeline C Viyuoh, MD      . enoxaparin (LOVENOX) injection 40 mg  40 mg Subcutaneous Q24H Kela Millin, MD   40 mg at 01/06/2013 2156  . fluconazole (DIFLUCAN) tablet 100 mg  100 mg Oral Daily Adeline C Viyuoh, MD   100 mg at 12/27/12 1027  . irbesartan (AVAPRO) tablet 300 mg  300 mg Oral Daily Adeline C Viyuoh, MD   300 mg at 12/27/12 1027  . levalbuterol (XOPENEX) nebulizer solution 0.63 mg  0.63  mg Nebulization Q4H PRN Kela Millin, MD      . loratadine (CLARITIN) tablet 10 mg  10 mg Oral Daily Adeline C Viyuoh, MD   10 mg at 12/27/12 1027  . LORazepam (ATIVAN) tablet 1 mg  1 mg Oral QHS PRN Kela Millin, MD   1 mg at January 21, 2013 2207  . magic mouthwash w/lidocaine  5 mL Oral QID PRN Kela Millin, MD      . morphine (MS CONTIN) 12 hr tablet 15 mg  15 mg Oral Q12H Adeline C Viyuoh, MD   15 mg at 12/27/12 1027  . multivitamin with minerals tablet 1 tablet  1 tablet Oral QHS Kela Millin, MD   1 tablet at 01-21-2013 2154  . oxyCODONE (Oxy IR/ROXICODONE) immediate release tablet 5 mg  5 mg Oral Q4H PRN Kela Millin, MD   5 mg at 12/27/12 0802  . pantoprazole (PROTONIX) EC tablet 40 mg  40 mg Oral BID AC Adeline C Viyuoh, MD   40 mg at 12/27/12 0802  . prochlorperazine (COMPAZINE) tablet 10 mg  10 mg Oral Q6H PRN Adeline C Viyuoh, MD      . senna-docusate (Senokot-S) tablet 1 tablet  1 tablet Oral Daily Kela Millin, MD   1 tablet at 12/27/12 1028  . tiotropium (SPIRIVA) inhalation capsule 18 mcg  18 mcg Inhalation Daily Kela Millin, MD   18 mcg at 12/27/12 0948    REVIEW OF SYSTEMS:  Notable for that above.   PHYSICAL EXAM:  height is 5\' 7"  (1.702 m) and weight is 145 lb 8.1 oz (66 kg). His oral  temperature is 97.5 F (36.4 C). His blood pressure is 117/64 and his pulse is 103. His respiration is 18 and oxygen saturation is 94%.   General: Alert and conversant, in no acute distress, had a little more trouble giving a history than his baseline months ago. (ie gets right mixed up with left, gets  [facility] mixed up with [facility]). HEENT: Head is normocephalic.  Extraocular movements are intact. Oropharynx is clear no thrush. Extremities: No cyanosis or edema.  Skin: Ecchymoses on back of right thigh and lower legs Musculoskeletal: weakness in Right hip flexion, R knee flexion, R ankle flexion.  Neurologic: Cranial nerves II through XII are grossly intact.  Speech is fluent. Coordination is slow but intact per finger to nose.  Subjective numbness in right thigh by history but not appreciable by exam. Right pronator drift, mild. Psychiatric: Judgment and insight are intact. Affect is appropriate.    ECOG = 3 0 - Asymptomatic (Fully active, able to carry on all predisease activities without restriction)  1 - Symptomatic but completely ambulatory (Restricted in physically strenuous activity but ambulatory and able to carry out work of a light or sedentary nature. For example, light housework, office work)  2 - Symptomatic, <50% in bed during the day (Ambulatory and capable of all self care but unable to carry out any work activities. Up and about more than 50% of waking hours)  3 - Symptomatic, >50% in bed, but not bedbound (Capable of only limited self-care, confined to bed or chair 50% or more of waking hours)  4 - Bedbound (Completely disabled. Cannot carry on any self-care. Totally confined to bed or chair)  5 - Death   Santiago Glad MM, Creech RH, Tormey DC, et al. [phone]). "Toxicity and response criteria of the [facility]". Am. Evlyn Clines. Oncol. 5 (6): [phone]  LABORATORY DATA:  Lab Results  Component Value Date   WBC 7.2 12/27/2012   HGB 9.1* 12/27/2012    HCT 28.2* 12/27/2012   MCV 101.4* 12/27/2012   PLT 198 12/27/2012   CMP     Component Value Date/Time   NA 136 12/27/2012 0645   NA 145 11/28/2012 0828   K 4.4 12/27/2012 0645   K 3.7 11/28/2012 0828   CL 106 12/27/2012 0645   CL 108* 08/29/2012 1100   CO2 22 12/27/2012 0645   CO2 26 11/28/2012 0828   GLUCOSE 155* 12/27/2012 0645   GLUCOSE 115 11/28/2012 0828   GLUCOSE 117* 08/29/2012 1100   BUN 16 12/27/2012 0645   BUN 16.9 11/28/2012 0828   CREATININE 0.86 12/27/2012 0645   CREATININE 0.9 11/28/2012 0828   CALCIUM 11.0* 12/27/2012 0645   CALCIUM 8.6 11/28/2012 0828   PROT 6.0 01-04-2013 1345   PROT 5.9* 11/28/2012 0828   ALBUMIN 2.4* 01-04-2013 1345   ALBUMIN 2.5* 11/28/2012 0828   AST 46* 01-04-2013 1345   AST 23 11/28/2012 0828   ALT 26 2013-01-04 1345   ALT 29 11/28/2012 0828   ALKPHOS 117 01/04/2013 1345   ALKPHOS 61 11/28/2012 0828   BILITOT 0.6 Jan 04, 2013 1345   BILITOT 0.70 11/28/2012 0828   GFRNONAA 80* 12/27/2012 0645   GFRAA >90 12/27/2012 0645         RADIOGRAPHY: Dg Chest 2 View  01-04-13   CLINICAL DATA:  Stroke symptoms. History of hypertension. History of lung cancer.  EXAM: CHEST  2 VIEW  COMPARISON:  12/04/2012  FINDINGS: Ovoid mass lesion in the medial aspect of the right upper lung adjacent to the paratracheal stripe. Mass is measured at approximately 5.4 x 8.5 cm. There is suggestion of mild effacement of the trachea. The mass measures larger than on the previous study, but some of this may be due to differences in technique. Stable location of right central venous catheter. Normal heart size and pulmonary vascularity. Emphysematous changes in the lungs. No blunting of costophrenic angles. No pneumothorax. No developing airspace disease. Old fracture deformity of the right clavicle. Degenerative changes in the spine.  IMPRESSION: Right upper lung mass measures larger than on previous study, possibly due to differences in technique. Emphysematous changes. No  developing consolidation.   Electronically Signed   By: Burman Nieves M.D.   On: 01-04-2013 21:22    Ct Head Wo Contrast  01-04-2013   CLINICAL DATA:  Headache. Intracranial metastatic disease. Lung cancer.  EXAM: CT HEAD WITHOUT CONTRAST  TECHNIQUE: Contiguous axial images were obtained from the base of the skull through the vertex without intravenous contrast.  COMPARISON:  12/22/2012  FINDINGS: Centrally necrotic left frontal lobe mass noted with surrounding vasogenic edema, overall not significantly changed in appearance of the past 4 days. No new hemorrhage observed. The brainstem, cerebellum, thalami, and basal ganglia appear unremarkable. No hydrocephalus or brain herniation. No intracranial hemorrhage or acute CVA.  Atherosclerotic calcification of the carotid siphons.  IMPRESSION: 1. Overall stable appearance of left for frontal lobe mass with surrounding vasogenic edema. No new hemorrhage or significant change.   Electronically Signed   By: Herbie Baltimore M.D.   On: 04-Jan-2013 14:41   Ct Head W Wo Contrast  12/22/2012   CLINICAL DATA:  Lung carcinoma diagnosed in November 2013. Chemotherapy in progress. Radiation therapy complete. Patient with bone and brain metastatic disease. Now with dizziness and right leg numbness and weakness and unsteady gait.  EXAM: CT HEAD WITHOUT  AND WITH CONTRAST  TECHNIQUE: Contiguous axial images were obtained from the base of the skull through the vertex without and with intravenous contrast  CONTRAST:  OMNIPAQUE IOHEXOL 300 MG/ML  SOLN  COMPARISON:  Head CT, 10/07/2012.  Brain MRI, 11/15/2012.  FINDINGS: The enhancing left frontal lobe metastatic lesion measures 2.3 cm in greatest transverse dimension, without significant change from the prior CT. The 3 mm satellite lesion seen below this in the left frontal lobe white matter is also stable. White matter hypoattenuation surrounding left frontal lobe lesion consistent with vasogenic edema shows no  significant change.  There are no new parenchymal masses. There are no other areas of abnormal parenchymal enhancement.  Other patchy areas of white matter hypoattenuation are noted consistent with a combination of chronic microvascular ischemic change and old lacunar infarcts. These teres are stable. There is no evidence of a recent cortical infarct. Ventricles are normal in configuration. There is ventricle and sulcal enlargement reflecting age related volume loss.  No extra-axial masses or abnormal fluid collections. There is no abnormal extra-axial enhancement.  No intracranial hemorrhage.  No skull lesion. The visualized sinuses, mastoid air cells and middle ear cavities are clear.  IMPRESSION: 1. Stable metastatic lesions in the left frontal lobe as detailed. Stable associated left frontal lobe vasogenic edema. 2. No new metastatic lesions. 3. No acute findings. No evidence of a recent cortical infarct. No intracranial hemorrhage. 4. No hydrocephalus. Stable old lacunar infarcts and chronic microvascular ischemic change.   Electronically Signed   By: Amie Portland M.D.   On: 12/22/2012 14:43   Ct Pelvis Wo Contrast  12/22/2012   CLINICAL DATA:  Metastatic lung cancer. Right leg numbness and weakness. Unsteady gait. Right hip and lower back pain.  EXAM: CT PELVIS WITHOUT CONTRAST  TECHNIQUE: Multidetector CT imaging of the pelvis was performed following the standard protocol without intravenous contrast.  COMPARISON:  CT scans dated 10/21/2012 and 07/04/2012  FINDINGS: There has been no change in the pathologic fracture of L3. There is new abnormal soft tissue density seen at the level of the right neural foramen at L3-4. This does not appear to be disk material and the appearance would be atypical for tumor but it is definitely new.  The extensive metastatic disease in the sacrum extends around the right S1 nerve and affects the right S2 nerve as well and touches the sciatic nerve. However, this appears  essentially unchanged. The right iliac lesion is unchanged. There are no new lesions in the pelvis or hips. No muscle atrophy. There are new. Aortic metastatic lymph nodes at the L3 and L4 levels.  IMPRESSION: 1. New soft tissue density in the right neural foramen at L3-4 which should affect the right L3 nerve. The etiology of this soft tissue is unknown but it is new and given the patient's metastatic disease it probably represents tumor. 2. New periaortic and retrocaval adenopathy at L3 and L4. 3. No change in the metastatic lesions in the right ilium and in the sacrum. The sacral lesion does involve the right S1 and S2 nerves and touches the right sciatic nerve.   Electronically Signed   By: Geanie Cooley M.D.   On: 12/22/2012 15:21   Mr Lumbar Spine W Wo Contrast  01-02-2013   CLINICAL DATA:  End stage IV non-small cell lung cancer with no metastasis. Right lower extremity pain and weakness.  EXAM: MRI LUMBAR SPINE WITHOUT AND WITH CONTRAST  TECHNIQUE: Multiplanar and multiecho pulse sequences of the lumbar  spine were obtained without and with intravenous contrast.  CONTRAST:  13mL MULTIHANCE GADOBENATE DIMEGLUMINE 529 MG/ML IV SOLN  COMPARISON:  CT of the abdomen and pelvis with contrast 12/22/2012  FINDINGS: Normal signal is present in the conus medullaris which terminates the at L1, within normal limits. A pathologic L3 compression fracture is present. The right peritracheal lymph node below the screws the right hemidiaphragm measures 9 mm in short axis. Pericaval lymph nodes fresh that multiple aortocaval lymph nodes are present. The largest is just anterior to the L2-3 disc level, measuring 19 x 17 mm on the axial images. A retro aortic lymph node at L4-5 and the measures 10 x 21 mm. The tumor within the L3 vertebral body extends the on the margins an into the adjacent psoas muscles, right greater than left. The several caudad extent of tumor is greater than 4 point 8 cm.  Extensive cystic tumor and  pathologic fracture is present within the sacrum at the level of S2, right greater than left. A cystic lesion is present within the right iliac bone as well.  The disc levels at L1-2 and above are normal.  L2-3: Slight disc bulging is present. There is no significant stenosis.  L3-4: Extramedullary tumor extends into the lateral recess and foramina bilaterally, right greater than left. Severe right foraminal in lateral recess narrowing is evident. More mild disease is present on the left.  L4-5: Mild disk bulging and facet hypertrophy is present. Mild foraminal narrowing is noted.  L5-S1: Mild facet hypertrophy is present. There is no significant stenosis.  IMPRESSION: 1. Pathologic burst fracture at L3 with some retropulsion of bone and extensive epidural tumor extending into the lateral recess and foramina bilaterally, right greater than left. 2. Moderate to severe right lateral recess and foraminal stenosis at L3-4. 3. Extensive retroperitoneal lymphadenopathy. 4. Mild left foraminal narrowing at L3-4. 5. Extra medullary tumor extends into the psoas muscles bilaterally, right greater than left. 6. Heterogeneous cystic lesions are present within the sacrum and right iliac bone. These likely represent metastases as well. 7. Multiple cysts are present within the kidneys bilaterally.   Electronically Signed   By: Gennette Pac M.D.   On: 01-18-2013 19:00      IMPRESSION/PLAN:  I appreciate neurology's input and have spoken personally with the neuro team. After his L spine MRI was done, we concur his RLE sx are likely from the L3/4 disease.  I have looked at his MRI with Dr. Newell Coral of [facility] who concurs.  This lesion could be debulked, but based on my discussion with Dr. Arbutus Ped, we agree he has a guarded prognosis and palliative RT may be a better and less risky first line palliative therapy.   I discussed all of the above with the patient and his wife. They are enthusiastic to proceed with RT - I plan to  simulate him today, and start 30Gy/10 fractions to L2-L5 tomorrow.  Most common side effects would include fatigue, skin irritation, acute GI upset, possibly. Internal organ injury would be uncommon. The amount of overlap between current and prior fields will be minimized to decrease risk of side effects.  I spent 40 minutes  face to face with the patient and more than 50% of that time was spent in counseling and/or coordination of care.    __________________________________________   Lonie Peak, MD

## 2012-12-27 NOTE — Evaluation (Signed)
I have reviewed this note and agree with all findings. Kati Alayzha An, PT, DPT Pager: [phone]   

## 2012-12-27 NOTE — Progress Notes (Signed)
OT Cancellation Note  Patient Details Name: Joel Gibson MRN: [id number] DOB: [birth year]   Cancelled Treatment:    Reason Eval/Treat Not Completed: Other (comment).  Pt was fatiqued and in pain after PT.  Will check back tomorrow.    Goldye Tourangeau 12/27/2012, 1:23 PM [name], OTR/L [phone] 12/27/2012

## 2012-12-27 NOTE — Progress Notes (Signed)
NEURO HOSPITALIST PROGRESS NOTE   SUBJECTIVE:                                                                                                                         No change in strength today, has tolerated Decadron well.  Has spoken to Rad Onc and plan it to attempt Radiation prior to any debulking proceedures  OBJECTIVE:                                                                                                                           Vital signs in last 24 hours: Temp:  [97.5 F (36.4 C)-98.6 F (37 C)] 97.5 F (36.4 C) (10/21 0525) Pulse Rate:  [103-113] 103 (10/21 0525) Resp:  [18-19] 18 (10/21 0525) BP: (117-125)/(54-65) 117/64 mmHg (10/21 0525) SpO2:  [92 %-98 %] 96 % (10/21 0525) Weight:  [66 kg (145 lb 8.1 oz)] 66 kg (145 lb 8.1 oz) (10/20 1712)  Intake/Output from previous day: 10/20 0701 - 10/21 0700 In: 454.2 [I.V.:454.2] Out: 301 [Urine:301] Intake/Output this shift:   Nutritional status: Cardiac  Past Medical History  Diagnosis Date  . Hyperlipidemia   . Hypertension   . Aortic stenosis     mild to moderate  . Gastritis   . COPD (chronic obstructive pulmonary disease)   . Bronchitis   . Hepatitis     possible  . Hx of cataract surgery     lt eye  . Tobacco abuse   . Shortness of breath     with activity  . Retinal vein occlusion 1990  . GERD (gastroesophageal reflux disease)   . Hemorrhoid   . Aortic stenosis   . Hypertension   . Dyslipidemia   . Glaucoma   . Right rib fracture      Pathological Fracture / 4 weeks ago  . Lung mass     Right Upper Lobe - Lobular Mass  . SOB (shortness of breath)     Mild  . Cough   . Stroke 2002    Right slight difference in vision  . S/P radiation therapy 02/22/12 - 03/07/12    Pelvis/Sacrum/30 Gray/10 Fractions and Left Scapula/Rib/ 8 Gray/1 Fraction   . Status post chemotherapy  Carboplatin/ Abraxane  . Status post chemotherapy Stopped 10/03/12     Gemcitabine  - Progressed  . Lung cancer 02/02/2012  . Bone metastases     Widespread/ Lytic Lesion Left 7t Rib, Left Iliac Pelvis, ribs and scapula  . S/P radiation therapy 10/21/12    SRS Brain / Left Frontal / 20 Gy  . Status post chemotherapy     Docetaxel with Neulasta Support - Stopped due to Intolerance and Persistent Thrombocytopenia   . On antineoplastic chemotherapy     Tarceva daily / Xgeva Q 4 weeks  . Right sided weakness 12/19/2012  . Hx of fall 12/07/2012  . Health care home, active care coordination 12/22/2012     Neurologic Exam:  Mental Status: Alert, oriented, thought content appropriate.  Speech fluent without evidence of aphasia.  Able to follow 3 step commands without difficulty. Cranial Nerves: II: visual fields grossly normal, pupils equal, round, reactive to light and accommodation III,IV, VI: ptosis not present, extraocular muscles extra-ocular motions intact bilaterally V,VII: smile symmetric, facial light touch sensation normal bilaterally VIII: hearing normal bilaterally IX,X: gag reflex present XI: trapezius strength/neck flexion strength normal bilaterally XII: tongue strength normal  Motor: Right : Upper extremity    Left:     Upper extremity 5/5 deltoid       5/5 deltoid 5/5 tricep      5/5 tricep 5/5 biceps      5/5 biceps  5/5wrist flexion     5/5 wrist flexion 5/5 wrist extension     5/5 wrist extension 5/5 hand grip      5/5 hand grip  Lower extremity     Lower extremity 4/5 hip flexor      5/5 hip flexor 4/5 hip adductors     5/5 hip adductors 4/5 hip abductors     5/5 hip abductors 3/5 quadricep      5/5 quadriceps  2/5 hamstrings     5/5 hamstrings 1/5 plantar flexion       5/5 plantar flexion 0/5 plantar extension     5/5 plantar extension Tone and bulk:normal tone throughout; no atrophy noted Sensory: Pinprick and light touch intact throughout, bilaterally Deep Tendon Reflexes:  Right: Upper Extremity   Left: Upper extremity   biceps  (C-5 to C-6) 2/4   biceps (C-5 to C-6) 2/4 tricep (C7) 2/4    triceps (C7) 2/4 Brachioradialis (C6) 2/4  Brachioradialis (C6) 2/4  Lower Extremity Lower Extremity  quadriceps (L-2 to L-4) 1/4   quadriceps (L-2 to L-4) 2/4 Achilles (S1) 0/4   Achilles (S1) 0/4 Plantars: Right: upgoing   Left: downgoing   Lab Results: Lab Results  Component Value Date/Time   CHOL 121 12/27/2012  6:45 AM   Lipid Panel  Recent Labs  12/27/12 0645  CHOL 121  TRIG 117  HDL 50  CHOLHDL 2.4  VLDL 23  LDLCALC 48    Studies/Results: Dg Chest 2 View  12/29/2012   CLINICAL DATA:  Stroke symptoms. History of hypertension. History of lung cancer.  EXAM: CHEST  2 VIEW  COMPARISON:  12/04/2012  FINDINGS: Ovoid mass lesion in the medial aspect of the right upper lung adjacent to the paratracheal stripe. Mass is measured at approximately 5.4 x 8.5 cm. There is suggestion of mild effacement of the trachea. The mass measures larger than on the previous study, but some of this may be due to differences in technique. Stable location of right central venous catheter. Normal heart size and pulmonary vascularity. Emphysematous  changes in the lungs. No blunting of costophrenic angles. No pneumothorax. No developing airspace disease. Old fracture deformity of the right clavicle. Degenerative changes in the spine.  IMPRESSION: Right upper lung mass measures larger than on previous study, possibly due to differences in technique. Emphysematous changes. No developing consolidation.   Electronically Signed   By: Burman Nieves M.D.   On: 07-Jan-2013 21:22   Ct Head Wo Contrast  January 07, 2013   CLINICAL DATA:  Headache. Intracranial metastatic disease. Lung cancer.  EXAM: CT HEAD WITHOUT CONTRAST  TECHNIQUE: Contiguous axial images were obtained from the base of the skull through the vertex without intravenous contrast.  COMPARISON:  12/22/2012  FINDINGS: Centrally necrotic left frontal lobe mass noted with surrounding vasogenic  edema, overall not significantly changed in appearance of the past 4 days. No new hemorrhage observed. The brainstem, cerebellum, thalami, and basal ganglia appear unremarkable. No hydrocephalus or brain herniation. No intracranial hemorrhage or acute CVA.  Atherosclerotic calcification of the carotid siphons.  IMPRESSION: 1. Overall stable appearance of left for frontal lobe mass with surrounding vasogenic edema. No new hemorrhage or significant change.   Electronically Signed   By: Herbie Baltimore M.D.   On: 01-07-13 14:41   Mr Lumbar Spine W Wo Contrast  01-07-2013   CLINICAL DATA:  End stage IV non-small cell lung cancer with no metastasis. Right lower extremity pain and weakness.  EXAM: MRI LUMBAR SPINE WITHOUT AND WITH CONTRAST  TECHNIQUE: Multiplanar and multiecho pulse sequences of the lumbar spine were obtained without and with intravenous contrast.  CONTRAST:  13mL MULTIHANCE GADOBENATE DIMEGLUMINE 529 MG/ML IV SOLN  COMPARISON:  CT of the abdomen and pelvis with contrast 12/22/2012  FINDINGS: Normal signal is present in the conus medullaris which terminates the at L1, within normal limits. A pathologic L3 compression fracture is present. The right peritracheal lymph node below the screws the right hemidiaphragm measures 9 mm in short axis. Pericaval lymph nodes fresh that multiple aortocaval lymph nodes are present. The largest is just anterior to the L2-3 disc level, measuring 19 x 17 mm on the axial images. A retro aortic lymph node at L4-5 and the measures 10 x 21 mm. The tumor within the L3 vertebral body extends the on the margins an into the adjacent psoas muscles, right greater than left. The several caudad extent of tumor is greater than 4 point 8 cm.  Extensive cystic tumor and pathologic fracture is present within the sacrum at the level of S2, right greater than left. A cystic lesion is present within the right iliac bone as well.  The disc levels at L1-2 and above are normal.  L2-3:  Slight disc bulging is present. There is no significant stenosis.  L3-4: Extramedullary tumor extends into the lateral recess and foramina bilaterally, right greater than left. Severe right foraminal in lateral recess narrowing is evident. More mild disease is present on the left.  L4-5: Mild disk bulging and facet hypertrophy is present. Mild foraminal narrowing is noted.  L5-S1: Mild facet hypertrophy is present. There is no significant stenosis.  IMPRESSION: 1. Pathologic burst fracture at L3 with some retropulsion of bone and extensive epidural tumor extending into the lateral recess and foramina bilaterally, right greater than left. 2. Moderate to severe right lateral recess and foraminal stenosis at L3-4. 3. Extensive retroperitoneal lymphadenopathy. 4. Mild left foraminal narrowing at L3-4. 5. Extra medullary tumor extends into the psoas muscles bilaterally, right greater than left. 6. Heterogeneous cystic lesions are present within the  sacrum and right iliac bone. These likely represent metastases as well. 7. Multiple cysts are present within the kidneys bilaterally.   Electronically Signed   By: Gennette Pac M.D.   On: 01/01/2013 19:00    MEDICATIONS                                                                                                                        Scheduled: . amLODipine  2.5 mg Oral Daily  . aspirin EC  81 mg Oral QODAY  . atorvastatin  20 mg Oral q1800  . betaxolol  1 drop Both Eyes Daily  . betaxolol  1 drop Right Eye QHS  . dexamethasone  4 mg Intravenous Q6H  . dipyridamole-aspirin  1 capsule Oral QHS  . [START ON 12/28/2012] dipyridamole-aspirin  1 capsule Oral QODAY  . enoxaparin (LOVENOX) injection  40 mg Subcutaneous Q24H  . fluconazole  100 mg Oral Daily  . irbesartan  300 mg Oral Daily  . loratadine  10 mg Oral Daily  . morphine  15 mg Oral Q12H  . multivitamin with minerals  1 tablet Oral QHS  . pantoprazole  40 mg Oral BID AC  . senna-docusate  1  tablet Oral Daily  . tiotropium  18 mcg Inhalation Daily    ASSESSMENT/PLAN:                                                                                                            79 YO male with right leg weakness and MRI showing extensive epidural tumor extending into the lateral recess and foramina bilaterally, right greater than left.  Radiation oncology has consulted with patient and plan is to go further with radiation.  Neurology will S/O  Assessment and plan discussed with with attending physician and they are in agreement.    Felicie Morn PA-C [facility] [phone]  12/27/2012, 9:49 AM

## 2012-12-27 NOTE — Progress Notes (Signed)
[facility] PROGRESS NOTE  Joel Gibson ZOX:[id number] DOB: Feb 19, 1932 DOA: 12/27/2012 PCP: [name], MD  Assessment/Plan: . Right leg weakness/Pain-secondary to L3 pathologPathologic burst fracture with some retropulsion of bone and extensive epidural tumor as per MRI -continue decadron, some improvement overnight, follo  -Raiation onc/Dr Basilio Cairo to see and radiation planned -Discussed with neuro, no need for MRI brain as lumbar MRI findings etiology of his symptoms -CT head with vasogenic edema, will continue with IV decadron q6  -follow BG on steroids and further treat accordingly -PT/OT consulted . Hypercalcemia  -secondary to malignancy/bone mets  -trending down on IVF, steroids   -follow recheck iin am and if not further improving consider pamidronate . Bone metastases/Lung cancer  - Dr Arbutus Ped to follow and rad onc consulted as above . Hypertension  -continue outpt meds  . GERD (gastroesophageal reflux disease)  -place on PPI  . COPD (chronic obstructive pulmonary disease)  -Stable, continue outpt meds, add prn nebs  . Anemia of chronic disease  -stable, monitor and further manage as appropriate      Code Status: full Family Communication: wife at bedside  Disposition Plan: pending clinical course   Consultants:  Neuro  Radiation -onc  onc  Procedures:  none  Antibiotics:  none  HPI/Subjective: States able to move R. Leg better today  Objective: Filed Vitals:   12/27/12 0525  BP: 117/64  Pulse: 103  Temp: 97.5 F (36.4 C)  Resp: 18    Intake/Output Summary (Last 24 hours) at 12/27/12 0917 Last data filed at 12/27/12 0800  Gross per 24 hour  Intake 454.17 ml  Output    301 ml  Net 153.17 ml   Filed Weights   12/28/2012 1712  Weight: 66 kg (145 lb 8.1 oz)    Exam:  General: alert & oriented x 3 In NAD Cardiovascular: RRR, nl S1 s2 Respiratory: decreased BS at bases, no crackles or wheezes Abdomen: soft +BS NT/ND, no  masses palpable Extremities: No cyanosis and no edema, RLE foot strength 2-3/5 and rest of R.Leg 4-5/5, L side strength 5/5     Data Reviewed: Basic Metabolic Panel:  Recent Labs Lab 12/21/2012 1345 12/27/12 0645  NA 135 136  K 4.2 4.4  CL 104 106  CO2 22 22  GLUCOSE 112* 155*  BUN 15 16  CREATININE 0.93 0.86  CALCIUM 11.3* 11.0*   Liver Function Tests:  Recent Labs Lab 01/06/2013 1345  AST 46*  ALT 26  ALKPHOS 117  BILITOT 0.6  PROT 6.0  ALBUMIN 2.4*   No results found for this basename: LIPASE, AMYLASE,  in the last 168 hours No results found for this basename: AMMONIA,  in the last 168 hours CBC:  Recent Labs Lab 12/20/2012 1345 12/27/12 0645  WBC 9.2 7.2  NEUTROABS 7.3  --   HGB 8.7* 9.1*  HCT 27.8* 28.2*  MCV 102.2* 101.4*  PLT 197 198   Cardiac Enzymes: No results found for this basename: CKTOTAL, CKMB, CKMBINDEX, TROPONINI,  in the last 168 hours BNP (last 3 results) No results found for this basename: PROBNP,  in the last 8760 hours CBG: No results found for this basename: GLUCAP,  in the last 168 hours  No results found for this or any previous visit (from the past 240 hour(s)).   Studies: Dg Chest 2 View  12/17/2012   CLINICAL DATA:  Stroke symptoms. History of hypertension. History of lung cancer.  EXAM: CHEST  2 VIEW  COMPARISON:  12/04/2012  FINDINGS:  Ovoid mass lesion in the medial aspect of the right upper lung adjacent to the paratracheal stripe. Mass is measured at approximately 5.4 x 8.5 cm. There is suggestion of mild effacement of the trachea. The mass measures larger than on the previous study, but some of this may be due to differences in technique. Stable location of right central venous catheter. Normal heart size and pulmonary vascularity. Emphysematous changes in the lungs. No blunting of costophrenic angles. No pneumothorax. No developing airspace disease. Old fracture deformity of the right clavicle. Degenerative changes in the spine.   IMPRESSION: Right upper lung mass measures larger than on previous study, possibly due to differences in technique. Emphysematous changes. No developing consolidation.   Electronically Signed   By: Burman Nieves M.D.   On: 12/25/2012 21:22   Ct Head Wo Contrast  12/12/2012   CLINICAL DATA:  Headache. Intracranial metastatic disease. Lung cancer.  EXAM: CT HEAD WITHOUT CONTRAST  TECHNIQUE: Contiguous axial images were obtained from the base of the skull through the vertex without intravenous contrast.  COMPARISON:  12/22/2012  FINDINGS: Centrally necrotic left frontal lobe mass noted with surrounding vasogenic edema, overall not significantly changed in appearance of the past 4 days. No new hemorrhage observed. The brainstem, cerebellum, thalami, and basal ganglia appear unremarkable. No hydrocephalus or brain herniation. No intracranial hemorrhage or acute CVA.  Atherosclerotic calcification of the carotid siphons.  IMPRESSION: 1. Overall stable appearance of left for frontal lobe mass with surrounding vasogenic edema. No new hemorrhage or significant change.   Electronically Signed   By: Herbie Baltimore M.D.   On: 12/27/2012 14:41   Mr Lumbar Spine W Wo Contrast  12/11/2012   CLINICAL DATA:  End stage IV non-small cell lung cancer with no metastasis. Right lower extremity pain and weakness.  EXAM: MRI LUMBAR SPINE WITHOUT AND WITH CONTRAST  TECHNIQUE: Multiplanar and multiecho pulse sequences of the lumbar spine were obtained without and with intravenous contrast.  CONTRAST:  13mL MULTIHANCE GADOBENATE DIMEGLUMINE 529 MG/ML IV SOLN  COMPARISON:  CT of the abdomen and pelvis with contrast 12/22/2012  FINDINGS: Normal signal is present in the conus medullaris which terminates the at L1, within normal limits. A pathologic L3 compression fracture is present. The right peritracheal lymph node below the screws the right hemidiaphragm measures 9 mm in short axis. Pericaval lymph nodes fresh that multiple  aortocaval lymph nodes are present. The largest is just anterior to the L2-3 disc level, measuring 19 x 17 mm on the axial images. A retro aortic lymph node at L4-5 and the measures 10 x 21 mm. The tumor within the L3 vertebral body extends the on the margins an into the adjacent psoas muscles, right greater than left. The several caudad extent of tumor is greater than 4 point 8 cm.  Extensive cystic tumor and pathologic fracture is present within the sacrum at the level of S2, right greater than left. A cystic lesion is present within the right iliac bone as well.  The disc levels at L1-2 and above are normal.  L2-3: Slight disc bulging is present. There is no significant stenosis.  L3-4: Extramedullary tumor extends into the lateral recess and foramina bilaterally, right greater than left. Severe right foraminal in lateral recess narrowing is evident. More mild disease is present on the left.  L4-5: Mild disk bulging and facet hypertrophy is present. Mild foraminal narrowing is noted.  L5-S1: Mild facet hypertrophy is present. There is no significant stenosis.  IMPRESSION: 1. Pathologic burst  fracture at L3 with some retropulsion of bone and extensive epidural tumor extending into the lateral recess and foramina bilaterally, right greater than left. 2. Moderate to severe right lateral recess and foraminal stenosis at L3-4. 3. Extensive retroperitoneal lymphadenopathy. 4. Mild left foraminal narrowing at L3-4. 5. Extra medullary tumor extends into the psoas muscles bilaterally, right greater than left. 6. Heterogeneous cystic lesions are present within the sacrum and right iliac bone. These likely represent metastases as well. 7. Multiple cysts are present within the kidneys bilaterally.   Electronically Signed   By: Gennette Pac M.D.   On: 12/27/2012 19:00    Scheduled Meds: . amLODipine  2.5 mg Oral Daily  . aspirin EC  81 mg Oral QODAY  . atorvastatin  20 mg Oral q1800  . betaxolol  1 drop Both Eyes  Daily  . betaxolol  1 drop Right Eye QHS  . dexamethasone  4 mg Intravenous Q6H  . dipyridamole-aspirin  1 capsule Oral QHS  . [START ON 12/28/2012] dipyridamole-aspirin  1 capsule Oral QODAY  . enoxaparin (LOVENOX) injection  40 mg Subcutaneous Q24H  . fluconazole  100 mg Oral Daily  . irbesartan  300 mg Oral Daily  . loratadine  10 mg Oral Daily  . morphine  15 mg Oral Q12H  . multivitamin with minerals  1 tablet Oral QHS  . pantoprazole  40 mg Oral BID AC  . senna-docusate  1 tablet Oral Daily  . tiotropium  18 mcg Inhalation Daily   Continuous Infusions: . sodium chloride 75 mL/hr at 12/27/12 0620    Active Problems:   Hypertension   Lung cancer   COPD (chronic obstructive pulmonary disease)   GERD (gastroesophageal reflux disease)   Bone metastases   Anemia of chronic disease   Right leg weakness   Hypercalcemia   Acute lumbar radiculopathy   Metastasis from malignant tumor of lung    Time spent: 35    [facility]  [facility] Pager [phone]. If 7PM-7AM, please contact night-coverage at [email or web address], password [facility] 12/27/2012, 9:17 AM  LOS: 1 day

## 2012-12-27 NOTE — Progress Notes (Signed)
  Radiation Oncology         [phone] ________________________________  Name: Joel Gibson MRN: [id number]  Date: 12/24/2012  DOB: [birth year]  SIMULATION AND TREATMENT PLANNING NOTE  inpatient  DIAGNOSIS:  Lumbar spine metastases  NARRATIVE:  The patient was brought to the CT Simulation planning suite.  Identity was confirmed.  All relevant records and images related to the planned course of therapy were reviewed.  The patient freely provided informed written consent to proceed with treatment after reviewing the details related to the planned course of therapy. The consent form was witnessed and verified by the simulation staff.    Then, the patient was set-up in a stable reproducible  supine position for radiation therapy.  CT images were obtained.  Surface markings were placed.  The CT images were loaded into the planning software.    TREATMENT PLANNING NOTE: Treatment planning then occurred.  The radiation prescription was entered and confirmed.    A total of 3 medically necessary complex treatment devices were fabricated and supervised by me AP PA fields with MLCs to block bowel and kidneys, as well as a vaclock. I have requested : 3D Simulation  I have requested a DVH of the following structures: cord, kidneys, CTV.   The patient will receive 30 Gy in 10  Fractions to his spine from L2-L5.  Special Treatment Procedure Note: The patient received prior radiotherapy close to his current fields. There could be some overlap of radiation dose.  Prior regional radiotherapy increases the risk of side effects from treatment. I have considered this in the treatment planning process and have aimed to minimize tissue overlap.  This increases the complexity of this patient's treatment and therefore this constitutes a special treatment procedure.   -----------------------------------  Lonie Peak, MD

## 2012-12-27 NOTE — H&P (Addendum)
[facility] History and Physical(initial H&P of 10/20 was mistakenly labelled as Progress note)   Joel Gibson UXL:[id number] DOB: 05-31-1931 DOA: 12/17/2012   Referring physician: Dr Hyacinth Meeker PCP: [name], MD   Specialists:    Chief Complaint: R. Leg weakness and pain   HPI: Joel Gibson is a 79 y.o. male with multiple medical problems as listed below including h/o Lung ca diagnosed in 2013 (and followed by Dr Arbutus Ped) metastatic to brain and and bones and s/p stereotactic radiotherapy (followed by Dr Basilio Cairo), CVA, HTN, AS who presents with the above complaints. He states that he developed pain from his hip area down to knee and R. Leg weakness 3-4days ago and it has worsened to where he is unable to move that leg. Per daughter pt had unwitnessed fall(although wife denies) prior to the worsening of these symptoms and that he has had some weakness for the past 1mos. Pt denies visual changes, dysphagia, slurred speech and no parasthesias reported.He was seen in the ED and head CT showed- stable appearance of left for frontal lobe mass with surrounding vasogenic edema and no new hemorrhage. Neuro was consulted and iv decadron recommended per Dr Roseanne Reno and admission to [facility] for further eval and management.     Review of Systems: The patient denies anorexia, fever, weight loss, vision loss, decreased hearing, hoarseness, chest pain, syncope, dyspnea on exertion, peripheral edema, hemoptysis, abdominal pain, melena, hematochezia, severe indigestion/heartburn, hematuria, incontinence, transient blindness, depression, unusual weight change, abnormal bleeding.      Past Medical History   Diagnosis  Date   .  Hyperlipidemia     .  Hypertension     .  Aortic stenosis         mild to moderate   .  Gastritis     .  COPD (chronic obstructive pulmonary disease)     .  Bronchitis     .  Hepatitis         possible   .  Hx of cataract surgery         lt eye   .  Tobacco abuse      .  Shortness of breath         with activity   .  Retinal vein occlusion  1990   .  GERD (gastroesophageal reflux disease)     .  Hemorrhoid     .  Aortic stenosis     .  Hypertension     .  Dyslipidemia     .  Glaucoma     .  Right rib fracture          Pathological Fracture / 4 weeks ago   .  Lung mass         Right Upper Lobe - Lobular Mass   .  SOB (shortness of breath)         Mild   .  Cough     .  Stroke  2002       Right slight difference in vision   .  S/P radiation therapy  02/22/12 - 03/07/12       Pelvis/Sacrum/30 Gray/10 Fractions and Left Scapula/Rib/ 8 Gray/1 Fraction    .  Status post chemotherapy          Carboplatin/ Abraxane   .  On antineoplastic chemotherapy         Gemcitabine and Xgeva   .  Lung cancer  02/02/2012   .  Bone metastases  Widespread/ Lytic Lesion Left 7t Rib, Left Iliac Pelvis, ribs and scapula    Past Surgical History   Procedure  Laterality  Date   .  Vasectomy       .  Tonsillectomy       .  Leg surgery           rt for nerve impingement   .  Collarbone fracture           1977   .  Tonsillectomy       .  Eye surgery           catarct bil   .  Portacath placement    02/08/2012       Procedure: INSERTION PORT-A-CATH;  Surgeon: [name], MD;  Location: [facility] OR;  Service: Thoracic;  Laterality: Right;   .  Ct guided core biopsy    01/26/12       Left Lytic Lesion - Metastatic Squamous Cell Carcinoma   .  Skin biopsy    02/09/12       Right Upper Back, shave : Melanoma In Situ Arising  From a Dysplastic Nevus    Social History: reports that he quit smoking about a year ago. His smoking use included Cigarettes. He has a 130 pack-year smoking history. He has never used smokeless tobacco. He reports that he drinks alcohol. He reports that he does not use illicit drugs.  where does patient live--home     No Known Allergies    Family History   Problem  Relation  Age of Onset   .  Heart attack  Father         x7   .   Throat cancer  Father         Deceased from Staph Infection   .  Heart attack  Father         7 Times   .  Leukemia  Mother         Prior to Admission medications    Medication  Sig  Start Date  End Date  Taking?  Authorizing Provider   albuterol (PROVENTIL HFA;VENTOLIN HFA) 108 (90 BASE) MCG/ACT inhaler  Inhale 2 puffs into the lungs every 4 (four) hours as needed for wheezing or shortness of breath.       Yes  Historical Provider, MD   Alum & Mag Hydroxide-Simeth (MAGIC MOUTHWASH W/LIDOCAINE) SOLN  Take 5 mLs by mouth 4 (four) times daily as needed (do no swallow.  swish a spit QID prn).  12/19/12    Yes  Adrena E Johnson, PA-C   amLODipine (NORVASC) 2.5 MG tablet  Take 1 tablet (2.5 mg total) by mouth daily.  10/13/12    Yes  Peter M Swazil[place], MD   aspirin EC 81 MG tablet  Take 81 mg by mouth every other day. Takes along with Aggrenox      Yes  Historical Provider, MD   betaxolol (BETOPTIC-S) 0.25 % ophthalmic suspension  Place 1-2 drops into both eyes daily. 1 drop in both eyes in the am, 1 drop in the right eye in pm      Yes  Historical Provider, MD   Calcium Carbonate (CALCIUM 600 PO)  Take 600 mg by mouth 2 (two) times daily.      Yes  Historical Provider, MD   denosumab (XGEVA) 120 MG/1.7ML SOLN injection  Inject 120 mg into the skin once.      Yes  Historical Provider, MD  dexamethasone (DECADRON) 0.5 MG tablet  Take 1 tablet (0.5 mg total) by mouth daily with breakfast. Start Sept20th, take through Oct10th to prevent withdrawal symptoms from steroid  11/16/12    Yes  Lonie Peak, MD   dipyridamole-aspirin (AGGRENOX) 200-25 MG per 12 hr capsule  Take 1-2 capsules by mouth daily. Alternates between 1 and 2 tabs daily.      Yes  Historical Provider, MD   erlotinib (TARCEVA) 150 MG tablet  Take 1 tablet (150 mg total) by mouth daily. Take on an empty stomach 1 hour before meals or 2 hours after.  12/05/12    Yes  Si Gaul, MD   fluconazole (DIFLUCAN) 100 MG tablet  Take 1 tablet (100 mg  total) by mouth as directed. 200mg  STAT, then take 100mg  daily x 10 days.  12/19/12    Yes  Adrena E Johnson, PA-C   irbesartan (AVAPRO) 300 MG tablet  Take 300 mg by mouth daily.       Yes  Historical Provider, MD   lidocaine-prilocaine (EMLA) cream  Apply 1 application topically as needed (for port-a-cath access).   02/02/12    Yes  Si Gaul, MD   loratadine (CLARITIN) 10 MG tablet  Take 10 mg by mouth daily.      Yes  Historical Provider, MD   LORazepam (ATIVAN) 1 MG tablet  Take 1 tablet (1 mg total) by mouth at bedtime as needed for anxiety.  11/01/12    Yes  Dorothea Ogle, MD   morphine (MS CONTIN) 15 MG 12 hr tablet  Take 15 mg by mouth 2 (two) times daily as needed for pain.      Yes  Historical Provider, MD   Multiple Vitamin (MULTIVITAMIN WITH MINERALS) TABS tablet  Take 1 tablet by mouth at bedtime.      Yes  Historical Provider, MD   omeprazole (PRILOSEC) 20 MG capsule  Take 20 mg by mouth daily. Per Dr. Virgel Manifold      Yes  Historical Provider, MD   oxyCODONE (OXY IR/ROXICODONE) 5 MG immediate release tablet  Take 1 tablet (5 mg total) by mouth every 4 (four) hours as needed for pain (May take 1-2 tablets every 4 hours as needed for pain.). May take 1-2 tablets every 4 hours as needed for  pain  12/23/12    Yes  Si Gaul, MD   Probiotic Product (PROBIOTIC DAILY PO)  Take 1 tablet by mouth daily.      Yes  Historical Provider, MD   prochlorperazine (COMPAZINE) 10 MG tablet  Take 10 mg by mouth every 6 (six) hours as needed (for nausea).   02/02/12    Yes  Si Gaul, MD   rosuvastatin (CRESTOR) 10 MG tablet  Take 10 mg by mouth daily.  12/15/12    Yes  Peter M [place], MD   sennosides-docusate sodium (SENOKOT-S) 8.6-50 MG tablet  Take 1 tablet by mouth daily.      Yes  Historical Provider, MD   tiotropium (SPIRIVA) 18 MCG inhalation capsule  Place 18 mcg into inhaler and inhale daily.        Yes  Historical Provider, MD      Physical Exam: Filed Vitals:     December 29, 2012 1503    BP:  118/54   Pulse:  109   Temp:  98.6 F (37 C)   Resp:  19      Constitutional: Vital signs reviewed.  Patient is a well-developed and well-nourished  in no  acute distress and cooperative with exam. Alert and oriented x3.   Head: Normocephalic and atraumatic Mouth: no erythema or exudates, MMM Eyes: PERRL, EOMI, conjunctivae normal, No scleral icterus.  Neck: Supple, Trachea midline normal ROM, No JVD, mass, thyromegaly, or carotid bruit present.  Cardiovascular: RRR,+2 systolic murmur present, S1 normal, S2 normal, no MRG, pulses symmetric and intact bilaterally Pulmonary/Chest: moderate air mov't, CTAB, no wheezes, rales, or rhonchi Abdominal: Soft. Non-tender, non-distended, bowel sounds are normal, no masses, organomegaly, or guarding present.   GU: no CVA tenderness Extremities: No cyanosis and no edema Neurological: A&O x3, RLEStrength- foot 0/5 and rest of leg3-4/5, RUE and L. Side 5/5  bilaterally, cranial nerve II-XII are grossly intact, sensory intact to light touch bilaterally.  Skin: Warm, dry and intact. No rash, cyanosis, or clubbing.  Psychiatric: Normal mood and affect. speech and behavior is normal.         Labs on Admission:  Basic Metabolic Panel: Recent Labs Lab  12/30/2012 1345   NA  135   K  4.2   CL  104   CO2  22   GLUCOSE  112*   BUN  15   CREATININE  0.93   CALCIUM  11.3*    Liver Function Tests: Recent Labs Lab  12/20/2012 1345   AST  46*   ALT  26   ALKPHOS  117   BILITOT  0.6   PROT  6.0   ALBUMIN  2.4*    No results found for this basename: LIPASE, AMYLASE,  in the last 168 hours No results found for this basename: AMMONIA,  in the last 168 hours CBC: Recent Labs Lab  12/13/2012 1345   WBC  9.2   NEUTROABS  7.3   HGB  8.7*   HCT  27.8*   MCV  102.2*   PLT  197    Cardiac Enzymes: No results found for this basename: CKTOTAL, CKMB, CKMBINDEX, TROPONINI,  in the last 168 hours   BNP (last 3 results) No results found for  this basename: PROBNP,  in the last 8760 hours CBG: No results found for this basename: GLUCAP,  in the last 168 hours   Radiological Exams on Admission: Ct Head Wo Contrast   12/15/2012   CLINICAL DATA:  Headache. Intracranial metastatic disease. Lung cancer.  EXAM: CT HEAD WITHOUT CONTRAST  TECHNIQUE: Contiguous axial images were obtained from the base of the skull through the vertex without intravenous contrast.  COMPARISON:  12/22/2012  FINDINGS: Centrally necrotic left frontal lobe mass noted with surrounding vasogenic edema, overall not significantly changed in appearance of the past 4 days. No new hemorrhage observed. The brainstem, cerebellum, thalami, and basal ganglia appear unremarkable. No hydrocephalus or brain herniation. No intracranial hemorrhage or acute CVA.  Atherosclerotic calcification of the carotid siphons.  IMPRESSION: 1. Overall stable appearance of left for frontal lobe mass with surrounding vasogenic edema. No new hemorrhage or significant change.   Electronically Signed   By: Herbie Baltimore M.D.   On: 12/25/2012 14:41         Assessment/Plan Present on Admission:   . Right leg weakness -As discussed above, upper vs lower motor neuron -CT head with vasogenic edema, will continue with IV decadron q6 -appreciate neuro assistance, will obtain MRI of lumbar as well as of brain, follow and further manage accordingly -I have consulted rad onc, discussed pt wth Dr Michell Heinrich and they will review the pending MRI and follow up and see pt in am .  Hypercalcemia -secondary to malignancy/bone mets -IVF, steroids for now -follow recheck in am and further manage accordingly . Bone metastases/Lung cancer -will add Dr Arbutus Ped to treatment team on epic, follow and call consult as appropriate -rad onc consulted as above . Hypertension -continue outpt meds . GERD (gastroesophageal reflux disease) -place on PPI . COPD (chronic obstructive pulmonary disease) -Stable, continue  outpt meds, add prn nebs . Anemia of chronic disease -stable, monitor and further manage as appropriate           Code Status: full Family Communication: daughter  Disposition Plan: admit to tele   Time spent: >34mins   Kela Millin [facility] Pager [phone]   If 7PM-7AM, please contact night-coverage [email or web address] Password [facility] 12/22/2012, 4:42 PM               Revision History...     Date/Time User Action   12/16/2012 9:28 PM Kela Millin, MD Addend   12/08/2012 9:01 PM Kaelene Elliston Joselyn Glassman, MD Sign  View Details Report

## 2012-12-27 NOTE — Progress Notes (Signed)
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 Veristrat test Good.  PRIOR THERAPY:  1) Status post palliative radiotherapy to the right hip and sacrum under the care of Dr. Basilio Cairo.  2) Systemic chemotherapy with carboplatin for AUC of 6 on day 1 and Abraxane 100 mg/M2 on days 1, 8 and 15 every 3 weeks. The patient is status post 5 cycles.  3) Gemcitabine 1000 mg/M2 on days 1 and 8 every 3 weeks. Status post 3 cycles, last dose 10/03/2012, discontinued today secondary to disease progression.  4) stereotactic radiotherapy to multiple brain lesions under the care of Dr. Basilio Cairo completed on 10/21/2012.  5) Docetaxel 75 mg/M2 every 3 weeks with Neulasta support. First dose today 10/24/2012. Discontinued secondary to intolerance and persistent thrombocytopenia.  CURRENT THERAPY:  1) Tarceva 150 mg by mouth daily  2) Xgeva 120 mg subcutaneously every 4 weeks. Status post fiirst dose given on 08/02/2012.  CHEMOTHERAPY INTENT: Palliative  CURRENT # OF CHEMOTHERAPY CYCLES: 1  CURRENT ANTIEMETICS: Zofran, dexamethasone and Compazine  CURRENT SMOKING STATUS: Former smoker quit 01/15/2012  ORAL CHEMOTHERAPY AND CONSENT: Tarceva and consent was obtained today.  CURRENT BISPHOSPHONATES USE: Xgeva  PAIN MANAGEMENT: 0/10  NARCOTICS INDUCED CONSTIPATION: None  LIVING WILL AND CODE STATUS: Has living will. Initial full code but not prolonged resuscitation.  Subjective: The patient is seen and examined. His wife was at the bedside. He was admitted with weakness of the lower extremities right more than left. He denied having any other significant complaints. He completed 2 weeks' treatment with Tarceva with no significant adverse effects. He has no rash or diarrhea. He denied having any significant nausea or vomiting , no fever or chills. MRI of the lumbar spine yesterday showed Pathologic burst fracture at L3 with some  retropulsion of bone and extensive epidural tumor extending into the lateral recess and foramina bilaterally, right greater than left. Moderate to severe right lateral recess and foraminal stenosis at L3-4. Extensive retroperitoneal lymphadenopathy. Mild left foraminal narrowing at L3-4. Extra medullary tumor extends into the psoas muscles bilaterally, right greater than left. Heterogeneous cystic lesions are present within the sacrum and right iliac bone. These likely represent metastases as well.  Objective: Vital signs in last 24 hours: Temp:  [97.5 F (36.4 C)-98.6 F (37 C)] 97.5 F (36.4 C) (10/21 0525) Pulse Rate:  [103-113] 103 (10/21 0525) Resp:  [18-19] 18 (10/21 0525) BP: (117-125)/(54-65) 117/64 mmHg (10/21 0525) SpO2:  [92 %-98 %] 96 % (10/21 0525) Weight:  [145 lb 8.1 oz (66 kg)] 145 lb 8.1 oz (66 kg) (10/20 1712)  Intake/Output from previous day: 10/20 0701 - 10/21 0700 In: 454.2 [I.V.:454.2] Out: 301 [Urine:301] Intake/Output this shift:    General appearance: alert, cooperative, fatigued and no distress Resp: clear to auscultation bilaterally Cardio: regular rate and rhythm, S1, S2 normal, no murmur, click, rub or gallop GI: soft, non-tender; bowel sounds normal; no masses,  no organomegaly Extremities: extremities normal, atraumatic, no cyanosis or edema Neurological exam showed 2/5 muscle strength in the right lower extremity especially with the plantar flexion of the right foot.  Lab Results:   Recent Labs  12/30/2012 1345 12/27/12 0645  WBC 9.2 7.2  HGB 8.7* 9.1*  HCT 27.8* 28.2*  PLT 197 198   BMET  Recent Labs  12/25/2012 1345 12/27/12 0645  NA 135 136  K 4.2 4.4  CL 104 106  CO2 22 22  GLUCOSE 112* 155*  BUN 15 16  CREATININE 0.93 0.86  CALCIUM 11.3* 11.0*    Studies/Results: Dg Chest 2 View  12/09/2012   CLINICAL DATA:  Stroke symptoms. History of hypertension. History of lung cancer.  EXAM: CHEST  2 VIEW  COMPARISON:  12/04/2012   FINDINGS: Ovoid mass lesion in the medial aspect of the right upper lung adjacent to the paratracheal stripe. Mass is measured at approximately 5.4 x 8.5 cm. There is suggestion of mild effacement of the trachea. The mass measures larger than on the previous study, but some of this may be due to differences in technique. Stable location of right central venous catheter. Normal heart size and pulmonary vascularity. Emphysematous changes in the lungs. No blunting of costophrenic angles. No pneumothorax. No developing airspace disease. Old fracture deformity of the right clavicle. Degenerative changes in the spine.  IMPRESSION: Right upper lung mass measures larger than on previous study, possibly due to differences in technique. Emphysematous changes. No developing consolidation.   Electronically Signed   By: Burman Nieves M.D.   On: 12/22/2012 21:22   Ct Head Wo Contrast  12/21/2012   CLINICAL DATA:  Headache. Intracranial metastatic disease. Lung cancer.  EXAM: CT HEAD WITHOUT CONTRAST  TECHNIQUE: Contiguous axial images were obtained from the base of the skull through the vertex without intravenous contrast.  COMPARISON:  12/22/2012  FINDINGS: Centrally necrotic left frontal lobe mass noted with surrounding vasogenic edema, overall not significantly changed in appearance of the past 4 days. No new hemorrhage observed. The brainstem, cerebellum, thalami, and basal ganglia appear unremarkable. No hydrocephalus or brain herniation. No intracranial hemorrhage or acute CVA.  Atherosclerotic calcification of the carotid siphons.  IMPRESSION: 1. Overall stable appearance of left for frontal lobe mass with surrounding vasogenic edema. No new hemorrhage or significant change.   Electronically Signed   By: Herbie Baltimore M.D.   On: 12/07/2012 14:41   Mr Lumbar Spine W Wo Contrast  12/20/2012   CLINICAL DATA:  End stage IV non-small cell lung cancer with no metastasis. Right lower extremity pain and weakness.   EXAM: MRI LUMBAR SPINE WITHOUT AND WITH CONTRAST  TECHNIQUE: Multiplanar and multiecho pulse sequences of the lumbar spine were obtained without and with intravenous contrast.  CONTRAST:  13mL MULTIHANCE GADOBENATE DIMEGLUMINE 529 MG/ML IV SOLN  COMPARISON:  CT of the abdomen and pelvis with contrast 12/22/2012  FINDINGS: Normal signal is present in the conus medullaris which terminates the at L1, within normal limits. A pathologic L3 compression fracture is present. The right peritracheal lymph node below the screws the right hemidiaphragm measures 9 mm in short axis. Pericaval lymph nodes fresh that multiple aortocaval lymph nodes are present. The largest is just anterior to the L2-3 disc level, measuring 19 x 17 mm on the axial images. A retro aortic lymph node at L4-5 and the measures 10 x 21 mm. The tumor within the L3 vertebral body extends the on the margins an into the adjacent psoas muscles, right greater than left. The several caudad extent of tumor is greater than 4 point 8 cm.  Extensive cystic tumor and pathologic fracture is present within the sacrum at the level of S2, right greater than left. A cystic lesion is present within the right iliac bone as well.  The disc levels at L1-2 and above are normal.  L2-3: Slight disc bulging is present. There is no significant stenosis.  L3-4: Extramedullary tumor extends into the lateral recess and foramina bilaterally, right greater than left. Severe right  foraminal in lateral recess narrowing is evident. More mild disease is present on the left.  L4-5: Mild disk bulging and facet hypertrophy is present. Mild foraminal narrowing is noted.  L5-S1: Mild facet hypertrophy is present. There is no significant stenosis.  IMPRESSION: 1. Pathologic burst fracture at L3 with some retropulsion of bone and extensive epidural tumor extending into the lateral recess and foramina bilaterally, right greater than left. 2. Moderate to severe right lateral recess and foraminal  stenosis at L3-4. 3. Extensive retroperitoneal lymphadenopathy. 4. Mild left foraminal narrowing at L3-4. 5. Extra medullary tumor extends into the psoas muscles bilaterally, right greater than left. 6. Heterogeneous cystic lesions are present within the sacrum and right iliac bone. These likely represent metastases as well. 7. Multiple cysts are present within the kidneys bilaterally.   Electronically Signed   By: Gennette Pac M.D.   On: 27-Dec-2012 19:00    Medications: I have reviewed the patient's current medications.  Assessment/Plan: This is a very pleasant 79 years old white male with metastatic non-small cell lung cancer with recent disease progression and currently on treatment with Tarceva 150 mg by mouth daily status post 2 weeks of treatment and tolerating it fairly well. The patient presented with weakness in the lower extremities and MRI of the lumbar spine showed evidence for disease progression at the L3 with pathologic fracture and extensive epidural tumor extending into the lateral recess and for him in the bilaterally. I discussed the case with Dr. Basilio Cairo. And the patient may benefit from palliative radiotherapy or surgical decompression of this area. She would see the patient later today for evaluation and recommendation regarding the palliative approach. For now the patient will continue his treatment with Tarceva. I will arrange a followup appointment for him with me after discharge for close monitoring of his disease and treatment. Thank you for taking good care of Mr. Hillyard , I will continue to follow up the patient with you and assist in his management.   LOS: 1 day    Renley Gutman K. 12/27/2012

## 2012-12-27 NOTE — Evaluation (Signed)
Speech Language Pathology Evaluation Patient Details Name: Joel Gibson MRN: [id number] DOB: [birth year] Today's Date: 12/27/2012 Time: 7829-5621 SLP Time Calculation (min): 27 min  Problem List:  Patient Active Problem List   Diagnosis Date Noted  . Acute lumbar radiculopathy 12/27/2012  . Metastasis from malignant tumor of lung 12/27/2012  . Right leg weakness 01/02/2013  . Hypercalcemia 01/02/2013  . Oral thrush 10/29/2012  . Acute pharyngitis 10/29/2012  . Antineoplastic chemotherapy induced pancytopenia 10/29/2012  . Anemia of chronic disease 10/29/2012  . Brain metastases 10/12/2012  . COPD (chronic obstructive pulmonary disease)   . Hepatitis   . GERD (gastroesophageal reflux disease)   . Bone metastases   . Lung cancer 02/02/2012  . Right Upper Lobe Mass 3.3cm 01/12/2012  . Hyperlipidemia   . Hypertension   . Aortic stenosis   . Tobacco abuse   . Stroke    Past Medical History:  Past Medical History  Diagnosis Date  . Hyperlipidemia   . Hypertension   . Aortic stenosis     mild to moderate  . Gastritis   . COPD (chronic obstructive pulmonary disease)   . Bronchitis   . Hepatitis     possible  . Hx of cataract surgery     lt eye  . Tobacco abuse   . Shortness of breath     with activity  . Retinal vein occlusion 1990  . GERD (gastroesophageal reflux disease)   . Hemorrhoid   . Aortic stenosis   . Hypertension   . Dyslipidemia   . Glaucoma   . Right rib fracture      Pathological Fracture / 4 weeks ago  . Lung mass     Right Upper Lobe - Lobular Mass  . SOB (shortness of breath)     Mild  . Cough   . Stroke 2002    Right slight difference in vision  . S/P radiation therapy 02/22/12 - 03/07/12    Pelvis/Sacrum/30 Gray/10 Fractions and Left Scapula/Rib/ 8 Gray/1 Fraction   . Status post chemotherapy      Carboplatin/ Abraxane  . Status post chemotherapy Stopped 10/03/12    Gemcitabine  - Progressed  . Lung cancer 02/02/2012  . Bone metastases      Widespread/ Lytic Lesion Left 7t Rib, Left Iliac Pelvis, ribs and scapula  . S/P radiation therapy 10/21/12    SRS Brain / Left Frontal / 20 Gy  . Status post chemotherapy     Docetaxel with Neulasta Support - Stopped due to Intolerance and Persistent Thrombocytopenia   . On antineoplastic chemotherapy     Tarceva daily / Xgeva Q 4 weeks  . Right sided weakness 01/05/2013  . Hx of fall 12/18/2012  . Health care home, active care coordination 12/12/2012   Past Surgical History:  Past Surgical History  Procedure Laterality Date  . Vasectomy    . Tonsillectomy    . Leg surgery      rt for nerve impingement  . Collarbone fracture      1977  . Tonsillectomy    . Eye surgery      catarct bil  . Portacath placement  02/08/2012    Procedure: INSERTION PORT-A-CATH;  Surgeon: [name], MD;  Location: [facility] OR;  Service: Thoracic;  Laterality: Right;  . Ct guided core biopsy  01/26/12    Left Lytic Lesion - Metastatic Squamous Cell Carcinoma  . Skin biopsy  02/09/12    Right Upper Back, shave : Melanoma In Situ  Arising  From a Dysplastic Nevus   HPI:  79 yo male adm to [facility] after unwitnessed fall at home due to right sided weakness.  Pt has h/o Stage IV lung cancer with bone/brain mets.  PMH + for arthritis, HTN, COPD, GERD, CVA, bronchitis. CT head 10-20 showed stable left frontal mass with vasogenic edema.  CXR 10-20 showed RUL mass larger, ? due to different technique in xray.  Pt has h/o oral thrush and has been treated with difulan, magic mouthwash.  He denies dysphagia  and admits to issues with thoughts "trailing off."  Order for SLE received due to neurological symptoms.    Assessment / Plan / Recommendation Clinical Impression  Pt presents with mild dysfluency resulting in thoughts "trailing off" -per pt- during verbal expression suspected due to vasogenic edema.  He is able to communicate basic needs without difficulties.  SLP provided pt and spouse with compensation strategies and  word finding exercises to maximize semantic fund.  Pt is aware of his physical impairment and need for assistance.    Xerostomia reported for which SLP provided compensation strategies.  He does require further SLP services as he has support needed at home.  Wife manages home environment including paying bills, medication and appts.  Hopeful for improved function with decrease in edema.     SLP to sign off as all education is completed, pt and spouse agreeable.     SLP Assessment  Patient does not need any further Speech Lanaguage Pathology Services    Follow Up Recommendations  None    Frequency and Duration   n/a     Pertinent Vitals/Pain Afebrile, decreased   SLP Goals   n/a  SLP Evaluation Prior Functioning  Cognitive/Linguistic Baseline: Baseline deficits (problems with losing trail of thought recently)  Lives With: Spouse Available Help at Discharge: Family Education:  (worked as Hydrologist) Vocation: Retired   Engineer, building services Level: Oriented X4 Attention: Sustained Sustained Attention: Appears intact Memory: Appears intact (to medical events, premorbid hx) Awareness: Appears intact (awareness to fall risk d/t weakness) Problem Solving: Appears intact Safety/Judgment: Appears intact    Comprehension  Auditory Comprehension Overall Auditory Comprehension: Appears within functional limits for tasks assessed Yes/No Questions: Not tested Commands: Within Functional Limits Conversation: Complex Visual Recognition/Discrimination Discrimination: Not tested Reading Comprehension Reading Status:  (read calendar needed cues to use glasses)    Expression Expression Primary Mode of Expression: Verbal Verbal Expression Overall Verbal Expression: Appears within functional limits for tasks assessed Initiation: No impairment Level of Generative/Spontaneous Verbalization: Conversation Naming: No impairment (11 animals named in 60 seconds) Pragmatics: No  impairment Written Expression Dominant Hand: Right Written Expression: Not tested   Oral / Motor Oral Motor/Sensory Function Overall Oral Motor/Sensory Function: Appears within functional limits for tasks assessed Motor Speech Overall Motor Speech: Appears within functional limits for tasks assessed   GO     Mills Koller, MS [facility] SLP [phone]

## 2012-12-27 NOTE — Care Management Note (Addendum)
    Page 1 of 1   12/30/2012     11:11:15 AM   CARE MANAGEMENT NOTE 12/30/2012  Patient:  Joel Gibson, Joel Gibson   Account Number:  [id number]  Date Initiated:  12/27/2012  Documentation initiated by:  [facility]  Subjective/Objective Assessment:   79 Y/O M ADMITTED W/R LEG WEAKNESS.ZO:XWRU CA     Action/Plan:   FROM HOME W/SPOUSE.HAS PCP,PHARMACY.   Anticipated DC Date:  01/02/2013   Anticipated DC Plan:  SKILLED NURSING FACILITY      DC Planning Services  CM consult      Choice offered to / List presented to:             Status of service:  In process, will continue to follow Medicare Important Message given?   (If response is "NO", the following Medicare IM given date fields will be blank) Date Medicare IM given:   Date Additional Medicare IM given:    Discharge Disposition:    Per UR Regulation:  Reviewed for med. necessity/level of care/duration of stay  If discussed at Long Length of Stay Meetings, dates discussed:    Comments:  12/30/12 Aleksey Newbern RN,BSN NCM [phone] TRANSFER TO SDU-SOB,TACHYCARDIA,CT-PARATRACHEAL MASS-FOR CHEST TUBE.D/C PLAN SNF WHEN MED STABLE.  12/27/12 Demeisha Geraghty RN,BSN NCM [phone] NEURO-SIGNED OFF.RAD ONC-SIMULATION XRT.MRI-EPIDURAL TUMOR.ST-IMPROVED FUNCTION W/DECREASE EDEMA.PT-SNF.

## 2012-12-27 NOTE — Evaluation (Signed)
Physical Therapy Evaluation Patient Details Name: Joel Gibson MRN: [id number] DOB: [birth year] Today's Date: 12/27/2012 Time: 1220-1248 PT Time Calculation (min): 28 min  PT Assessment / Plan / Recommendation History of Present Illness  Pt is a 78 y.o. male with multiple medical problems as listed below including h/o Lung ca diagnosed in 2013 (and followed by Dr Arbutus Ped) metastatic to brain and and bones and s/p stereotactic radiotherapy (followed by Dr Basilio Cairo), CVA, HTN, AS who presents with the above complaints. He states that he developed pain from his hip area down to knee and R. Leg weakness 3-4days ago and it has worsened to where he is unable to move that leg. Per daughter pt had unwitnessed fall(although wife denies) prior to the worsening of these symptoms and that he has had some weakness for the past 1mos. Pt denies visual changes, dysphagia, slurred speech and no parasthesias reported.He was seen in the ED and head CT showed- stable appearance of left for frontal lobe mass with surrounding vasogenic edema and no new hemorrhage. Neuro was consulted and iv decadron recommended per Dr Roseanne Reno and admission to [facility] for further eval and management.  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 Pt currently presenting with functional limitations due to deficits listed below (see PT Problem List).  Plan is for radiation treatment at this time. Pt able to perform bed mobility and sit<>stand transfer with min-mod assist but limited in participation due to R knee/hip pain and fatigue.  Pt's wife states she would like pt to return home but she is scheduled for a hospital procedure and would need pt to be more independent if he goes home. Therefore, PT recommends SNF as wife will be unavailable to assist due to procedure and family in the process of building handrails so pt can traverse stair to enter home, pt may be able to d/c home with  HHPT if family available to manage pt at home. Pt would benefit from skilled PT to increase independence and safety during mobility to allow d/c to venue below.    PT Assessment  Patient needs continued PT services    Follow Up Recommendations  SNF;Supervision/Assistance - 24 hour    Does the patient have the potential to tolerate intense rehabilitation      Barriers to Discharge        Equipment Recommendations  Other (comment) (TBD: possibly RW)    Recommendations for Other Services     Frequency Min 3X/week    Precautions / Restrictions Precautions Precautions: Fall;Back Precaution Comments: adhere to back precautions (log rolling, etc) for safety secondary to lumbar spine tumor Restrictions Weight Bearing Restrictions: No   Pertinent Vitals/Pain No c/o pain at rest with 6-7/10 R knee/hip pain while standing, pain decreased upon sitting. Pt positioned to comfort at end of session and RN informed of pt's request for pain meds.     Mobility  Bed Mobility Bed Mobility: Rolling Left;Left Sidelying to Sit;Sitting - Scoot to [place] of Bed Rolling Left: 3: Mod assist;With rail Left Sidelying to Sit: 3: Mod assist;HOB elevated Sitting - Scoot to Edge of Bed: 4: Min assist Details for Bed Mobility Assistance: PT attempted to teach pt log roll technique due to lumbar spine tumor, pt had difficutly following commands so will re-educate next visit.  Mod A also required due to decreased strength and fatigue.  Transfers Transfers: Sit to Stand;Stand to Sit Sit to Stand: 3: Mod assist;From bed;With upper  extremity assist Stand to Sit: 4: Min assist;With upper extremity assist;To bed Details for Transfer Assistance: assist required due to decreased RLE strength and VC's for hand placement. Pt able to march in place (x2) with RW and min A for safety. Ambulation/Gait Ambulation/Gait Assistance: Not tested (comment) Ambulation/Gait Assistance Details: not assessed secondary to RLE weakness  and pt reported 6-7/10 R knee/hip pain during standing. Stairs: No    Exercises     PT Diagnosis: Difficulty walking;Generalized weakness  PT Problem List: Decreased strength;Decreased range of motion;Decreased activity tolerance;Decreased balance;Decreased mobility;Decreased knowledge of use of DME;Pain PT Treatment Interventions: DME instruction;Gait training;Stair training;Functional mobility training;Therapeutic activities;Therapeutic exercise;Balance training;Neuromuscular re-education;Patient/family education     PT Goals(Current goals can be found in the care plan section) Acute Rehab PT Goals Patient Stated Goal: to get pt independent in transfers as wife unable to assist pt at current level of function. PT Goal Formulation: With patient/family Time For Goal Achievement: 01/10/13 Potential to Achieve Goals: Good  Visit Information  Last PT Received On: 12/27/12 Assistance Needed: +2 (for stand pivot transfers and to attempt ambulation ) History of Present Illness: Pt is a 79 y.o. male with multiple medical problems as listed below including h/o Lung ca diagnosed in 2013 (and followed by Dr Arbutus Ped) metastatic to brain and and bones and s/p stereotactic radiotherapy (followed by Dr Basilio Cairo), CVA, HTN, AS who presents with the above complaints. He states that he developed pain from his hip area down to knee and R. Leg weakness 3-4days ago and it has worsened to where he is unable to move that leg. Per daughter pt had unwitnessed fall(although wife denies) prior to the worsening of these symptoms and that he has had some weakness for the past 1mos. Pt denies visual changes, dysphagia, slurred speech and no parasthesias reported.He was seen in the ED and head CT showed- stable appearance of left for frontal lobe mass with surrounding vasogenic edema and no new hemorrhage. Neuro was consulted and iv decadron recommended per Dr Roseanne Reno and admission to [facility] for further eval and management.        Prior Functioning  Home Living Family/patient expects to be discharged to:: Private residence Living Arrangements: Spouse/significant other Available Help at Discharge: Available 24 hours/day;Family Type of Home: House Home Access: Stairs to enter [employer] of Steps: 3 Entrance Stairs-Rails: None (in the process of building handrails) Home Layout: Two level;Able to live on main level with bedroom/bathroom Alternate Level Stairs-Number of Steps: one flight Alternate Level Stairs-Rails: Right Home Equipment: Walker - 4 wheels;Cane - single point;Grab bars - toilet;Grab bars - tub/shower;Bedside commode;Other (comment) (elevated toilet seat)  Lives With: Spouse Prior Function Level of Independence: Needs assistance Comments: pt's wife report pt requires assistance for all ADLs and transfers/ambulation prior to hospitalization. Communication Communication: HOH (pt has hearing aids, but not wearing them in hospital) Dominant Hand: Right    Cognition  Cognition Arousal/Alertness: Awake/alert Behavior During Therapy: WFL for tasks assessed/performed Overall Cognitive Status: History of cognitive impairments - at baseline (history of brain CA and pt's wife had to assist pt during history due to decr. memory) Memory: Decreased short-term memory    Extremity/Trunk Assessment Lower Extremity Assessment Lower Extremity Assessment: RLE deficits/detail RLE Deficits / Details: MMT: hip flex: 2/5, knee ext: 2/5, knee flex: 4/5, DF: 2/5, PF: 1/5. Pt c/o numbness and tingling starting at knee and moving distally. No c/o decreased sensation.    Balance Balance Balance Assessed: Yes Dynamic Standing Balance Dynamic Standing - Balance Support:  Bilateral upper extremity supported Dynamic Standing - Level of Assistance: 4: Min assist Dynamic Standing - Balance Activities: Other (comment) (marching in place ) Dynamic Standing - Comments: pt able to march in place for approx. 30 seconds  with bil UE support and min A for safety. pt reported 6-7/10 R knee/hip pain and fatigue during marching and required seated rest break.  End of Session PT - End of Session Equipment Utilized During Treatment: Gait belt Activity Tolerance: Patient limited by fatigue;Patient limited by pain Patient left: in bed;with family/visitor present;with call bell/phone within reach;with nursing/sitter in room Nurse Communication: Patient requests pain meds;Mobility status (+2 assist for transfers)  GP     Sol Blazing 12/27/2012, 1:52 PM

## 2012-12-28 ENCOUNTER — Encounter: Payer: Self-pay | Admitting: Radiation Oncology

## 2012-12-28 ENCOUNTER — Ambulatory Visit
Admit: 2012-12-28 | Discharge: 2012-12-28 | Disposition: A | Discharge status: Home / Self Care | Payer: Medicare Other | Attending: Radiation Oncology | Admitting: Radiation Oncology

## 2012-12-28 ENCOUNTER — Ambulatory Visit: Payer: Medicare Other

## 2012-12-28 DIAGNOSIS — T451X5A Adverse effect of antineoplastic and immunosuppressive drugs, initial encounter: Secondary | ICD-10-CM

## 2012-12-28 DIAGNOSIS — D61810 Antineoplastic chemotherapy induced pancytopenia: Secondary | ICD-10-CM

## 2012-12-28 LAB — BASIC METABOLIC PANEL
BUN: 21 mg/dL (ref 6–23)
CO2: 21 mEq/L (ref 19–32)
Calcium: 10.5 mg/dL (ref 8.4–10.5)
Chloride: 106 mEq/L (ref 96–112)
Glucose, Bld: 133 mg/dL — ABNORMAL HIGH (ref 70–99)
Sodium: 136 mEq/L (ref 135–145)

## 2012-12-28 MED ORDER — POLYETHYLENE GLYCOL 3350 17 G PO PACK
17.0000 g | PACK | Freq: Every day | ORAL | Status: DC | PRN
  Filled 2012-12-28: qty 1

## 2012-12-28 MED ORDER — ERLOTINIB HCL 150 MG PO TABS
150.0000 mg | ORAL_TABLET | Freq: Every day | ORAL | Status: DC
  Administered 2012-12-29 – 2013-01-07 (×10): 150 mg via ORAL
  Filled 2012-12-28: qty 1

## 2012-12-28 MED ORDER — SENNOSIDES-DOCUSATE SODIUM 8.6-50 MG PO TABS
1.0000 | ORAL_TABLET | Freq: Two times a day (BID) | ORAL | Status: DC
  Administered 2012-12-28 – 2013-01-01 (×7): 1 via ORAL
  Filled 2012-12-28 (×12): qty 1

## 2012-12-28 MED ORDER — ERLOTINIB HCL 150 MG PO TABS: 150.0000 mg | ORAL_TABLET | Freq: Every day | ORAL | Status: DC

## 2012-12-28 MED ORDER — DEXAMETHASONE 4 MG PO TABS
4.0000 mg | ORAL_TABLET | Freq: Three times a day (TID) | ORAL | Status: DC
  Administered 2012-12-28 – 2013-01-09 (×36): 4 mg via ORAL
  Filled 2012-12-28 (×39): qty 1

## 2012-12-28 NOTE — Evaluation (Signed)
Occupational Therapy Evaluation Patient Details Name: Joel Gibson MRN: [id number] DOB: 1931-12-30 Today's Date: 12/28/2012 Time: 4098-1191 OT Time Calculation (min): 26 min  OT Assessment / Plan / Recommendation History of present illness Pt is a 79 y.o. male with multiple medical problems including h/o Lung ca diagnosed in 2013 (and followed by Dr Joel Gibson) metastatic to brain and and bones and s/p stereotactic radiotherapy (followed by Dr Joel Gibson), CVA, HTN, He states that he developed pain from his hip area down to knee and R. Leg weakness 3-4days prior to admission and it has worsened to where he is unable to move that leg. Per daughter he has had some weakness for the past 53mo   head CT showed- stable appearance of left for frontal lobe mass with surrounding vasogenic edema and no new hemorrhage.Pt has a L3 burst fx (no brace needed per MD) and tumor which is being treated by xrt.     Clinical Impression   Pt was admitted for the above.  He is appropriate for skilled OT to increase safety and independence for mobility related to adls.  See problem list and goals below    OT Assessment  Patient needs continued OT Services    Follow Up Recommendations  SNF    Barriers to Discharge      Equipment Recommendations   (pt has shower seat and 3:1)    Recommendations for Other Services    Frequency  Min 2X/week    Precautions / Restrictions Precautions Precautions: Fall;Back Precaution Comments: adhere to back precautions (log rolling, etc) for safety secondary to lumbar spine tumor Restrictions Weight Bearing Restrictions: No   Pertinent Vitals/Pain 2-3/10 R hip to knee    ADL  Lower Body Dressing: Performed;+2 Total assistance (depends pad) Lower Body Dressing: Patient Percentage: 0% Where Assessed - Lower Body Dressing: Supported sit to stand Toilet Transfer: Simulated;+2 Total assistance Toilet Transfer: Patient Percentage: 70% Toilet Transfer Method: Stand pivot Equipment  Used: Rolling walker Transfers/Ambulation Related to ADLs: pt performed spt from chair to bed.  Cued for back precautions.  Pt able to advance RLE with extra time.  Assistance as pt leans more to R than centered ADL Comments: Pt is able is perform UB adls.  Wife has been helping with LB for months due to progressive weakness    OT Diagnosis: Generalized weakness  OT Problem List: Decreased strength;Decreased activity tolerance;Impaired balance (sitting and/or standing);Decreased cognition;Pain OT Treatment Interventions: Self-care/ADL training;Balance training;Patient/family education;Cognitive remediation/compensation;DME and/or AE instruction   OT Goals(Current goals can be found in the care plan section) Acute Rehab OT Goals Patient Stated Goal: to keep improving OT Goal Formulation: With patient Time For Goal Achievement: Feb 05, 2013 Potential to Achieve Goals: Good ADL Goals Pt Will Transfer to Toilet: with min assist;stand pivot transfer;bedside commode Additional ADL Goal #1: pt will maintain static standing balance with rw for 2 minutes for adls with min guard A Additional ADL Goal #2: Pt will perform sit to stand and vice versa with mod A, following back precautions, in prep for 3:1 transfers  Visit Information         Prior Functioning     Home Living Family/patient expects to be discharged to:: Private residence Living Arrangements: Spouse/significant other Home Equipment: Environmental consultant - 4 wheels;Cane - single point;Grab bars - toilet;Grab bars - tub/shower;Bedside commode;Other (comment);Shower seat  Lives With: Spouse Prior Function Level of Independence: Needs assistance Comments: pt's wife report pt requires assistance for all ADLs and transfers/ambulation prior to hospitalization. Communication Communication: No difficulties  Vision/Perception     Cognition  Cognition Arousal/Alertness: Awake/alert Behavior During Therapy: WFL for tasks  assessed/performed Overall Cognitive Status: History of cognitive impairments - at baseline Memory: Decreased short-term memory  Pt uses the wrong words at times and loses place in conversation   Extremity/Trunk Assessment Upper Extremity Assessment Upper Extremity Assessment:  (AROM WFLs to 90; did not MMT 2* back pain)     Mobility Bed Mobility Sit to Supine: 1: +2 Total assist Sit to Supine: Patient Percentage: 50% Details for Bed Mobility Assistance: assist to keep trunk from twisting and assist with legs Transfers Sit to Stand: 4: Min assist;From chair/3-in-1;With upper extremity assist Details for Transfer Assistance: assist to rise and steady     Exercise     Balance Dynamic Standing Balance Dynamic Standing - Balance Support: Bilateral upper extremity supported Dynamic Standing - Level of Assistance: 4: Min assist Dynamic Standing - Comments: stood one minute; leans slightly to R   End of Session OT - End of Session Activity Tolerance: Patient tolerated treatment well Patient left: in bed;with call bell/phone within reach;with family/visitor present  GO     Joel Gibson 12/28/2012, 4:11 PM Joel Gibson, OTR/L [phone] 12/28/2012

## 2012-12-28 NOTE — Progress Notes (Signed)
[facility] Radiation Oncology Dept Therapy Treatment Record Phone [phone]   Radiation Therapy was administered to Joel Gibson on: 12/28/2012  12:10 PM and was treatment # 1out of a planned course of 10 treatments.       [facility] Radiation Oncology Dept Therapy Treatment Record Phone [phone]   Radiation Therapy was administered to Joel Gibson on: 01/02/2013  10:54 AM and was treatment # 4 out of a planned course of 10 treatments.

## 2012-12-28 NOTE — Progress Notes (Signed)
[facility] PROGRESS NOTE  Joel Gibson ZOX:[id number] DOB: Oct 30, 1931 DOA: 29-Dec-2012 PCP: [name], MD  Assessment/Plan: . Right leg weakness/Pain-secondary to L3 Pathologic burst fracture with some retropulsion of bone and extensive epidural tumor as per MRI -continue decadron, some improvement overnight, follo  -Rad Onc/Dr Basilio Cairo following, s/p Simulation yesterday to start XRT today for 10treatments  -CT head with vasogenic edema, will continue with decadron q6, change to PO -PT/OT  Following, SNF recommended  . Hypercalcemia  -secondary to malignancy/bone mets  -trending down on IVF, steroids   -normal today  . Extensive Bone metastases/Stage 4 non small cell Lung cancer  - Dr Arbutus Ped and Rad Onc following - prognosis appears rather poor - on TArceva  . Hypertension  -continue outpt meds   . GERD (gastroesophageal reflux disease)  -place on PPI   . COPD (chronic obstructive pulmonary disease)  -Stable, continue outpt meds, add prn nebs   . Anemia of chronic disease  -stable, monitor and further manage as appropriate   Code Status: full Family Communication: wife at bedside  Disposition Plan: SNF ? tomorrow   Consultants:  Neuro  Radiation -onc  onc  Procedures:  none  Antibiotics:  none  HPI/Subjective: States able to move R. Leg better today  Objective: Filed Vitals:   12/28/12 0654  BP: 151/82  Pulse: 110  Temp: 97.7 F (36.5 C)  Resp: 16    Intake/Output Summary (Last 24 hours) at 12/28/12 1216 Last data filed at 12/28/12 0710  Gross per 24 hour  Intake 2342.5 ml  Output     90 ml  Net 2252.5 ml   Filed Weights   Dec 29, 2012 1712  Weight: 66 kg (145 lb 8.1 oz)    Exam:  General: alert & oriented x 3 In NAD Cardiovascular: RRR, nl S1 s2 Respiratory: decreased BS at bases, no crackles or wheezes Abdomen: soft +BS NT/ND, no masses palpable Extremities: No cyanosis and no edema, RLE foot strength 2-3/5 and rest of  R.Leg 4-5/5, L side strength 5/5     Data Reviewed: Basic Metabolic Panel:  Recent Labs Lab 12/29/2012 1345 12/27/12 0645 12/28/12 0516  NA 135 136 136  K 4.2 4.4 4.1  CL 104 106 106  CO2 22 22 21   GLUCOSE 112* 155* 133*  BUN 15 16 21   CREATININE 0.93 0.86 0.87  CALCIUM 11.3* 11.0* 10.5   Liver Function Tests:  Recent Labs Lab December 29, 2012 1345  AST 46*  ALT 26  ALKPHOS 117  BILITOT 0.6  PROT 6.0  ALBUMIN 2.4*   No results found for this basename: LIPASE, AMYLASE,  in the last 168 hours No results found for this basename: AMMONIA,  in the last 168 hours CBC:  Recent Labs Lab December 29, 2012 1345 12/27/12 0645  WBC 9.2 7.2  NEUTROABS 7.3  --   HGB 8.7* 9.1*  HCT 27.8* 28.2*  MCV 102.2* 101.4*  PLT 197 198   Cardiac Enzymes: No results found for this basename: CKTOTAL, CKMB, CKMBINDEX, TROPONINI,  in the last 168 hours BNP (last 3 results) No results found for this basename: PROBNP,  in the last 8760 hours CBG: No results found for this basename: GLUCAP,  in the last 168 hours  No results found for this or any previous visit (from the past 240 hour(s)).   Studies: Dg Chest 2 View  29-Dec-2012   CLINICAL DATA:  Stroke symptoms. History of hypertension. History of lung cancer.  EXAM: CHEST  2 VIEW  COMPARISON:  12/04/2012  FINDINGS: Ovoid mass lesion in the medial aspect of the right upper lung adjacent to the paratracheal stripe. Mass is measured at approximately 5.4 x 8.5 cm. There is suggestion of mild effacement of the trachea. The mass measures larger than on the previous study, but some of this may be due to differences in technique. Stable location of right central venous catheter. Normal heart size and pulmonary vascularity. Emphysematous changes in the lungs. No blunting of costophrenic angles. No pneumothorax. No developing airspace disease. Old fracture deformity of the right clavicle. Degenerative changes in the spine.  IMPRESSION: Right upper lung mass measures  larger than on previous study, possibly due to differences in technique. Emphysematous changes. No developing consolidation.   Electronically Signed   By: Burman Nieves M.D.   On: 12-28-12 21:22   Ct Head Wo Contrast  28-Dec-2012   CLINICAL DATA:  Headache. Intracranial metastatic disease. Lung cancer.  EXAM: CT HEAD WITHOUT CONTRAST  TECHNIQUE: Contiguous axial images were obtained from the base of the skull through the vertex without intravenous contrast.  COMPARISON:  12/22/2012  FINDINGS: Centrally necrotic left frontal lobe mass noted with surrounding vasogenic edema, overall not significantly changed in appearance of the past 4 days. No new hemorrhage observed. The brainstem, cerebellum, thalami, and basal ganglia appear unremarkable. No hydrocephalus or brain herniation. No intracranial hemorrhage or acute CVA.  Atherosclerotic calcification of the carotid siphons.  IMPRESSION: 1. Overall stable appearance of left for frontal lobe mass with surrounding vasogenic edema. No new hemorrhage or significant change.   Electronically Signed   By: Herbie Baltimore M.D.   On: 12-28-12 14:41   Mr Lumbar Spine W Wo Contrast  2012/12/28   CLINICAL DATA:  End stage IV non-small cell lung cancer with no metastasis. Right lower extremity pain and weakness.  EXAM: MRI LUMBAR SPINE WITHOUT AND WITH CONTRAST  TECHNIQUE: Multiplanar and multiecho pulse sequences of the lumbar spine were obtained without and with intravenous contrast.  CONTRAST:  13mL MULTIHANCE GADOBENATE DIMEGLUMINE 529 MG/ML IV SOLN  COMPARISON:  CT of the abdomen and pelvis with contrast 12/22/2012  FINDINGS: Normal signal is present in the conus medullaris which terminates the at L1, within normal limits. A pathologic L3 compression fracture is present. The right peritracheal lymph node below the screws the right hemidiaphragm measures 9 mm in short axis. Pericaval lymph nodes fresh that multiple aortocaval lymph nodes are present. The largest  is just anterior to the L2-3 disc level, measuring 19 x 17 mm on the axial images. A retro aortic lymph node at L4-5 and the measures 10 x 21 mm. The tumor within the L3 vertebral body extends the on the margins an into the adjacent psoas muscles, right greater than left. The several caudad extent of tumor is greater than 4 point 8 cm.  Extensive cystic tumor and pathologic fracture is present within the sacrum at the level of S2, right greater than left. A cystic lesion is present within the right iliac bone as well.  The disc levels at L1-2 and above are normal.  L2-3: Slight disc bulging is present. There is no significant stenosis.  L3-4: Extramedullary tumor extends into the lateral recess and foramina bilaterally, right greater than left. Severe right foraminal in lateral recess narrowing is evident. More mild disease is present on the left.  L4-5: Mild disk bulging and facet hypertrophy is present. Mild foraminal narrowing is noted.  L5-S1: Mild facet hypertrophy is present. There is no significant stenosis.  IMPRESSION: 1. Pathologic  burst fracture at L3 with some retropulsion of bone and extensive epidural tumor extending into the lateral recess and foramina bilaterally, right greater than left. 2. Moderate to severe right lateral recess and foraminal stenosis at L3-4. 3. Extensive retroperitoneal lymphadenopathy. 4. Mild left foraminal narrowing at L3-4. 5. Extra medullary tumor extends into the psoas muscles bilaterally, right greater than left. 6. Heterogeneous cystic lesions are present within the sacrum and right iliac bone. These likely represent metastases as well. 7. Multiple cysts are present within the kidneys bilaterally.   Electronically Signed   By: Gennette Pac M.D.   On: 2013/01/20 19:00    Scheduled Meds: . amLODipine  2.5 mg Oral Daily  . aspirin EC  81 mg Oral QODAY  . atorvastatin  20 mg Oral q1800  . betaxolol  1 drop Both Eyes Daily  . betaxolol  1 drop Right Eye QHS  .  dexamethasone  4 mg Intravenous Q6H  . dipyridamole-aspirin  1 capsule Oral QHS  . dipyridamole-aspirin  1 capsule Oral QODAY  . enoxaparin (LOVENOX) injection  40 mg Subcutaneous Q24H  . fluconazole  100 mg Oral Daily  . irbesartan  300 mg Oral Daily  . loratadine  10 mg Oral Daily  . morphine  15 mg Oral Q12H  . multivitamin with minerals  1 tablet Oral QHS  . pantoprazole  40 mg Oral BID AC  . senna-docusate  1 tablet Oral Daily  . tiotropium  18 mcg Inhalation Daily   Continuous Infusions: . sodium chloride 1,000 mL (12/27/12 2049)    Active Problems:   Hypertension   Lung cancer   COPD (chronic obstructive pulmonary disease)   GERD (gastroesophageal reflux disease)   Bone metastases   Anemia of chronic disease   Right leg weakness   Hypercalcemia   Acute lumbar radiculopathy   Metastasis from malignant tumor of lung    Time spent: 25    [facility]  [facility] Pager [phone]. If 7PM-7AM, please contact night-coverage at [email or web address], password [email or web address] 12/28/2012, 12:16 PM  LOS: 2 days

## 2012-12-29 ENCOUNTER — Ambulatory Visit: Payer: Medicare Other

## 2012-12-29 ENCOUNTER — Other Ambulatory Visit: Payer: Medicare Other | Admitting: Lab

## 2012-12-29 ENCOUNTER — Ambulatory Visit: Payer: Medicare Other | Admitting: Physician Assistant

## 2012-12-29 ENCOUNTER — Inpatient Hospital Stay (HOSPITAL_COMMUNITY): Payer: Medicare Other

## 2012-12-29 ENCOUNTER — Ambulatory Visit
Admit: 2012-12-29 | Discharge: 2012-12-29 | Disposition: A | Discharge status: Home / Self Care | Payer: Medicare Other | Attending: Radiation Oncology | Admitting: Radiation Oncology

## 2012-12-29 LAB — CBC
HCT: 27.6 % — ABNORMAL LOW (ref 39.0–52.0)
MCH: 32.1 pg (ref 26.0–34.0)
MCV: 99.6 fL (ref 78.0–100.0)
Platelets: 198 10*3/uL (ref 150–400)
RBC: 2.77 MIL/uL — ABNORMAL LOW (ref 4.22–5.81)
WBC: 11.4 10*3/uL — ABNORMAL HIGH (ref 4.0–10.5)

## 2012-12-29 LAB — COMPREHENSIVE METABOLIC PANEL
ALT: 40 U/L (ref 0–53)
AST: 48 U/L — ABNORMAL HIGH (ref 0–37)
Alkaline Phosphatase: 125 U/L — ABNORMAL HIGH (ref 39–117)
BUN: 19 mg/dL (ref 6–23)
CO2: 19 mEq/L (ref 19–32)
Calcium: 10 mg/dL (ref 8.4–10.5)
Chloride: 108 mEq/L (ref 96–112)
Creatinine, Ser: 0.8 mg/dL (ref 0.50–1.35)
GFR calc Af Amer: >90 mL/min (ref >90)
GFR calc non Af Amer: 83 mL/min — ABNORMAL LOW (ref >90)
Glucose, Bld: 115 mg/dL — ABNORMAL HIGH (ref 70–99)
Potassium: 4.2 mEq/L (ref 3.5–5.1)
Total Bilirubin: 0.4 mg/dL (ref 0.3–1.2)

## 2012-12-29 NOTE — Progress Notes (Signed)
Physical Therapy Treatment Patient Details Name: LLEWYN HEAP MRN: [id number] DOB: [birth year] Today's Date: 12/29/2012 Time: 5784-6962 PT Time Calculation (min): 32 min  PT Assessment / Plan / Recommendation  History of Present Illness Pt is a 79 y.o. male with multiple medical problems including h/o Lung ca diagnosed in 2013 (and followed by Dr Arbutus Ped) metastatic to brain and and bones and s/p stereotactic radiotherapy (followed by Dr Basilio Cairo), CVA, HTN, He states that he developed pain from his hip area down to knee and R. Leg weakness 3-4days prior to admission and it has worsened to where he is unable to move that leg. Per daughter he has had some weakness for the past 32mo   head CT showed- stable appearance of left for frontal lobe mass with surrounding vasogenic edema and no new hemorrhage.Pt has a L3 burst fx (no brace needed per MD) and tumor which is being treated by xrt.   PT Comments   Pt demonstrating progress as he was able to sidestep along bedside and march in place with RW and min assist to min guard. Pt limited by SOB and increase HR (133 bpm) during standing. PT spoke with pt, pt's wife, and pt's daughter about d/c facility as pt's family asked RN about inpatient rehab earlier in the afternoon. However, after pt's family observed pt's PT session they agreed that pt would benefit from SNF vs CIR, as pt's activity tolerance is limited by fatigue and increased HR. PT re-assessed R dorsiflexion: 3/5 and R plantarflexion: 2/5 (both improved from evaluation).  Pt would continue to benefit from skilled PT in order to improve endurance, strength, and safety.  Follow Up Recommendations  SNF;Supervision/Assistance - 24 hour     Does the patient have the potential to tolerate intense rehabilitation     Barriers to Discharge        Equipment Recommendations  Rolling walker with 5" wheels    Recommendations for Other Services    Frequency Min 3X/week   Progress towards PT Goals  Progress towards PT goals: Progressing toward goals  Plan Current plan remains appropriate    Precautions / Restrictions Precautions Precautions: Fall;Back Precaution Comments: adhere to back precautions (log rolling, etc) for safety secondary to lumbar spine tumor Restrictions Weight Bearing Restrictions: No   Pertinent Vitals/Pain No c/o pain during session. Pt on room air during entire session. SaO2 at rest and HR: 93-95% and 60-70 bpm; SaO2 and HR during standing: 98% and 133 bpm (pt took seated rest break and SaO2 98% and HR: 92 bpm); SaO2 and HR after marching while standing: 98% and 122 bpm (pt took seated rest break and then assisted back into bed). Pt's wife gave pt rescue inhaler when pt c/o SOB, PT encouraged pt to perform pursed lip breathing to assist SOB.    Mobility  Bed Mobility Bed Mobility: Rolling Left;Left Sidelying to Sit;Sitting - Scoot to [place] of Bed;Sit to Sidelying Left Rolling Left: 4: Min assist Left Sidelying to Sit: 4: Min assist;HOB elevated Sitting - Scoot to [place] of Bed: 4: Min assist Sit to Sidelying Left: 3: Mod assist Details for Bed Mobility Assistance: min assist due to weakness and to ensure safety during rolling to L side and  L sidelying to sit. VC's for log rolling technique. Mod A during sit to L sidelying/supine to assist bil LEs onto bed due to fatigue. Transfers Transfers: Sit to Stand;Stand to Sit Sit to Stand: 4: Min assist;With upper extremity assist;From bed Stand to Sit: 4: Min assist;To  chair/3-in-1;With upper extremity assist;To bed Details for Transfer Assistance: min assist during first sit<>stand to rise and steady and to improve eccentric control, with pt progressing to min guard during second and third sit<>stand. VC's for hand placement. SaO2 and HR monitored during session, please see vitals section for details Ambulation/Gait Ambulation/Gait Assistance: 4: Min assist Ambulation Distance (Feet): 10 Feet Assistive device: Rolling  walker Ambulation/Gait Assistance Details: pt able to perform lateral steps (2x5') with RW and min assist to min guard due to R knee occassionally buckling during SLS, with one seated rest break due to SOB and increased HR (133 bpm). pt's wife offered pt his rescue inhaler and pt reports SOB decreased after using inhaler.    Exercises General Exercises - Lower Extremity Ankle Circles/Pumps: AROM;20 reps;Supine;Both Hip Flexion/Marching: AROM;20 reps;Standing;Both   PT Diagnosis:    PT Problem List:   PT Treatment Interventions:     PT Goals (current goals can now be found in the care plan section)    Visit Information  Last PT Received On: 12/29/12 Assistance Needed: +1 History of Present Illness: Pt is a 79 y.o. male with multiple medical problems including h/o Lung ca diagnosed in 2013 (and followed by Dr Arbutus Ped) metastatic to brain and and bones and s/p stereotactic radiotherapy (followed by Dr Basilio Cairo), CVA, HTN, He states that he developed pain from his hip area down to knee and R. Leg weakness 3-4days prior to admission and it has worsened to where he is unable to move that leg. Per daughter he has had some weakness for the past 66mo   head CT showed- stable appearance of left for frontal lobe mass with surrounding vasogenic edema and no new hemorrhage.Pt has a L3 burst fx (no brace needed per MD) and tumor which is being treated by xrt.    Subjective Data      Cognition  Cognition Arousal/Alertness: Awake/alert Overall Cognitive Status: History of cognitive impairments - at baseline Memory: Decreased short-term memory    Balance     End of Session PT - End of Session Equipment Utilized During Treatment: Gait belt Activity Tolerance: Patient limited by fatigue Patient left: in bed;with family/visitor present;with call bell/phone within reach Nurse Communication: Mobility status;Other (comment) (RN notified of pt's SaO2 and increased HR during standing.)   GP     Sol Blazing 12/29/2012, 4:12 PM

## 2012-12-29 NOTE — Addendum Note (Signed)
Encounter addended by: Delynn Flavin, RN on: 12/29/2012  6:37 PM<BR>     Documentation filed: Charges VN

## 2012-12-29 NOTE — Clinical Social Work Psychosocial (Signed)
      Clinical Social Work Department BRIEF PSYCHOSOCIAL ASSESSMENT 12/29/2012  Patient:  JAMEAL, RAZZANO     Account Number:  [id number]     Admit date:  12/12/2012  Clinical Social Worker:  Hattie Perch  Date/Time:  12/29/2012 12:00 M  Referred by:  Physician  Date Referred:  12/29/2012 Referred for  SNF Placement   Other Referral:   Interview type:  Patient Other interview type:   spouse at bedside    PSYCHOSOCIAL DATA Living Status:  FAMILY Admitted from facility:   Level of care:   Primary support name:  Nadav Swindell Primary support relationship to patient:  SPOUSE Degree of support available:   excellent    CURRENT CONCERNS Current Concerns  Post-Acute Placement   Other Concerns:    SOCIAL WORK ASSESSMENT / PLAN CSW met with patient and patient's spouse at bedside. patient is alert and oriented X3. patient is pleasant and defers to spouse. patient's spouse is very anxious. discussed need for snf. she is agreeable but states that it will have to be a place where she can spend the night so she can care for him at night. CSW explained barriers of this (possibly having a shared room, snf rules, etc). she is understanding. requesting patient to be faxed out.   Assessment/plan status:   Other assessment/ plan:   Information/referral to community resources:    PATIENTS/FAMILYS RESPONSE TO PLAN OF CARE: patient is calm and cooperative regarding need for snf placement and being faxed out. patient spouse is extremely anxious and concerned about where patient might go.

## 2012-12-29 NOTE — Progress Notes (Signed)
Pt's wife has pt's Tarceva at bedside. Informed pt and wife that hospital policy is to have pharmacy check any home meds. Pt and wife decline to provide Tarceva to hospital pharmacy.  Wife states " This is a very valuable medication and I need to keep it in my own hands."  Witnessed wife giving med to pt this AM.

## 2012-12-29 NOTE — Progress Notes (Signed)
[facility] PROGRESS NOTE  Joel Gibson ZOX:[id number] DOB: 04-04-1931 DOA: 2013-01-12 PCP: [name], MD  Assessment/Plan: . Right leg weakness/Pain-secondary to L3 Pathologic burst fracture with some retropulsion of bone and extensive epidural tumor as per MRI -continue decadron, starting to improve with Steroids -Rad Onc/Dr Basilio Cairo following, started XRT10/22 for 10treatments total -CT head with vasogenic edema, will continue with decadron q6, change to PO -PT/OT  Following, SNF recommended -CSW consult  . Hypercalcemia  -secondary to malignancy/bone mets  -trending down on IVF, steroids   -normal today  . Extensive Bone metastases/Stage 4 non small cell Lung cancer  - Dr Arbutus Ped and Rad Onc following - prognosis appears rather poor - on TArceva  . Hypertension  -continue outpt meds   . GERD (gastroesophageal reflux disease)  -place on PPI   . COPD (chronic obstructive pulmonary disease)  -Stable, continue outpt meds, add prn nebs   . Anemia of chronic disease  -stable, monitor and further manage as appropriate   Code Status: full Family Communication: wife at bedside  Disposition Plan: SNF  tomorrow   Consultants:  Neuro  Radiation -onc  onc  Procedures:  none  Antibiotics:  none  HPI/Subjective: States able to move R. Leg and arm better today  Objective: Filed Vitals:   12/29/12 1003  BP: 132/64  Pulse:   Temp:   Resp:     Intake/Output Summary (Last 24 hours) at 12/29/12 1147 Last data filed at 12/29/12 8119  Gross per 24 hour  Intake 2021.25 ml  Output    125 ml  Net 1896.25 ml   Filed Weights   January 12, 2013 1712  Weight: 66 kg (145 lb 8.1 oz)    Exam:  General: alert & oriented x 3 In NAD Cardiovascular: RRR, nl S1 s2 Respiratory: decreased BS at bases, no crackles or wheezes Abdomen: soft +BS NT/ND, no masses palpable Extremities: No cyanosis and no edema, RLE foot strength 3/5 and rest of R.Leg 4-5/5, L side  strength 5/5     Data Reviewed: Basic Metabolic Panel:  Recent Labs Lab 01/12/2013 1345 12/27/12 0645 12/28/12 0516 12/29/12 0530  NA 135 136 136 137  K 4.2 4.4 4.1 4.2  CL 104 106 106 108  CO2 22 22 21 19   GLUCOSE 112* 155* 133* 115*  BUN 15 16 21 19   CREATININE 0.93 0.86 0.87 0.80  CALCIUM 11.3* 11.0* 10.5 10.0   Liver Function Tests:  Recent Labs Lab Jan 12, 2013 1345 12/29/12 0530  AST 46* 48*  ALT 26 40  ALKPHOS 117 125*  BILITOT 0.6 0.4  PROT 6.0 5.5*  ALBUMIN 2.4* 2.3*   No results found for this basename: LIPASE, AMYLASE,  in the last 168 hours No results found for this basename: AMMONIA,  in the last 168 hours CBC:  Recent Labs Lab 12-Jan-2013 1345 12/27/12 0645 12/29/12 0530  WBC 9.2 7.2 11.4*  NEUTROABS 7.3  --   --   HGB 8.7* 9.1* 8.9*  HCT 27.8* 28.2* 27.6*  MCV 102.2* 101.4* 99.6  PLT 197 198 198   Cardiac Enzymes: No results found for this basename: CKTOTAL, CKMB, CKMBINDEX, TROPONINI,  in the last 168 hours BNP (last 3 results) No results found for this basename: PROBNP,  in the last 8760 hours CBG: No results found for this basename: GLUCAP,  in the last 168 hours  No results found for this or any previous visit (from the past 240 hour(s)).   Studies: No results found.  Scheduled Meds: . amLODipine  2.5 mg Oral Daily  . aspirin EC  81 mg Oral QODAY  . atorvastatin  20 mg Oral q1800  . betaxolol  1 drop Both Eyes Daily  . betaxolol  1 drop Right Eye QHS  . dexamethasone  4 mg Oral Q8H  . dipyridamole-aspirin  1 capsule Oral QHS  . dipyridamole-aspirin  1 capsule Oral QODAY  . enoxaparin (LOVENOX) injection  40 mg Subcutaneous Q24H  . erlotinib  150 mg Oral Daily  . irbesartan  300 mg Oral Daily  . loratadine  10 mg Oral Daily  . morphine  15 mg Oral Q12H  . multivitamin with minerals  1 tablet Oral QHS  . pantoprazole  40 mg Oral BID AC  . senna-docusate  1 tablet Oral BID  . tiotropium  18 mcg Inhalation Daily   Continuous  Infusions: . sodium chloride 75 mL/hr at 12/28/12 2144    Active Problems:   Hypertension   Lung cancer   COPD (chronic obstructive pulmonary disease)   GERD (gastroesophageal reflux disease)   Bone metastases   Anemia of chronic disease   Right leg weakness   Hypercalcemia   Acute lumbar radiculopathy   Metastasis from malignant tumor of lung    Time spent: 25    [facility]  [facility] Pager [phone]. If 7PM-7AM, please contact night-coverage at [email or web address], password [facility] 12/29/2012, 11:47 AM  LOS: 3 days

## 2012-12-29 NOTE — Progress Notes (Signed)
Per PT pt was tachycardic up to 130s and had some SOB with his tx today. Pt now ST 100s w/ no SOB/dsypnea resting comfortably in bed. Dr Jomarie Longs made aware and orders for cxr and to d/c ivf noted and implemented.

## 2012-12-29 NOTE — Progress Notes (Signed)
I have reviewed this note and agree with all findings. Kati Tinea Nobile, PT, DPT Pager: [phone]   

## 2012-12-30 ENCOUNTER — Inpatient Hospital Stay (HOSPITAL_COMMUNITY): Payer: Medicare Other

## 2012-12-30 ENCOUNTER — Encounter (HOSPITAL_COMMUNITY): Payer: Self-pay

## 2012-12-30 ENCOUNTER — Ambulatory Visit
Admit: 2012-12-30 | Discharge: 2012-12-30 | Disposition: A | Discharge status: Home / Self Care | Payer: Medicare Other | Attending: Radiation Oncology | Admitting: Radiation Oncology

## 2012-12-30 ENCOUNTER — Encounter: Payer: Self-pay | Admitting: Internal Medicine

## 2012-12-30 ENCOUNTER — Ambulatory Visit: Payer: Medicare Other

## 2012-12-30 DIAGNOSIS — R0603 Acute respiratory distress: Secondary | ICD-10-CM

## 2012-12-30 DIAGNOSIS — J984 Other disorders of lung: Secondary | ICD-10-CM

## 2012-12-30 DIAGNOSIS — J948 Other specified pleural conditions: Secondary | ICD-10-CM

## 2012-12-30 DIAGNOSIS — J90 Pleural effusion, not elsewhere classified: Secondary | ICD-10-CM

## 2012-12-30 DIAGNOSIS — Z4682 Encounter for fitting and adjustment of non-vascular catheter: Secondary | ICD-10-CM

## 2012-12-30 LAB — MRSA PCR SCREENING: MRSA by PCR: NEGATIVE

## 2012-12-30 LAB — PROCALCITONIN: Procalcitonin: 0.12 ng/mL

## 2012-12-30 MED ORDER — PIPERACILLIN-TAZOBACTAM 3.375 G IVPB
3.3750 g | Freq: Three times a day (TID) | INTRAVENOUS | Status: DC
  Administered 2012-12-30 – 2013-01-05 (×20): 3.375 g via INTRAVENOUS
  Filled 2012-12-30 (×23): qty 50

## 2012-12-30 MED ORDER — IOHEXOL 350 MG/ML SOLN
80.0000 mL | Freq: Once | INTRAVENOUS | Status: AC | PRN
  Administered 2012-12-30: 09:00:00 80 mL via INTRAVENOUS

## 2012-12-30 MED ORDER — FENTANYL CITRATE 0.05 MG/ML IJ SOLN
INTRAMUSCULAR | Status: AC
  Administered 2012-12-30: 50 ug
  Filled 2012-12-30: qty 2

## 2012-12-30 MED ORDER — HYDROMORPHONE HCL PF 1 MG/ML IJ SOLN
INTRAMUSCULAR | Status: AC
  Filled 2012-12-30: qty 1

## 2012-12-30 MED ORDER — HYDROMORPHONE HCL PF 1 MG/ML IJ SOLN
0.5000 mg | INTRAMUSCULAR | Status: DC | PRN
  Administered 2012-12-30 – 2013-01-08 (×17): 1 mg via INTRAVENOUS
  Filled 2012-12-30 (×16): qty 1

## 2012-12-30 MED ORDER — LEVALBUTEROL HCL 0.63 MG/3ML IN NEBU
0.6300 mg | INHALATION_SOLUTION | Freq: Four times a day (QID) | RESPIRATORY_TRACT | Status: DC
  Administered 2012-12-30 – 2013-01-09 (×30): 0.63 mg via RESPIRATORY_TRACT
  Filled 2012-12-30 (×64): qty 3

## 2012-12-30 NOTE — Progress Notes (Signed)
Put long term disability forms on nurse's desk.

## 2012-12-30 NOTE — Progress Notes (Addendum)
[facility] PROGRESS NOTE  Joel Gibson ZOX:[id number] DOB: 19-Nov-1931 DOA: 07-Jan-2013 PCP: [name], MD  Assessment/Plan:  Dyspnea/Tachycardia -will get STAT CTA to r/o PE -has some wheezing too, xopenex nebs now  L3 Pathologic burst fracture with some retropulsion of bone and extensive epidural tumor as per MRI -Right leg weakness/Pain -continue decadron, starting to improve with Steroids -Rad Onc/Dr Basilio Cairo following, started XRT10/22 for 10treatments total -CT head with vasogenic edema, will continue with decadron q6, change to PO -PT/OT  Following, SNF recommended  Dyspnea/Tachycardia -will get STAT CTA to r/o PE -has some wheezing too, xopenex nebs now  . Hypercalcemia  -secondary to malignancy/bone mets  -trending down on IVF, steroids   -normal today  . Extensive Bone metastases/Stage 4 non small cell Lung cancer  - Dr Arbutus Ped and Rad Onc following - prognosis appears rather poor - on TArceva  . Hypertension  -continue outpt meds   . GERD (gastroesophageal reflux disease)  -place on PPI   . COPD (chronic obstructive pulmonary disease)  - continue outpt meds, add prn nebs   . Anemia of chronic disease  -stable, monitor and further manage as appropriate   Code Status: full Family Communication: wife at bedside  Disposition Plan: SNF  When stable   Consultants:  Neuro  Radiation -onc  onc  Procedures:  none  Antibiotics:  none  HPI/Subjective: States able to move R. Leg and arm better today  Objective: Filed Vitals:   12/30/12 0553  BP: 143/64  Pulse: 106  Temp: 97.2 F (36.2 C)  Resp: 20    Intake/Output Summary (Last 24 hours) at 12/30/12 0820 Last data filed at 12/30/12 0553  Gross per 24 hour  Intake 1462.5 ml  Output    950 ml  Net  512.5 ml   Filed Weights   2013/01/07 1712  Weight: 66 kg (145 lb 8.1 oz)    Exam:  General: alert & oriented x 3, mild distress Cardiovascular: RRR, nl S1 s2 Respiratory:  decreased BS at bases, some wheezes in R lung and ronchi Abdomen: soft +BS NT/ND, no masses palpable Extremities: No cyanosis and no edema, RLE foot strength 3/5 and rest of R.Leg 4-5/5, L side strength 5/5     Data Reviewed: Basic Metabolic Panel:  Recent Labs Lab 01/07/2013 1345 12/27/12 0645 12/28/12 0516 12/29/12 0530  NA 135 136 136 137  K 4.2 4.4 4.1 4.2  CL 104 106 106 108  CO2 22 22 21 19   GLUCOSE 112* 155* 133* 115*  BUN 15 16 21 19   CREATININE 0.93 0.86 0.87 0.80  CALCIUM 11.3* 11.0* 10.5 10.0   Liver Function Tests:  Recent Labs Lab 2013/01/07 1345 12/29/12 0530  AST 46* 48*  ALT 26 40  ALKPHOS 117 125*  BILITOT 0.6 0.4  PROT 6.0 5.5*  ALBUMIN 2.4* 2.3*   No results found for this basename: LIPASE, AMYLASE,  in the last 168 hours No results found for this basename: AMMONIA,  in the last 168 hours CBC:  Recent Labs Lab 01-07-2013 1345 12/27/12 0645 12/29/12 0530  WBC 9.2 7.2 11.4*  NEUTROABS 7.3  --   --   HGB 8.7* 9.1* 8.9*  HCT 27.8* 28.2* 27.6*  MCV 102.2* 101.4* 99.6  PLT 197 198 198   Cardiac Enzymes: No results found for this basename: CKTOTAL, CKMB, CKMBINDEX, TROPONINI,  in the last 168 hours BNP (last 3 results) No results found for this basename: PROBNP,  in the last 8760 hours CBG: No results  found for this basename: GLUCAP,  in the last 168 hours  No results found for this or any previous visit (from the past 240 hour(s)).   Studies: Dg Chest 2 View  12/29/2012   CLINICAL DATA:  Rapid respiration  EXAM: CHEST  2 VIEW  COMPARISON:  12/17/2012  FINDINGS: Cardiomediastinal silhouette is stable. Again noted right upper lobe medial paratracheal mass. There is streaky airspace is in right upper lobe just lateral to the mass. Postobstructive pneumonia cannot be excluded. Clinical correlation is necessary. Stable right Port-A-Cath position. Osteopenia and degenerative changes thoracic spine.  IMPRESSION: Again noted right upper lobe medial  paratracheal mass. There is streaky airspace is in right upper lobe just lateral to the mass. Postobstructive pneumonia cannot be excluded. Clinical correlation is necessary.   Electronically Signed   By: Natasha Mead M.D.   On: 12/29/2012 15:37    Scheduled Meds: . amLODipine  2.5 mg Oral Daily  . aspirin EC  81 mg Oral QODAY  . atorvastatin  20 mg Oral q1800  . betaxolol  1 drop Both Eyes Daily  . betaxolol  1 drop Right Eye QHS  . dexamethasone  4 mg Oral Q8H  . dipyridamole-aspirin  1 capsule Oral QHS  . dipyridamole-aspirin  1 capsule Oral QODAY  . enoxaparin (LOVENOX) injection  40 mg Subcutaneous Q24H  . erlotinib  150 mg Oral Daily  . irbesartan  300 mg Oral Daily  . loratadine  10 mg Oral Daily  . morphine  15 mg Oral Q12H  . multivitamin with minerals  1 tablet Oral QHS  . pantoprazole  40 mg Oral BID AC  . senna-docusate  1 tablet Oral BID  . tiotropium  18 mcg Inhalation Daily   Continuous Infusions:    Active Problems:   Hypertension   Lung cancer   COPD (chronic obstructive pulmonary disease)   GERD (gastroesophageal reflux disease)   Bone metastases   Anemia of chronic disease   Right leg weakness   Hypercalcemia   Acute lumbar radiculopathy   Metastasis from malignant tumor of lung    Time spent: 25    [facility]  [facility] Pager [phone]. If 7PM-7AM, please contact night-coverage at [email or web address], password [facility] 12/30/2012, 8:20 AM  LOS: 4 days

## 2012-12-30 NOTE — Consult Note (Signed)
PULMONARY  / CRITICAL CARE MEDICINE  Name: Joel Gibson MRN: [id number] DOB: June 15, 1931    ADMISSION DATE:  Jan 23, 2013 CONSULTATION DATE:  10/24  REFERRING MD :  Jomarie Longs  PRIMARY SERVICE:   [facility]   CHIEF COMPLAINT:   Hydropneumothorax   BRIEF PATIENT DESCRIPTION:  79 year old male w/ NSCLC stage IV (mets to brain, spine and pelvis. Admitted on 10/21 with new right LE weakness. Found to have new pathological lumbar fracture by MRI. Began palliative XRT on 10/22. Moved to SDU on 10/24 after developing spontaneous right pneumothorax/ hydropneumothorax.   SIGNIFICANT EVENTS / STUDIES:  MR spine 10/20: 1. Pathologic burst fracture at L3 with some retropulsion of bone and extensive epidural tumor extending into the lateral recess and foramina bilaterally, right greater than left.2. Moderate to severe right lateral recess and foraminal stenosis at L3-4.3. Extensive retroperitoneal lymphadenopathy.4. Mild left foraminal narrowing at L3-4. 5. Extra medullary tumor extends into the psoas muscles bilaterally,right greater than left. 6. Heterogeneous cystic lesions are present within the sacrum and right iliac bone. These likely represent metastases as well.7. Multiple cysts are present within the kidneys bilaterally.  CT chest 10/24: 1. Limited evaluation for pulmonary embolism demonstrating no  evidence of central, lobar or proximal segmental sized pulmonary embolism at this time.  2. Progression of disease with marked enlargement of right upper lobe mass, development of right hilar and mediastinal lymphadenopathy in addition to marked left axillary and subpectoral lymphadenopathy, probable retroperitoneal lymphadenopathy, malignant  right pleural effusion with extensive right pleural nodularity, and widespread metastases throughout the liver. 3. Additional findings, as above.   LINES / TUBES: Port (chonic) Right wayne cath 10/24>>>  CULTURES:   ANTIBIOTICS: Zosyn 10/24>>>   HISTORY  OF PRESENT ILLNESS:   79 y.o. male with multiple medical problems as listed below including h/o Lung ca diagnosed in 2013 (and followed by Dr Arbutus Ped) metastatic to brain and and bones and s/p stereotactic radiotherapy (followed by Dr Basilio Cairo), CVA, HTN, AS who presented 10/21 w/ acute Right leg weakness. He states that he developed pain from his hip area down to knee and R. Leg weakness 3-4days prior and it has worsened to where he is unable to move that leg. Per daughter pt had unwitnessed fall(although wife denies) prior to the worsening of these symptoms and that he has had some weakness for the past 1mos. Pt denied visual changes, dysphagia, slurred speech and no parasthesias reported.He was seen in the ED and head CT showed- stable appearance of left for frontal lobe mass with surrounding vasogenic edema and no new hemorrhage. Neuro was consulted and iv decadron recommended per Dr Roseanne Reno and admission to [facility] for further eval and management. MRI showed pathological fracture at L3 w/ progressive metastasis. He received his first palliative XRT of the spine on 10/22. Early in the am hours of 10/24 he developed sudden increase in shortness of breath, and tachycardia. He was sent for stat CT scan to r/o PE. What was identified was a large spontaneous right hydro-pneumothorax which was the reason for pulm consult.    PAST MEDICAL HISTORY :  Past Medical History  Diagnosis Date  . Hyperlipidemia   . Hypertension   . Aortic stenosis     mild to moderate  . Gastritis   . COPD (chronic obstructive pulmonary disease)   . Bronchitis   . Hepatitis     possible  . Hx of cataract surgery     lt eye  . Tobacco abuse   .  Shortness of breath     with activity  . Retinal vein occlusion 1990  . GERD (gastroesophageal reflux disease)   . Hemorrhoid   . Aortic stenosis   . Hypertension   . Dyslipidemia   . Glaucoma   . Right rib fracture      Pathological Fracture / 4 weeks ago  . Lung mass     Right  Upper Lobe - Lobular Mass  . SOB (shortness of breath)     Mild  . Cough   . Stroke 2002    Right slight difference in vision  . S/P radiation therapy 02/22/12 - 03/07/12    Pelvis/Sacrum/30 Gray/10 Fractions and Left Scapula/Rib/ 8 Gray/1 Fraction   . Status post chemotherapy      Carboplatin/ Abraxane  . Status post chemotherapy Stopped 10/03/12    Gemcitabine  - Progressed  . Lung cancer 02/02/2012  . Bone metastases     Widespread/ Lytic Lesion Left 7t Rib, Left Iliac Pelvis, ribs and scapula  . S/P radiation therapy 10/21/12    SRS Brain / Left Frontal / 20 Gy  . Status post chemotherapy     Docetaxel with Neulasta Support - Stopped due to Intolerance and Persistent Thrombocytopenia   . On antineoplastic chemotherapy     Tarceva daily / Xgeva Q 4 weeks  . Right sided weakness 2013-01-07  . Hx of fall 2013-01-07  . Health care home, active care coordination 01-07-13   Past Surgical History  Procedure Laterality Date  . Vasectomy    . Tonsillectomy    . Leg surgery      rt for nerve impingement  . Collarbone fracture      1977  . Tonsillectomy    . Eye surgery      catarct bil  . Portacath placement  02/08/2012    Procedure: INSERTION PORT-A-CATH;  Surgeon: [name], MD;  Location: [facility] OR;  Service: Thoracic;  Laterality: Right;  . Ct guided core biopsy  01/26/12    Left Lytic Lesion - Metastatic Squamous Cell Carcinoma  . Skin biopsy  02/09/12    Right Upper Back, shave : Melanoma In Situ Arising  From a Dysplastic Nevus   Prior to Admission medications   Medication Sig Start Date End Date Taking? Authorizing Provider  albuterol (PROVENTIL HFA;VENTOLIN HFA) 108 (90 BASE) MCG/ACT inhaler Inhale 2 puffs into the lungs every 4 (four) hours as needed for wheezing or shortness of breath.    Yes Historical Provider, MD  Alum & Mag Hydroxide-Simeth (MAGIC MOUTHWASH W/LIDOCAINE) SOLN Take 5 mLs by mouth 4 (four) times daily as needed (do no swallow.  swish a spit QID prn).  12/19/12  Yes Adrena E Johnson, PA-C  amLODipine (NORVASC) 2.5 MG tablet Take 1 tablet (2.5 mg total) by mouth daily. 10/13/12  Yes Peter M [place], MD  aspirin EC 81 MG tablet Take 81 mg by mouth every other day. Takes along with Aggrenox   Yes Historical Provider, MD  betaxolol (BETOPTIC-S) 0.25 % ophthalmic suspension Place 1-2 drops into both eyes daily. 1 drop in both eyes in the am, 1 drop in the right eye in pm   Yes Historical Provider, MD  Calcium Carbonate (CALCIUM 600 PO) Take 600 mg by mouth 2 (two) times daily.   Yes Historical Provider, MD  denosumab (XGEVA) 120 MG/1.7ML SOLN injection Inject 120 mg into the skin once.   Yes Historical Provider, MD  dexamethasone (DECADRON) 0.5 MG tablet Take 1 tablet (0.5 mg  total) by mouth daily with breakfast. Start Sept20th, take through Oct10th to prevent withdrawal symptoms from steroid 11/16/12  Yes Lonie Peak, MD  dipyridamole-aspirin (AGGRENOX) 200-25 MG per 12 hr capsule Take 1-2 capsules by mouth daily. Alternates between 1 and 2 tabs daily.   Yes Historical Provider, MD  erlotinib (TARCEVA) 150 MG tablet Take 1 tablet (150 mg total) by mouth daily. Take on an empty stomach 1 hour before meals or 2 hours after. 12/05/12  Yes Si Gaul, MD  fluconazole (DIFLUCAN) 100 MG tablet Take 1 tablet (100 mg total) by mouth as directed. 200mg  STAT, then take 100mg  daily x 10 days. 12/19/12  Yes Adrena E Johnson, PA-C  irbesartan (AVAPRO) 300 MG tablet Take 300 mg by mouth daily.    Yes Historical Provider, MD  lidocaine-prilocaine (EMLA) cream Apply 1 application topically as needed (for port-a-cath access).  02/02/12  Yes Si Gaul, MD  loratadine (CLARITIN) 10 MG tablet Take 10 mg by mouth daily.   Yes Historical Provider, MD  LORazepam (ATIVAN) 1 MG tablet Take 1 tablet (1 mg total) by mouth at bedtime as needed for anxiety. 11/01/12  Yes Dorothea Ogle, MD  morphine (MS CONTIN) 15 MG 12 hr tablet Take 15 mg by mouth 2 (two) times daily as  needed for pain.   Yes Historical Provider, MD  Multiple Vitamin (MULTIVITAMIN WITH MINERALS) TABS tablet Take 1 tablet by mouth at bedtime.   Yes Historical Provider, MD  omeprazole (PRILOSEC) 20 MG capsule Take 20 mg by mouth daily. Per Dr. Virgel Manifold   Yes Historical Provider, MD  oxyCODONE (OXY IR/ROXICODONE) 5 MG immediate release tablet Take 1 tablet (5 mg total) by mouth every 4 (four) hours as needed for pain (May take 1-2 tablets every 4 hours as needed for pain.). May take 1-2 tablets every 4 hours as needed for  pain 12/23/12  Yes Si Gaul, MD  Probiotic Product (PROBIOTIC DAILY PO) Take 1 tablet by mouth daily.   Yes Historical Provider, MD  prochlorperazine (COMPAZINE) 10 MG tablet Take 10 mg by mouth every 6 (six) hours as needed (for nausea).  02/02/12  Yes Si Gaul, MD  rosuvastatin (CRESTOR) 10 MG tablet Take 10 mg by mouth daily. 12/15/12  Yes Peter M [place], MD  sennosides-docusate sodium (SENOKOT-S) 8.6-50 MG tablet Take 1 tablet by mouth daily.   Yes Historical Provider, MD  tiotropium (SPIRIVA) 18 MCG inhalation capsule Place 18 mcg into inhaler and inhale daily.     Yes Historical Provider, MD   No Known Allergies  FAMILY HISTORY:  Family History  Problem Relation Age of Onset  . Heart attack Father     x7  . Throat cancer Father     Deceased from Staph Infection  . Heart attack Father     7 Times  . Leukemia Mother    SOCIAL HISTORY:  reports that he quit smoking about a year ago. His smoking use included Cigarettes. He has a 130 pack-year smoking history. He has never used smokeless tobacco. He reports that he drinks alcohol. He reports that he does not use illicit drugs.  REVIEW OF SYSTEMS:   Constitutional: Negative for fever, chills, weight loss, malaise/fatigue and diaphoresis.  HENT: Negative for hearing loss, ear pain, nosebleeds, congestion, sore throat, neck pain, tinnitus and ear discharge.   Eyes: Negative for blurred vision, double vision,  photophobia, pain, discharge and redness.  Respiratory: Negative for cough, hemoptysis, sputum production, shortness of breath, wheezing, acutely worse this am  and stridor.   Cardiovascular: Negative for chest pain, palpitations, orthopnea, claudication, leg swelling and PND.  Gastrointestinal: Negative for heartburn, nausea, vomiting, abdominal pain, diarrhea, constipation, blood in stool and melena.  Genitourinary: Negative for dysuria, urgency, frequency, hematuria and flank pain.  Musculoskeletal: Negative for myalgias, back pain, and RLE weakness, joint pain and falls.  Skin: Negative for itching and rash.  Neurological: Negative for dizziness, tingling, tremors, sensory change, speech change, focal weakness, seizures, loss of consciousness, weakness and headaches.  Endo/Heme/Allergies: Negative for environmental allergies and polydipsia. Does not bruise/bleed easily.  SUBJECTIVE:  C/o new SOB VITAL SIGNS: Temp:  [97.2 F (36.2 C)-98.3 F (36.8 C)] 97.2 F (36.2 C) (10/24 0553) Pulse Rate:  [103-117] 106 (10/24 0553) Resp:  [18-20] 20 (10/24 0553) BP: (131-152)/(64-82) 149/81 mmHg (10/24 0953) SpO2:  [95 %-98 %] 95 % (10/24 0847)  PHYSICAL EXAMINATION: General:  Chronically ill appearing white male, not in acute distress but resp efforts are labored  Neuro:  Awake, alert, oriented  HEENT:  [place], no JVD  Cardiovascular:  rrr Lungs:  Wheeze on left, decreased on right  Abdomen:  Non-tender  Musculoskeletal:  Intact, RLE weak    Recent Labs Lab 12/27/12 0645 12/28/12 0516 12/29/12 0530  NA 136 136 137  K 4.4 4.1 4.2  CL 106 106 108  CO2 22 21 19   BUN 16 21 19   CREATININE 0.86 0.87 0.80  GLUCOSE 155* 133* 115*    Recent Labs Lab 01/05/2013 1345 12/27/12 0645 12/29/12 0530  HGB 8.7* 9.1* 8.9*  HCT 27.8* 28.2* 27.6*  WBC 9.2 7.2 11.4*  PLT 197 198 198   Dg Chest 2 View  12/29/2012   CLINICAL DATA:  Rapid respiration  EXAM: CHEST  2 VIEW  COMPARISON:  12/20/2012   FINDINGS: Cardiomediastinal silhouette is stable. Again noted right upper lobe medial paratracheal mass. There is streaky airspace is in right upper lobe just lateral to the mass. Postobstructive pneumonia cannot be excluded. Clinical correlation is necessary. Stable right Port-A-Cath position. Osteopenia and degenerative changes thoracic spine.  IMPRESSION: Again noted right upper lobe medial paratracheal mass. There is streaky airspace is in right upper lobe just lateral to the mass. Postobstructive pneumonia cannot be excluded. Clinical correlation is necessary.   Electronically Signed   By: Natasha Mead M.D.   On: 12/29/2012 15:37   Ct Angio Chest Pe W/cm &/or Wo Cm  12/30/2012   CLINICAL DATA:  Shortness of breath. Left axillary mass noted over the prior 2 weeks. History of lung cancer with ongoing chemotherapy and radiation therapy. Evaluate for pulmonary embolism.  EXAM: CT ANGIOGRAPHY CHEST WITH CONTRAST  TECHNIQUE: Multidetector CT imaging of the chest was performed using the standard protocol during bolus administration of intravenous contrast. Multiplanar CT image reconstructions including MIPs were obtained to evaluate the vascular anatomy.  CONTRAST:  80mL OMNIPAQUE IOHEXOL 350 MG/ML SOLN  COMPARISON:  Chest CT 10/21/2012.  FINDINGS: Mediastinum: Study is limited for evaluation of pulmonary embolism by a large amount of respiratory motion. With these limitations in mind, there is no evidence of central, lobar or proximal segmental sized pulmonary embolism. Distal segmental and subsegmental sized emboli cannot be entirely excluded. Heart size is normal. There is no significant pericardial fluid, thickening or pericardial calcification. There is atherosclerosis of the thoracic aorta, the great vessels of the mediastinum and the coronary arteries, including calcified atherosclerotic plaque in the left main, left anterior descending, left circumflex and right coronary arteries. Marked right hilar and  mediastinal lymphadenopathy.  Right hilar lymph nodes appear matted together in a conglomerate mass like fashion and are therefore difficult to measure. There is a discrete right hilar lymph node on image 44 of series 4 which measures 17 mm in short axis. Subcarinal lymph nodes are enlarged measuring up to 14 mm in short axis. Low right paratracheal lymphadenopathy 24 mm in short axis. Esophagus is unremarkable in appearance. Right internal jugular single-lumen porta cath with tip terminating in the distal superior vena cava.  Lungs/Pleura: Large right-sided hydropneumothorax with large pneumothorax component and moderate pleural effusion. Several areas of nodular pleural thickening and enhancement are identified in the right hemithorax, indicative of a malignant right pleural effusion with pleural involvement. Previously noted right upper lobe mass has significantly increased in size, currently measuring up to 7.5 x 5.2 cm (image 29 of series 4). Passive atelectasis throughout much of the right lung, particularly in the basal segments of the right lower lobe. Postobstructive changes (likely pneumonitis) in the right upper lobe surrounding the right upper lobe mass. There is a background of moderate centrilobular and paraseptal emphysema.  Upper Abdomen: There are now innumerable hypovascular lesions scattered throughout the hepatic parenchyma, compatible with widespread hepatic metastases. The largest of these is in the right lobe of the liver measuring 1.7 cm in diameter. There is also likely some retroperitoneal lymphadenopathy, immediately posterior to the origin of the right renal artery, however, this area is incompletely visualized on today's examination.  Musculoskeletal: Numerous markedly enlarged left axillary and subpectoral lymph nodes, compatible with nodal metastases. The largest node in the left axillary region measures up to 6.3 x 3.8 cm. Again noted are multiple lytic lesions, including a large lytic  lesion in the posterior aspect of the T7 vertebral body, lateral aspect of the left 7th rib and lateral aspect of the right 7th rib.  Review of the MIP images confirms the above findings.  IMPRESSION: 1. Limited evaluation for pulmonary embolism demonstrating no evidence of central, lobar or proximal segmental sized pulmonary embolism at this time. 2. Progression of disease with marked enlargement of right upper lobe mass, development of right hilar and mediastinal lymphadenopathy in addition to marked left axillary and subpectoral lymphadenopathy, probable retroperitoneal lymphadenopathy, malignant right pleural effusion with extensive right pleural nodularity, and widespread metastases throughout the liver. 3. Additional findings, as above. Critical Value/emergent results were called by telephone at the time of interpretation on 12/30/2012 at 9:53 AM to Dr.PREETHA [facility] , who verbally acknowledged these results.   Electronically Signed   By: Trudie Reed M.D.   On: 12/30/2012 09:56    ASSESSMENT / PLAN: Acute respiratory distress in the setting of Large right Hydropneumothorax.  Post-obstructive PNA He had some effusion on presentation w/ prob post-obstructive PNA which may have been a contributing factor to the spontaneous PTX as well possibly the progression of his cancer  COPD Stage IV NSCLC w/ extensive mets to bone  Plan -move to SDU -place Chest tube to 20 cm H20   -serial CXR -ck PCT -add empiric abx (zosyn) -cont Tarceva -ok to resume palliative XRT to spine once medically stabilized  Hypertension  -continue outpt meds   GERD (gastroesophageal reflux disease)  -place on PPI    Anemia  -stable, monitor and further manage as appropriate    Caryl Bis  [phone]  Cell  [phone]  If no response or cell goes to voicemail, call beeper [phone]  Pulmonary and Critical Care Medicine [facility] Pager: [phone]  12/30/2012, 10:48 AM

## 2012-12-30 NOTE — Procedures (Signed)
Wayne catheter Chest Tube Insertion Procedure Note  Indications:  Clinically significant Pneumothorax  Pre-operative Diagnosis: Pneumothorax  Post-operative Diagnosis: Pneumothorax  Procedure Details  Informed consent was obtained for the procedure, including sedation.  Risks of lung perforation, hemorrhage, arrhythmia, and adverse drug reaction were discussed.   After sterile skin prep, using standard technique, a 14 French tube was placed in the right lateral 6-7 rib space.  Findings: 200 ml of serous fluid obtained, immediate airleak 1/7  Estimated Blood Loss:  Minimal         Specimens:  None              Complications:  None; patient tolerated the procedure well.         Disposition: ICU - extubated and stable.         Condition: stable  Attending Attestation: I was present and scrubbed for the entire procedure. Dorcas Carrow Beeper  [phone]  Cell  [phone]  If no response or cell goes to voicemail, call beeper [phone]

## 2012-12-30 NOTE — Progress Notes (Signed)
OT Cancellation Note  Patient Details Name: Joel Gibson MRN: [id number] DOB: 1931/07/27   Cancelled Treatment:    Reason Eval/Treat Not Completed: Other (comment).  Pt will have CT to r/o PE.  Will check back another time.    Harveen Flesch 12/30/2012, 9:15 AM [name], OTR/L [phone] 12/30/2012

## 2012-12-30 NOTE — Progress Notes (Signed)
Dr Jomarie Longs made aware of tachy to 140s while amb to BR overnight last night per night RN. Pt w/ exp wheezes and increased WOB this AM.

## 2012-12-30 NOTE — Progress Notes (Signed)
PT Cancellation Note  Patient Details Name: Joel Gibson MRN: [id number] DOB: [birth year]   Cancelled Treatment:    Reason Eval/Treat Not Completed: Medical issues which prohibited therapy Pt moved to SDU.   Joel Gibson,Joel Gibson 12/30/2012, 11:28 AM Zenovia Jarred, PT, DPT 12/30/2012 Pager: [phone]

## 2012-12-30 NOTE — Progress Notes (Signed)
ANTIBIOTIC CONSULT NOTE - INITIAL  Pharmacy Consult for Zosyn Indication: Postobstructive PNA  No Known Allergies  Patient Measurements: Height: 5\' 7"  (170.2 cm) Weight: 145 lb 8.1 oz (66 kg) IBW/kg (Calculated) : 66.1  Vital Signs: Temp: 97.2 F (36.2 C) (10/24 0553) Temp src: Oral (10/24 0553) BP: 149/81 mmHg (10/24 0953) Pulse Rate: 106 (10/24 0553) Intake/Output from previous day: 10/23 0701 - 10/24 0700 In: 1462.5 [P.O.:840; I.V.:622.5] Out: 950 [Urine:950] Intake/Output from this shift:    Labs:  Recent Labs  12/28/12 0516 12/29/12 0530  WBC  --  11.4*  HGB  --  8.9*  PLT  --  198  CREATININE 0.87 0.80   Estimated Creatinine Clearance: 69.9 ml/min (by C-G formula based on Cr of 0.8).   Microbiology: Recent Results (from the past 720 hour(s))  RAPID STREP SCREEN     Status: None   Collection Time    12/04/12 12:30 PM      Result Value Range Status   Streptococcus, Group A Screen (Direct) NEGATIVE  NEGATIVE Final   Comment: (NOTE)     A Rapid Antigen test may result negative if the antigen level in the     sample is below the detection level of this test. The FDA has not     cleared this test as a stand-alone test therefore the rapid antigen     negative result has reflexed to a Group A Strep culture.  CULTURE, GROUP A STREP     Status: None   Collection Time    12/04/12 12:30 PM      Result Value Range Status   Specimen Description THROAT   Final   Special Requests NONE   Final   Culture     Final   Value: No Beta Hemolytic Streptococci Isolated     Performed at [facility]   Report Status 12/06/2012 FINAL   Final    Medical History: Past Medical History  Diagnosis Date  . Hyperlipidemia   . Hypertension   . Aortic stenosis     mild to moderate  . Gastritis   . COPD (chronic obstructive pulmonary disease)   . Bronchitis   . Hepatitis     possible  . Hx of cataract surgery     lt eye  . Tobacco abuse   . Shortness of breath      with activity  . Retinal vein occlusion 1990  . GERD (gastroesophageal reflux disease)   . Hemorrhoid   . Aortic stenosis   . Hypertension   . Dyslipidemia   . Glaucoma   . Right rib fracture      Pathological Fracture / 4 weeks ago  . Lung mass     Right Upper Lobe - Lobular Mass  . SOB (shortness of breath)     Mild  . Cough   . Stroke 2002    Right slight difference in vision  . S/P radiation therapy 02/22/12 - 03/07/12    Pelvis/Sacrum/30 Gray/10 Fractions and Left Scapula/Rib/ 8 Gray/1 Fraction   . Status post chemotherapy      Carboplatin/ Abraxane  . Status post chemotherapy Stopped 10/03/12    Gemcitabine  - Progressed  . Lung cancer 02/02/2012  . Bone metastases     Widespread/ Lytic Lesion Left 7t Rib, Left Iliac Pelvis, ribs and scapula  . S/P radiation therapy 10/21/12    SRS Brain / Left Frontal / 20 Gy  . Status post chemotherapy     Docetaxel  with Neulasta Support - Stopped due to Intolerance and Persistent Thrombocytopenia   . On antineoplastic chemotherapy     Tarceva daily / Xgeva Q 4 weeks  . Right sided weakness 01-23-2013  . Hx of fall 23-Jan-2013  . Health care home, active care coordination 2013/01/23  . Pneumothorax     Right Hydro-Pneumothorax  . Encounter for chest tube placement 12/30/13    Medications:  Scheduled:  . amLODipine  2.5 mg Oral Daily  . aspirin EC  81 mg Oral QODAY  . atorvastatin  20 mg Oral q1800  . betaxolol  1 drop Both Eyes Daily  . betaxolol  1 drop Right Eye QHS  . dexamethasone  4 mg Oral Q8H  . dipyridamole-aspirin  1 capsule Oral QHS  . dipyridamole-aspirin  1 capsule Oral QODAY  . enoxaparin (LOVENOX) injection  40 mg Subcutaneous Q24H  . erlotinib  150 mg Oral Daily  . fentaNYL      . irbesartan  300 mg Oral Daily  . levalbuterol  0.63 mg Nebulization Q6H WA  . loratadine  10 mg Oral Daily  . morphine  15 mg Oral Q12H  . multivitamin with minerals  1 tablet Oral QHS  . pantoprazole  40 mg Oral BID AC  .  senna-docusate  1 tablet Oral BID  . tiotropium  18 mcg Inhalation Daily   Infusions:   Assessment: 79 year old male w/ NSCLC stage IV with mets to brain, spine and pelvis. Admitted on 10/21 with new right LE weakness. Found to have new pathological lumbar fracture. Began palliative XRT on 10/22. Moved to SDU on 10/24 after developing spontaneous right pneumothorax/ hydropneumothorax. Beginning Zosyn per Pharmacy protocol for presumed postobstructive pneumonia.  Goal of Therapy:  Eradication of infection Dose per renal function  Plan:  Zosyn 3.375gm IV q8h (4hr extended infusions) Follow up renal function & cultures  Loralee Pacas, PharmD, BCPS Pager: [phone] 12/30/2012,11:35 AM

## 2012-12-30 NOTE — Progress Notes (Signed)
Report called to Selena Batten, RN on SDU. Pt and family aware of transfer to room 1235.

## 2012-12-30 NOTE — Progress Notes (Signed)
CSW provided patient with bed offers.  Joel Yi C.  Jon MSW, LCSW [phone]

## 2012-12-31 ENCOUNTER — Inpatient Hospital Stay (HOSPITAL_COMMUNITY): Payer: Medicare Other

## 2012-12-31 LAB — CBC
HCT: 28.8 % — ABNORMAL LOW (ref 39.0–52.0)
Hemoglobin: 9 g/dL — ABNORMAL LOW (ref 13.0–17.0)
MCH: 31.4 pg (ref 26.0–34.0)
MCHC: 31.3 g/dL (ref 30.0–36.0)
MCV: 100.3 fL — ABNORMAL HIGH (ref 78.0–100.0)
Platelets: 211 10*3/uL (ref 150–400)

## 2012-12-31 LAB — BASIC METABOLIC PANEL
BUN: 21 mg/dL (ref 6–23)
Chloride: 103 mEq/L (ref 96–112)
Creatinine, Ser: 0.84 mg/dL (ref 0.50–1.35)
GFR calc non Af Amer: 81 mL/min — ABNORMAL LOW (ref >90)
Glucose, Bld: 112 mg/dL — ABNORMAL HIGH (ref 70–99)
Potassium: 4.1 mEq/L (ref 3.5–5.1)

## 2012-12-31 NOTE — Progress Notes (Signed)
[facility] PROGRESS NOTE  Joel Gibson ZOX:[id number] DOB: [birth year] DOA: January 15, 2013 PCP: Joel Sauer, MD  Assessment/Plan:  Hydropneumothorax/Post obstructive pnuemonia -greatly appreciate PCCM consult -s/p Emergent chest tube yetserday -Day 2 of IV Zosyn -Repeat CXR improved  L3 Pathologic burst fracture with some retropulsion of bone and extensive epidural tumor as per MRI -Right leg weakness/Pain -continue decadron, starting to improve with Steroids -Rad Onc/Dr Basilio Cairo following, started XRT10/22 for 10treatments total -CT head with vasogenic edema, will continue with decadron q6, changed to PO -PT/OT  Following, SNF recommended  . Hypercalcemia  -secondary to malignancy/bone mets  -trending down on IVF, steroids   -normal today  . Extensive Bone metastases/Stage 4 non small cell Lung cancer  - Dr Arbutus Ped and Rad Onc following - prognosis appears rather poor - on TArceva  . Hypertension  -continue outpt meds   . GERD (gastroesophageal reflux disease)  -place on PPI   . COPD (chronic obstructive pulmonary disease)  - continue outpt meds, add prn nebs   . Anemia of chronic disease  -stable, monitor   Global: Given Advanced stage Lung CA with Mets to Brain, spine, liver , progressive disease on CTA chest will request Palliative consult for Goals of care, I was unable to discuss this with Dr.Mohamed since he was off on Friday Pt and wife agreeable for meeting  Code Status: full Family Communication: wife at bedside  Disposition Plan: keep in SDU today   Consultants:  Neuro  Radiation -onc  onc  Procedures:  none  Antibiotics:  none  HPI/Subjective: Breathing much better  Objective: Filed Vitals:   12/31/12 1100  BP:   Pulse:   Temp:   Resp: 16    Intake/Output Summary (Last 24 hours) at 12/31/12 1138 Last data filed at 12/31/12 0600  Gross per 24 hour  Intake    330 ml  Output   1095 ml  Net   -765 ml   Filed Weights    01/15/13 1712 12/31/12 0400  Weight: 66 kg (145 lb 8.1 oz) 67.7 kg (149 lb 4 oz)    Exam:  General: alert & oriented x 3, mild distress Cardiovascular: RRR, nl S1 s2 Respiratory: decreased BS at bases, some wheezes in R lung and ronchi Abdomen: soft +BS NT/ND, no masses palpable Extremities: No cyanosis and no edema, RLE foot strength 3/5 and rest of R.Leg 4-5/5, L side strength 5/5     Data Reviewed: Basic Metabolic Panel:  Recent Labs Lab 15-Jan-2013 1345 12/27/12 0645 12/28/12 0516 12/29/12 0530 12/31/12 0730  NA 135 136 136 137 136  K 4.2 4.4 4.1 4.2 4.1  CL 104 106 106 108 103  CO2 22 22 21 19 23   GLUCOSE 112* 155* 133* 115* 112*  BUN 15 16 21 19 21   CREATININE 0.93 0.86 0.87 0.80 0.84  CALCIUM 11.3* 11.0* 10.5 10.0 10.0   Liver Function Tests:  Recent Labs Lab 2013-01-15 1345 12/29/12 0530  AST 46* 48*  ALT 26 40  ALKPHOS 117 125*  BILITOT 0.6 0.4  PROT 6.0 5.5*  ALBUMIN 2.4* 2.3*   No results found for this basename: LIPASE, AMYLASE,  in the last 168 hours No results found for this basename: AMMONIA,  in the last 168 hours CBC:  Recent Labs Lab 01/15/2013 1345 12/27/12 0645 12/29/12 0530 12/31/12 0730  WBC 9.2 7.2 11.4* 11.9*  NEUTROABS 7.3  --   --   --   HGB 8.7* 9.1* 8.9* 9.0*  HCT 27.8* 28.2* 27.6* 28.8*  MCV 102.2* 101.4* 99.6 100.3*  PLT 197 198 198 211   Cardiac Enzymes: No results found for this basename: CKTOTAL, CKMB, CKMBINDEX, TROPONINI,  in the last 168 hours BNP (last 3 results) No results found for this basename: PROBNP,  in the last 8760 hours CBG: No results found for this basename: GLUCAP,  in the last 168 hours  Recent Results (from the past 240 hour(s))  MRSA PCR SCREENING     Status: None   Collection Time    12/30/12  1:16 PM      Result Value Range Status   MRSA by PCR NEGATIVE  NEGATIVE Final   Comment:            The GeneXpert MRSA Assay (FDA     approved for NASAL specimens     only), is one component of a      comprehensive MRSA colonization     surveillance program. It is not     intended to diagnose MRSA     infection nor to guide or     monitor treatment for     MRSA infections.     Studies: Dg Chest 2 View  12/29/2012   CLINICAL DATA:  Rapid respiration  EXAM: CHEST  2 VIEW  COMPARISON:  2013/01/04  FINDINGS: Cardiomediastinal silhouette is stable. Again noted right upper lobe medial paratracheal mass. There is streaky airspace is in right upper lobe just lateral to the mass. Postobstructive pneumonia cannot be excluded. Clinical correlation is necessary. Stable right Port-A-Cath position. Osteopenia and degenerative changes thoracic spine.  IMPRESSION: Again noted right upper lobe medial paratracheal mass. There is streaky airspace is in right upper lobe just lateral to the mass. Postobstructive pneumonia cannot be excluded. Clinical correlation is necessary.   Electronically Signed   By: Natasha Mead M.D.   On: 12/29/2012 15:37   Ct Angio Chest Pe W/cm &/or Wo Cm  12/30/2012   CLINICAL DATA:  Shortness of breath. Left axillary mass noted over the prior 2 weeks. History of lung cancer with ongoing chemotherapy and radiation therapy. Evaluate for pulmonary embolism.  EXAM: CT ANGIOGRAPHY CHEST WITH CONTRAST  TECHNIQUE: Multidetector CT imaging of the chest was performed using the standard protocol during bolus administration of intravenous contrast. Multiplanar CT image reconstructions including MIPs were obtained to evaluate the vascular anatomy.  CONTRAST:  80mL OMNIPAQUE IOHEXOL 350 MG/ML SOLN  COMPARISON:  Chest CT 10/21/2012.  FINDINGS: Mediastinum: Study is limited for evaluation of pulmonary embolism by a large amount of respiratory motion. With these limitations in mind, there is no evidence of central, lobar or proximal segmental sized pulmonary embolism. Distal segmental and subsegmental sized emboli cannot be entirely excluded. Heart size is normal. There is no significant pericardial fluid,  thickening or pericardial calcification. There is atherosclerosis of the thoracic aorta, the great vessels of the mediastinum and the coronary arteries, including calcified atherosclerotic plaque in the left main, left anterior descending, left circumflex and right coronary arteries. Marked right hilar and mediastinal lymphadenopathy. Right hilar lymph nodes appear matted together in a conglomerate mass like fashion and are therefore difficult to measure. There is a discrete right hilar lymph node on image 44 of series 4 which measures 17 mm in short axis. Subcarinal lymph nodes are enlarged measuring up to 14 mm in short axis. Low right paratracheal lymphadenopathy 24 mm in short axis. Esophagus is unremarkable in appearance. Right internal jugular single-lumen porta cath with tip terminating in the distal superior vena cava.  Lungs/Pleura:  Large right-sided hydropneumothorax with large pneumothorax component and moderate pleural effusion. Several areas of nodular pleural thickening and enhancement are identified in the right hemithorax, indicative of a malignant right pleural effusion with pleural involvement. Previously noted right upper lobe mass has significantly increased in size, currently measuring up to 7.5 x 5.2 cm (image 29 of series 4). Passive atelectasis throughout much of the right lung, particularly in the basal segments of the right lower lobe. Postobstructive changes (likely pneumonitis) in the right upper lobe surrounding the right upper lobe mass. There is a background of moderate centrilobular and paraseptal emphysema.  Upper Abdomen: There are now innumerable hypovascular lesions scattered throughout the hepatic parenchyma, compatible with widespread hepatic metastases. The largest of these is in the right lobe of the liver measuring 1.7 cm in diameter. There is also likely some retroperitoneal lymphadenopathy, immediately posterior to the origin of the right renal artery, however, this area is  incompletely visualized on today's examination.  Musculoskeletal: Numerous markedly enlarged left axillary and subpectoral lymph nodes, compatible with nodal metastases. The largest node in the left axillary region measures up to 6.3 x 3.8 cm. Again noted are multiple lytic lesions, including a large lytic lesion in the posterior aspect of the T7 vertebral body, lateral aspect of the left 7th rib and lateral aspect of the right 7th rib.  Review of the MIP images confirms the above findings.  IMPRESSION: 1. Limited evaluation for pulmonary embolism demonstrating no evidence of central, lobar or proximal segmental sized pulmonary embolism at this time. 2. Progression of disease with marked enlargement of right upper lobe mass, development of right hilar and mediastinal lymphadenopathy in addition to marked left axillary and subpectoral lymphadenopathy, probable retroperitoneal lymphadenopathy, malignant right pleural effusion with extensive right pleural nodularity, and widespread metastases throughout the liver. 3. Additional findings, as above. Critical Value/emergent results were called by telephone at the time of interpretation on 12/30/2012 at 9:53 AM to Dr.Lotoya Casella [facility] , who verbally acknowledged these results.   Electronically Signed   By: Trudie Reed M.D.   On: 12/30/2012 09:56   Dg Chest Port 1 View  12/31/2012   CLINICAL DATA:  FOLLOW UP pneumothorax  EXAM: PORTABLE CHEST - 1 VIEW  COMPARISON:  Prior chest x-ray 12/30/2012  FINDINGS: The right-sided pigtail thoracostomy tube has migrated to a more basal position. The right subclavian single-lumen port catheter remains in unchanged position with the tip in the distal SVC. Tiny residual right apical pneumothorax, smaller than before. Persistent right paratracheal mass. Layering right pleural effusion with associated atelectasis. The left lung remains clear. Aortic atherosclerosis.  IMPRESSION: 1. Trace residual apical pneumothorax.  2. Persistent  layering right pleural effusion with associated atelectasis. 3. The tube has migrated to a more basal location.   Electronically Signed   By: Malachy Moan M.D.   On: 12/31/2012 07:31   Dg Chest Port 1 View  12/30/2012   CLINICAL DATA:  Status post right chest tube placement  EXAM: PORTABLE CHEST - 1 VIEW  COMPARISON:  CT PE study 12/30/2012  FINDINGS: Interval placement of a pigtail thoracostomy tube with significant improvement in the large right hydro pneumothorax. There is trace residual apical pneumothorax. Stable right paratracheal mass. Right subclavian approach port a catheter is in unchanged position. Persistent atelectasis of the right lower lobe. Cardiac and mediastinal contours are otherwise stable. The left lung is clear. No acute osseous abnormality. Healed right clavicular fracture.  IMPRESSION: Trace residual apical pneumothorax and persistent right lower lobe atelectasis following  placement of a right pigtail thoracostomy tube.   Electronically Signed   By: Malachy Moan M.D.   On: 12/30/2012 12:58    Scheduled Meds: . amLODipine  2.5 mg Oral Daily  . aspirin EC  81 mg Oral QODAY  . atorvastatin  20 mg Oral q1800  . betaxolol  1 drop Both Eyes Daily  . betaxolol  1 drop Right Eye QHS  . dexamethasone  4 mg Oral Q8H  . dipyridamole-aspirin  1 capsule Oral QHS  . dipyridamole-aspirin  1 capsule Oral QODAY  . enoxaparin (LOVENOX) injection  40 mg Subcutaneous Q24H  . erlotinib  150 mg Oral Daily  . irbesartan  300 mg Oral Daily  . levalbuterol  0.63 mg Nebulization Q6H WA  . loratadine  10 mg Oral Daily  . morphine  15 mg Oral Q12H  . multivitamin with minerals  1 tablet Oral QHS  . pantoprazole  40 mg Oral BID AC  . piperacillin-tazobactam (ZOSYN)  IV  3.375 g Intravenous Q8H  . senna-docusate  1 tablet Oral BID  . tiotropium  18 mcg Inhalation Daily   Continuous Infusions:    Principal Problem:   Hydropneumothorax Active Problems:   Hypertension   Lung  cancer   COPD (chronic obstructive pulmonary disease)   GERD (gastroesophageal reflux disease)   Bone metastases   Anemia of chronic disease   Right leg weakness   Hypercalcemia   Acute lumbar radiculopathy   Metastasis from malignant tumor of lung   Acute respiratory distress   Encounter for chest tube placement    Time spent: 25    [facility]  [facility] Pager [phone]. If 7PM-7AM, please contact night-coverage at [email or web address], password [facility] 12/31/2012, 11:38 AM  LOS: 5 days

## 2012-12-31 NOTE — Progress Notes (Addendum)
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 Admitted on 10/21 with new right LE weakness. Found to have new pathological lumbar fracture by MRI. Began palliative XRT on 10/22. Moved to SDU on 10/24 after developing spontaneous right pneumothorax/ hydropneumothorax.   SIGNIFICANT EVENTS / STUDIES:  MR spine 10/20: 1. Pathologic burst fracture at L3 with some retropulsion of bone and extensive epidural tumor extending into the lateral recess and foramina bilaterally, right greater than left.2. Moderate to severe right lateral recess and foraminal stenosis at L3-4.3. Extensive retroperitoneal lymphadenopathy.4. Mild left foraminal narrowing at L3-4. 5. Extra medullary tumor extends into the psoas muscles bilaterally,right greater than left. 6. Heterogeneous cystic lesions are present within the sacrum and right iliac bone. These likely represent metastases as well.7. Multiple cysts are present within the kidneys bilaterally.  CT chest 10/24: 1. Limited evaluation for pulmonary embolism demonstrating no  evidence of central, lobar or proximal segmental sized pulmonary embolism at this time.  2. Progression of disease with marked enlargement of right upper lobe mass, development of right hilar and mediastinal lymphadenopathy in addition to marked left axillary and subpectoral lymphadenopathy, probable retroperitoneal lymphadenopathy, malignant  right pleural effusion with extensive right pleural nodularity, and widespread metastases throughout the liver. 3. Additional findings, as above.   LINES / TUBES: Port (chonic) Right wayne cath 10/24>>>  CULTURES:   ANTIBIOTICS: Zosyn 10/24>>>  SUBJECTIVE:   Dyspnea is better, lung up on cxr, small leak on CT  VITAL SIGNS: Temp:  [97.4 F (36.3 C)-98.1 F (36.7 C)] 98.1 F (36.7 C) (10/25 0800) Pulse Rate:  [31-122] 110 (10/25 0755) Resp:  [16-25] 16 (10/25 0755) BP: (100-154)/(42-65) 154/63 mmHg (10/25 0755) SpO2:  [94 %-97 %] 97 % (10/25 0858) Weight:  [67.7 kg (149 lb 4 oz)] 67.7 kg (149 lb 4 oz) (10/25 0400)  PHYSICAL EXAMINATION: General:  Chronically ill appearing white male, not in acute distress but resp efforts are labored  Neuro:  Awake, alert, oriented  HEENT:  [place], no JVD  Cardiovascular:  rrr Lungs: improved BS, small air leak on R Abdomen:  Non-tender  Musculoskeletal:  Intact, RLE weak    Recent Labs Lab 12/28/12 0516 12/29/12 0530 12/31/12 0730  NA 136 137 136  K 4.1 4.2 4.1  CL 106 108 103  CO2 21 19 23   BUN 21 19 21   CREATININE 0.87 0.80 0.84  GLUCOSE 133* 115* 112*    Recent Labs Lab 12/27/12 0645 12/29/12 0530 12/31/12 0730  HGB 9.1* 8.9* 9.0*  HCT 28.2* 27.6* 28.8*  WBC 7.2 11.4* 11.9*  PLT 198 198 211   Dg Chest 2 View  12/29/2012   CLINICAL DATA:  Rapid respiration  EXAM: CHEST  2 VIEW  COMPARISON:  01/02/2013  FINDINGS: Cardiomediastinal silhouette is stable. Again noted right upper lobe medial paratracheal mass. There is streaky airspace is in right upper lobe just lateral to the mass. Postobstructive pneumonia cannot be excluded. Clinical correlation is necessary. Stable right Port-A-Cath position. Osteopenia and degenerative changes thoracic spine.  IMPRESSION: Again noted right upper lobe medial paratracheal mass. There is streaky airspace is in right upper lobe just lateral to the mass. Postobstructive pneumonia cannot be  excluded. Clinical correlation is necessary.   Electronically Signed   By: Natasha Mead M.D.   On: 12/29/2012 15:37   Ct Angio Chest Pe W/cm &/or Wo Cm  12/30/2012   CLINICAL DATA:  Shortness of breath. Left axillary mass noted over the prior 2 weeks. History of lung  cancer with ongoing chemotherapy and radiation therapy. Evaluate for pulmonary embolism.  EXAM: CT ANGIOGRAPHY CHEST WITH CONTRAST  TECHNIQUE: Multidetector CT imaging of the chest was performed using the standard protocol during bolus administration of intravenous contrast. Multiplanar CT image reconstructions including MIPs were obtained to evaluate the vascular anatomy.  CONTRAST:  80mL OMNIPAQUE IOHEXOL 350 MG/ML SOLN  COMPARISON:  Chest CT 10/21/2012.  FINDINGS: Mediastinum: Study is limited for evaluation of pulmonary embolism by a large amount of respiratory motion. With these limitations in mind, there is no evidence of central, lobar or proximal segmental sized pulmonary embolism. Distal segmental and subsegmental sized emboli cannot be entirely excluded. Heart size is normal. There is no significant pericardial fluid, thickening or pericardial calcification. There is atherosclerosis of the thoracic aorta, the great vessels of the mediastinum and the coronary arteries, including calcified atherosclerotic plaque in the left main, left anterior descending, left circumflex and right coronary arteries. Marked right hilar and mediastinal lymphadenopathy. Right hilar lymph nodes appear matted together in a conglomerate mass like fashion and are therefore difficult to measure. There is a discrete right hilar lymph node on image 44 of series 4 which measures 17 mm in short axis. Subcarinal lymph nodes are enlarged measuring up to 14 mm in short axis. Low right paratracheal lymphadenopathy 24 mm in short axis. Esophagus is unremarkable in appearance. Right internal jugular single-lumen porta cath with tip terminating in the distal superior vena cava.  Lungs/Pleura: Large right-sided hydropneumothorax with large pneumothorax component and moderate pleural effusion. Several areas of nodular pleural thickening and enhancement are identified in the right hemithorax, indicative of a malignant right pleural effusion with  pleural involvement. Previously noted right upper lobe mass has significantly increased in size, currently measuring up to 7.5 x 5.2 cm (image 29 of series 4). Passive atelectasis throughout much of the right lung, particularly in the basal segments of the right lower lobe. Postobstructive changes (likely pneumonitis) in the right upper lobe surrounding the right upper lobe mass. There is a background of moderate centrilobular and paraseptal emphysema.  Upper Abdomen: There are now innumerable hypovascular lesions scattered throughout the hepatic parenchyma, compatible with widespread hepatic metastases. The largest of these is in the right lobe of the liver measuring 1.7 cm in diameter. There is also likely some retroperitoneal lymphadenopathy, immediately posterior to the origin of the right renal artery, however, this area is incompletely visualized on today's examination.  Musculoskeletal: Numerous markedly enlarged left axillary and subpectoral lymph nodes, compatible with nodal metastases. The largest node in the left axillary region measures up to 6.3 x 3.8 cm. Again noted are multiple lytic lesions, including a large lytic lesion in the posterior aspect of the T7 vertebral body, lateral aspect of the left 7th rib and lateral aspect of the right 7th rib.  Review of the MIP images confirms the above findings.  IMPRESSION: 1. Limited evaluation for pulmonary embolism demonstrating no evidence of central, lobar or proximal segmental sized pulmonary embolism at this time. 2. Progression of disease with marked enlargement of right upper lobe mass, development of right hilar and mediastinal lymphadenopathy in addition to marked left axillary and subpectoral lymphadenopathy, probable retroperitoneal lymphadenopathy, malignant right  pleural effusion with extensive right pleural nodularity, and widespread metastases throughout the liver. 3. Additional findings, as above. Critical Value/emergent results were called by  telephone at the time of interpretation on 12/30/2012 at 9:53 AM to Dr.PREETHA [facility] , who verbally acknowledged these results.   Electronically Signed   By: Trudie Reed M.D.   On: 12/30/2012 09:56   Dg Chest Port 1 View  12/31/2012   CLINICAL DATA:  FOLLOW UP pneumothorax  EXAM: PORTABLE CHEST - 1 VIEW  COMPARISON:  Prior chest x-ray 12/30/2012  FINDINGS: The right-sided pigtail thoracostomy tube has migrated to a more basal position. The right subclavian single-lumen port catheter remains in unchanged position with the tip in the distal SVC. Tiny residual right apical pneumothorax, smaller than before. Persistent right paratracheal mass. Layering right pleural effusion with associated atelectasis. The left lung remains clear. Aortic atherosclerosis.  IMPRESSION: 1. Trace residual apical pneumothorax.  2. Persistent layering right pleural effusion with associated atelectasis. 3. The tube has migrated to a more basal location.   Electronically Signed   By: Malachy Moan M.D.   On: 12/31/2012 07:31   Dg Chest Port 1 View  12/30/2012   CLINICAL DATA:  Status post right chest tube placement  EXAM: PORTABLE CHEST - 1 VIEW  COMPARISON:  CT PE study 12/30/2012  FINDINGS: Interval placement of a pigtail thoracostomy tube with significant improvement in the large right hydro pneumothorax. There is trace residual apical pneumothorax. Stable right paratracheal mass. Right subclavian approach port a catheter is in unchanged position. Persistent atelectasis of the right lower lobe. Cardiac and mediastinal contours are otherwise stable. The left lung is clear. No acute osseous abnormality. Healed right clavicular fracture.  IMPRESSION: Trace residual apical pneumothorax and persistent right lower lobe atelectasis following placement of a right pigtail thoracostomy tube.   Electronically Signed   By: Malachy Moan M.D.   On: 12/30/2012 12:58    ASSESSMENT / PLAN: Acute respiratory distress in the setting  of Large right Hydropneumothorax. Improved with CT placement Post-obstructive PNA COPD Stage IV NSCLC w/ extensive mets to bone Evident mets to pleural space  Plan -keep in SDU -cont Chest tube to -20 cm H20   -serial CXR -add empiric abx (zosyn) -cont Tarceva -ok to resume palliative XRT to spine TODAY  Hypertension  -continue outpt meds   GERD (gastroesophageal reflux disease)  -place on PPI    Anemia  -stable, monitor and further manage as appropriate   KEEP IN SDU with CT management for now   Caryl Bis  [phone]  Cell  [phone]  If no response or cell goes to voicemail, call beeper [phone]  Pulmonary and Critical Care Medicine [facility] Pager: [phone]  12/31/2012, 9:59 AM

## 2013-01-01 DIAGNOSIS — C3490 Malignant neoplasm of unspecified part of unspecified bronchus or lung: Secondary | ICD-10-CM

## 2013-01-01 DIAGNOSIS — C801 Malignant (primary) neoplasm, unspecified: Secondary | ICD-10-CM

## 2013-01-01 DIAGNOSIS — Z515 Encounter for palliative care: Secondary | ICD-10-CM

## 2013-01-01 DIAGNOSIS — J984 Other disorders of lung: Secondary | ICD-10-CM

## 2013-01-01 LAB — CBC
HCT: 26.7 % — ABNORMAL LOW (ref 39.0–52.0)
Hemoglobin: 8.4 g/dL — ABNORMAL LOW (ref 13.0–17.0)
MCH: 31.5 pg (ref 26.0–34.0)
MCHC: 31.5 g/dL (ref 30.0–36.0)
Platelets: 190 10*3/uL (ref 150–400)

## 2013-01-01 LAB — BASIC METABOLIC PANEL
BUN: 29 mg/dL — ABNORMAL HIGH (ref 6–23)
Chloride: 101 mEq/L (ref 96–112)
Creatinine, Ser: 0.94 mg/dL (ref 0.50–1.35)
Glucose, Bld: 127 mg/dL — ABNORMAL HIGH (ref 70–99)
Potassium: 3.8 mEq/L (ref 3.5–5.1)

## 2013-01-01 MED ORDER — POLYETHYLENE GLYCOL 3350 17 G PO PACK
17.0000 g | PACK | Freq: Every day | ORAL | Status: DC
  Administered 2013-01-01: 17 g via ORAL
  Filled 2013-01-01 (×2): qty 1

## 2013-01-01 MED ORDER — MORPHINE SULFATE ER 30 MG PO TBCR
30.0000 mg | EXTENDED_RELEASE_TABLET | Freq: Two times a day (BID) | ORAL | Status: DC
  Administered 2013-01-01 – 2013-01-09 (×17): 30 mg via ORAL
  Filled 2013-01-01 (×17): qty 1

## 2013-01-01 NOTE — Consult Note (Signed)
Palliative Medicine Team Consult Note  Mr. Yurkovich has Stage IV NSCLC with mets to brain and bone. He was admitted with pneumothorax secondary to malignant effusion and post-obstructive PNA. He has major issues with hip pain and pelvic pain from mets-including L3 burst fracture.  Summary of Goals:  1. DNR, patient made his decision by himself, his wife "could not speak" he was very direct about his choice, and very clear-his wife is paralyzed by grief and not at all accepting of his current situation -at least outwardly. She told me she would only "listen" to what I had to say-the patient however was very active in participating in our conversation and very grateful for the opportunity to talk about his EOL wishes.  2. Other Goals:   Quality of Life primary goal- "I want to go out feeling the glory"  "I want to die on my back porch overlooking my beautiful [place]"   Pain control but "not ready for morphine drip"- wants to maintain awareness.  Wants no dietary limitations  Wants to try to "get better from reversible problems" of with current level of treatment if they can help him to go home.  Continue Tarceva and Radiation is appropriate per Rad Onc and Onc.  Recommendations:  1. Will call Dr. Odette Fraction in AM for interventional pain consult for his epidural mets.  2. DNR on chart  3. Continue to treat reversible problems.  4. Wife needs acute grief counseling- will ask Lauren from [facility] to assist.   Disposition:  He has long term care insurance that covers 24/7 in home care -he would be a good candidate for home with hospice but may not qualify on Tarceva or if getting radiation-eventually he could transition to this-I educated wife and patient on Hospice options. Wife says they are planning on [facility] for rehab after this hospitalization.  Anderson Malta, DO Palliative Medicine

## 2013-01-01 NOTE — Progress Notes (Signed)
PULMONARY  / CRITICAL CARE MEDICINE  Name: Joel Gibson MRN: [id number] DOB: [birth year]    ADMISSION DATE:  12/09/2012 CONSULTATION DATE:  10/24  REFERRING MD :  Jomarie Longs  PRIMARY SERVICE:   [facility]   CHIEF COMPLAINT:   Hydropneumothorax   BRIEF PATIENT DESCRIPTION:  79 year old male w/ NSCLC stage IV (mets to brain, spine and pelvis. Admitted on 10/21 with new right LE weakness. Found to have new pathological lumbar fracture by MRI. Began palliative XRT on 10/22. Moved to SDU on 10/24 after developing spontaneous right pneumothorax/ hydropneumothorax.   SIGNIFICANT EVENTS / STUDIES:  MR spine 10/20: 1. Pathologic burst fracture at L3 with some retropulsion of bone and extensive epidural tumor extending into the lateral recess and foramina bilaterally, right greater than left.2. Moderate to severe right lateral recess and foraminal stenosis at L3-4.3. Extensive retroperitoneal lymphadenopathy.4. Mild left foraminal narrowing at L3-4. 5. Extra medullary tumor extends into the psoas muscles bilaterally,right greater than left. 6. Heterogeneous cystic lesions are present within the sacrum and right iliac bone. These likely represent metastases as well.7. Multiple cysts are present within the kidneys bilaterally.  CT chest 10/24: 1. Limited evaluation for pulmonary embolism demonstrating no  evidence of central, lobar or proximal segmental sized pulmonary embolism at this time.  2. Progression of disease with marked enlargement of right upper lobe mass, development of right hilar and mediastinal lymphadenopathy in addition to marked left axillary and subpectoral lymphadenopathy, probable retroperitoneal lymphadenopathy, malignant  right pleural effusion with extensive right pleural nodularity, and widespread metastases throughout the liver. 3. Additional findings, as above.   LINES / TUBES: Port (chonic) Right wayne cath 10/24>>>  CULTURES:   ANTIBIOTICS: Zosyn 10/24>>>  SUBJECTIVE:   Pt c/o frequency/dysuria   VITAL SIGNS: Temp:  [97.4 F (36.3 C)-98.4 F (36.9 C)] 97.6 F (36.4 C) (10/26 0800) Pulse Rate:  [43-143] 106 (10/26 0800) Resp:  [16-29] 19 (10/26 0800) BP: (102-138)/(47-78) 136/78 mmHg (10/26 0800) SpO2:  [93 %-100 %] 97 % (10/26 0800) Weight:  [66.7 kg (147 lb 0.8 oz)] 66.7 kg (147 lb 0.8 oz) (10/26 0400)  PHYSICAL EXAMINATION: General:  Chronically ill appearing white male, not in acute distress but resp efforts are labored  Neuro:  Awake, alert, oriented  HEENT:  [place], no JVD  Cardiovascular:  rrr Lungs: improved BS, small air leak on R Abdomen:  Non-tender  Musculoskeletal:  Intact, RLE weak   Recent Labs Lab 12/29/12 0530 12/31/12 0730 01/01/13 0430  NA 137 136 132*  K 4.2 4.1 3.8  CL 108 103 101  CO2 19 23 23   BUN 19 21 29*  CREATININE 0.80 0.84 0.94  GLUCOSE 115* 112* 127*    Recent Labs Lab 12/29/12 0530 12/31/12 0730 01/01/13 0430  HGB 8.9* 9.0* 8.4*  HCT 27.6* 28.8* 26.7*  WBC 11.4* 11.9* 10.7*  PLT 198 211 190   Dg Chest Port 1 View  12/31/2012   CLINICAL DATA:  FOLLOW UP pneumothorax  EXAM: PORTABLE CHEST - 1 VIEW  COMPARISON:  Prior chest x-ray 12/30/2012  FINDINGS: The right-sided pigtail thoracostomy tube has migrated to a more basal position. The right subclavian single-lumen port catheter remains in unchanged position with the tip in the distal SVC. Tiny residual right apical pneumothorax, smaller than before. Persistent right paratracheal mass. Layering right pleural effusion with associated atelectasis. The left lung remains clear. Aortic atherosclerosis.  IMPRESSION: 1. Trace residual apical pneumothorax.  2. Persistent layering right pleural effusion with associated atelectasis. 3. The tube  has migrated to a more basal location.   Electronically Signed   By: Malachy Moan M.D.   On: 12/31/2012 07:31   Dg Chest Port 1 View  12/30/2012   CLINICAL DATA:  Status post right chest tube placement  EXAM: PORTABLE  CHEST - 1 VIEW  COMPARISON:  CT PE study 12/30/2012  FINDINGS: Interval placement of a pigtail thoracostomy tube with significant improvement in the large right hydro pneumothorax. There is trace residual apical pneumothorax. Stable right paratracheal mass. Right subclavian approach port a catheter is in unchanged position. Persistent atelectasis of the right lower lobe. Cardiac and mediastinal contours are otherwise stable. The left lung is clear. No acute osseous abnormality. Healed right clavicular fracture.  IMPRESSION: Trace residual apical pneumothorax and persistent right lower lobe atelectasis following placement of a right pigtail thoracostomy tube.   Electronically Signed   By: Malachy Moan M.D.   On: 12/30/2012 12:58    ASSESSMENT / PLAN: Acute respiratory distress in the setting of Large right Hydropneumothorax. Improved with CT placement Post-obstructive PNA COPD Stage IV NSCLC w/ extensive mets to bone Evident mets to pleural space  Plan -keep in SDU -cont Chest tube to -20 cm H20   -serial CXR -cont  (zosyn) -cont Tarceva -ok to resume palliative XRT to spine   Hypertension  -continue outpt meds   GERD (gastroesophageal reflux disease)  -place on PPI    Anemia  -stable, monitor and further manage as appropriate   Place foley with frequency and bladder distension for EOL care   Ok to transfer to oncology floor at this time if Int med ok with this   Caryl Bis  [phone]  Cell  [phone]  If no response or cell goes to voicemail, call beeper [phone]  Pulmonary and Critical Care Medicine [facility] Pager: [phone]  01/01/2013, 10:31 AM

## 2013-01-01 NOTE — Progress Notes (Signed)
[facility] PROGRESS NOTE  Joel Gibson:[id number] DOB: 20-May-1931 DOA: 12/30/2012 PCP: [name], MD  Assessment/Plan:  Hydropneumothorax/Post obstructive pnuemonia -greatly appreciate PCCM consult -s/p Emergent chest tube 10/24 -Day 3 of IV Zosyn -Repeat CXR improved  L3 Pathologic burst fracture with some retropulsion of bone and extensive epidural tumor as per MRI -Right leg weakness/Pain -continue decadron, starting to improve with Steroids -Rad Onc/Dr Basilio Cairo following, started XRT10/22 for 10treatments total -CT head with vasogenic edema, will continue with decadron q6, changed to PO -PT/OT  Following, SNF recommended -increase MSContin for pain  . Hypercalcemia  -secondary to malignancy/bone mets  -trending down on IVF, steroids   -normal today  . Extensive Bone metastases/Stage 4 non small cell Lung cancer  - Dr Arbutus Ped and Rad Onc following - prognosis appears rather poor - on TArceva  . Hypertension  -continue outpt meds   . GERD (gastroesophageal reflux disease)  -place on PPI   . COPD (chronic obstructive pulmonary disease)  - continue outpt meds, add prn nebs   . Anemia of chronic disease  -stable, monitor   Global: Given Advanced stage Lung CA with Mets to Brain, spine, liver , progressive disease on CTA chest, requested Palliative consult for Goals of care, I was unable to discuss this with Dr.Mohamed since he was off on Friday Pt and wife agreeable for meeting  Code Status: full Family Communication: wife at bedside  Disposition Plan: keep in SDU today   Consultants:  Neuro  Radiation -onc  onc   Pulm  Procedures:  Chest tube 10/24  Antibiotics:  Zosyn 10/24  HPI/Subjective: Breathing much better, having more pain in R Hip today, RLE weakness unchanged from yesterday  Objective: Filed Vitals:   01/01/13 0800  BP: 136/78  Pulse: 106  Temp: 97.6 F (36.4 C)  Resp: 19    Intake/Output Summary (Last 24  hours) at 01/01/13 0939 Last data filed at 01/01/13 0900  Gross per 24 hour  Intake    790 ml  Output   1910 ml  Net  -1120 ml   Filed Weights   12-30-12 1712 12/31/12 0400 01/01/13 0400  Weight: 66 kg (145 lb 8.1 oz) 67.7 kg (149 lb 4 oz) 66.7 kg (147 lb 0.8 oz)    Exam:  General: alert & oriented x 3, mild distress Cardiovascular: RRR, nl S1 s2 Respiratory: decreased BS at bases, occasional ronchi Abdomen: soft +BS NT/ND, no masses palpable Extremities: No cyanosis and no edema, RLE foot strength 3/5 and rest of R.Leg 4-5/5, L side strength 5/5     Data Reviewed: Basic Metabolic Panel:  Recent Labs Lab 12/27/12 0645 12/28/12 0516 12/29/12 0530 12/31/12 0730 01/01/13 0430  NA 136 136 137 136 132*  K 4.4 4.1 4.2 4.1 3.8  CL 106 106 108 103 101  CO2 22 21 19 23 23   GLUCOSE 155* 133* 115* 112* 127*  BUN 16 21 19 21  29*  CREATININE 0.86 0.87 0.80 0.84 0.94  CALCIUM 11.0* 10.5 10.0 10.0 9.4   Liver Function Tests:  Recent Labs Lab Dec 30, 2012 1345 12/29/12 0530  AST 46* 48*  ALT 26 40  ALKPHOS 117 125*  BILITOT 0.6 0.4  PROT 6.0 5.5*  ALBUMIN 2.4* 2.3*   No results found for this basename: LIPASE, AMYLASE,  in the last 168 hours No results found for this basename: AMMONIA,  in the last 168 hours CBC:  Recent Labs Lab 12/30/12 1345 12/27/12 0645 12/29/12 0530 12/31/12 0730 01/01/13 0430  WBC  9.2 7.2 11.4* 11.9* 10.7*  NEUTROABS 7.3  --   --   --   --   HGB 8.7* 9.1* 8.9* 9.0* 8.4*  HCT 27.8* 28.2* 27.6* 28.8* 26.7*  MCV 102.2* 101.4* 99.6 100.3* 100.0  PLT 197 198 198 211 190   Cardiac Enzymes: No results found for this basename: CKTOTAL, CKMB, CKMBINDEX, TROPONINI,  in the last 168 hours BNP (last 3 results) No results found for this basename: PROBNP,  in the last 8760 hours CBG: No results found for this basename: GLUCAP,  in the last 168 hours  Recent Results (from the past 240 hour(s))  MRSA PCR SCREENING     Status: None   Collection Time     12/30/12  1:16 PM      Result Value Range Status   MRSA by PCR NEGATIVE  NEGATIVE Final   Comment:            The GeneXpert MRSA Assay (FDA     approved for NASAL specimens     only), is one component of a     comprehensive MRSA colonization     surveillance program. It is not     intended to diagnose MRSA     infection nor to guide or     monitor treatment for     MRSA infections.     Studies: Dg Chest Port 1 View  12/31/2012   CLINICAL DATA:  FOLLOW UP pneumothorax  EXAM: PORTABLE CHEST - 1 VIEW  COMPARISON:  Prior chest x-ray 12/30/2012  FINDINGS: The right-sided pigtail thoracostomy tube has migrated to a more basal position. The right subclavian single-lumen port catheter remains in unchanged position with the tip in the distal SVC. Tiny residual right apical pneumothorax, smaller than before. Persistent right paratracheal mass. Layering right pleural effusion with associated atelectasis. The left lung remains clear. Aortic atherosclerosis.  IMPRESSION: 1. Trace residual apical pneumothorax.  2. Persistent layering right pleural effusion with associated atelectasis. 3. The tube has migrated to a more basal location.   Electronically Signed   By: Malachy Moan M.D.   On: 12/31/2012 07:31   Dg Chest Port 1 View  12/30/2012   CLINICAL DATA:  Status post right chest tube placement  EXAM: PORTABLE CHEST - 1 VIEW  COMPARISON:  CT PE study 12/30/2012  FINDINGS: Interval placement of a pigtail thoracostomy tube with significant improvement in the large right hydro pneumothorax. There is trace residual apical pneumothorax. Stable right paratracheal mass. Right subclavian approach port a catheter is in unchanged position. Persistent atelectasis of the right lower lobe. Cardiac and mediastinal contours are otherwise stable. The left lung is clear. No acute osseous abnormality. Healed right clavicular fracture.  IMPRESSION: Trace residual apical pneumothorax and persistent right lower lobe  atelectasis following placement of a right pigtail thoracostomy tube.   Electronically Signed   By: Malachy Moan M.D.   On: 12/30/2012 12:58    Scheduled Meds: . amLODipine  2.5 mg Oral Daily  . aspirin EC  81 mg Oral QODAY  . atorvastatin  20 mg Oral q1800  . betaxolol  1 drop Both Eyes Daily  . betaxolol  1 drop Right Eye QHS  . dexamethasone  4 mg Oral Q8H  . dipyridamole-aspirin  1 capsule Oral QHS  . dipyridamole-aspirin  1 capsule Oral QODAY  . erlotinib  150 mg Oral Daily  . irbesartan  300 mg Oral Daily  . levalbuterol  0.63 mg Nebulization Q6H WA  . loratadine  10 mg Oral Daily  . morphine  30 mg Oral Q12H  . multivitamin with minerals  1 tablet Oral QHS  . pantoprazole  40 mg Oral BID AC  . piperacillin-tazobactam (ZOSYN)  IV  3.375 g Intravenous Q8H  . polyethylene glycol  17 g Oral Daily  . senna-docusate  1 tablet Oral BID  . tiotropium  18 mcg Inhalation Daily   Continuous Infusions:    Principal Problem:   Hydropneumothorax Active Problems:   Hypertension   Lung cancer   COPD (chronic obstructive pulmonary disease)   GERD (gastroesophageal reflux disease)   Bone metastases   Anemia of chronic disease   Right leg weakness   Hypercalcemia   Acute lumbar radiculopathy   Metastasis from malignant tumor of lung   Acute respiratory distress   Encounter for chest tube placement    Time spent: 25    [facility]  [facility] Pager [phone]. If 7PM-7AM, please contact night-coverage at [email or web address], password [facility] 01/01/2013, 9:39 AM  LOS: 6 days

## 2013-01-01 NOTE — Progress Notes (Signed)
Patient transferring to room 1443.  Report called to Dwana Curd, Charity fundraiser.  Patient to travel by bed.  Will continue to monitor.

## 2013-01-02 ENCOUNTER — Ambulatory Visit: Payer: Medicare Other

## 2013-01-02 ENCOUNTER — Encounter (HOSPITAL_COMMUNITY): Payer: Self-pay | Admitting: Student

## 2013-01-02 ENCOUNTER — Ambulatory Visit
Admit: 2013-01-02 | Discharge: 2013-01-02 | Disposition: A | Discharge status: Home / Self Care | Payer: Medicare Other | Attending: Radiation Oncology | Admitting: Radiation Oncology

## 2013-01-02 DIAGNOSIS — C3490 Malignant neoplasm of unspecified part of unspecified bronchus or lung: Secondary | ICD-10-CM

## 2013-01-02 DIAGNOSIS — C7931 Secondary malignant neoplasm of brain: Secondary | ICD-10-CM

## 2013-01-02 DIAGNOSIS — C801 Malignant (primary) neoplasm, unspecified: Secondary | ICD-10-CM

## 2013-01-02 DIAGNOSIS — G893 Neoplasm related pain (acute) (chronic): Secondary | ICD-10-CM

## 2013-01-02 DIAGNOSIS — Z515 Encounter for palliative care: Secondary | ICD-10-CM

## 2013-01-02 DIAGNOSIS — C7951 Secondary malignant neoplasm of bone: Secondary | ICD-10-CM

## 2013-01-02 LAB — CBC
MCH: 31.9 pg (ref 26.0–34.0)
MCHC: 31.5 g/dL (ref 30.0–36.0)
MCV: 101.1 fL — ABNORMAL HIGH (ref 78.0–100.0)
Platelets: 202 10*3/uL (ref 150–400)
RDW: 18.7 % — ABNORMAL HIGH (ref 11.5–15.5)

## 2013-01-02 LAB — BASIC METABOLIC PANEL
Calcium: 9.8 mg/dL (ref 8.4–10.5)
Creatinine, Ser: 1.03 mg/dL (ref 0.50–1.35)
GFR calc Af Amer: 78 mL/min — ABNORMAL LOW (ref >90)
GFR calc non Af Amer: 67 mL/min — ABNORMAL LOW (ref >90)
Potassium: 4.1 mEq/L (ref 3.5–5.1)
Sodium: 136 mEq/L (ref 135–145)

## 2013-01-02 MED ORDER — POLYETHYLENE GLYCOL 3350 17 G PO PACK
17.0000 g | PACK | Freq: Every day | ORAL | Status: DC | PRN
  Filled 2013-01-02: qty 1

## 2013-01-02 NOTE — Progress Notes (Signed)
Palliative Medicine Team Progress Note  Mr. Joel Gibson is OOB in chair today. We continued our discussion on GOC today and talked in detail about possible disease trajectories and how to access resources along the way ie; navigating rehab, transitioning to hospice, continuing Tarceva, utilizing his LTC insurance for home care and most importantly we discussed his pain management options. He is quite stoic about his pain-but have some severe "episodes" where he states it is "unbearable" particularly with weight bearing and movement. Pain in right leg and hip is severe and chronic with debilitating "lancing pain" several times a day-he is unable to walk now due to pain progression. His Ctx is still in place which make mobility more difficult PCCM discussing paliative focused options for the chest tube. Patient tells me today "I know my wife doesn't want to hear this but I don't wanna be one of those people sitting in the waiting room at the cancer center who look like they are skeletons and have lost a bunch of weight-the ones who look near death-I want to go out strong if possible". His wife then promptly told me that he "didnt want to buy a ticket however". I summarized for them the concept of quality first and quantity when possible- and patient very much agreed.   Will consult Dr. Ollen Bowl for interventional pain consult  Will provide general Hospice information for planning purposes-the tarceva issues is tough-will need to work with Dr. Shirline Frees to determine that endpoint.  They still want to go to [facility] for rehab attempt.  Anderson Malta, DO Palliative Medicine

## 2013-01-02 NOTE — Progress Notes (Signed)
Subjective: The patient is seen and examined today. He is feeling much better with less pain in the back. He also has improvement in the strength of the lower extremities after starting radiotherapy and Decadron. He denied having any fever or chills. The patient denied having any nausea or vomiting. He indicated his interest in palliative care at this point but he and his wife would like to continue treatment with Tarceva.  Objective: Vital signs in last 24 hours: Temp:  [97.7 F (36.5 C)-98.1 F (36.7 C)] 98.1 F (36.7 C) (10/27 1527) Pulse Rate:  [59-110] 69 (10/27 1527) Resp:  [16-17] 16 (10/27 1527) BP: (99-122)/(49-64) 99/49 mmHg (10/27 1527) SpO2:  [95 %-99 %] 99 % (10/27 1527)  Intake/Output from previous day: 10/26 0701 - 10/27 0700 In: 905 [P.O.:720; IV Piggyback:125] Out: 450 [Urine:300; Chest Tube:150] Intake/Output this shift: Total I/O In: 480 [P.O.:480] Out: 250 [Urine:250]  General appearance: alert, cooperative, fatigued and no distress Resp: clear to auscultation bilaterally Cardio: regular rate and rhythm, S1, S2 normal, no murmur, click, rub or gallop GI: soft, non-tender; bowel sounds normal; no masses,  no organomegaly Extremities: extremities normal, atraumatic, no cyanosis or edema  Lab Results:   Recent Labs  01/01/13 0430 01/02/13 0613  WBC 10.7* 13.0*  HGB 8.4* 8.6*  HCT 26.7* 27.3*  PLT 190 202   BMET  Recent Labs  01/01/13 0430 01/02/13 0613  NA 132* 136  K 3.8 4.1  CL 101 104  CO2 23 22  GLUCOSE 127* 119*  BUN 29* 30*  CREATININE 0.94 1.03  CALCIUM 9.4 9.8    Studies/Results: No results found.  Medications: I have reviewed the patient's current medications.  Assessment/Plan: This is a very pleasant 79 years old white male with metastatic non-small cell lung cancer, squamous cell carcinoma status post several chemotherapy regimen and currently on treatment with oral Tarceva 150 mg by mouth daily and tolerating it fairly well.  The patient has multiple metastatic bone disease and he is currently undergoing palliative radiotherapy to the lower lumbar spine with improvement in his pain and muscle strength. I have a lengthy discussion with the patient and his wife. They are interested in palliative care but at the same time they would like to continue treatment with Tarceva. I explained to the patient and his wife that hospice may not approve his treatment with Tarceva. The patient has another one-month supply of the treatment. I recommended for him to finish his current treatment with Tarceva and will be happy to consider him for restaging scan to see if he has any response to this treatment. Disposition, the patient is awaiting transfer to skilled nursing facility with transportation ability to receive his palliative radiation treatment. Thank you for taking good care of Mr. Zapanta, I will continue to follow up the patient with you and assist in his management an as-needed basis.   LOS: 7 days    Rocklyn Mayberry K. 01/02/2013

## 2013-01-02 NOTE — Progress Notes (Signed)
PULMONARY  / CRITICAL CARE MEDICINE  Name: Joel Gibson MRN: [id number] DOB: [birth year]    ADMISSION DATE:  12/09/2012 CONSULTATION DATE:  10/24  REFERRING MD :  Jomarie Longs  PRIMARY SERVICE:   [facility]   CHIEF COMPLAINT:   Hydropneumothorax   BRIEF PATIENT DESCRIPTION:  79 year old male w/ NSCLC stage IV (mets to brain, spine and pelvis. Admitted on 10/21 with new right LE weakness. Found to have new pathological lumbar fracture by MRI. Began palliative XRT on 10/22. Moved to SDU on 10/24 after developing spontaneous right pneumothorax/ hydropneumothorax.   SIGNIFICANT EVENTS / STUDIES:  MR spine 10/20: 1. Pathologic burst fracture at L3 with some retropulsion of bone and extensive epidural tumor extending into the lateral recess and foramina bilaterally, right greater than left.2. Moderate to severe right lateral recess and foraminal stenosis at L3-4.3. Extensive retroperitoneal lymphadenopathy.4. Mild left foraminal narrowing at L3-4. 5. Extra medullary tumor extends into the psoas muscles bilaterally,right greater than left. 6. Heterogeneous cystic lesions are present within the sacrum and right iliac bone. These likely represent metastases as well.7. Multiple cysts are present within the kidneys bilaterally.  CT chest 10/24: 1. Limited evaluation for pulmonary embolism demonstrating no  evidence of central, lobar or proximal segmental sized pulmonary embolism at this time.  2. Progression of disease with marked enlargement of right upper lobe mass, development of right hilar and mediastinal lymphadenopathy in addition to marked left axillary and subpectoral lymphadenopathy, probable retroperitoneal lymphadenopathy, malignant  right pleural effusion with extensive right pleural nodularity, and widespread metastases throughout the liver. 3. Additional findings, as above.   LINES / TUBES: Port (chonic) Right wayne cath 10/24>>>  CULTURES:   ANTIBIOTICS: Zosyn 10/24>>>  SUBJECTIVE:   Pt c/o frequency/dysuria   VITAL SIGNS: Temp:  [97.5 F (36.4 C)-97.9 F (36.6 C)] 97.7 F (36.5 C) (10/27 0708) Pulse Rate:  [59-113] 59 (10/27 0708) Resp:  [16-18] 16 (10/27 0708) BP: (115-122)/(56-64) 115/64 mmHg (10/27 0708) SpO2:  [95 %-99 %] 95 % (10/27 0815)  PHYSICAL EXAMINATION: General:  Chronically ill appearing white male, not in acute distress. Neuro:  Awake, alert, oriented , rt le weakness HEENT:  [place], no JVD  Cardiovascular:  rrr Lungs: improved BS, small air leak on R Abdomen:  Non-tender  Musculoskeletal:  Intact, RLE weak   Recent Labs Lab 12/31/12 0730 01/01/13 0430 01/02/13 0613  NA 136 132* 136  K 4.1 3.8 4.1  CL 103 101 104  CO2 23 23 22   BUN 21 29* 30*  CREATININE 0.84 0.94 1.03  GLUCOSE 112* 127* 119*    Recent Labs Lab 12/31/12 0730 01/01/13 0430 01/02/13 0613  HGB 9.0* 8.4* 8.6*  HCT 28.8* 26.7* 27.3*  WBC 11.9* 10.7* 13.0*  PLT 211 190 202   No results found.  ASSESSMENT / PLAN: Acute respiratory distress in the setting of Large right Hydropneumothorax. Improved with CT placement Post-obstructive PNA COPD Stage IV NSCLC w/ extensive mets to bone Evident mets to pleural space  Plan -cont Chest tube to -20 cm H20  , Consider valve -serial CXR -cont  (zosyn) -cont Tarceva per Dr Asa Lente plans. Note that this may preclude him from getting hospice care -ok to resume palliative XRT to spine   Hypertension  -continue outpt meds   GERD (gastroesophageal reflux disease)  -place on PPI   Anemia  -stable, monitor and further manage as appropriate   Summary: Unable to remove CCT on 10/27 due to air leak. Consider valve for chest tube if  this is desirable to facilitate discharge to home.     Brett Canales Minor ACNP [facility] Pager [phone] till 3 pm If no answer page [phone] 01/02/2013, 1:01 PM   Levy Pupa, MD, PhD 01/02/2013, 1:16 PM [facility] [phone] or if no answer [phone]

## 2013-01-02 NOTE — Consult Note (Signed)
Reason for Consult:Interventional pain management Referring Physician: Dr. Anderson Malta  Joel Gibson is an 79 y.o. male.   HPI: Very pleasant gentleman encountered in his room, with wife, daughter and granddaughter present. All welcome by patient and stayed for history-taking, physical exam and discussion.  Patient was admitted 12/20/2012 with onset of back but more dominantly right leg pain, and as the patient reports to me, right hemiparesis. Notes on admission and subsequently  all indicate right leg weakness, no evidence of right upper extremity involvement. By history from the patient's family, he had been having progressive weakness for about a week, and had fallen once prior to admission.  Neurology was consulted, and as I have reviewed their evaluation, hip flexor strength was notably normal or close to normal; patient had distal weakness, 0 Achilles reflex, no strength on dorsi- or plantarflexion.    MRI of the brain indicates no change in frontal metastatic lesion. Personal review of lumbar imaging indicates substantial tumor degradation of L3 vertebral body with compression fracture, no retropulsion of bone into canal or foramina, right foraminal and lateral recess tumor burden. Substantial tumor burden in right psoas adjacent. Additional tumor burden in sacrum, periaortic lymphatics, thoracic spine, rib.   The patient has been treated with decadron, and Dr. Basilio Cairo has initiated fractionated RTx L2-L5, and the patient has, as of today, had 4 of 10 fractions. He is also being treated for hydropneumothorax with right chest tube placement, and his other medical issues.  He has reported pain that radiates from his hip into his thigh, and I was asked to see him regarding possible intervention to improve what appeared to be radicular pain.   Today on presentation, the patient describes:  a) no back pain; he reports weakness in his leg that inhibits his walking, but is able to stand  briefly, and to sit in a chair for hours without axial or mechanical back pain.  b) thigh weakness c) very occasional pain that will radiate from both hips--the patient indicates the area just inferior to his ASIS--right substantially more than left, into the thigh down to the knee "and back". The pain inconsistent, but bothers him more when he tries to sit up in bed, or lift his right leg. He does not get this radiating pain when moving, or twisting while he is sitting up.    Past Medical History  Diagnosis Date  . Hyperlipidemia   . Hypertension   . Aortic stenosis     mild to moderate  . Gastritis   . COPD (chronic obstructive pulmonary disease)   . Bronchitis   . Hepatitis     possible  . Hx of cataract surgery     lt eye  . Tobacco abuse   . Shortness of breath     with activity  . Retinal vein occlusion 1990  . GERD (gastroesophageal reflux disease)   . Hemorrhoid   . Aortic stenosis   . Hypertension   . Dyslipidemia   . Glaucoma   . Right rib fracture      Pathological Fracture / 4 weeks ago  . Lung mass     Right Upper Lobe - Lobular Mass  . SOB (shortness of breath)     Mild  . Cough   . Stroke 2002    Right slight difference in vision  . S/P radiation therapy 02/22/12 - 03/07/12    Pelvis/Sacrum/30 Gray/10 Fractions and Left Scapula/Rib/ 8 Gray/1 Fraction   . Status post chemotherapy  Carboplatin/ Abraxane  . Status post chemotherapy Stopped 10/03/12    Gemcitabine  - Progressed  . Lung cancer 02/02/2012  . Bone metastases     Widespread/ Lytic Lesion Left 7t Rib, Left Iliac Pelvis, ribs and scapula  . S/P radiation therapy 10/21/12    SRS Brain / Left Frontal / 20 Gy  . Status post chemotherapy     Docetaxel with Neulasta Support - Stopped due to Intolerance and Persistent Thrombocytopenia   . On antineoplastic chemotherapy     Tarceva daily / Xgeva Q 4 weeks  . Right sided weakness 31-Dec-2012  . Hx of fall 12/31/12  . Health care home, active care  coordination December 31, 2012  . Pneumothorax     Right Hydro-Pneumothorax  . Encounter for chest tube placement 12/30/13    Past Surgical History  Procedure Laterality Date  . Vasectomy    . Tonsillectomy    . Leg surgery      rt for nerve impingement  . Collarbone fracture      1977  . Tonsillectomy    . Eye surgery      catarct bil  . Portacath placement  02/08/2012    Procedure: INSERTION PORT-A-CATH;  Surgeon: [name], MD;  Location: [facility] OR;  Service: Thoracic;  Laterality: Right;  . Ct guided core biopsy  01/26/12    Left Lytic Lesion - Metastatic Squamous Cell Carcinoma  . Skin biopsy  02/09/12    Right Upper Back, shave : Melanoma In Situ Arising  From a Dysplastic Nevus    Family History  Problem Relation Age of Onset  . Heart attack Father     x7  . Throat cancer Father     Deceased from Staph Infection  . Heart attack Father     7 Times  . Leukemia Mother     Social History:  reports that he quit smoking about a year ago. His smoking use included Cigarettes. He has a 130 pack-year smoking history. He has never used smokeless tobacco. He reports that he drinks alcohol. He reports that he does not use illicit drugs.  Allergies: No Known Allergies  Medications: I have reviewed the patient's current medications.  Results for orders placed during the hospital encounter of 12-31-2012 (from the past 48 hour(s))  PROCALCITONIN     Status: None   Collection Time    01/01/13  4:30 AM      Result Value Range   Procalcitonin 0.10     Comment:            Interpretation:     PCT (Procalcitonin) <= 0.5 ng/mL:     Systemic infection (sepsis) is not likely.     Local bacterial infection is possible.     (NOTE)             ICU PCT Algorithm               Non ICU PCT Algorithm        ----------------------------     ------------------------------             PCT < 0.25 ng/mL                 PCT < 0.1 ng/mL         Stopping of antibiotics            Stopping of antibiotics            strongly encouraged.  strongly encouraged.        ----------------------------     ------------------------------           PCT level decrease by               PCT < 0.25 ng/mL           >= 80% from peak PCT           OR PCT 0.25 - 0.5 ng/mL          Stopping of antibiotics                                                 encouraged.         Stopping of antibiotics               encouraged.        ----------------------------     ------------------------------           PCT level decrease by              PCT >= 0.25 ng/mL           < 80% from peak PCT            AND PCT >= 0.5 ng/mL            Continuing antibiotics                                                  encouraged.           Continuing antibiotics                encouraged.        ----------------------------     ------------------------------         PCT level increase compared          PCT > 0.5 ng/mL             with peak PCT AND              PCT >= 0.5 ng/mL             Escalation of antibiotics                                              strongly encouraged.          Escalation of antibiotics            strongly encouraged.  BASIC METABOLIC PANEL     Status: Abnormal   Collection Time    01/01/13  4:30 AM      Result Value Range   Sodium 132 (*) 135 - 145 mEq/L   Potassium 3.8  3.5 - 5.1 mEq/L   Chloride 101  96 - 112 mEq/L   CO2 23  19 - 32 mEq/L   Glucose, Bld 127 (*) 70 - 99 mg/dL   BUN 29 (*) 6 - 23 mg/dL   Creatinine, Ser 1.61  0.50 - 1.35 mg/dL   Calcium 9.4  8.4 - 09.6 mg/dL   GFR calc non Af Amer 77 (*) >  90 mL/min   GFR calc Af Amer 90 (*) >90 mL/min   Comment: (NOTE)     The eGFR has been calculated using the CKD EPI equation.     This calculation has not been validated in all clinical situations.     eGFR's persistently <90 mL/min signify possible Chronic Kidney     Disease.  CBC     Status: Abnormal   Collection Time    01/01/13  4:30 AM      Result Value Range   WBC 10.7  (*) 4.0 - 10.5 K/uL   RBC 2.67 (*) 4.22 - 5.81 MIL/uL   Hemoglobin 8.4 (*) 13.0 - 17.0 g/dL   HCT 16.1 (*) 09.6 - 04.5 %   MCV 100.0  78.0 - 100.0 fL   MCH 31.5  26.0 - 34.0 pg   MCHC 31.5  30.0 - 36.0 g/dL   RDW 40.9 (*) 81.1 - 91.4 %   Platelets 190  150 - 400 K/uL  CBC     Status: Abnormal   Collection Time    01/02/13  6:13 AM      Result Value Range   WBC 13.0 (*) 4.0 - 10.5 K/uL   RBC 2.70 (*) 4.22 - 5.81 MIL/uL   Hemoglobin 8.6 (*) 13.0 - 17.0 g/dL   HCT 78.2 (*) 95.6 - 21.3 %   MCV 101.1 (*) 78.0 - 100.0 fL   MCH 31.9  26.0 - 34.0 pg   MCHC 31.5  30.0 - 36.0 g/dL   RDW 08.6 (*) 57.8 - 46.9 %   Platelets 202  150 - 400 K/uL  BASIC METABOLIC PANEL     Status: Abnormal   Collection Time    01/02/13  6:13 AM      Result Value Range   Sodium 136  135 - 145 mEq/L   Potassium 4.1  3.5 - 5.1 mEq/L   Chloride 104  96 - 112 mEq/L   CO2 22  19 - 32 mEq/L   Glucose, Bld 119 (*) 70 - 99 mg/dL   BUN 30 (*) 6 - 23 mg/dL   Creatinine, Ser 6.29  0.50 - 1.35 mg/dL   Calcium 9.8  8.4 - 52.8 mg/dL   GFR calc non Af Amer 67 (*) >90 mL/min   GFR calc Af Amer 78 (*) >90 mL/min   Comment: (NOTE)     The eGFR has been calculated using the CKD EPI equation.     This calculation has not been validated in all clinical situations.     eGFR's persistently <90 mL/min signify possible Chronic Kidney     Disease.   CURRENT MEDICATIONS IN HOSPITAL:  . amLODipine  2.5 mg Oral Daily  . aspirin EC  81 mg Oral QODAY  . atorvastatin  20 mg Oral q1800  . betaxolol  1 drop Both Eyes Daily  . betaxolol  1 drop Right Eye QHS  . dexamethasone  4 mg Oral Q8H  . dipyridamole-aspirin  1 capsule Oral QHS  . dipyridamole-aspirin  1 capsule Oral QODAY  . erlotinib  150 mg Oral Daily  . irbesartan  300 mg Oral Daily  . levalbuterol  0.63 mg Nebulization Q6H WA  . loratadine  10 mg Oral Daily  . morphine  30 mg Oral Q12H  . multivitamin with minerals  1 tablet Oral QHS  . pantoprazole  40 mg Oral BID  AC  . piperacillin-tazobactam (ZOSYN)  IV  3.375 g Intravenous Q8H  . tiotropium  18 mcg Inhalation Daily   ALLERGIES: No Known Allergies   Review of Systems  Constitutional: Negative.   Eyes: Negative.   Respiratory: Positive for cough and shortness of breath.   Cardiovascular: Negative.   Musculoskeletal: Positive for falls.  Skin: Negative.   Neurological: Positive for sensory change and focal weakness.  Psychiatric/Behavioral: Negative.    Blood pressure 99/49, pulse 69, temperature 98.1 F (36.7 C), temperature source Oral, resp. rate 16, height 5\' 7"  (1.702 m), weight 66.7 kg (147 lb 0.8 oz), SpO2 99.00%. Physical Exam  Constitutional: He is oriented to person, place, and time. He appears well-developed and well-nourished.  HENT:  Head: Normocephalic and atraumatic.  Eyes: Conjunctivae and EOM are normal. Pupils are equal, round, and reactive to light.  Neck: Normal range of motion.  Cardiovascular: Normal rate and regular rhythm.  Exam reveals no friction rub.   No murmur heard. Respiratory: Effort normal.  Right chest tube. Normal left sided breath sounds. RUL with expiratory wheeze, RLL no detectable BS.   Neurological: He is alert and oriented to person, place, and time. No cranial nerve deficit.  Skin: Skin is warm and dry.  Psychiatric: He has a normal mood and affect. His behavior is normal. Judgment and thought content normal.  Extremity/Musculoskeletal/Neuro:3+/5 quad strength on leg raise, 3+ adductor with leg straight. 2-/5 adductor/hip flexor with knee bent. 5/5 hip abductor.  Lumbar spine: mild tenderness over L3 spinous process. Patient sits without lumbar axial /mechanical pain.    Assessment/Plan: ASSESSMENT: By patient history today, he does not seem to have radiculopathy, but is bothered by weakness more. Physical exam is most notable for flexor weakness, adductor weakness; quad weakness is present but less so. Distal neuro signs are not present as they  were 6 days ago when evaluated by the neurology service.   While right L3 foraminal tumor burden may play a contributing role, psoas tumor is as or more likely to be dominating the patients hip flexor and proximal thigh weakness. These two areas are being addressed by Dr. Basilio Cairo currently with RTx, and the patient has received 4 of 10 planned fractions.   PLAN: discussed options with patient and family. At this point, I would follow along as the patient continues his radiation therapy. If the patient continues to improve, I would not intervene. If he develops a more focal radiculopathy, or other pain, I would consider right L3 TFESI. He would need to stop Aggrenox for 30 hours prior to the procedure.  At this time the patient does not exhibit axial/mechanical back pain, and I would follow his compression fracture expectantly as well.   I will plan on checking with the patient in 2-3 days. He has my card. I have spoken with and conveyed my impressions to Dr. Phillips Odor.   Gwynne Edinger 01/02/2013, 5:41 PM

## 2013-01-02 NOTE — Progress Notes (Signed)
CSW met with pt and pt wife at bedside to discuss disposition planning.  Pt and pt wife stated that MD does not yet feel that pt is medically ready for discharge, but pt and pt wife are planning for pt to go to [facility] when pt medically stable.  CSW spoke with [facility] who stated that pt family had contacted facility. Per [facility], facility can accept when pt medically ready for discharge.  CSW to continue to follow to assist with pt discharge needs when pt medically stable for discharge.  Jacklynn Lewis, MSW, LCSWA  Clinical Social Work Coverage for [employer] [phone]

## 2013-01-02 NOTE — Progress Notes (Signed)
Physical Therapy Treatment Patient Details Name: Joel Gibson MRN: [id number] DOB: 1931-06-29 Today's Date: 01/02/2013 Time: 4098-1191 PT Time Calculation (min): 19 min  PT Assessment / Plan / Recommendation  History of Present Illness Pt is a 79 y.o. male with multiple medical problems including h/o Lung ca diagnosed in 2013 (and followed by Dr Arbutus Ped) metastatic to brain and and bones and s/p stereotactic radiotherapy (followed by Dr Basilio Cairo), CVA, HTN, He states that he developed pain from his hip area down to knee and R. Leg weakness 3-4days prior to admission and it has worsened to where he is unable to move that leg. Per daughter he has had some weakness for the past 30mo   head CT showed- stable appearance of left for frontal lobe mass with surrounding vasogenic edema and no new hemorrhage.Pt has a L3 burst fx (no brace needed per MD) and tumor which is being treated by xrt.   PT Comments   Pt tolerated mobility to recliner, appears improved R dorsiflexion, less pain in R thigh. 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   Pertinent Vitals/Pain R hip to knee- had medication    Mobility  Bed Mobility Bed Mobility: Rolling Right;Right Sidelying to Sit;Sitting - Scoot to Edge of Bed Rolling Right: 4: Min guard Right Sidelying to Sit: 4: Min guard;With rails Sitting - Scoot to Edge of Bed: 4: Min guard Details for Bed  Mobility Assistance: verbal cues for technique Transfers Sit to Stand: 4: Min assist;With upper extremity assist;From bed;From chair/3-in-1 Stand to Sit: 4: Min assist;With upper extremity assist;To bed;To chair/3-in-1 Details for Transfer Assistance: Step by step instruction  Ambulation/Gait Ambulation/Gait Assistance: 1: +2 Total assist Ambulation/Gait: Patient Percentage: 60% Ambulation Distance (Feet): 5 Feet Assistive device: Rolling walker Ambulation/Gait Assistance Details: pt was able to take sliding steps with R leg  to get to recliner. Gait Pattern: Right steppage;Step-to pattern;Decreased step length - right;Decreased weight shift to right    Exercises General Exercises - Lower Extremity Ankle Circles/Pumps: AROM;10 reps Quad Sets: AROM;Both;10 reps Long Arc Quad: AAROM;AROM;Both;10 reps Hip Flexion/Marching: AAROM;Both;10 reps;AROM   PT Diagnosis:    PT Problem List:   PT Treatment Interventions:     PT Goals (current goals can now be found in the care plan section) Acute Rehab PT Goals Patient Stated Goal: to keep improving  Visit Information  Last PT Received On: 01/02/13 Assistance Needed: +2 .    Subjective Data  Patient Stated Goal: to keep improving   Cognition  Cognition Arousal/Alertness: Awake/alert Behavior During Therapy: WFL for tasks assessed/performed Overall Cognitive Status: History of cognitive impairments - at baseline Memory: Decreased short-term memory    Balance  Balance Balance Assessed: Yes Dynamic Standing Balance Dynamic Standing - Balance Support: Bilateral upper extremity supported Dynamic Standing - Level of Assistance: 4: Min assist Dynamic Standing - Balance Activities:  (toileting)  End of Session PT - End of Session Activity Tolerance: Patient tolerated  treatment well Patient left: in chair;with call bell/phone within reach;with family/visitor present Nurse Communication: Mobility status;Other (comment)   GP      Rada Hay 01/02/2013, 4:18 PM

## 2013-01-02 NOTE — Progress Notes (Signed)
   Weekly Management Note:  inpatient Current Dose: 12 Gy  Projected Dose: 30 Gy   Narrative:  The patient presents for routine under treatment assessment.  CBCT/MVCT images/Port film x-rays were reviewed.  The chart was checked. Had chest tube placed for hydropneumothorax.  Pain is better controlled, possibly due to medications.  Still weak in RLE. No acute complaints related to radiotherapy. Engaged in discussions with palliative medicine.  Physical Findings:  height is 5\' 7"  (1.702 m) and weight is 147 lb 0.8 oz (66.7 kg). His oral temperature is 98.1 F (36.7 C). His blood pressure is 99/49 and his pulse is 69. His respiration is 16 and oxygen saturation is 99%.  Continued RLE weakness notable.  CBC    Component Value Date/Time   WBC 13.0* 01/02/2013 0613   WBC 8.3 12/05/2012 0827   RBC 2.70* 01/02/2013 0613   RBC 2.62* 12/05/2012 0827   HGB 8.6* 01/02/2013 0613   HGB 8.4* 12/05/2012 0827   HCT 27.3* 01/02/2013 0613   HCT 27.0* 12/05/2012 0827   PLT 202 01/02/2013 0613   PLT 76* 12/05/2012 0827   MCV 101.1* 01/02/2013 0613   MCV 103.1* 12/05/2012 0827   MCH 31.9 01/02/2013 0613   MCH 32.1 12/05/2012 0827   MCHC 31.5 01/02/2013 0613   MCHC 31.1* 12/05/2012 0827   RDW 18.7* 01/02/2013 0613   RDW 16.6* 12/05/2012 0827   LYMPHSABS 0.8 01/02/2013 1345   LYMPHSABS 0.8* 12/05/2012 0827   MONOABS 1.1* 12/10/2012 1345   MONOABS 0.3 12/05/2012 0827   EOSABS 0.0 12/30/2012 1345   EOSABS 0.0 12/05/2012 0827   BASOSABS 0.0 01/04/2013 1345   BASOSABS 0.0 12/05/2012 0827     CMP     Component Value Date/Time   NA 136 01/02/2013 0613   NA 145 11/28/2012 0828   K 4.1 01/02/2013 0613   K 3.7 11/28/2012 0828   CL 104 01/02/2013 0613   CL 108* 08/29/2012 1100   CO2 22 01/02/2013 0613   CO2 26 11/28/2012 0828   GLUCOSE 119* 01/02/2013 0613   GLUCOSE 115 11/28/2012 0828   GLUCOSE 117* 08/29/2012 1100   BUN 30* 01/02/2013 0613   BUN 16.9 11/28/2012 0828   CREATININE 1.03 01/02/2013 0613   CREATININE 0.9 11/28/2012 0828   CALCIUM 9.8 01/02/2013 0613   CALCIUM 8.6 11/28/2012 0828   PROT 5.5* 12/29/2012 0530   PROT 5.9* 11/28/2012 0828   ALBUMIN 2.3* 12/29/2012 0530   ALBUMIN 2.5* 11/28/2012 0828   AST 48* 12/29/2012 0530   AST 23 11/28/2012 0828   ALT 40 12/29/2012 0530   ALT 29 11/28/2012 0828   ALKPHOS 125* 12/29/2012 0530   ALKPHOS 61 11/28/2012 0828   BILITOT 0.4 12/29/2012 0530   BILITOT 0.70 11/28/2012 0828   GFRNONAA 67* 01/02/2013 0613   GFRAA 78* 01/02/2013 1610     Impression:  The patient is tolerating radiotherapy.   Plan:  Continue radiotherapy as planned.   -----------------------------------  Lonie Peak, MD

## 2013-01-02 NOTE — Progress Notes (Signed)
ANTIBIOTIC CONSULT NOTE   Pharmacy Consult for Zosyn Indication: Postobstructive PNA  No Known Allergies  Patient Measurements: Height: 5\' 7"  (170.2 cm) Weight: 147 lb 0.8 oz (66.7 kg) IBW/kg (Calculated) : 66.1  Vital Signs: Temp: 97.7 F (36.5 C) (10/27 0708) Temp src: Axillary (10/27 0708) BP: 115/64 mmHg (10/27 0708) Pulse Rate: 59 (10/27 0708) Intake/Output from previous day: 10/26 0701 - 10/27 0700 In: 905 [P.O.:720; IV Piggyback:125] Out: 450 [Urine:300; Chest Tube:150] Intake/Output from this shift:    Labs:  Recent Labs  12/31/12 0730 01/01/13 0430 01/02/13 0613  WBC 11.9* 10.7* 13.0*  HGB 9.0* 8.4* 8.6*  PLT 211 190 202  CREATININE 0.84 0.94 1.03   Estimated Creatinine Clearance: 54.4 ml/min (by C-G formula based on Cr of 1.03).   Microbiology: Recent Results (from the past 720 hour(s))  RAPID STREP SCREEN     Status: None   Collection Time    12/04/12 12:30 PM      Result Value Range Status   Streptococcus, Group A Screen (Direct) NEGATIVE  NEGATIVE Final   Comment: (NOTE)     A Rapid Antigen test may result negative if the antigen level in the     sample is below the detection level of this test. The FDA has not     cleared this test as a stand-alone test therefore the rapid antigen     negative result has reflexed to a Group A Strep culture.  CULTURE, GROUP A STREP     Status: None   Collection Time    12/04/12 12:30 PM      Result Value Range Status   Specimen Description THROAT   Final   Special Requests NONE   Final   Culture     Final   Value: No Beta Hemolytic Streptococci Isolated     Performed at [facility]   Report Status 12/06/2012 FINAL   Final  MRSA PCR SCREENING     Status: None   Collection Time    12/30/12  1:16 PM      Result Value Range Status   MRSA by PCR NEGATIVE  NEGATIVE Final   Comment:            The GeneXpert MRSA Assay (FDA     approved for NASAL specimens     only), is one component of a   comprehensive MRSA colonization     surveillance program. It is not     intended to diagnose MRSA     infection nor to guide or     monitor treatment for     MRSA infections.    Medical History: Past Medical History  Diagnosis Date  . Hyperlipidemia   . Hypertension   . Aortic stenosis     mild to moderate  . Gastritis   . COPD (chronic obstructive pulmonary disease)   . Bronchitis   . Hepatitis     possible  . Hx of cataract surgery     lt eye  . Tobacco abuse   . Shortness of breath     with activity  . Retinal vein occlusion 1990  . GERD (gastroesophageal reflux disease)   . Hemorrhoid   . Aortic stenosis   . Hypertension   . Dyslipidemia   . Glaucoma   . Right rib fracture      Pathological Fracture / 4 weeks ago  . Lung mass     Right Upper Lobe - Lobular Mass  . SOB (shortness of  breath)     Mild  . Cough   . Stroke 2002    Right slight difference in vision  . S/P radiation therapy 02/22/12 - 03/07/12    Pelvis/Sacrum/30 Gray/10 Fractions and Left Scapula/Rib/ 8 Gray/1 Fraction   . Status post chemotherapy      Carboplatin/ Abraxane  . Status post chemotherapy Stopped 10/03/12    Gemcitabine  - Progressed  . Lung cancer 02/02/2012  . Bone metastases     Widespread/ Lytic Lesion Left 7t Rib, Left Iliac Pelvis, ribs and scapula  . S/P radiation therapy 10/21/12    SRS Brain / Left Frontal / 20 Gy  . Status post chemotherapy     Docetaxel with Neulasta Support - Stopped due to Intolerance and Persistent Thrombocytopenia   . On antineoplastic chemotherapy     Tarceva daily / Xgeva Q 4 weeks  . Right sided weakness January 14, 2013  . Hx of fall 14-Jan-2013  . Health care home, active care coordination 01/14/13  . Pneumothorax     Right Hydro-Pneumothorax  . Encounter for chest tube placement 12/30/13    Medications:  Scheduled:  . amLODipine  2.5 mg Oral Daily  . aspirin EC  81 mg Oral QODAY  . atorvastatin  20 mg Oral q1800  . betaxolol  1 drop Both Eyes  Daily  . betaxolol  1 drop Right Eye QHS  . dexamethasone  4 mg Oral Q8H  . dipyridamole-aspirin  1 capsule Oral QHS  . dipyridamole-aspirin  1 capsule Oral QODAY  . erlotinib  150 mg Oral Daily  . irbesartan  300 mg Oral Daily  . levalbuterol  0.63 mg Nebulization Q6H WA  . loratadine  10 mg Oral Daily  . morphine  30 mg Oral Q12H  . multivitamin with minerals  1 tablet Oral QHS  . pantoprazole  40 mg Oral BID AC  . piperacillin-tazobactam (ZOSYN)  IV  3.375 g Intravenous Q8H  . tiotropium  18 mcg Inhalation Daily   Infusions:   Assessment: 79 year old male w/ NSCLC stage IV with mets to brain, spine and pelvis. Admitted on 10/21 with new right LE weakness. Found to have new pathological lumbar fracture. Began palliative XRT on 10/22. Moved to SDU on 10/24 after developing spontaneous right pneumothorax/ hydropneumothorax. Beginning Zosyn per Pharmacy protocol for presumed postobstructive pneumonia.  10/21 >> Diflucan >> 10/23 10/24 >> Zosyn >>  Tmax: Afeb WBC= 13 (on decadron) Renal: Scr=1.03, CrCl 91ml/min  (no cultures)   Goal of Therapy:  Eradication of infection Dose per renal function  Plan:  Day #4 Zosyn Zosyn 3.375gm IV q8h (4hr extended infusions) Follow up renal function & cultures  Juliette Alcide, PharmD, BCPS.   Pager: [phone] 01/02/2013,10:34 AM

## 2013-01-02 NOTE — Progress Notes (Signed)
Clinical Social Work Department CLINICAL SOCIAL WORK PLACEMENT NOTE 01/02/2013  Patient:  Joel Gibson, Joel Gibson  Account Number:  [id number] Admit date:  2012-12-29  Clinical Social Worker:  Jacelyn Grip  Date/time:  01/02/2013 04:00 PM  Clinical Social Work is seeking post-discharge placement for this patient at the following level of care:   SKILLED NURSING   (*CSW will update this form in Epic as items are completed)   12/29/2012  Patient/family provided with [facility] Department of Clinical Social Work's list of facilities offering this level of care within the geographic area requested by the patient (or if unable, by the patient's family).  12/29/2012  Patient/family informed of their freedom to choose among providers that offer the needed level of care, that participate in Medicare, Medicaid or managed care program needed by the patient, have an available bed and are willing to accept the patient.  12/29/2012  Patient/family informed of [facility]' ownership interest in [facility], as well as of the fact that they are under no obligation to receive care at this facility.  PASARR submitted to EDS on 12/29/2012 PASARR number received from EDS on 12/29/2012  FL2 transmitted to all facilities in geographic area requested by pt/family on  12/29/2012 FL2 transmitted to all facilities within larger geographic area on   Patient informed that his/her managed care company has contracts with or will negotiate with  certain facilities, including the following:     Patient/family informed of bed offers received:  12/30/2012 Patient chooses bed at [facility] Physician recommends and patient chooses bed at    Patient to be transferred to [facility] on   Patient to be transferred to facility by   The following physician request were entered in Epic:   Additional Comments:  Jacklynn Lewis, MSW, LCSWA  Clinical Social  Work [phone]

## 2013-01-02 NOTE — Progress Notes (Signed)
[facility] PROGRESS NOTE  INMER NIX NFA:[id number] DOB: [birth year] DOA: 01-17-13 PCP: [name], MD  Assessment/Plan:  Hydropneumothorax/Post obstructive pnuemonia -greatly appreciate PCCM consult -s/p Emergent chest tube 10/24,  -Duration of chest tube per Pulm -Day 4 of IV Zosyn -Repeat CXR improved  L3 Pathologic burst fracture with some retropulsion of bone and extensive epidural tumor as per MRI -Right leg weakness/Pain -continue decadron, starting to improve with Steroids -Rad Onc/Dr Basilio Cairo following, started XRT10/22 for 10treatments total -CT head with vasogenic edema, will continue with decadron q6, changed to PO -PT/OT  Following, SNF recommended -increased MSContin for pain  . Hypercalcemia  -secondary to malignancy/bone mets  -trending down on IVF, steroids   -improved  . Extensive Metastases/Stage 4 non small cell Lung cancer  - Dr Arbutus Ped and Rad Onc following - prognosis appears rather poor - on Tarceva and getting Palliative XRT -s/p Palliative meeting 10/26, greatly appreciate Dr.Golding's assistance  . Hypertension  -continue outpt meds   . GERD (gastroesophageal reflux disease)  -place on PPI   . COPD (chronic obstructive pulmonary disease)  - continue outpt meds, add prn nebs   . Anemia of chronic disease  -stable, monitor   Constipation -now with freq BMs -cut down Miralax and stop senokot  Global: Given Advanced stage Lung CA with Mets to Brain, spine, liver , progressive disease on CTA chest, s/p Palliative consult for Goals of care  Code Status: full Family Communication: wife at bedside  Disposition Plan: SNF pending Chest tube removal   Consultants:  Neuro  Radiation -onc  onc   Pulm  Procedures:  Chest tube 10/24  Antibiotics:  Zosyn 10/24  HPI/Subjective: Breathing much better, R Hip pain is moderate today, RLE weakness unchanged from yesterday, multiple BMS yesterday and  today  Objective: Filed Vitals:   01/02/13 0708  BP: 115/64  Pulse: 59  Temp: 97.7 F (36.5 C)  Resp: 16    Intake/Output Summary (Last 24 hours) at 01/02/13 0753 Last data filed at 01/02/13 0415  Gross per 24 hour  Intake    905 ml  Output    450 ml  Net    455 ml   Filed Weights   01-17-2013 1712 12/31/12 0400 01/01/13 0400  Weight: 66 kg (145 lb 8.1 oz) 67.7 kg (149 lb 4 oz) 66.7 kg (147 lb 0.8 oz)    Exam:  General: alert & oriented x 3, mild distress Cardiovascular: RRR, nl S1 s2 Respiratory: decreased BS at bases, occasional ronchi Abdomen: soft +BS NT/ND, no masses palpable Extremities: No cyanosis and no edema, RLE foot strength 3/5 and rest of R.Leg 4-5/5, L side strength 5/5     Data Reviewed: Basic Metabolic Panel:  Recent Labs Lab 12/28/12 0516 12/29/12 0530 12/31/12 0730 01/01/13 0430 01/02/13 0613  NA 136 137 136 132* 136  K 4.1 4.2 4.1 3.8 4.1  CL 106 108 103 101 104  CO2 21 19 23 23 22   GLUCOSE 133* 115* 112* 127* 119*  BUN 21 19 21  29* 30*  CREATININE 0.87 0.80 0.84 0.94 1.03  CALCIUM 10.5 10.0 10.0 9.4 9.8   Liver Function Tests:  Recent Labs Lab 01/17/13 1345 12/29/12 0530  AST 46* 48*  ALT 26 40  ALKPHOS 117 125*  BILITOT 0.6 0.4  PROT 6.0 5.5*  ALBUMIN 2.4* 2.3*   No results found for this basename: LIPASE, AMYLASE,  in the last 168 hours No results found for this basename: AMMONIA,  in the last 168 hours  CBC:  Recent Labs Lab 01/18/2013 1345 12/27/12 0645 12/29/12 0530 12/31/12 0730 01/01/13 0430 01/02/13 0613  WBC 9.2 7.2 11.4* 11.9* 10.7* 13.0*  NEUTROABS 7.3  --   --   --   --   --   HGB 8.7* 9.1* 8.9* 9.0* 8.4* 8.6*  HCT 27.8* 28.2* 27.6* 28.8* 26.7* 27.3*  MCV 102.2* 101.4* 99.6 100.3* 100.0 101.1*  PLT 197 198 198 211 190 202   Cardiac Enzymes: No results found for this basename: CKTOTAL, CKMB, CKMBINDEX, TROPONINI,  in the last 168 hours BNP (last 3 results) No results found for this basename: PROBNP,  in  the last 8760 hours CBG: No results found for this basename: GLUCAP,  in the last 168 hours  Recent Results (from the past 240 hour(s))  MRSA PCR SCREENING     Status: None   Collection Time    12/30/12  1:16 PM      Result Value Range Status   MRSA by PCR NEGATIVE  NEGATIVE Final   Comment:            The GeneXpert MRSA Assay (FDA     approved for NASAL specimens     only), is one component of a     comprehensive MRSA colonization     surveillance program. It is not     intended to diagnose MRSA     infection nor to guide or     monitor treatment for     MRSA infections.     Studies: No results found.  Scheduled Meds: . amLODipine  2.5 mg Oral Daily  . aspirin EC  81 mg Oral QODAY  . atorvastatin  20 mg Oral q1800  . betaxolol  1 drop Both Eyes Daily  . betaxolol  1 drop Right Eye QHS  . dexamethasone  4 mg Oral Q8H  . dipyridamole-aspirin  1 capsule Oral QHS  . dipyridamole-aspirin  1 capsule Oral QODAY  . erlotinib  150 mg Oral Daily  . irbesartan  300 mg Oral Daily  . levalbuterol  0.63 mg Nebulization Q6H WA  . loratadine  10 mg Oral Daily  . morphine  30 mg Oral Q12H  . multivitamin with minerals  1 tablet Oral QHS  . pantoprazole  40 mg Oral BID AC  . piperacillin-tazobactam (ZOSYN)  IV  3.375 g Intravenous Q8H  . tiotropium  18 mcg Inhalation Daily   Continuous Infusions:    Principal Problem:   Hydropneumothorax Active Problems:   Hypertension   Lung cancer   COPD (chronic obstructive pulmonary disease)   GERD (gastroesophageal reflux disease)   Bone metastases   Anemia of chronic disease   Right leg weakness   Hypercalcemia   Acute lumbar radiculopathy   Metastasis from malignant tumor of lung   Acute respiratory distress   Encounter for chest tube placement    Time spent: 25    [facility]  [facility] Pager [phone]. If 7PM-7AM, please contact night-coverage at [email or web address], password [facility] 01/02/2013, 7:53 AM  LOS: 7 days

## 2013-01-03 ENCOUNTER — Ambulatory Visit: Payer: Medicare Other

## 2013-01-03 ENCOUNTER — Inpatient Hospital Stay (HOSPITAL_COMMUNITY): Payer: Medicare Other

## 2013-01-03 ENCOUNTER — Ambulatory Visit
Admit: 2013-01-03 | Discharge: 2013-01-03 | Disposition: A | Discharge status: Home / Self Care | Payer: Medicare Other | Attending: Radiation Oncology | Admitting: Radiation Oncology

## 2013-01-03 ENCOUNTER — Encounter: Payer: Self-pay | Admitting: Internal Medicine

## 2013-01-03 DIAGNOSIS — Z515 Encounter for palliative care: Secondary | ICD-10-CM

## 2013-01-03 DIAGNOSIS — C801 Malignant (primary) neoplasm, unspecified: Secondary | ICD-10-CM

## 2013-01-03 DIAGNOSIS — C3490 Malignant neoplasm of unspecified part of unspecified bronchus or lung: Secondary | ICD-10-CM

## 2013-01-03 LAB — CBC
HCT: 24.2 % — ABNORMAL LOW (ref 39.0–52.0)
Platelets: 168 10*3/uL (ref 150–400)
RDW: 18.9 % — ABNORMAL HIGH (ref 11.5–15.5)
WBC: 10.4 10*3/uL (ref 4.0–10.5)

## 2013-01-03 LAB — BASIC METABOLIC PANEL
BUN: 32 mg/dL — ABNORMAL HIGH (ref 6–23)
Chloride: 103 mEq/L (ref 96–112)
Creatinine, Ser: 1.14 mg/dL (ref 0.50–1.35)
GFR calc Af Amer: 69 mL/min — ABNORMAL LOW (ref >90)
GFR calc non Af Amer: 59 mL/min — ABNORMAL LOW (ref >90)
Potassium: 4.1 mEq/L (ref 3.5–5.1)
Sodium: 136 mEq/L (ref 135–145)

## 2013-01-03 LAB — PROCALCITONIN: Procalcitonin: 0.14 ng/mL

## 2013-01-03 MED ORDER — SODIUM CHLORIDE 0.9 % IJ SOLN
10.0000 mL | INTRAMUSCULAR | Status: DC | PRN
  Administered 2013-01-07 – 2013-01-08 (×3): 10 mL

## 2013-01-03 NOTE — Progress Notes (Signed)
[facility] PROGRESS NOTE  Joel Gibson AOZ:[id number] DOB: December 27, 1931 DOA: 01/17/2013 PCP: [name], MD Brief Narrative  Joel Gibson is a 79 y.o. 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 Subsequently developed resp distress and was found to have Hydropneumothorax in R lung, then was seen by Pulm and underwent emergent Chest tube placement, with improvement. Was also seen by palliative medicine, now improving, see details below.   Assessment/Plan:  Hydropneumothorax/Post obstructive pnuemonia -greatly appreciate PCCM consult -s/p Emergent chest tube 10/24, -clinically improved  -Duration of chest tube per Pulm, still with airleak -Day 5 of IV Zosyn, change to PO abx tomorrow -Repeat CXR improved  L3 Pathologic burst fracture with some retropulsion of bone and extensive epidural tumor as per MRI -with Right leg weakness/Pain-improving -continue decadron -Rad Onc/Dr Basilio Cairo following, started XRT10/22 for 10treatments total -CT head with vasogenic edema, will continue with decadron q6 PO -PT/OT  Following, SNF recommended -increased MSContin for pain -Appreciate Dr.Goldings assistance, Seen by Dr.Harkins for interventional pain consult to re-evaluate Wednesday   . Hypercalcemia  -secondary to malignancy/bone mets  -trending down on IVF, steroids   -improved  . Extensive Metastases/Stage 4 non small cell Lung cancer  - Dr Arbutus Ped and Rad Onc following - prognosis appears rather poor - on Tarceva and getting Palliative XRT -s/p Palliative meeting 10/26, greatly appreciate Dr.Golding's assistance -plan for SNF with PCS following, DNR now  . Hypertension  -continue outpt meds   .  GERD (gastroesophageal reflux disease)  -place on PPI   . COPD (chronic obstructive pulmonary disease)  - continue outpt meds, add prn nebs   . Anemia of chronic disease  -stable, monitor   Constipation -now with freq BMs -cut down Miralax and stop senokot  Global: Given Advanced stage Lung CA with Mets to Brain, spine, liver , progressive disease on CTA chest, s/p Palliative consult for Goals of care  Code Status: full Family Communication: wife at bedside  Disposition Plan: SNF pending Chest tube removal   Consultants:  Neuro  Radiation -onc  onc   Pulm  Dr.Paul Harkins  Procedures:  Chest tube 10/24  XRT to Lumbar spine  Antibiotics:  Zosyn 10/24  HPI/Subjective: Feels good today, pain is mild to moderate, Breathing much better Objective: Filed Vitals:   01/03/13 0528  BP: 127/65  Pulse: 102  Temp: 98.2 F (36.8 C)  Resp: 16    Intake/Output Summary (Last 24 hours) at 01/03/13 1245 Last data filed at 01/03/13 0800  Gross per 24 hour  Intake    560 ml  Output    731 ml  Net   -171 ml   Filed Weights   09-Jan-2013 1712 12/31/12 0400 01/01/13 0400  Weight: 66 kg (145 lb 8.1 oz) 67.7 kg (149 lb 4 oz) 66.7 kg (147 lb 0.8 oz)    Exam:  General: alert & oriented x 3, mild distress Cardiovascular: RRR, nl S1 s2 Respiratory: decreased BS at bases, occasional ronchi Abdomen: soft +BS NT/ND, no masses palpable Extremities: No cyanosis and no edema, RLE foot strength 3/5 and rest of R.Leg 4-5/5, L side strength 5/5     Data Reviewed: Basic Metabolic Panel:  Recent Labs Lab 12/29/12 0530  12/31/12 0730 01/01/13 0430 01/02/13 0613 01/03/13 0455  NA 137 136 132* 136 136  K 4.2 4.1 3.8 4.1 4.1  CL 108 103 101 104 103  CO2 19 23 23 22 22   GLUCOSE 115* 112* 127* 119* 104*  BUN 19 21 29* 30* 32*  CREATININE 0.80 0.84 0.94 1.03 1.14  CALCIUM 10.0 10.0 9.4 9.8 9.5   Liver Function Tests:  Recent Labs Lab 12/29/12 0530  AST 48*  ALT 40   ALKPHOS 125*  BILITOT 0.4  PROT 5.5*  ALBUMIN 2.3*   No results found for this basename: LIPASE, AMYLASE,  in the last 168 hours No results found for this basename: AMMONIA,  in the last 168 hours CBC:  Recent Labs Lab 12/29/12 0530 12/31/12 0730 01/01/13 0430 01/02/13 0613 01/03/13 0455  WBC 11.4* 11.9* 10.7* 13.0* 10.4  HGB 8.9* 9.0* 8.4* 8.6* 7.8*  HCT 27.6* 28.8* 26.7* 27.3* 24.2*  MCV 99.6 100.3* 100.0 101.1* 101.7*  PLT 198 211 190 202 168   Cardiac Enzymes: No results found for this basename: CKTOTAL, CKMB, CKMBINDEX, TROPONINI,  in the last 168 hours BNP (last 3 results) No results found for this basename: PROBNP,  in the last 8760 hours CBG: No results found for this basename: GLUCAP,  in the last 168 hours  Recent Results (from the past 240 hour(s))  MRSA PCR SCREENING     Status: None   Collection Time    12/30/12  1:16 PM      Result Value Range Status   MRSA by PCR NEGATIVE  NEGATIVE Final   Comment:            The GeneXpert MRSA Assay (FDA     approved for NASAL specimens     only), is one component of a     comprehensive MRSA colonization     surveillance program. It is not     intended to diagnose MRSA     infection nor to guide or     monitor treatment for     MRSA infections.     Studies: Dg Chest Port 1 View  01/03/2013   CLINICAL DATA:  Pneumothorax  EXAM: PORTABLE CHEST - 1 VIEW  COMPARISON:  12/31/2012  FINDINGS: Right basilar chest tube in stable position. Unchanged positioning of right subclavian porta catheter. Small right apical pneumothorax persists, similar to 3 days prior. There is improved aeration at the right base. Re- demonstrated peritracheal lung mass on the right. Clear left lung. Normal heart size.  IMPRESSION: 1. Small right apical pneumothorax persists. 2. Cleared right pleural effusion and atelectasis.   Electronically Signed   By: Tiburcio Pea M.D.   On: 01/03/2013 05:48    Scheduled Meds: . amLODipine  2.5 mg Oral  Daily  . aspirin EC  81 mg Oral QODAY  . atorvastatin  20 mg Oral q1800  . betaxolol  1 drop Both Eyes Daily  . betaxolol  1 drop Right Eye QHS  . dexamethasone  4 mg Oral Q8H  . dipyridamole-aspirin  1 capsule Oral QHS  . dipyridamole-aspirin  1 capsule Oral QODAY  . erlotinib  150 mg Oral Daily  . irbesartan  300 mg Oral Daily  . levalbuterol  0.63 mg Nebulization Q6H WA  . loratadine  10 mg Oral Daily  . morphine  30 mg Oral Q12H  . multivitamin with minerals  1 tablet Oral QHS  . pantoprazole  40 mg Oral BID AC  . piperacillin-tazobactam (ZOSYN)  IV  3.375 g Intravenous Q8H  . tiotropium  18 mcg Inhalation Daily   Continuous Infusions:    Principal Problem:   Hydropneumothorax Active Problems:   Hypertension   Lung cancer   COPD (chronic obstructive pulmonary disease)   GERD (gastroesophageal reflux disease)   Bone metastases   Anemia of chronic disease   Right leg weakness   Hypercalcemia   Acute lumbar radiculopathy   Metastasis from malignant tumor of lung   Acute respiratory distress   Encounter for chest tube placement    Time spent: 25    [facility]  [facility] Pager [phone]. If 7PM-7AM, please contact night-coverage at [email or web address], password [facility] 01/03/2013, 12:45 PM  LOS: 8 days

## 2013-01-03 NOTE — Progress Notes (Signed)
Put [facility] long term care form in registration desk.

## 2013-01-03 NOTE — Progress Notes (Addendum)
Palliative Medicine Team Progress Note  Joel Gibson looks much better today. He is receiving inpatient radiation to his psoas and was evaluated yesterday by Dr. Ollen Bowl for interventional pain options. His primary reason for hospitalization was hydropnuemothorax for which he continues to have a chest tube in place-PCCM is managing the tube-we need to consider "long term" management options for this based on his goals to rehab and improve his lower extremity strength enough to go home. He has long term care insurance that will cover full in home assist after 60 days of rehab. He wants to continue Tarceva and aggressive medical interventions as long as they are targeted at his QOL with the goal of going home -he wants to be on his back porch and with his family for as much time as possible.  1. Patient still has chest tube that is being actively managed inpatient by CCM. He will need some type of long term chest drainage system for malignant effusion/ptx secondary to malignancy.  2. Continue XRT for symptom management of tumor burden-this will likely provide significant relief-notes reviewed.  3. Back Pain and Gait instability, appreciate Dr. Ollen Bowl evaluation and assistance- notes reviewed and plans noted. He may do well with focused and cautious rehab on strengthening his axial and lower extremities muscles. Mobility will be key for his QOL goals. CIR consult placed.  4. Long term/Advance Care Planning: I continued to work with patient and in particular with his wife on care coordination and education on resources and possible choices/options for his care given his inevitable disease progression. I discussed with her today the difference between palliative care and hospice.  25 minutes. Greater than 50%  of this time was spent counseling and coordinating care related to the above assessment and plan.  Will continue to follow and provide support.  Anderson Malta, DO Palliative Medicine

## 2013-01-03 NOTE — Consult Note (Signed)
Physical Medicine and Rehabilitation Consult Reason for Consult: Deconditioning/NSCLC Referring Physician: [facility]   HPI: Joel Gibson is a 79 y.o. right-handed male multi-medical with history of lung cancer diagnosed 2013 followed by Dr. Arbutus Ped metastatic to the brain and bone status post stereotactic radiotherapy, CVA, hypertension. Patient independent with cane and/or walker prior to admission. Admitted 12/27/2012 with pain from his hip radiating down his right leg as well as unwitnessed fall. Cranial CT scan showed overall stable appearance of the left frontal lobe mass with surrounding vasogenic edema no new hemorrhage or significant change from prior tracings. MRI lumbar spine showed pathologic burst fracture at L3 with some retropulsion of bone and extensive epidural tumor extending into the lateral recess and foramina bilaterally right greater than left as well as extensive retroperitoneal lymphadenopathy. CT of the chest showed no pulmonary emboli however there was progression of disease with marked enlargement of right upper lobe mass as well as noted lymphadenopathy. Findings of hydropneumothorax with chest tube placed and plan long-term. Placed on Decadron therapy for L3 compression fracture as well as initiation of palliative radiation therapy to lumbar L2-L5 with 5 of 10 treatments completed. Patient remains on Aggrenox for history of CVA. MS Contin ongoing 30 mg every 12 hours for pain management. Palliative care is involved in patient's hospital course. Physical therapy evaluation completed an ongoing. M.D. is requested physical medicine rehabilitation consult to consider inpatient rehabilitation services.   Review of Systems  Respiratory:       Shortness of breath on activity  Gastrointestinal:       GERD  Musculoskeletal: Positive for back pain, joint pain and myalgias.  All other systems reviewed and are negative.   Past Medical History  Diagnosis Date  . Hyperlipidemia   .  Hypertension   . Aortic stenosis     mild to moderate  . Gastritis   . COPD (chronic obstructive pulmonary disease)   . Bronchitis   . Hepatitis     possible  . Hx of cataract surgery     lt eye  . Tobacco abuse   . Shortness of breath     with activity  . Retinal vein occlusion 1990  . GERD (gastroesophageal reflux disease)   . Hemorrhoid   . Aortic stenosis   . Hypertension   . Dyslipidemia   . Glaucoma   . Right rib fracture      Pathological Fracture / 4 weeks ago  . Lung mass     Right Upper Lobe - Lobular Mass  . SOB (shortness of breath)     Mild  . Cough   . Stroke 2002    Right slight difference in vision  . S/P radiation therapy 02/22/12 - 03/07/12    Pelvis/Sacrum/30 Gray/10 Fractions and Left Scapula/Rib/ 8 Gray/1 Fraction   . Status post chemotherapy      Carboplatin/ Abraxane  . Status post chemotherapy Stopped 10/03/12    Gemcitabine  - Progressed  . Lung cancer 02/02/2012  . Bone metastases     Widespread/ Lytic Lesion Left 7t Rib, Left Iliac Pelvis, ribs and scapula  . S/P radiation therapy 10/21/12    SRS Brain / Left Frontal / 20 Gy  . Status post chemotherapy     Docetaxel with Neulasta Support - Stopped due to Intolerance and Persistent Thrombocytopenia   . On antineoplastic chemotherapy     Tarceva daily / Xgeva Q 4 weeks  . Right sided weakness 12/19/2012  . Hx of fall 12/22/2012  .  Health care home, active care coordination January 23, 2013  . Pneumothorax     Right Hydro-Pneumothorax  . Encounter for chest tube placement 12/30/13   Past Surgical History  Procedure Laterality Date  . Vasectomy    . Tonsillectomy    . Leg surgery      rt for nerve impingement  . Collarbone fracture      1977  . Tonsillectomy    . Eye surgery      catarct bil  . Portacath placement  02/08/2012    Procedure: INSERTION PORT-A-CATH;  Surgeon: [name], MD;  Location: [facility] OR;  Service: Thoracic;  Laterality: Right;  . Ct guided core biopsy  01/26/12    Left  Lytic Lesion - Metastatic Squamous Cell Carcinoma  . Skin biopsy  02/09/12    Right Upper Back, shave : Melanoma In Situ Arising  From a Dysplastic Nevus   Family History  Problem Relation Age of Onset  . Heart attack Father     x7  . Throat cancer Father     Deceased from Staph Infection  . Heart attack Father     7 Times  . Leukemia Mother    Social History:  reports that he quit smoking about a year ago. His smoking use included Cigarettes. He has a 130 pack-year smoking history. He has never used smokeless tobacco. He reports that he drinks alcohol. He reports that he does not use illicit drugs. Allergies: No Known Allergies Medications Prior to Admission  Medication Sig Dispense Refill  . albuterol (PROVENTIL HFA;VENTOLIN HFA) 108 (90 BASE) MCG/ACT inhaler Inhale 2 puffs into the lungs every 4 (four) hours as needed for wheezing or shortness of breath.       . Alum & Mag Hydroxide-Simeth (MAGIC MOUTHWASH W/LIDOCAINE) SOLN Take 5 mLs by mouth 4 (four) times daily as needed (do no swallow.  swish a spit QID prn).  180 mL  0  . amLODipine (NORVASC) 2.5 MG tablet Take 1 tablet (2.5 mg total) by mouth daily.  90 tablet  3  . aspirin EC 81 MG tablet Take 81 mg by mouth every other day. Takes along with Aggrenox      . betaxolol (BETOPTIC-S) 0.25 % ophthalmic suspension Place 1-2 drops into both eyes daily. 1 drop in both eyes in the am, 1 drop in the right eye in pm      . Calcium Carbonate (CALCIUM 600 PO) Take 600 mg by mouth 2 (two) times daily.      Marland Kitchen denosumab (XGEVA) 120 MG/1.7ML SOLN injection Inject 120 mg into the skin once.      Marland Kitchen dexamethasone (DECADRON) 0.5 MG tablet Take 1 tablet (0.5 mg total) by mouth daily with breakfast. Start Sept20th, take through Oct10th to prevent withdrawal symptoms from steroid  21 tablet  0  . dipyridamole-aspirin (AGGRENOX) 200-25 MG per 12 hr capsule Take 1-2 capsules by mouth daily. Alternates between 1 and 2 tabs daily.      Marland Kitchen erlotinib (TARCEVA)  150 MG tablet Take 1 tablet (150 mg total) by mouth daily. Take on an empty stomach 1 hour before meals or 2 hours after.  30 tablet  2  . fluconazole (DIFLUCAN) 100 MG tablet Take 1 tablet (100 mg total) by mouth as directed. 200mg  STAT, then take 100mg  daily x 10 days.  12 tablet  0  . irbesartan (AVAPRO) 300 MG tablet Take 300 mg by mouth daily.       Marland Kitchen lidocaine-prilocaine (EMLA) cream Apply  1 application topically as needed (for port-a-cath access).       Marland Kitchen loratadine (CLARITIN) 10 MG tablet Take 10 mg by mouth daily.      Marland Kitchen LORazepam (ATIVAN) 1 MG tablet Take 1 tablet (1 mg total) by mouth at bedtime as needed for anxiety.  30 tablet  3  . morphine (MS CONTIN) 15 MG 12 hr tablet Take 15 mg by mouth 2 (two) times daily as needed for pain.      . Multiple Vitamin (MULTIVITAMIN WITH MINERALS) TABS tablet Take 1 tablet by mouth at bedtime.      Marland Kitchen omeprazole (PRILOSEC) 20 MG capsule Take 20 mg by mouth daily. Per Dr. Virgel Manifold      . oxyCODONE (OXY IR/ROXICODONE) 5 MG immediate release tablet Take 1 tablet (5 mg total) by mouth every 4 (four) hours as needed for pain (May take 1-2 tablets every 4 hours as needed for pain.). May take 1-2 tablets every 4 hours as needed for  pain  60 tablet  0  . Probiotic Product (PROBIOTIC DAILY PO) Take 1 tablet by mouth daily.      . prochlorperazine (COMPAZINE) 10 MG tablet Take 10 mg by mouth every 6 (six) hours as needed (for nausea).       . rosuvastatin (CRESTOR) 10 MG tablet Take 10 mg by mouth daily.      . sennosides-docusate sodium (SENOKOT-S) 8.6-50 MG tablet Take 1 tablet by mouth daily.      Marland Kitchen tiotropium (SPIRIVA) 18 MCG inhalation capsule Place 18 mcg into inhaler and inhale daily.          Home: Home Living Family/patient expects to be discharged to:: Private residence Living Arrangements: Spouse/significant other Available Help at Discharge: Available 24 hours/day;Family Type of Home: House Home Access: Stairs to enter [employer] of  Steps: 3 Entrance Stairs-Rails: None (in the process of building handrails) Home Layout: Two level;Able to live on main level with bedroom/bathroom Alternate Level Stairs-Number of Steps: one flight Alternate Level Stairs-Rails: Right Home Equipment: Walker - 4 wheels;Cane - single point;Grab bars - toilet;Grab bars - tub/shower;Bedside commode;Other (comment);Shower seat  Lives With: Spouse  Functional History: Prior Function Vocation: Retired Comments: pt's wife report pt requires assistance for all ADLs and transfers/ambulation prior to hospitalization. Functional Status:  Mobility: Bed Mobility Bed Mobility: Rolling Right;Right Sidelying to Sit;Sitting - Scoot to Edge of Bed Rolling Right: 4: Min guard Rolling Left: 4: Min assist Right Sidelying to Sit: 4: Min guard;With rails Left Sidelying to Sit: 4: Min assist;HOB elevated Sitting - Scoot to Edge of Bed: 4: Min guard Sit to Supine: 1: +2 Total assist Sit to Supine: Patient Percentage: 50% Sit to Sidelying Left: 3: Mod assist Transfers Transfers: Sit to Stand;Stand to Sit Sit to Stand: 4: Min assist;With upper extremity assist;From bed;From chair/3-in-1 Stand to Sit: 4: Min assist;With upper extremity assist;To bed;To chair/3-in-1 Ambulation/Gait Ambulation/Gait Assistance: 1: +2 Total assist Ambulation/Gait: Patient Percentage: 60% Ambulation Distance (Feet): 5 Feet Assistive device: Rolling walker Ambulation/Gait Assistance Details: pt was able to take sliding steps with R leg  to get to recliner. Gait Pattern: Right steppage;Step-to pattern;Decreased step length - right;Decreased weight shift to right Stairs: No    ADL: ADL Lower Body Dressing: Performed;+2 Total assistance (depends pad) Where Assessed - Lower Body Dressing: Supported sit to stand Toilet Transfer: Moderate assistance Toilet Transfer Method: Stand pivot Toilet Transfer Equipment: Bedside commode Equipment Used: Rolling walker Transfers/Ambulation  Related to ADLs: mod A stand pivot ADL Comments:  Pt is able is perform UB adls.  Wife has been helping with LB for months due to progressive weakness  Cognition: Cognition Overall Cognitive Status: History of cognitive impairments - at baseline Orientation Level: Oriented X4 Attention: Sustained Sustained Attention: Appears intact Memory: Appears intact (to medical events, premorbid hx) Awareness: Appears intact (awareness to fall risk d/t weakness) Problem Solving: Appears intact Safety/Judgment: Appears intact Cognition Arousal/Alertness: Awake/alert Behavior During Therapy: WFL for tasks assessed/performed Overall Cognitive Status: History of cognitive impairments - at baseline Memory: Decreased short-term memory  Blood pressure 127/65, pulse 102, temperature 98.2 F (36.8 C), temperature source Oral, resp. rate 16, height 5\' 7"  (1.702 m), weight 66.7 kg (147 lb 0.8 oz), SpO2 98.00%. Physical Exam  Vitals reviewed. Constitutional: He is oriented to person, place, and time.  Hair loss  HENT:  Head: Normocephalic.  Eyes: EOM are normal.  Neck: Normal range of motion. Neck supple. No thyromegaly present.  Cardiovascular: Normal rate and regular rhythm.   Respiratory:  Right chest tube in place  GI: Soft. Bowel sounds are normal. He exhibits no distension.  Neurological: He is alert and oriented to person, place, and time.  Follows three-step commands. Alert and appropriate. A little HOH? Moves all 4's but is limited proximally at the right hip and knee, +/- due to pain vs focal weakness from radic.   Skin: Skin is warm and dry.  Psychiatric: He has a normal mood and affect. His behavior is normal.    Results for orders placed during the hospital encounter of January 03, 2013 (from the past 24 hour(s))  CBC     Status: Abnormal   Collection Time    01/03/13  4:55 AM      Result Value Range   WBC 10.4  4.0 - 10.5 K/uL   RBC 2.38 (*) 4.22 - 5.81 MIL/uL   Hemoglobin 7.8 (*) 13.0 -  17.0 g/dL   HCT 82.9 (*) 56.2 - 13.0 %   MCV 101.7 (*) 78.0 - 100.0 fL   MCH 32.8  26.0 - 34.0 pg   MCHC 32.2  30.0 - 36.0 g/dL   RDW 86.5 (*) 78.4 - 69.6 %   Platelets 168  150 - 400 K/uL  BASIC METABOLIC PANEL     Status: Abnormal   Collection Time    01/03/13  4:55 AM      Result Value Range   Sodium 136  135 - 145 mEq/L   Potassium 4.1  3.5 - 5.1 mEq/L   Chloride 103  96 - 112 mEq/L   CO2 22  19 - 32 mEq/L   Glucose, Bld 104 (*) 70 - 99 mg/dL   BUN 32 (*) 6 - 23 mg/dL   Creatinine, Ser 2.95  0.50 - 1.35 mg/dL   Calcium 9.5  8.4 - 28.4 mg/dL   GFR calc non Af Amer 59 (*) >90 mL/min   GFR calc Af Amer 69 (*) >90 mL/min  PROCALCITONIN     Status: None   Collection Time    01/03/13  4:58 AM      Result Value Range   Procalcitonin 0.14     Dg Chest Port 1 View  01/03/2013   CLINICAL DATA:  Pneumothorax  EXAM: PORTABLE CHEST - 1 VIEW  COMPARISON:  12/31/2012  FINDINGS: Right basilar chest tube in stable position. Unchanged positioning of right subclavian porta catheter. Small right apical pneumothorax persists, similar to 3 days prior. There is improved aeration at the right base. Re- demonstrated peritracheal lung  mass on the right. Clear left lung. Normal heart size.  IMPRESSION: 1. Small right apical pneumothorax persists. 2. Cleared right pleural effusion and atelectasis.   Electronically Signed   By: Tiburcio Pea M.D.   On: 01/03/2013 05:48    Assessment/Plan: Diagnosis: advanced metastatic lung ca with L3 pathologic fx with likely L3/L4 radic, substantial pain 1. Does the need for close, 24 hr/day medical supervision in concert with the patient's rehab needs make it unreasonable for this patient to be served in a less intensive setting? No 2. Co-Morbidities requiring supervision/potential complications: chest tube for hydropneumothorax 3. Due to bladder management, bowel management, safety, skin/wound care, disease management, medication administration, pain management and  patient education, does the patient require 24 hr/day rehab nursing? No 4. Does the patient require coordinated care of a physician, rehab nurse, PT, OT to address physical and functional deficits in the context of the above medical diagnosis(es)? No Addressing deficits in the following areas: balance, endurance, locomotion, strength and transferring 5. Can the patient actively participate in an intensive therapy program of at least 3 hrs of therapy per day at least 5 days per week? No 6. The potential for patient to make measurable gains while on inpatient rehab is fair and poor 7. Anticipated functional outcomes upon discharge from inpatient rehab are n/a with PT, n/a with OT, n/a with SLP. 8. Estimated rehab length of stay to reach the above functional goals is: n/a 9. Does the patient have adequate social supports to accommodate these discharge functional goals? Yes 10. Anticipated D/C setting: TBD 11. Anticipated post D/C treatments: HH therapy 12. Overall Rehab/Functional Prognosis: fair  RECOMMENDATIONS: This patient's condition is appropriate for continued rehabilitative care in the following setting: SNF vs HH therapy Patient has agreed to participate in recommended program. Yes Note that insurance prior authorization may be required for reimbursement for recommended care.  Comment: I spoke with the patient and his wife at length. While I appreciate his motivation, I feel that vigors of our program would be too much for Joel Gibson, given his multiple issues. SNF-level therapy would allow him to participate more at his own pace and tolerance level. If there is some improvement in his pain via ESI and XRT, then perhaps he could go home with home health/hospice services.    Thanks   Ranelle Oyster, MD, [facility] Physical Medicine & Rehabilitation     01/03/2013

## 2013-01-03 NOTE — Progress Notes (Deleted)
[facility] PROGRESS NOTE  Joel Gibson ZOX:[id number] DOB: [birth year] DOA: 27-Dec-2012 PCP: [name], MD  Assessment/Plan:  Hydropneumothorax/Post obstructive pnuemonia -greatly appreciate PCCM consult -s/p Emergent chest tube 10/24, -clinically improved  -Duration of chest tube per Pulm, still with airleak -Day 5 of IV Zosyn, change to PO abx tomorrow -Repeat CXR improved  L3 Pathologic burst fracture with some retropulsion of bone and extensive epidural tumor as per MRI -with Right leg weakness/Pain-improving -continue decadron -Rad Onc/Dr Basilio Cairo following, started XRT10/22 for 10treatments total -CT head with vasogenic edema, will continue with decadron q6 PO -PT/OT  Following, SNF recommended -increased MSContin for pain -Appreciate Dr.Goldings assistance, Seen by Dr.Harkins for interventional pain consult to re-evaluate Wednesday   . Hypercalcemia  -secondary to malignancy/bone mets  -trending down on IVF, steroids   -improved  . Extensive Metastases/Stage 4 non small cell Lung cancer  - Dr Arbutus Ped and Rad Onc following - prognosis appears rather poor - on Tarceva and getting Palliative XRT -s/p Palliative meeting 10/26, greatly appreciate Dr.Golding's assistance -plan for SNF with PCS following, DNR now  . Hypertension  -continue outpt meds   . GERD (gastroesophageal reflux disease)  -place on PPI   . COPD (chronic obstructive pulmonary disease)  - continue outpt meds, add prn nebs   . Anemia of chronic disease  -stable, monitor   Constipation -now with freq BMs -cut down Miralax and stop senokot  Global: Given Advanced stage Lung CA with Mets to Brain, spine, liver , progressive disease on CTA chest, s/p Palliative consult for Goals of care  Code Status: full Family Communication: wife at bedside  Disposition Plan: SNF pending Chest tube removal   Consultants:  Neuro  Radiation -onc  onc   Pulm  Dr.Paul  Harkins  Procedures:  Chest tube 10/24  XRT to Lumbar spine  Antibiotics:  Zosyn 10/24  HPI/Subjective: Feels good today, pain is mild to moderate, Breathing much better Objective: Filed Vitals:   01/03/13 0528  BP: 127/65  Pulse: 102  Temp: 98.2 F (36.8 C)  Resp: 16    Intake/Output Summary (Last 24 hours) at 01/03/13 1245 Last data filed at 01/03/13 0800  Gross per 24 hour  Intake    560 ml  Output    731 ml  Net   -171 ml   Filed Weights   12-27-12 1712 12/31/12 0400 01/01/13 0400  Weight: 66 kg (145 lb 8.1 oz) 67.7 kg (149 lb 4 oz) 66.7 kg (147 lb 0.8 oz)    Exam:  General: alert & oriented x 3, mild distress Cardiovascular: RRR, nl S1 s2 Respiratory: decreased BS at bases, occasional ronchi Abdomen: soft +BS NT/ND, no masses palpable Extremities: No cyanosis and no edema, RLE foot strength 3/5 and rest of R.Leg 4-5/5, L side strength 5/5     Data Reviewed: Basic Metabolic Panel:  Recent Labs Lab 12/29/12 0530 12/31/12 0730 01/01/13 0430 01/02/13 0613 01/03/13 0455  NA 137 136 132* 136 136  K 4.2 4.1 3.8 4.1 4.1  CL 108 103 101 104 103  CO2 19 23 23 22 22   GLUCOSE 115* 112* 127* 119* 104*  BUN 19 21 29* 30* 32*  CREATININE 0.80 0.84 0.94 1.03 1.14  CALCIUM 10.0 10.0 9.4 9.8 9.5   Liver Function Tests:  Recent Labs Lab 12/29/12 0530  AST 48*  ALT 40  ALKPHOS 125*  BILITOT 0.4  PROT 5.5*  ALBUMIN 2.3*   No results found for this basename: LIPASE, AMYLASE,  in the  last 168 hours No results found for this basename: AMMONIA,  in the last 168 hours CBC:  Recent Labs Lab 12/29/12 0530 12/31/12 0730 01/01/13 0430 01/02/13 0613 01/03/13 0455  WBC 11.4* 11.9* 10.7* 13.0* 10.4  HGB 8.9* 9.0* 8.4* 8.6* 7.8*  HCT 27.6* 28.8* 26.7* 27.3* 24.2*  MCV 99.6 100.3* 100.0 101.1* 101.7*  PLT 198 211 190 202 168   Cardiac Enzymes: No results found for this basename: CKTOTAL, CKMB, CKMBINDEX, TROPONINI,  in the last 168 hours BNP (last 3  results) No results found for this basename: PROBNP,  in the last 8760 hours CBG: No results found for this basename: GLUCAP,  in the last 168 hours  Recent Results (from the past 240 hour(s))  MRSA PCR SCREENING     Status: None   Collection Time    12/30/12  1:16 PM      Result Value Range Status   MRSA by PCR NEGATIVE  NEGATIVE Final   Comment:            The GeneXpert MRSA Assay (FDA     approved for NASAL specimens     only), is one component of a     comprehensive MRSA colonization     surveillance program. It is not     intended to diagnose MRSA     infection nor to guide or     monitor treatment for     MRSA infections.     Studies: Dg Chest Port 1 View  01/03/2013   CLINICAL DATA:  Pneumothorax  EXAM: PORTABLE CHEST - 1 VIEW  COMPARISON:  12/31/2012  FINDINGS: Right basilar chest tube in stable position. Unchanged positioning of right subclavian porta catheter. Small right apical pneumothorax persists, similar to 3 days prior. There is improved aeration at the right base. Re- demonstrated peritracheal lung mass on the right. Clear left lung. Normal heart size.  IMPRESSION: 1. Small right apical pneumothorax persists. 2. Cleared right pleural effusion and atelectasis.   Electronically Signed   By: Tiburcio Pea M.D.   On: 01/03/2013 05:48    Scheduled Meds: . amLODipine  2.5 mg Oral Daily  . aspirin EC  81 mg Oral QODAY  . atorvastatin  20 mg Oral q1800  . betaxolol  1 drop Both Eyes Daily  . betaxolol  1 drop Right Eye QHS  . dexamethasone  4 mg Oral Q8H  . dipyridamole-aspirin  1 capsule Oral QHS  . dipyridamole-aspirin  1 capsule Oral QODAY  . erlotinib  150 mg Oral Daily  . irbesartan  300 mg Oral Daily  . levalbuterol  0.63 mg Nebulization Q6H WA  . loratadine  10 mg Oral Daily  . morphine  30 mg Oral Q12H  . multivitamin with minerals  1 tablet Oral QHS  . pantoprazole  40 mg Oral BID AC  . piperacillin-tazobactam (ZOSYN)  IV  3.375 g Intravenous Q8H  .  tiotropium  18 mcg Inhalation Daily   Continuous Infusions:    Principal Problem:   Hydropneumothorax Active Problems:   Hypertension   Lung cancer   COPD (chronic obstructive pulmonary disease)   GERD (gastroesophageal reflux disease)   Bone metastases   Anemia of chronic disease   Right leg weakness   Hypercalcemia   Acute lumbar radiculopathy   Metastasis from malignant tumor of lung   Acute respiratory distress   Encounter for chest tube placement    Time spent: 25    [facility]  [facility] Pager [phone]. If  7PM-7AM, please contact night-coverage at [email or web address], password [email or web address] 01/03/2013, 12:45 PM  LOS: 8 days

## 2013-01-03 NOTE — Progress Notes (Signed)
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 Admitted on 10/21 with new right LE weakness. Found to have new pathological lumbar fracture by MRI. Began palliative XRT on 10/22. Moved to SDU on 10/24 after developing spontaneous right pneumothorax/ hydropneumothorax.   SIGNIFICANT EVENTS / STUDIES:  MR spine 10/20: 1. Pathologic burst fracture at L3 with some retropulsion of bone and extensive epidural tumor extending into the lateral recess and foramina bilaterally, right greater than left.2. Moderate to severe right lateral recess and foraminal stenosis at L3-4.3. Extensive retroperitoneal lymphadenopathy.4. Mild left foraminal narrowing at L3-4. 5. Extra medullary tumor extends into the psoas muscles bilaterally,right greater than left. 6. Heterogeneous cystic lesions are present within the sacrum and right iliac bone. These likely represent metastases as well.7. Multiple cysts are present within the kidneys bilaterally.  CT chest 10/24: 1. Limited evaluation for pulmonary embolism demonstrating no  evidence of central, lobar or proximal segmental sized pulmonary embolism at this time.  2. Progression of disease with marked enlargement of right upper lobe mass, development of right hilar and mediastinal lymphadenopathy in addition to marked left axillary and subpectoral lymphadenopathy, probable retroperitoneal lymphadenopathy, malignant  right pleural effusion with extensive right pleural nodularity, and widespread metastases throughout the liver. 3. Additional findings, as above.   LINES / TUBES: Port (chonic) Right wayne cath 10/24>>>  CULTURES:   ANTIBIOTICS: Zosyn 10/24>>>  SUBJECTIVE:   Feels good.   VITAL SIGNS: Temp:  [98.1 F (36.7 C)-98.2 F (36.8 C)] 98.2 F (36.8 C) (10/28 0528) Pulse Rate:  [69-112] 102 (10/28 0528) Resp:  [16-20] 16 (10/28 0528) BP: (99-127)/(49-65) 127/65 mmHg (10/28 0528) SpO2:  [95 %-99 %] 98 % (10/28 0528)  PHYSICAL EXAMINATION: General:  Chronically ill appearing white male, not in acute distress. Neuro:  Awake, alert, oriented , rt le weakness HEENT:  [place], no JVD  Cardiovascular:  rrr Lungs: improved BS, large air leak via chest tube Abdomen:  Non-tender  Musculoskeletal:  Intact, RLE weak   Recent Labs Lab 01/01/13 0430 01/02/13 0613 01/03/13 0455  NA 132* 136 136  K 3.8 4.1 4.1  CL 101 104 103  CO2 23 22 22   BUN 29* 30* 32*  CREATININE 0.94 1.03 1.14  GLUCOSE 127* 119* 104*    Recent Labs Lab 01/01/13 0430 01/02/13 0613 01/03/13 0455  HGB 8.4* 8.6* 7.8*  HCT 26.7* 27.3* 24.2*  WBC 10.7* 13.0* 10.4  PLT 190 202 168   Dg Chest Port 1 View  01/03/2013   CLINICAL DATA:  Pneumothorax  EXAM: PORTABLE CHEST - 1 VIEW  COMPARISON:  12/31/2012  FINDINGS: Right basilar chest tube in stable position. Unchanged positioning of right subclavian porta catheter. Small right apical pneumothorax persists, similar to 3 days prior. There is improved aeration at the right base. Re- demonstrated peritracheal lung mass on the right. Clear left lung. Normal heart size.  IMPRESSION: 1. Small right apical pneumothorax persists. 2. Cleared right pleural effusion and atelectasis.   Electronically Signed   By: Tiburcio Pea M.D.   On: 01/03/2013 05:48    ASSESSMENT / PLAN: Acute respiratory distress in the setting of Large right Hydropneumothorax. Improved with CT placement Post-obstructive PNA  COPD Stage IV NSCLC w/ extensive mets to bone Evident mets to pleural space  Plan -cont Chest tube to -20 cm H20  , Consider valve -serial CXR -cont  (zosyn) -cont Tarceva per Dr Asa Lente plans. Note that this may preclude him from getting  hospice care -ok to resume palliative XRT to spine   Hypertension  -continue outpt meds   GERD (gastroesophageal reflux disease)  -place on PPI   Anemia  -stable, monitor and further manage as appropriate   Summary: Unable to remove CCT on 10/28 due to air leak. Consider valve for chest tube if this is desirable to facilitate discharge to home.     Brett Canales Minor ACNP [facility] Pager [phone] till 3 pm If no answer page [phone] 01/03/2013, 9:40 AM  Levy Pupa, MD, PhD 01/03/2013, 11:39 AM [facility] [phone] or if no answer [phone]

## 2013-01-04 ENCOUNTER — Inpatient Hospital Stay (HOSPITAL_COMMUNITY): Payer: Medicare Other

## 2013-01-04 ENCOUNTER — Ambulatory Visit
Admit: 2013-01-04 | Discharge: 2013-01-04 | Disposition: A | Discharge status: Home / Self Care | Payer: Medicare Other | Attending: Radiation Oncology | Admitting: Radiation Oncology

## 2013-01-04 ENCOUNTER — Ambulatory Visit: Payer: Medicare Other

## 2013-01-04 NOTE — Progress Notes (Signed)
Called and notified. Dr. Marin Shutter about the hemilech valve draining out again onto the bed. Dr. Marin Shutter gave orders for a care order instruction to drain fluid briefly using the pleuravac suction and then rehooking the hemilech valve back up. Notified oncoming nurse Mechele Dawley [place] about orders. Erskin Burnet RN

## 2013-01-04 NOTE — Progress Notes (Signed)
PULMONARY  / CRITICAL CARE MEDICINE  Name: Joel Gibson MRN: [id number] DOB: 10-27-1931    ADMISSION DATE:  Jan 14, 2013 CONSULTATION DATE:  10/24  REFERRING MD :  Jomarie Longs  PRIMARY SERVICE:   [facility]   CHIEF COMPLAINT:   Hydropneumothorax   BRIEF PATIENT DESCRIPTION:  79 year old male w/ NSCLC stage IV (mets to brain, spine and pelvis. Admitted on 10/21 with new right LE weakness. Found to have new pathological lumbar fracture by MRI. Began palliative XRT on 10/22. Moved to SDU on 10/24 after developing spontaneous right pneumothorax/ hydropneumothorax.   SIGNIFICANT EVENTS / STUDIES:  MR spine 10/20: 1. Pathologic burst fracture at L3 with some retropulsion of bone and extensive epidural tumor extending into the lateral recess and foramina bilaterally, right greater than left.2. Moderate to severe right lateral recess and foraminal stenosis at L3-4.3. Extensive retroperitoneal lymphadenopathy.4. Mild left foraminal narrowing at L3-4. 5. Extra medullary tumor extends into the psoas muscles bilaterally,right greater than left. 6. Heterogeneous cystic lesions are present within the sacrum and right iliac bone. These likely represent metastases as well.7. Multiple cysts are present within the kidneys bilaterally.  CT chest 10/24: 1. Limited evaluation for pulmonary embolism demonstrating no  evidence of central, lobar or proximal segmental sized pulmonary embolism at this time.  2. Progression of disease with marked enlargement of right upper lobe mass, development of right hilar and mediastinal lymphadenopathy in addition to marked left axillary and subpectoral lymphadenopathy, probable retroperitoneal lymphadenopathy, malignant  right pleural effusion with extensive right pleural nodularity, and widespread metastases throughout the liver. 3. Additional findings, as above.   LINES / TUBES: Port (chonic) Right wayne cath 10/24>>>  CULTURES:   ANTIBIOTICS: Zosyn 10/24>>>  SUBJECTIVE:   Feels good.   VITAL SIGNS: Temp:  [97.5 F (36.4 C)] 97.5 F (36.4 C) (10/29 0500) Pulse Rate:  [101-117] 101 (10/29 0500) Resp:  [18] 18 (10/29 0500) BP: (110-127)/(51-67) 126/61 mmHg (10/29 0918) SpO2:  [95 %-100 %] 97 % (10/29 0919)  PHYSICAL EXAMINATION: General:  Chronically ill appearing white male, not in acute distress. Neuro:  Awake, alert, oriented , rt le weakness HEENT:  [place], no JVD  Cardiovascular:  rrr Lungs: improved BS, large air leak via chest tube off suction Abdomen:  Non-tender  Musculoskeletal:  Intact, RLE weak   Recent Labs Lab 01/01/13 0430 01/02/13 0613 01/03/13 0455  NA 132* 136 136  K 3.8 4.1 4.1  CL 101 104 103  CO2 23 22 22   BUN 29* 30* 32*  CREATININE 0.94 1.03 1.14  GLUCOSE 127* 119* 104*    Recent Labs Lab 01/01/13 0430 01/02/13 0613 01/03/13 0455  HGB 8.4* 8.6* 7.8*  HCT 26.7* 27.3* 24.2*  WBC 10.7* 13.0* 10.4  PLT 190 202 168   Dg Chest 2 View  01/04/2013   CLINICAL DATA:  Short of breath, pneumonia, lung cancer and the pneumothorax  EXAM: CHEST  2 VIEW  COMPARISON:  Prior chest x-ray 01/03/2013  FINDINGS: Stable to incrementally enlarged right apical pneumothorax. Slight differences may be related to differences in patient positioning. Stable position of right basilar pigtail thoracostomy tube. Stable position of the right subclavian approach port catheter. Large right upper lobe mass with adjacent patchy infiltrate is also unchanged. No new airspace consolidation, pleural effusion or pneumothorax. Stable cardiac and mediastinal contours. No acute osseous abnormality.  IMPRESSION: 1. Stable to incrementally enlarged right apical pneumothorax. The right basilar pigtail thoracostomy tube remains in good position. 2. Unchanged appearance of right upper lobe mass  and adjacent patchy infiltrates likely reflecting a postobstructive changes versus lymphangitic spread of the disease.   Electronically Signed   By: Malachy Moan M.D.   On:  01/04/2013 09:16   Dg Chest Port 1 View  01/03/2013   CLINICAL DATA:  Pneumothorax  EXAM: PORTABLE CHEST - 1 VIEW  COMPARISON:  12/31/2012  FINDINGS: Right basilar chest tube in stable position. Unchanged positioning of right subclavian porta catheter. Small right apical pneumothorax persists, similar to 3 days prior. There is improved aeration at the right base. Re- demonstrated peritracheal lung mass on the right. Clear left lung. Normal heart size.  IMPRESSION: 1. Small right apical pneumothorax persists. 2. Cleared right pleural effusion and atelectasis.   Electronically Signed   By: Tiburcio Pea M.D.   On: 01/03/2013 05:48    ASSESSMENT / PLAN: Acute respiratory distress in the setting of Large right Hydropneumothorax. Improved with CT placement Post-obstructive PNA COPD Stage IV NSCLC w/ extensive mets to bone Evident mets to pleural space  Plan -place Heimlich valve on chest tube 10/29 and follow sx and CXR for collapse. Discussed with him today the possibility of having a valve long term vs possibly removing chest tube if he collapses lung but remains asymptomatic.   -serial CXR -cont  (zosyn) per IM -cont Tarceva per Dr Asa Lente plans. Note that this may preclude him from getting hospice care -ok to resume palliative XRT to spine   Hypertension  -continue outpt meds   GERD (gastroesophageal reflux disease)  -place on PPI   Anemia  -stable, monitor and further manage as appropriate   Summary: Unable to remove CCT on 10/29 due to air leak. Consider one-way valve for chest tube if this is desirable to facilitate discharge to home or SNF. Wife will not consider chest removal as a trial with reinsertion if needed. Question could porta cath be in anyway responsible for PNX. It was placed 11-13?   Brett Canales Minor ACNP [facility] Pager [phone] till 3 pm If no answer page [phone] 01/04/2013, 10:33 AM  Levy Pupa, MD, PhD 01/04/2013, 12:17 PM [facility] [phone] or if no answer [phone]

## 2013-01-04 NOTE — Progress Notes (Signed)
Patient ZO:XWRUEA [name]      DOB: 1931-10-22      VWU:[id number]  Checked in on patient and his spouse.  He was sitting in bed eating his lunch .  He relates his pain control has been better except for a nondescript, strong pelvic pain at the time of his radiation. He asked me to help with getting a single room at either [facility] or another reputable rehab, his intent is characterized as on of getting stronger.  He asked if hospice was mandatory upon entering a SNF.  I re-educated on the voluntary nature of hospice , which his wife stated loudly she is not in favor of.  I spoke with social work regarding the families requests for SNF and will continue to follow along as the patients treatments continue.   Darleen Moffitt L. Ladona Ridgel, MD MBA The Palliative Medicine Team at [facility] Phone: [phone] Pager: [phone]

## 2013-01-04 NOTE — Progress Notes (Signed)
Rehab admissions - Evaluated for possible admission.  Please see rehab consult done by Dr. Riley Kill.  Patient would not be able to tolerate inpatient rehab program.  Recommend SNF or [facility] therapies if patient and family prefer.  Call me for questions.  #[phone]

## 2013-01-04 NOTE — Progress Notes (Addendum)
LATE ENTRY: Pt was seen at 12:30pm [facility] PROGRESS NOTE  MANLY NESTLE ZOX:[id number] DOB: Nov 21, 1931 DOA: 12/31/2012 PCP: [name], MD Brief Narrative  ANTIONIO NEGRON is a 79 y.o. 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 Subsequently developed resp distress and was found to have Hydropneumothorax in R lung, then was seen by Pulm and underwent emergent Chest tube placement, with improvement. Was also seen by palliative medicine, now improving, see details below.   Assessment/Plan:  Hydropneumothorax/Post obstructive pnuemonia -greatly appreciate PCCM consult -s/p Emergent chest tube 10/24, appreciate pulm assistance plan per pulm is to place Heimlich valve on chest tube today 10/29, and follow sx and CXR for collapse  -clinically improved  -Duration of chest tube per Pulm, still with airleak -Day 6 of IV Zosyn, will continue today and  -Repeat CXR improved  L3 Pathologic burst fracture with some retropulsion of bone and extensive epidural tumor as per MRI -with Right leg weakness/Pain-improving -continue decadron -Rad Onc/Dr Basilio Cairo following, started XRT10/22 for 10treatments total -CT head with vasogenic edema, will continue with decadron q6 PO -PT/OT  Following, SNF recommended -increased MSContin for pain -Appreciate Dr.Goldings assistance, Seen by Dr.Harkins for interventional pain consult >>to re-evaluate pt today 10/29 and to consider possible nerve block   . Hypercalcemia  -secondary to malignancy/bone mets  -trending down on IVF, steroids   -improved, follow recheck  . Extensive Metastases/Stage 4 non small cell Lung cancer  - Dr Arbutus Ped and Rad Onc following - prognosis  appears rather poor - on Tarceva and getting Palliative XRT -s/p Palliative meeting 10/26, greatly appreciate Dr.Golding's assistance -long discussion with pt, and family and family given options of LTAC, snf, vs home with hh services -family wants to talk with CM and SW, but leaning towards home with hh services at this time -CIR declined pt  . Hypertension  -continue outpt meds   . GERD (gastroesophageal reflux disease)  -continue on PPI   . COPD (chronic obstructive pulmonary disease)  - continue outpt meds, add prn nebs   . Anemia of chronic disease  -stable, monitor   Constipation -Continue Miralax   Global: Given Advanced stage Lung CA with Mets to Brain, spine, liver , progressive disease on CTA chest, s/p Palliative consult for Goals of care  Code Status: full Family Communication: wife, daughter at bedside  Disposition Plan: likely to home when ok per PULM   Consultants:  Neuro  Radiation -onc  onc   Pulm  Dr.Paul Harkins  Procedures:  Chest tube 10/24  XRT to Lumbar spine  Antibiotics:  Zosyn 10/24  HPI/Subjective: S/p radiation today, states he was in pain but better now after pain meds Objective: Filed Vitals:   01/04/13 2125  BP: 122/50  Pulse: 108  Temp: 97.6 F (36.4 C)  Resp: 18    Intake/Output Summary (Last 24 hours) at 01/04/13 2212 Last data filed at 01/04/13 2100  Gross per 24 hour  Intake    100 ml  Output   1301 ml  Net  -1201 ml   Filed Weights   01/04/2013 1712 12/31/12 0400 01/01/13 0400  Weight: 66 kg (145 lb 8.1 oz) 67.7 kg (149 lb 4 oz) 66.7 kg (147  lb 0.8 oz)    Exam:  General: alert & oriented x 3, mild distress Cardiovascular: RRR, nl S1 s2 Respiratory: decreased BS at bases, occasional ronchi Abdomen: soft +BS NT/ND, no masses palpable Extremities: No cyanosis and no edema, RLE foot strength 3/5 and rest of R.Leg 4-5/5, L side strength 5/5     Data Reviewed: Basic Metabolic Panel:  Recent Labs Lab  12/29/12 0530 12/31/12 0730 01/01/13 0430 01/02/13 0613 01/03/13 0455  NA 137 136 132* 136 136  K 4.2 4.1 3.8 4.1 4.1  CL 108 103 101 104 103  CO2 19 23 23 22 22   GLUCOSE 115* 112* 127* 119* 104*  BUN 19 21 29* 30* 32*  CREATININE 0.80 0.84 0.94 1.03 1.14  CALCIUM 10.0 10.0 9.4 9.8 9.5   Liver Function Tests:  Recent Labs Lab 12/29/12 0530  AST 48*  ALT 40  ALKPHOS 125*  BILITOT 0.4  PROT 5.5*  ALBUMIN 2.3*   No results found for this basename: LIPASE, AMYLASE,  in the last 168 hours No results found for this basename: AMMONIA,  in the last 168 hours CBC:  Recent Labs Lab 12/29/12 0530 12/31/12 0730 01/01/13 0430 01/02/13 0613 01/03/13 0455  WBC 11.4* 11.9* 10.7* 13.0* 10.4  HGB 8.9* 9.0* 8.4* 8.6* 7.8*  HCT 27.6* 28.8* 26.7* 27.3* 24.2*  MCV 99.6 100.3* 100.0 101.1* 101.7*  PLT 198 211 190 202 168   Cardiac Enzymes: No results found for this basename: CKTOTAL, CKMB, CKMBINDEX, TROPONINI,  in the last 168 hours BNP (last 3 results) No results found for this basename: PROBNP,  in the last 8760 hours CBG: No results found for this basename: GLUCAP,  in the last 168 hours  Recent Results (from the past 240 hour(s))  MRSA PCR SCREENING     Status: None   Collection Time    12/30/12  1:16 PM      Result Value Range Status   MRSA by PCR NEGATIVE  NEGATIVE Final   Comment:            The GeneXpert MRSA Assay (FDA     approved for NASAL specimens     only), is one component of a     comprehensive MRSA colonization     surveillance program. It is not     intended to diagnose MRSA     infection nor to guide or     monitor treatment for     MRSA infections.     Studies: Dg Chest 2 View  01/04/2013   CLINICAL DATA:  Short of breath, pneumonia, lung cancer and the pneumothorax  EXAM: CHEST  2 VIEW  COMPARISON:  Prior chest x-ray 01/03/2013  FINDINGS: Stable to incrementally enlarged right apical pneumothorax. Slight differences may be related to differences  in patient positioning. Stable position of right basilar pigtail thoracostomy tube. Stable position of the right subclavian approach port catheter. Large right upper lobe mass with adjacent patchy infiltrate is also unchanged. No new airspace consolidation, pleural effusion or pneumothorax. Stable cardiac and mediastinal contours. No acute osseous abnormality.  IMPRESSION: 1. Stable to incrementally enlarged right apical pneumothorax. The right basilar pigtail thoracostomy tube remains in good position. 2. Unchanged appearance of right upper lobe mass and adjacent patchy infiltrates likely reflecting a postobstructive changes versus lymphangitic spread of the disease.   Electronically Signed   By: Malachy Moan M.D.   On: 01/04/2013 09:16   Dg Chest Port 1 View  01/03/2013   CLINICAL DATA:  Pneumothorax  EXAM: PORTABLE CHEST - 1 VIEW  COMPARISON:  12/31/2012  FINDINGS: Right basilar chest tube in stable position. Unchanged positioning of right subclavian porta catheter. Small right apical pneumothorax persists, similar to 3 days prior. There is improved aeration at the right base. Re- demonstrated peritracheal lung mass on the right. Clear left lung. Normal heart size.  IMPRESSION: 1. Small right apical pneumothorax persists. 2. Cleared right pleural effusion and atelectasis.   Electronically Signed   By: Tiburcio Pea M.D.   On: 01/03/2013 05:48    Scheduled Meds: . amLODipine  2.5 mg Oral Daily  . aspirin EC  81 mg Oral QODAY  . atorvastatin  20 mg Oral q1800  . betaxolol  1 drop Both Eyes Daily  . betaxolol  1 drop Right Eye QHS  . dexamethasone  4 mg Oral Q8H  . dipyridamole-aspirin  1 capsule Oral QHS  . dipyridamole-aspirin  1 capsule Oral QODAY  . erlotinib  150 mg Oral Daily  . irbesartan  300 mg Oral Daily  . levalbuterol  0.63 mg Nebulization Q6H WA  . loratadine  10 mg Oral Daily  . morphine  30 mg Oral Q12H  . multivitamin with minerals  1 tablet Oral QHS  . pantoprazole  40  mg Oral BID AC  . piperacillin-tazobactam (ZOSYN)  IV  3.375 g Intravenous Q8H  . tiotropium  18 mcg Inhalation Daily   Continuous Infusions:    Principal Problem:   Hydropneumothorax Active Problems:   Hypertension   Lung cancer   COPD (chronic obstructive pulmonary disease)   GERD (gastroesophageal reflux disease)   Bone metastases   Anemia of chronic disease   Right leg weakness   Hypercalcemia   Acute lumbar radiculopathy   Metastasis from malignant tumor of lung   Acute respiratory distress   Encounter for chest tube placement    Time spent: 35    [facility]  [facility] Pager [phone]. If 7PM-7AM, please contact night-coverage at [email or web address], password [facility] 01/04/2013, 10:12 PM  LOS: 9 days

## 2013-01-05 ENCOUNTER — Inpatient Hospital Stay (HOSPITAL_COMMUNITY): Payer: Medicare Other

## 2013-01-05 ENCOUNTER — Ambulatory Visit: Payer: Medicare Other

## 2013-01-05 ENCOUNTER — Ambulatory Visit
Admit: 2013-01-05 | Discharge: 2013-01-05 | Disposition: A | Discharge status: Home / Self Care | Payer: Medicare Other | Attending: Radiation Oncology | Admitting: Radiation Oncology

## 2013-01-05 DIAGNOSIS — Z515 Encounter for palliative care: Secondary | ICD-10-CM

## 2013-01-05 MED ORDER — ASPIRIN-DIPYRIDAMOLE ER 25-200 MG PO CP12
1.0000 | ORAL_CAPSULE | Freq: Every day | ORAL | Status: DC
  Administered 2013-01-06 – 2013-01-08 (×3): 1 via ORAL
  Filled 2013-01-05 (×5): qty 1

## 2013-01-05 NOTE — Progress Notes (Signed)
ANTIBIOTIC CONSULT NOTE   Pharmacy Consult for Zosyn Indication: Postobstructive PNA  No Known Allergies  Patient Measurements: Height: 5\' 7"  (170.2 cm) Weight: 147 lb 0.8 oz (66.7 kg) IBW/kg (Calculated) : 66.1  Vital Signs: Temp: 97.3 F (36.3 C) (10/30 0452) Temp src: Oral (10/30 0452) BP: 117/75 mmHg (10/30 0452) Pulse Rate: 100 (10/30 0452) Intake/Output from previous day: 10/29 0701 - 10/30 0700 In: 100 [IV Piggyback:100] Out: 1116 [Urine:865; Stool:1; Chest Tube:250] Intake/Output from this shift:    Labs:  Recent Labs  01/03/13 0455  WBC 10.4  HGB 7.8*  PLT 168  CREATININE 1.14   Estimated Creatinine Clearance: 49.1 ml/min (by C-G formula based on Cr of 1.14).   Microbiology: Recent Results (from the past 720 hour(s))  MRSA PCR SCREENING     Status: None   Collection Time    12/30/12  1:16 PM      Result Value Range Status   MRSA by PCR NEGATIVE  NEGATIVE Final   Comment:            The GeneXpert MRSA Assay (FDA     approved for NASAL specimens     only), is one component of a     comprehensive MRSA colonization     surveillance program. It is not     intended to diagnose MRSA     infection nor to guide or     monitor treatment for     MRSA infections.    Medical History: Past Medical History  Diagnosis Date  . Hyperlipidemia   . Hypertension   . Aortic stenosis     mild to moderate  . Gastritis   . COPD (chronic obstructive pulmonary disease)   . Bronchitis   . Hepatitis     possible  . Hx of cataract surgery     lt eye  . Tobacco abuse   . Shortness of breath     with activity  . Retinal vein occlusion 1990  . GERD (gastroesophageal reflux disease)   . Hemorrhoid   . Aortic stenosis   . Hypertension   . Dyslipidemia   . Glaucoma   . Right rib fracture      Pathological Fracture / 4 weeks ago  . Lung mass     Right Upper Lobe - Lobular Mass  . SOB (shortness of breath)     Mild  . Cough   . Stroke 2002    Right slight  difference in vision  . S/P radiation therapy 02/22/12 - 03/07/12    Pelvis/Sacrum/30 Gray/10 Fractions and Left Scapula/Rib/ 8 Gray/1 Fraction   . Status post chemotherapy      Carboplatin/ Abraxane  . Status post chemotherapy Stopped 10/03/12    Gemcitabine  - Progressed  . Lung cancer 02/02/2012  . Bone metastases     Widespread/ Lytic Lesion Left 7t Rib, Left Iliac Pelvis, ribs and scapula  . S/P radiation therapy 10/21/12    SRS Brain / Left Frontal / 20 Gy  . Status post chemotherapy     Docetaxel with Neulasta Support - Stopped due to Intolerance and Persistent Thrombocytopenia   . On antineoplastic chemotherapy     Tarceva daily / Xgeva Q 4 weeks  . Right sided weakness January 09, 2013  . Hx of fall 09-Jan-2013  . Health care home, active care coordination 02/02/2013  . Pneumothorax     Right Hydro-Pneumothorax  . Encounter for chest tube placement 12/30/13    Medications:  Scheduled:  . amLODipine  2.5 mg Oral Daily  . aspirin EC  81 mg Oral QODAY  . atorvastatin  20 mg Oral q1800  . betaxolol  1 drop Both Eyes Daily  . betaxolol  1 drop Right Eye QHS  . dexamethasone  4 mg Oral Q8H  . dipyridamole-aspirin  1 capsule Oral QHS  . dipyridamole-aspirin  1 capsule Oral QODAY  . erlotinib  150 mg Oral Daily  . irbesartan  300 mg Oral Daily  . levalbuterol  0.63 mg Nebulization Q6H WA  . loratadine  10 mg Oral Daily  . morphine  30 mg Oral Q12H  . multivitamin with minerals  1 tablet Oral QHS  . pantoprazole  40 mg Oral BID AC  . piperacillin-tazobactam (ZOSYN)  IV  3.375 g Intravenous Q8H  . tiotropium  18 mcg Inhalation Daily   Infusions:   Assessment: 79 year old male w/ NSCLC stage IV with mets to brain, spine and pelvis. Admitted on 10/21 with new right LE weakness. Found to have new pathological lumbar fracture. Began palliative XRT on 10/22. Moved to SDU on 10/24 after developing spontaneous right pneumothorax/ hydropneumothorax. Beginning Zosyn per Pharmacy protocol for  presumed postobstructive pneumonia.  10/21 >> Diflucan >> 10/23 10/24 >> Zosyn >>  Tmax: Afeb WBC= no new labs, 10/27 10.4 (on decadron) Renal: no new labs, 10/27 Scr=1.14, CrCl 86ml/min PCT: 0.12 --> 0.1 --> 0.14  (no cultures)  Goal of Therapy:  Eradication of infection Dose per renal function  Plan:  Day #7 Zosyn Zosyn 3.375gm IV q8h (4hr extended infusions) Follow up renal function & cultures  Juliette Alcide, PharmD, BCPS.   Pager: [phone] 01/05/2013,8:45 AM

## 2013-01-05 NOTE — Progress Notes (Signed)
Subjective: Patient reports continued improvement in pain, mobility in right leg. Still has pain when attempting adduction, flexion at hip. As with initial exam, patient indicates area just inferior to ASIS, radiating across to medial aspect of knee "and back again". He reports improved strength in right leg, but still not able to bear weight safely. He reports no back pain, or symptoms that would be considered radicular.   Objective: Vital signs in last 24 hours: Temp:  [97.3 F (36.3 C)-97.6 F (36.4 C)] 97.3 F (36.3 C) (10/30 0452) Pulse Rate:  [100-117] 100 (10/30 0452) Resp:  [18] 18 (10/30 0452) BP: (110-126)/(50-75) 117/75 mmHg (10/30 0452) SpO2:  [97 %-98 %] 98 % (10/30 0452)  Intake/Output from previous day: 10/29 0701 - 10/30 0700 In: 50 [IV Piggyback:50] Out: 1116 [Urine:865; Stool:1; Chest Tube:250] Intake/Output this shift:    Neurologic: Mental status: Alert, oriented, thought content appropriate, alertness: alert, orientation: time, date, person, place, affect: normal Cranial nerves: normal Motor: Improved: sartorius on the right able to adduct/flex to 20 degrees Resp: diminished breath sounds RLL and RUL Cardio: regular rate and rhythm Extremities: notable right hip flexor weakness, but improved compared to initial exam; good EHL, gastroc, AT strength, no ankle clonus   Lab Results:  Recent Labs  01/03/13 0455  WBC 10.4  HGB 7.8*  HCT 24.2*  PLT 168   BMET  Recent Labs  01/03/13 0455  NA 136  K 4.1  CL 103  CO2 22  GLUCOSE 104*  BUN 32*  CREATININE 1.14  CALCIUM 9.5    Studies/Results: Dg Chest 2 View  01/04/2013   CLINICAL DATA:  Short of breath, pneumonia, lung cancer and the pneumothorax  EXAM: CHEST  2 VIEW  COMPARISON:  Prior chest x-ray 01/03/2013  FINDINGS: Stable to incrementally enlarged right apical pneumothorax. Slight differences may be related to differences in patient positioning. Stable position of right basilar pigtail  thoracostomy tube. Stable position of the right subclavian approach port catheter. Large right upper lobe mass with adjacent patchy infiltrate is also unchanged. No new airspace consolidation, pleural effusion or pneumothorax. Stable cardiac and mediastinal contours. No acute osseous abnormality.  IMPRESSION: 1. Stable to incrementally enlarged right apical pneumothorax. The right basilar pigtail thoracostomy tube remains in good position. 2. Unchanged appearance of right upper lobe mass and adjacent patchy infiltrates likely reflecting a postobstructive changes versus lymphangitic spread of the disease.   Electronically Signed   By: Malachy Moan M.D.   On: 01/04/2013 09:16    Assessment/Plan: NSCLC, metastatic disease in right psoas, L3 compression fracture with mild retropulsion but no impingement of neural structures, with epidural tumor at the L3 level impinging on right foramen. Patient still with weakness, but not pain, getting RTx windowed to both spine and psoas as I understand it.     LOS: 10 days  still would not plan nerve block. Patient still weak, but improving. If patient develops radicular symptoms, would consider right L3 SNRB. If discharges home or rehab, would be happy to see as outpatient. Plan to check on him tomorrow, but will sign off for now. Appreciate the opportunity to help.    Gwynne Edinger 01/05/2013, 7:38 AM

## 2013-01-05 NOTE — Progress Notes (Signed)
PULMONARY  / CRITICAL CARE MEDICINE  Name: Joel Gibson MRN: [id number] DOB: 02-Apr-1931    ADMISSION DATE:  12/15/2012 CONSULTATION DATE:  10/24  REFERRING MD :  Jomarie Longs  PRIMARY SERVICE:   [facility]   CHIEF COMPLAINT:   Hydropneumothorax   BRIEF PATIENT DESCRIPTION:  79 year old male w/ NSCLC stage IV (mets to brain, spine and pelvis. Admitted on 10/21 with new right LE weakness. Found to have new pathological lumbar fracture by MRI. Began palliative XRT on 10/22. Moved to SDU on 10/24 after developing spontaneous right pneumothorax/ hydropneumothorax.   SIGNIFICANT EVENTS / STUDIES:  MR spine 10/20: 1. Pathologic burst fracture at L3 with some retropulsion of bone and extensive epidural tumor extending into the lateral recess and foramina bilaterally, right greater than left.2. Moderate to severe right lateral recess and foraminal stenosis at L3-4.3. Extensive retroperitoneal lymphadenopathy.4. Mild left foraminal narrowing at L3-4. 5. Extra medullary tumor extends into the psoas muscles bilaterally,right greater than left. 6. Heterogeneous cystic lesions are present within the sacrum and right iliac bone. These likely represent metastases as well.7. Multiple cysts are present within the kidneys bilaterally.  CT chest 10/24: 1. Limited evaluation for pulmonary embolism demonstrating no  evidence of central, lobar or proximal segmental sized pulmonary embolism at this time.  2. Progression of disease with marked enlargement of right upper lobe mass, development of right hilar and mediastinal lymphadenopathy in addition to marked left axillary and subpectoral lymphadenopathy, probable retroperitoneal lymphadenopathy, malignant  right pleural effusion with extensive right pleural nodularity, and widespread metastases throughout the liver. 3. Additional findings, as above.   LINES / TUBES: Port (chonic) Right wayne cath 10/24>>>  CULTURES:   ANTIBIOTICS: Zosyn 10/24>>>  SUBJECTIVE:   Still w LE weakness and pain Heimlich valve was placed but it leaked significant amt of fluid, so placed back to suction   VITAL SIGNS: Temp:  [97.3 F (36.3 C)-97.6 F (36.4 C)] 97.3 F (36.3 C) (10/30 0452) Pulse Rate:  [100-117] 100 (10/30 0452) Resp:  [18] 18 (10/30 0452) BP: (110-137)/(50-75) 137/58 mmHg (10/30 0924) SpO2:  [97 %-98 %] 98 % (10/30 0452)  PHYSICAL EXAMINATION: General:  Chronically ill appearing white male, not in acute distress. Neuro:  Awake, alert, oriented , rt le weakness HEENT:  [place], no JVD  Cardiovascular:  rrr Lungs: improved BS, large air leak via chest tube off suction Abdomen:  Non-tender  Musculoskeletal:  Intact, RLE weak   Recent Labs Lab 01/01/13 0430 01/02/13 0613 01/03/13 0455  NA 132* 136 136  K 3.8 4.1 4.1  CL 101 104 103  CO2 23 22 22   BUN 29* 30* 32*  CREATININE 0.94 1.03 1.14  GLUCOSE 127* 119* 104*    Recent Labs Lab 01/01/13 0430 01/02/13 0613 01/03/13 0455  HGB 8.4* 8.6* 7.8*  HCT 26.7* 27.3* 24.2*  WBC 10.7* 13.0* 10.4  PLT 190 202 168   Dg Chest 2 View  01/04/2013   CLINICAL DATA:  Short of breath, pneumonia, lung cancer and the pneumothorax  EXAM: CHEST  2 VIEW  COMPARISON:  Prior chest x-ray 01/03/2013  FINDINGS: Stable to incrementally enlarged right apical pneumothorax. Slight differences may be related to differences in patient positioning. Stable position of right basilar pigtail thoracostomy tube. Stable position of the right subclavian approach port catheter. Large right upper lobe mass with adjacent patchy infiltrate is also unchanged. No new airspace consolidation, pleural effusion or pneumothorax. Stable cardiac and mediastinal contours. No acute osseous abnormality.  IMPRESSION: 1. Stable to  incrementally enlarged right apical pneumothorax. The right basilar pigtail thoracostomy tube remains in good position. 2. Unchanged appearance of right upper lobe mass and adjacent patchy infiltrates likely reflecting  a postobstructive changes versus lymphangitic spread of the disease.   Electronically Signed   By: Malachy Moan M.D.   On: 01/04/2013 09:16   Dg Chest Port 1 View  01/05/2013   CLINICAL DATA:  Followup right hydro pneumothorax. Right lung carcinoma.  EXAM: PORTABLE CHEST - 1 VIEW  COMPARISON:  01/04/2013  FINDINGS: Right pleural catheter remains in place. A small 5-10% right apical pneumothorax is stable in size. Large mass again seen in the medial right upper lobe.  Left lung remains clear. Heart size is normal. Right-sided Port-A-Cath remains in appropriate position.  IMPRESSION: Stable small 5-10% right apical pneumothorax and right upper lobe mass.   Electronically Signed   By: Myles Rosenthal M.D.   On: 01/05/2013 07:50    ASSESSMENT / PLAN: Acute respiratory distress in the setting of Large right Hydropneumothorax. Improved with CT placement Post-obstructive PNA COPD Stage IV NSCLC w/ extensive mets to bone Evident mets to pleural space  Plan -Heimlich valve on chest tube not a good option. Discussed pulling the tube and following vs placement of Pleurex with intermittent drainage. His wife is uncomfortable with pulling the tube. I will ask IR whether we can leave the [facility] Catheter or if they recommend placing Pleurex.  -serial CXR -day 7 zosyn, ? D/c abx -cont Tarceva per Dr Asa Lente plans. Note that this may preclude him from getting hospice care -palliative XRT to spine    Summary: Need to either remove CT altogether or replace with a Pleurex. They prefer the latter option. Will discuss with IR   Levy Pupa, MD, PhD 01/05/2013, 9:54 AM [facility] [phone] or if no answer [phone]

## 2013-01-05 NOTE — Progress Notes (Signed)
CSW & RNCM, Cookie spoke with patient's wife & daughter at bedside re: discharge planning. Patient's wife now plans to take patient home with home health & private duty caregiver rather than SNF.   RNCM looking into possible LTACH ([facility]) as an option.   Unice Bailey, LCSW [facility] Clinical Social Worker cell #: [phone]

## 2013-01-05 NOTE — Progress Notes (Signed)
Nutrition Brief Note  Patient identified due to Length of Stay day 10   Wt Readings from Last 15 Encounters:  01/01/13 147 lb 0.8 oz (66.7 kg)  12/05/12 150 lb 1.6 oz (68.085 kg)  11/21/12 151 lb 4.8 oz (68.629 kg)  11/15/12 151 lb 9.6 oz (68.765 kg)  11/14/12 147 lb 14.4 oz (67.087 kg)  11/01/12 145 lb 15.1 oz (66.2 kg)  10/24/12 148 lb 6.4 oz (67.314 kg)  10/17/12 150 lb 12.8 oz (68.402 kg)  10/13/12 148 lb 12.8 oz (67.495 kg)  10/12/12 149 lb 6.4 oz (67.767 kg)  10/07/12 152 lb 8 oz (69.174 kg)  10/03/12 151 lb 9.6 oz (68.765 kg)  09/19/12 151 lb 12.8 oz (68.856 kg)  09/05/12 150 lb 12.8 oz (68.402 kg)  09/01/12 151 lb 4.8 oz (68.629 kg)    Body mass index is 23.03 kg/(m^2). Patient meets criteria for Normal Weight based on current BMI. Pt's weight appears to be stable over the past 4 months.  Current diet order is Regular, patient is consuming approximately 90-100% of meals at this time. Pt states his appetite is good and he is eating well. Pt denies any need for snacks or nutritional supplements at this time. Labs and medications reviewed.   No nutrition interventions warranted at this time. If nutrition issues arise, please consult RD.   Ian Malkin RD, LDN Inpatient Clinical Dietitian Pager: [phone] After Hours Pager: [phone]

## 2013-01-05 NOTE — Progress Notes (Signed)
Patient EA:[id number] MIKI BLANK      DOB: 10/23/1931      XBJ:[id number]  Patient heading to radiation in good spirits.  Noted discharge plans.  Pain controlled.  Reviewed Dr. Ollen Bowl note and appreciative of his services.  Will continue to support patient and spouse within the goals that they have set for themselves.  Dailynn Nancarrow L. Ladona Ridgel, MD MBA The Palliative Medicine Team at [facility] Phone: [phone] Pager: [phone]

## 2013-01-05 NOTE — Progress Notes (Addendum)
LATE ENTRY: Pt was seen at 12:30pm [facility] PROGRESS NOTE  Joel Gibson ZOX:[id number] DOB: [birth year] DOA: 12/14/2012 PCP: [name], MD Brief Narrative  Joel Gibson is a 79 y.o. 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 Subsequently developed resp distress and was found to have Hydropneumothorax in R lung, then was seen by Pulm and underwent emergent Chest tube placement, with improvement. Was also seen by palliative medicine, now improving, see details below.   Assessment/Plan:  Hydropneumothorax/Post obstructive pnuemonia -greatly appreciate PCCM consult -s/p Emergent chest tube 10/24, nursing had difficulties with Heimlich valve>> discussed with Dr. Delton Coombes and plan now is to place a Pleurex per radiology in a.m>> appreciate pulmonology assistance>> answered patient's wife's questions regarding pleurex -Day 7 of IV Zosyn, will plan to DC after last dose today -Repeat CXR today with stable small 5-10% right apical pneumothorax and right upper lobe mass   L3 Pathologic burst fracture with some retropulsion of bone and extensive epidural tumor as per MRI -with Right leg weakness/Pain-improving -continue decadron -Rad Onc/Dr Basilio Cairo following, started XRT10/22 for 10treatments total -CT head with vasogenic edema, will continue with decadron q6 PO -PT/OT  Following, SNF recommended -continue MSContin for pain -Appreciate Dr.Goldings assistance, Seen by Dr.Harkins for interventional pain consult >> followup per Dr. Ollen Bowl today 10/30 and no nerve block recommended at this time.  . Hypercalcemia  -secondary to malignancy/bone mets  -trending down on IVF, steroids   -improved, follow recheck  in am  . Extensive Metastases/Stage 4 non small cell Lung cancer  - Dr Arbutus Ped and Rad Onc following - prognosis appears rather poor - on Tarceva and getting Palliative XRT -s/p Palliative meeting 10/26, patient and wife now have rescinded DO NOT RESUSCITATE status, and he is a full code -long discussion with pt, and family and family given options of LTAC, snf, vs home with hh services -family decided on home with home health services when hE is discharged -CIR declined pt  . Hypertension  -continue outpt meds   . GERD (gastroesophageal reflux disease)  -continue on PPI   . COPD (chronic obstructive pulmonary disease)  - continue outpt meds, add prn nebs   . Anemia of chronic disease  -stable, monitor   Constipation -Continue Miralax   Global: Given Advanced stage Lung CA with Mets to Brain, spine, liver , progressive disease on CTA chest, s/p Palliative consult for Goals of care  Code Status: full Family Communication: wife, daughter at bedside  Disposition Plan: likely to home when ok per PULM   Consultants:  Neuro  Radiation -onc  onc   Pulm  Dr.Paul Harkins  Procedures:  Chest tube 10/24  XRT to Lumbar spine  Antibiotics:  Zosyn 10/24  HPI/Subjective: Patient states he he wants everything done-that he does not want to be DO NOT RESUSCITATE-his wife is at bedside and supportive of his decision. Patient denies any new complaints. Wife asking appropriate questions about the PleurEx Objective: Filed Vitals:   01/05/13 1500  BP: 133/60  Pulse: 108  Temp: 97.7 F (36.5 C)  Resp: 18    Intake/Output Summary (Last 24 hours) at 01/05/13 1859 Last data filed at 01/05/13 1130  Gross per 24 hour  Intake  340 ml  Output    710 ml  Net   -370 ml   Filed Weights   01/02/2013 1712 12/31/12 0400 01/01/13 0400  Weight: 66 kg (145 lb 8.1 oz) 67.7 kg (149 lb 4 oz) 66.7 kg (147 lb 0.8 oz)    Exam:  General: alert & oriented x 3, mild  distress Cardiovascular: RRR, nl S1 s2 Respiratory: decreased BS at bases, occasional ronchi Abdomen: soft +BS NT/ND, no masses palpable Extremities: No cyanosis and no edema, RLE foot strength 3/5 and rest of R.Leg 4-5/5, L side strength 5/5     Data Reviewed: Basic Metabolic Panel:  Recent Labs Lab 12/31/12 0730 01/01/13 0430 01/02/13 0613 01/03/13 0455  NA 136 132* 136 136  K 4.1 3.8 4.1 4.1  CL 103 101 104 103  CO2 23 23 22 22   GLUCOSE 112* 127* 119* 104*  BUN 21 29* 30* 32*  CREATININE 0.84 0.94 1.03 1.14  CALCIUM 10.0 9.4 9.8 9.5   Liver Function Tests: No results found for this basename: AST, ALT, ALKPHOS, BILITOT, PROT, ALBUMIN,  in the last 168 hours No results found for this basename: LIPASE, AMYLASE,  in the last 168 hours No results found for this basename: AMMONIA,  in the last 168 hours CBC:  Recent Labs Lab 12/31/12 0730 01/01/13 0430 01/02/13 0613 01/03/13 0455  WBC 11.9* 10.7* 13.0* 10.4  HGB 9.0* 8.4* 8.6* 7.8*  HCT 28.8* 26.7* 27.3* 24.2*  MCV 100.3* 100.0 101.1* 101.7*  PLT 211 190 202 168   Cardiac Enzymes: No results found for this basename: CKTOTAL, CKMB, CKMBINDEX, TROPONINI,  in the last 168 hours BNP (last 3 results) No results found for this basename: PROBNP,  in the last 8760 hours CBG: No results found for this basename: GLUCAP,  in the last 168 hours  Recent Results (from the past 240 hour(s))  MRSA PCR SCREENING     Status: None   Collection Time    12/30/12  1:16 PM      Result Value Range Status   MRSA by PCR NEGATIVE  NEGATIVE Final   Comment:            The GeneXpert MRSA Assay (FDA     approved for NASAL specimens     only), is one component of a     comprehensive MRSA colonization     surveillance program. It is not     intended to diagnose MRSA     infection nor to guide or     monitor treatment for     MRSA infections.     Studies: Dg Chest 2 View  01/04/2013   CLINICAL DATA:  Short of breath, pneumonia,  lung cancer and the pneumothorax  EXAM: CHEST  2 VIEW  COMPARISON:  Prior chest x-ray 01/03/2013  FINDINGS: Stable to incrementally enlarged right apical pneumothorax. Slight differences may be related to differences in patient positioning. Stable position of right basilar pigtail thoracostomy tube. Stable position of the right subclavian approach port catheter. Large right upper lobe mass with adjacent patchy infiltrate is also unchanged. No new airspace consolidation, pleural effusion or pneumothorax. Stable cardiac and mediastinal contours. No acute osseous abnormality.  IMPRESSION: 1. Stable to incrementally enlarged right apical pneumothorax. The right basilar pigtail thoracostomy tube remains in good position. 2. Unchanged appearance of right upper lobe mass and adjacent patchy infiltrates likely reflecting a postobstructive changes versus lymphangitic spread of the disease.   Electronically Signed   By: Isac Caddy.D.  On: 01/04/2013 09:16   Dg Chest Port 1 View  01/05/2013   CLINICAL DATA:  Followup right hydro pneumothorax. Right lung carcinoma.  EXAM: PORTABLE CHEST - 1 VIEW  COMPARISON:  01/04/2013  FINDINGS: Right pleural catheter remains in place. A small 5-10% right apical pneumothorax is stable in size. Large mass again seen in the medial right upper lobe.  Left lung remains clear. Heart size is normal. Right-sided Port-A-Cath remains in appropriate position.  IMPRESSION: Stable small 5-10% right apical pneumothorax and right upper lobe mass.   Electronically Signed   By: Myles Rosenthal M.D.   On: 01/05/2013 07:50    Scheduled Meds: . amLODipine  2.5 mg Oral Daily  . aspirin EC  81 mg Oral QODAY  . atorvastatin  20 mg Oral q1800  . betaxolol  1 drop Both Eyes Daily  . betaxolol  1 drop Right Eye QHS  . dexamethasone  4 mg Oral Q8H  . dipyridamole-aspirin  1 capsule Oral QHS  . erlotinib  150 mg Oral Daily  . irbesartan  300 mg Oral Daily  . levalbuterol  0.63 mg Nebulization Q6H  WA  . loratadine  10 mg Oral Daily  . morphine  30 mg Oral Q12H  . multivitamin with minerals  1 tablet Oral QHS  . pantoprazole  40 mg Oral BID AC  . piperacillin-tazobactam (ZOSYN)  IV  3.375 g Intravenous Q8H  . tiotropium  18 mcg Inhalation Daily   Continuous Infusions:    Principal Problem:   Hydropneumothorax Active Problems:   Hypertension   Lung cancer   COPD (chronic obstructive pulmonary disease)   GERD (gastroesophageal reflux disease)   Bone metastases   Anemia of chronic disease   Right leg weakness   Hypercalcemia   Acute lumbar radiculopathy   Metastasis from malignant tumor of lung   Acute respiratory distress   Encounter for chest tube placement    Time spent: 35    [facility]  [facility] Pager [phone]. If 7PM-7AM, please contact night-coverage at [email or web address], password [facility] 01/05/2013, 6:59 PM  LOS: 10 days

## 2013-01-06 ENCOUNTER — Ambulatory Visit: Payer: Medicare Other

## 2013-01-06 ENCOUNTER — Ambulatory Visit
Admit: 2013-01-06 | Discharge: 2013-01-06 | Disposition: A | Discharge status: Home / Self Care | Payer: Medicare Other | Attending: Radiation Oncology | Admitting: Radiation Oncology

## 2013-01-06 ENCOUNTER — Inpatient Hospital Stay (HOSPITAL_COMMUNITY): Payer: Medicare Other

## 2013-01-06 LAB — CBC
HCT: 24.3 % — ABNORMAL LOW (ref 39.0–52.0)
Hemoglobin: 7.6 g/dL — ABNORMAL LOW (ref 13.0–17.0)
MCV: 102.1 fL — ABNORMAL HIGH (ref 78.0–100.0)
WBC: 9.5 10*3/uL (ref 4.0–10.5)

## 2013-01-06 LAB — PROTIME-INR: Prothrombin Time: 13.8 seconds (ref 11.6–15.2)

## 2013-01-06 LAB — APTT: aPTT: 30 seconds (ref 24–37)

## 2013-01-06 NOTE — Progress Notes (Signed)
OT Cancellation Note  Patient Details Name: Joel Gibson MRN: [id number] DOB: 05-16-1931   Cancelled Treatment:    Reason Eval/Treat Not Completed: Other (comment).  Pt did not feel like getting up at this time.  Got SOB when getting to commode earlier. (HgB 7.6).  Will check back later today if schedule permits.  [name] 01/06/2013, 10:16 AM [name], OTR/L [phone] 01/06/2013

## 2013-01-06 NOTE — Progress Notes (Addendum)
LATE ENTRY: Pt was seen at 12:30pm [facility] PROGRESS NOTE  Joel Gibson MVH:[id number] DOB: 1931-04-22 DOA: 12/20/2012 PCP: [name], MD Brief Narrative  Joel Gibson is a 79 y.o. 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 Subsequently developed resp distress and was found to have Hydropneumothorax in R lung, then was seen by Pulm and underwent emergent Chest tube placement, with improvement. Was also seen by palliative medicine, now improving, see details below.   Assessment/Plan:  Hydropneumothorax/Post obstructive pnuemonia -greatly appreciate PCCM consult -s/p Emergent chest tube 10/24, nursing had difficulties with Heimlich valve>> discussed with Dr. Delton Coombes and plan now is to place a Pleurex per radiology in a.m>> appreciate pulmonology assistance>> answered patient's wife's questions regarding pleurex -Day 7 of IV Zosyn, will plan to DC after last dose today -Repeat CXR today 10/31 is not enough fluid for pleurex done>> plan per pulmonary now to clamp tube and follow serial chest x-rays until there is enough pleural space for pleurex insertion   L3 Pathologic burst fracture with some retropulsion of bone and extensive epidural tumor as per MRI -with Right leg weakness/Pain-improving -continue decadron -Rad Onc/Dr Basilio Cairo following, started XRT10/22 for 10treatments total -CT head with vasogenic edema, will continue with decadron q6 PO -PT/OT  Following, SNF recommended -continue MSContin for pain -Appreciate Dr.Goldings assistance, Seen by Dr.Harkins for interventional pain consult >> followup per Dr. Ollen Bowl today 10/30 and no nerve block recommended at this time.  . Hypercalcemia   -secondary to malignancy/bone mets  -trending down on IVF, steroids   -resolved  . Extensive Metastases/Stage 4 non small cell Lung cancer  - Dr Arbutus Ped and Rad Onc following - prognosis appears rather poor - on Tarceva and getting Palliative XRT -s/p Palliative meeting 10/26, patient and wife now have rescinded DO NOT RESUSCITATE status, and he is a full code -long discussion with pt, and family and family given options of LTAC, snf, vs home with hh services -family decided on home with home health services when hE is discharged -CIR declined pt  . Hypertension  -continue outpt meds   . GERD (gastroesophageal reflux disease)  -continue on PPI   . COPD (chronic obstructive pulmonary disease)  - continue outpt meds, add prn nebs   . Anemia of chronic disease  -stable, monitor   Constipation -Continue Miralax   Global: Given Advanced stage Lung CA with Mets to Brain, spine, liver , progressive disease on CTA chest, s/p Palliative consult for Goals of care  Code Status: full Family Communication: wife, daughter at bedside  Disposition Plan: likely to home when ok per PULM   Consultants:  Neuro  Radiation -onc  onc   Pulm  Dr.Paul Harkins  Procedures:  Chest tube 10/24  XRT to Lumbar spine  Antibiotics:  Zosyn 10/24>>10/30  HPI/Subjective: Patient denies any complaints, disappointed that pleurex cannot be done today Objective: Filed Vitals:   01/06/13 0445  BP: 114/67  Pulse: 108  Temp: 98.1 F (36.7 C)  Resp: 18    Intake/Output Summary (Last 24 hours) at 01/06/13 1408 Last data filed at 01/06/13 1200  Gross per 24 hour  Intake     50 ml  Output    975 ml  Net   -925 ml   Filed Weights   12/18/2012 1712 12/31/12 0400 01/01/13 0400  Weight: 66 kg (145 lb 8.1 oz) 67.7 kg (149 lb 4 oz) 66.7 kg (147 lb 0.8 oz)    Exam:  General: alert & oriented x 3, in NAD Cardiovascular: RRR, nl S1 s2 Respiratory: decreased BS at bases, occasional  ronchi Abdomen: soft +BS NT/ND, no masses palpable Extremities: No cyanosis and no edema, RLE foot strength 3/5 and rest of R.Leg 4-5/5, L side strength 5/5     Data Reviewed: Basic Metabolic Panel:  Recent Labs Lab 12/31/12 0730 01/01/13 0430 01/02/13 0613 01/03/13 0455  NA 136 132* 136 136  K 4.1 3.8 4.1 4.1  CL 103 101 104 103  CO2 23 23 22 22   GLUCOSE 112* 127* 119* 104*  BUN 21 29* 30* 32*  CREATININE 0.84 0.94 1.03 1.14  CALCIUM 10.0 9.4 9.8 9.5   Liver Function Tests: No results found for this basename: AST, ALT, ALKPHOS, BILITOT, PROT, ALBUMIN,  in the last 168 hours No results found for this basename: LIPASE, AMYLASE,  in the last 168 hours No results found for this basename: AMMONIA,  in the last 168 hours CBC:  Recent Labs Lab 12/31/12 0730 01/01/13 0430 01/02/13 0613 01/03/13 0455 01/06/13 0350  WBC 11.9* 10.7* 13.0* 10.4 9.5  HGB 9.0* 8.4* 8.6* 7.8* 7.6*  HCT 28.8* 26.7* 27.3* 24.2* 24.3*  MCV 100.3* 100.0 101.1* 101.7* 102.1*  PLT 211 190 202 168 148*   Cardiac Enzymes: No results found for this basename: CKTOTAL, CKMB, CKMBINDEX, TROPONINI,  in the last 168 hours BNP (last 3 results) No results found for this basename: PROBNP,  in the last 8760 hours CBG: No results found for this basename: GLUCAP,  in the last 168 hours  Recent Results (from the past 240 hour(s))  MRSA PCR SCREENING     Status: None   Collection Time    12/30/12  1:16 PM      Result Value Range Status   MRSA by PCR NEGATIVE  NEGATIVE Final   Comment:            The GeneXpert MRSA Assay (FDA     approved for NASAL specimens     only), is one component of a     comprehensive MRSA colonization     surveillance program. It is not     intended to diagnose MRSA     infection nor to guide or     monitor treatment for     MRSA infections.     Studies: Dg Chest Port 1 View  01/06/2013   CLINICAL DATA:  chest tube placement with pneumothorax  EXAM: PORTABLE CHEST - 1 VIEW   COMPARISON:  January 05, 2013  FINDINGS: The right chest tube position unchanged. The right apical pneumothorax is again noted without appreciable change. There is no tension component.  The mass in the right upper lobe with surrounding pneumonitis remains. There is underlying emphysema. Left lung is clear. Heart size is normal. Pulmonary vascularity reflects underlying emphysema. Port-A-Cath tip is in the superior vena cava.  IMPRESSION: Persistent right apical pneumothorax with a small amount of pneumothorax seen along the superolateral aspect of the right hemi thorax, not felt to be appreciably changed allowing for slight difference in position. Persistent mass with patchy pneumonitis right upper lobe. No change in chest tube position. Left lung clear. Underlying emphysema.   Electronically Signed   By: Bretta Bang M.D.  On: 01/06/2013 09:47   Dg Chest Port 1 View  01/05/2013   CLINICAL DATA:  Followup right hydro pneumothorax. Right lung carcinoma.  EXAM: PORTABLE CHEST - 1 VIEW  COMPARISON:  01/04/2013  FINDINGS: Right pleural catheter remains in place. A small 5-10% right apical pneumothorax is stable in size. Large mass again seen in the medial right upper lobe.  Left lung remains clear. Heart size is normal. Right-sided Port-A-Cath remains in appropriate position.  IMPRESSION: Stable small 5-10% right apical pneumothorax and right upper lobe mass.   Electronically Signed   By: Myles Rosenthal M.D.   On: 01/05/2013 07:50    Scheduled Meds: . amLODipine  2.5 mg Oral Daily  . aspirin EC  81 mg Oral QODAY  . atorvastatin  20 mg Oral q1800  . betaxolol  1 drop Both Eyes Daily  . betaxolol  1 drop Right Eye QHS  . dexamethasone  4 mg Oral Q8H  . dipyridamole-aspirin  1 capsule Oral QHS  . erlotinib  150 mg Oral Daily  . irbesartan  300 mg Oral Daily  . levalbuterol  0.63 mg Nebulization Q6H WA  . loratadine  10 mg Oral Daily  . morphine  30 mg Oral Q12H  . multivitamin with minerals  1  tablet Oral QHS  . pantoprazole  40 mg Oral BID AC  . tiotropium  18 mcg Inhalation Daily   Continuous Infusions:    Principal Problem:   Hydropneumothorax Active Problems:   Hypertension   Lung cancer   COPD (chronic obstructive pulmonary disease)   GERD (gastroesophageal reflux disease)   Bone metastases   Anemia of chronic disease   Right leg weakness   Hypercalcemia   Acute lumbar radiculopathy   Metastasis from malignant tumor of lung   Acute respiratory distress   Encounter for chest tube placement    Time spent: 25    [facility]  [facility] Pager [phone]. If 7PM-7AM, please contact night-coverage at [email or web address], password [facility] 01/06/2013, 2:08 PM  LOS: 11 days

## 2013-01-06 NOTE — Progress Notes (Signed)
PT NOTE  Attempted PT tx session this am. Pt declined to participate. Will check back as schedule permits. Thanks. Rebeca Alert, PT [phone]

## 2013-01-06 NOTE — Progress Notes (Signed)
Patient WJ:XBJYNW NORMAN PIACENTINI      DOB: [birth year]      GNF:[id number]   Stopped by to offer emotional support.  Patient had tough afternoon with some shortness of breath when chest tube was clamped .  Currently, feeling ok.  Spouse relates feels comfortable with plans to go home with home health.     Heyli Min L. Ladona Ridgel, MD MBA The Palliative Medicine Team at [facility] Phone: [phone] Pager: [phone]

## 2013-01-06 NOTE — Progress Notes (Signed)
Spoke with Dr. Delton Coombes and informed of pt condition after chest tube was clamped.  Chest tube to low suction as before and pt resting comfortably.

## 2013-01-06 NOTE — Progress Notes (Signed)
PULMONARY  / CRITICAL CARE MEDICINE  Name: Joel Gibson MRN: [id number] DOB: 1931-07-24    ADMISSION DATE:  12-30-2012 CONSULTATION DATE:  10/24  REFERRING MD :  Jomarie Longs  PRIMARY SERVICE:   [facility]   CHIEF COMPLAINT:   Hydropneumothorax   BRIEF PATIENT DESCRIPTION:  79 year old male w/ NSCLC stage IV (mets to brain, spine and pelvis. Admitted on 10/21 with new right LE weakness. Found to have new pathological lumbar fracture by MRI. Began palliative XRT on 10/22. Moved to SDU on 10/24 after developing spontaneous right pneumothorax/ hydropneumothorax.   SIGNIFICANT EVENTS / STUDIES:  MR spine 10/20: 1. Pathologic burst fracture at L3 with some retropulsion of bone and extensive epidural tumor extending into the lateral recess and foramina bilaterally, right greater than left.2. Moderate to severe right lateral recess and foraminal stenosis at L3-4.3. Extensive retroperitoneal lymphadenopathy.4. Mild left foraminal narrowing at L3-4. 5. Extra medullary tumor extends into the psoas muscles bilaterally,right greater than left. 6. Heterogeneous cystic lesions are present within the sacrum and right iliac bone. These likely represent metastases as well.7. Multiple cysts are present within the kidneys bilaterally.  CT chest 10/24: 1. Limited evaluation for pulmonary embolism demonstrating no  evidence of central, lobar or proximal segmental sized pulmonary embolism at this time.  2. Progression of disease with marked enlargement of right upper lobe mass, development of right hilar and mediastinal lymphadenopathy in addition to marked left axillary and subpectoral lymphadenopathy, probable retroperitoneal lymphadenopathy, malignant  right pleural effusion with extensive right pleural nodularity, and widespread metastases throughout the liver. 3. Additional findings, as above.  1031 having plueryx catheter placed. LINES / TUBES: Port (chonic) Right wayne cath 10/24>>>10-31 Rt plueryx  10-31>>  CULTURES:   ANTIBIOTICS: Zosyn 10/24>>>  SUBJECTIVE:  Still w LE weakness and pain Heimlich valve was placed but it leaked significant amt of fluid, so placed back to suction   VITAL SIGNS: Temp:  [97.7 F (36.5 C)-98.1 F (36.7 C)] 98.1 F (36.7 C) (10/31 0445) Pulse Rate:  [108-109] 108 (10/31 0445) Resp:  [18] 18 (10/31 0445) BP: (114-133)/(60-67) 114/67 mmHg (10/31 0445) SpO2:  [93 %-97 %] 93 % (10/31 1055)  PHYSICAL EXAMINATION: General:  Chronically ill appearing white male, not in acute distress. Neuro:  Awake, alert, oriented , rt le weakness HEENT:  [place], no JVD  Cardiovascular:  rrr Lungs: improved BS,  Abdomen:  Non-tender  Musculoskeletal:  Intact, RLE weak   Recent Labs Lab 01/01/13 0430 01/02/13 0613 01/03/13 0455  NA 132* 136 136  K 3.8 4.1 4.1  CL 101 104 103  CO2 23 22 22   BUN 29* 30* 32*  CREATININE 0.94 1.03 1.14  GLUCOSE 127* 119* 104*    Recent Labs Lab 01/02/13 0613 01/03/13 0455 01/06/13 0350  HGB 8.6* 7.8* 7.6*  HCT 27.3* 24.2* 24.3*  WBC 13.0* 10.4 9.5  PLT 202 168 148*   Dg Chest Port 1 View  01/06/2013   CLINICAL DATA:  chest tube placement with pneumothorax  EXAM: PORTABLE CHEST - 1 VIEW  COMPARISON:  January 05, 2013  FINDINGS: The right chest tube position unchanged. The right apical pneumothorax is again noted without appreciable change. There is no tension component.  The mass in the right upper lobe with surrounding pneumonitis remains. There is underlying emphysema. Left lung is clear. Heart size is normal. Pulmonary vascularity reflects underlying emphysema. Port-A-Cath tip is in the superior vena cava.  IMPRESSION: Persistent right apical pneumothorax with a small amount of pneumothorax seen  along the superolateral aspect of the right hemi thorax, not felt to be appreciably changed allowing for slight difference in position. Persistent mass with patchy pneumonitis right upper lobe. No change in chest tube position.  Left lung clear. Underlying emphysema.   Electronically Signed   By: Bretta Bang M.D.   On: 01/06/2013 09:47   Dg Chest Port 1 View  01/05/2013   CLINICAL DATA:  Followup right hydro pneumothorax. Right lung carcinoma.  EXAM: PORTABLE CHEST - 1 VIEW  COMPARISON:  01/04/2013  FINDINGS: Right pleural catheter remains in place. A small 5-10% right apical pneumothorax is stable in size. Large mass again seen in the medial right upper lobe.  Left lung remains clear. Heart size is normal. Right-sided Port-A-Cath remains in appropriate position.  IMPRESSION: Stable small 5-10% right apical pneumothorax and right upper lobe mass.   Electronically Signed   By: Myles Rosenthal M.D.   On: 01/05/2013 07:50    ASSESSMENT / PLAN: Acute respiratory distress in the setting of Large right Hydropneumothorax. Improved with CT placement Post-obstructive PNA COPD Stage IV NSCLC w/ extensive mets to bone Evident mets to pleural space  Plan -Heimlich valve on chest tube not a good option. Discussed pulling the tube and following vs placement of Pleurex with intermittent drainage. His wife is uncomfortable with pulling the tube. He is unable to get Pleurex for now because he doesn't have enough fluid (or air) to allow placement. I will clamp his existing tube, follow CXR until there is enough pleural space for safe pleurex insertion, then call IR to place it.  -serial CXR -cont Tarceva per Dr Asa Lente plans. Note that this may preclude him from getting hospice care -palliative XRT to spine    Levy Pupa, MD, PhD 01/06/2013, 11:26 AM [facility] [phone] or if no answer [phone]

## 2013-01-06 NOTE — Progress Notes (Signed)
Chest tube clamped per MD order at 1350.  Approximately 10 minutes later pt complained of SOB and was very dyspneic.  O2 started at 2liters and chest tube unclamped.  Immediate relief was noted both per patient and visually as well. Dr. Delton Coombes paged, will continue to notify.

## 2013-01-06 NOTE — Progress Notes (Signed)
Patient ID: Joel Gibson, male   DOB: 06-27-1931, 79 y.o.   MRN: [id number] Request received for placement of a right pleurx catheter in pt with hx of stage 4 NSC lung carcinoma, spontaneous right pneumothorax/hydropneumothorax and placement of right chest tube by CCM on 12/30/12. Recent imaging studies have been reviewed by Dr. Miles Costain. At this time pt has persistent rt apical ptx , air leak on pleuravac to wall suction and no significant right pleural effusion. He is not a candidate for placement of a tunneled right pleurx catheter due to these findings. If ptx resolves and effusion recurs can pursue placement of pleurx. Above d/w pt/wife. Please contact IR at [phone] or Dr. Miles Costain [phone] with any questions.

## 2013-01-07 ENCOUNTER — Inpatient Hospital Stay (HOSPITAL_COMMUNITY): Payer: Medicare Other

## 2013-01-07 LAB — BASIC METABOLIC PANEL
BUN: 31 mg/dL — ABNORMAL HIGH (ref 6–23)
Chloride: 107 mEq/L (ref 96–112)
Creatinine, Ser: 0.97 mg/dL (ref 0.50–1.35)
Glucose, Bld: 122 mg/dL — ABNORMAL HIGH (ref 70–99)
Potassium: 4 mEq/L (ref 3.5–5.1)

## 2013-01-07 NOTE — Progress Notes (Signed)
PULMONARY  / CRITICAL CARE MEDICINE  Name: Joel Gibson MRN: [id number] DOB: 1932/03/05    ADMISSION DATE:  Jan 24, 2013 CONSULTATION DATE:  10/24  REFERRING MD :  Jomarie Longs  PRIMARY SERVICE:   [facility]   CHIEF COMPLAINT:   Hydropneumothorax   BRIEF PATIENT DESCRIPTION:  79 year old male w/ NSCLC stage IV (mets to brain, spine and pelvis. Admitted on 10/21 with new right LE weakness. Found to have new pathological lumbar fracture by MRI. Began palliative XRT on 10/22. Moved to SDU on 10/24 after developing spontaneous right pneumothorax/ hydropneumothorax.   SIGNIFICANT EVENTS / STUDIES:  MR spine 10/20: 1. Pathologic burst fracture at L3 with some retropulsion of bone and extensive epidural tumor extending into the lateral recess and foramina bilaterally, right greater than left.2. Moderate to severe right lateral recess and foraminal stenosis at L3-4.3. Extensive retroperitoneal lymphadenopathy.4. Mild left foraminal narrowing at L3-4. 5. Extra medullary tumor extends into the psoas muscles bilaterally,right greater than left. 6. Heterogeneous cystic lesions are present within the sacrum and right iliac bone. These likely represent metastases as well.7. Multiple cysts are present within the kidneys bilaterally.  CT chest 10/24: 1. Limited evaluation for pulmonary embolism demonstrating no  evidence of central, lobar or proximal segmental sized pulmonary embolism at this time.  2. Progression of disease with marked enlargement of right upper lobe mass, development of right hilar and mediastinal lymphadenopathy in addition to marked left axillary and subpectoral lymphadenopathy, probable retroperitoneal lymphadenopathy, malignant  right pleural effusion with extensive right pleural nodularity, and widespread metastases throughout the liver. 3. Additional findings, as above.  1031 having plueryx catheter placed. LINES / TUBES: Port (chonic) Right wayne cath 10/24>>>10-31 Rt plueryx  10-31>>  CULTURES:   ANTIBIOTICS: Zosyn 10/24>>>  SUBJECTIVE:  Still w LE weakness and pain Heimlich valve was placed but it leaked significant amt of fluid, so placed back to suction   VITAL SIGNS: Temp:  [97.7 F (36.5 C)-98.2 F (36.8 C)] 98.1 F (36.7 C) (11/01 0440) Pulse Rate:  [114-118] 115 (11/01 0440) Resp:  [18-20] 18 (11/01 0440) BP: (98-111)/(55-61) 98/57 mmHg (11/01 0440) SpO2:  [96 %-100 %] 98 % (11/01 0840)  PHYSICAL EXAMINATION: General:  Chronically ill appearing white male, not in acute distress. Neuro:  Awake, alert, oriented , rt le weakness HEENT:  [place], no JVD  Cardiovascular:  rrr Lungs: improved BS,  Abdomen:  Non-tender  Musculoskeletal:  Intact, RLE weak   Recent Labs Lab 01/02/13 0613 01/03/13 0455 01/07/13 0510  NA 136 136 138  K 4.1 4.1 4.0  CL 104 103 107  CO2 22 22 22   BUN 30* 32* 31*  CREATININE 1.03 1.14 0.97  GLUCOSE 119* 104* 122*    Recent Labs Lab 01/02/13 0613 01/03/13 0455 01/06/13 0350  HGB 8.6* 7.8* 7.6*  HCT 27.3* 24.2* 24.3*  WBC 13.0* 10.4 9.5  PLT 202 168 148*   Dg Chest Port 1 View  01/07/2013   CLINICAL DATA:  Evaluate pneumothorax.  EXAM: PORTABLE CHEST - 1 VIEW  COMPARISON:  01/06/2013.  FINDINGS: Stable to slightly smaller, small right pneumothorax, located apically. The gas is approximately 1 interspace in thickness. Right peritracheal mass and surrounding airspace disease is stable. Right basilar chest tube in unchanged position. Stable heart size. Clear left lung, although hyperinflated. Remote trauma to the right clavicle and ribs.  IMPRESSION: Stable to slightly smaller right pneumothorax. There is new subcutaneous emphysema around the chest tube.   Electronically Signed   By: Tiburcio Pea  M.D.   On: 01/07/2013 06:09   Dg Chest Port 1 View  01/06/2013   CLINICAL DATA:  chest tube placement with pneumothorax  EXAM: PORTABLE CHEST - 1 VIEW  COMPARISON:  January 05, 2013  FINDINGS: The right chest tube  position unchanged. The right apical pneumothorax is again noted without appreciable change. There is no tension component.  The mass in the right upper lobe with surrounding pneumonitis remains. There is underlying emphysema. Left lung is clear. Heart size is normal. Pulmonary vascularity reflects underlying emphysema. Port-A-Cath tip is in the superior vena cava.  IMPRESSION: Persistent right apical pneumothorax with a small amount of pneumothorax seen along the superolateral aspect of the right hemi thorax, not felt to be appreciably changed allowing for slight difference in position. Persistent mass with patchy pneumonitis right upper lobe. No change in chest tube position. Left lung clear. Underlying emphysema.   Electronically Signed   By: Bretta Bang M.D.   On: 01/06/2013 09:47    ASSESSMENT / PLAN: Acute respiratory distress in the setting of Large right Hydropneumothorax. Improved with CT placement Post-obstructive PNA COPD Stage IV NSCLC w/ extensive mets to bone Evident mets to pleural space  Plan -Heimlich valve on chest tube not a good option. Discussed pulling the tube and following vs placement of Pleurex with intermittent drainage. His wife is uncomfortable with pulling the tube. He is unable to get Pleurex for now because he doesn't have enough fluid (or air) to allow placement. I will clamp his existing tube, follow CXR until there is enough pleural space for safe pleurex insertion, then call IR to place it => unable to do so due to immediate pain & SOB w/ CT clamp, relief w/ hook up to suction... -serial CXR -cont Tarceva per Dr Asa Lente plans. Note that this may preclude him from getting hospice care -palliative XRT to spine    Gilles Trimpe M. Kriste Basque, MD  01/07/2013, 12:17 PM [facility]

## 2013-01-07 NOTE — Progress Notes (Signed)
LATE ENTRY: Pt was seen at 12:30pm [facility] PROGRESS NOTE  Joel Gibson ZOX:[id number] DOB: [birth year] DOA: 12/22/2012 PCP: [name], MD Brief Narrative  Joel Gibson is a 79 y.o. 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 Subsequently developed resp distress and was found to have Hydropneumothorax in R lung, then was seen by Pulm and underwent emergent Chest tube placement, with improvement. Was also seen by palliative medicine, now improving, see details below.   Assessment/Plan:  Hydropneumothorax/Post obstructive pnuemonia -greatly appreciate PCCM consult -s/p Emergent chest tube 10/24, nursing had difficulties with Heimlich valve>> discussed with Dr. Delton Coombes and plan now is to place a Pleurex per radiology in a.m>> appreciate pulmonology assistance>> answered patient's wife's questions regarding pleurex -Day 7 of IV Zosyn, will plan to DC after last dose today -Repeat CXR today 10/31 is not enough fluid for pleurex done>> plan per pulmonary plan was to clamp tube and follow serial chest x-rays until there is enough pleural space for pleurex insertion, but pt did not tolerate clamping and CT now back to suction>>Pulm to follow for further recs.  L3 Pathologic burst fracture with some retropulsion of bone and extensive epidural tumor as per MRI -with Right leg weakness/Pain-improving -continue decadron -Rad Onc/Dr Basilio Cairo following, started XRT10/22 for 10treatments total -CT head with vasogenic edema, will continue with decadron q6 PO -PT/OT  Following, SNF recommended -continue MSContin for pain -Appreciate Dr.Goldings assistance, Seen by Dr.Harkins for interventional pain consult >>  followup per Dr. Ollen Bowl today 10/30 and no nerve block recommended at this time.  . Hypercalcemia  -secondary to malignancy/bone mets  -trending down on IVF, steroids   -resolved  . Extensive Metastases/Stage 4 non small cell Lung cancer  - Dr Arbutus Ped and Rad Onc following - prognosis appears rather poor - on Tarceva and getting Palliative XRT -s/p Palliative meeting 10/26, patient and wife now have rescinded DO NOT RESUSCITATE status, and he is a full code -long discussion with pt, and family and family given options of LTAC, snf, vs home with hh services -family decided on home with home health services when hE is discharged -CIR declined pt  . Hypertension  -continue outpt meds   . GERD (gastroesophageal reflux disease)  -continue on PPI   . COPD (chronic obstructive pulmonary disease)  - continue outpt meds, add prn nebs   . Anemia of chronic disease  -stable, monitor   Constipation -Continue Miralax   Global: Given Advanced stage Lung CA with Mets to Brain, spine, liver , progressive disease on CTA chest, s/p Palliative consult for Goals of care>> but pt rescinded DNR status since then and does not want hospice services when dc'ed home.  Code Status: full Family Communication: wife, daughter at bedside  Disposition Plan: likely to home when ok per PULM   Consultants:  Neuro  Radiation -onc  onc   Pulm  Dr.Paul Harkins  Procedures:  Chest tube 10/24  XRT to Lumbar spine  Antibiotics:  Zosyn 10/24>>10/30  HPI/Subjective: States a little SOB today, denies pain Objective: Filed Vitals:   01/07/13 0440  BP: 98/57  Pulse: 115  Temp: 98.1 F (36.7 C)  Resp: 18    Intake/Output Summary (Last 24 hours)  at 01/07/13 1304 Last data filed at 01/07/13 0700  Gross per 24 hour  Intake    460 ml  Output    780 ml  Net   -320 ml   Filed Weights   01-03-13 1712 12/31/12 0400 01/01/13 0400  Weight: 66 kg (145 lb 8.1 oz) 67.7 kg (149 lb 4 oz) 66.7 kg  (147 lb 0.8 oz)    Exam:  General: alert & oriented x 3, in NAD, mildly dyspneic Cardiovascular: RRR, nl S1 s2 Respiratory: decreased BS at bases, occasional ronchi Abdomen: soft +BS NT/ND, no masses palpable Extremities: No cyanosis and no edema, RLE foot strength 3/5 and rest of R.Leg 4-5/5, L side strength 5/5     Data Reviewed: Basic Metabolic Panel:  Recent Labs Lab 01/01/13 0430 01/02/13 0613 01/03/13 0455 01/07/13 0510  NA 132* 136 136 138  K 3.8 4.1 4.1 4.0  CL 101 104 103 107  CO2 23 22 22 22   GLUCOSE 127* 119* 104* 122*  BUN 29* 30* 32* 31*  CREATININE 0.94 1.03 1.14 0.97  CALCIUM 9.4 9.8 9.5 9.9   Liver Function Tests: No results found for this basename: AST, ALT, ALKPHOS, BILITOT, PROT, ALBUMIN,  in the last 168 hours No results found for this basename: LIPASE, AMYLASE,  in the last 168 hours No results found for this basename: AMMONIA,  in the last 168 hours CBC:  Recent Labs Lab 01/01/13 0430 01/02/13 0613 01/03/13 0455 01/06/13 0350  WBC 10.7* 13.0* 10.4 9.5  HGB 8.4* 8.6* 7.8* 7.6*  HCT 26.7* 27.3* 24.2* 24.3*  MCV 100.0 101.1* 101.7* 102.1*  PLT 190 202 168 148*   Cardiac Enzymes: No results found for this basename: CKTOTAL, CKMB, CKMBINDEX, TROPONINI,  in the last 168 hours BNP (last 3 results) No results found for this basename: PROBNP,  in the last 8760 hours CBG: No results found for this basename: GLUCAP,  in the last 168 hours  Recent Results (from the past 240 hour(s))  MRSA PCR SCREENING     Status: None   Collection Time    12/30/12  1:16 PM      Result Value Range Status   MRSA by PCR NEGATIVE  NEGATIVE Final   Comment:            The GeneXpert MRSA Assay (FDA     approved for NASAL specimens     only), is one component of a     comprehensive MRSA colonization     surveillance program. It is not     intended to diagnose MRSA     infection nor to guide or     monitor treatment for     MRSA infections.     Studies: Dg  Chest Port 1 View  01/07/2013   CLINICAL DATA:  Evaluate pneumothorax.  EXAM: PORTABLE CHEST - 1 VIEW  COMPARISON:  01/06/2013.  FINDINGS: Stable to slightly smaller, small right pneumothorax, located apically. The gas is approximately 1 interspace in thickness. Right peritracheal mass and surrounding airspace disease is stable. Right basilar chest tube in unchanged position. Stable heart size. Clear left lung, although hyperinflated. Remote trauma to the right clavicle and ribs.  IMPRESSION: Stable to slightly smaller right pneumothorax. There is new subcutaneous emphysema around the chest tube.   Electronically Signed   By: Tiburcio Pea M.D.   On: 01/07/2013 06:09   Dg Chest Port 1 View  01/06/2013   CLINICAL DATA:  chest tube placement with pneumothorax  EXAM: PORTABLE  CHEST - 1 VIEW  COMPARISON:  January 05, 2013  FINDINGS: The right chest tube position unchanged. The right apical pneumothorax is again noted without appreciable change. There is no tension component.  The mass in the right upper lobe with surrounding pneumonitis remains. There is underlying emphysema. Left lung is clear. Heart size is normal. Pulmonary vascularity reflects underlying emphysema. Port-A-Cath tip is in the superior vena cava.  IMPRESSION: Persistent right apical pneumothorax with a small amount of pneumothorax seen along the superolateral aspect of the right hemi thorax, not felt to be appreciably changed allowing for slight difference in position. Persistent mass with patchy pneumonitis right upper lobe. No change in chest tube position. Left lung clear. Underlying emphysema.   Electronically Signed   By: Bretta Bang M.D.   On: 01/06/2013 09:47    Scheduled Meds: . amLODipine  2.5 mg Oral Daily  . aspirin EC  81 mg Oral QODAY  . atorvastatin  20 mg Oral q1800  . betaxolol  1 drop Both Eyes Daily  . betaxolol  1 drop Right Eye QHS  . dexamethasone  4 mg Oral Q8H  . dipyridamole-aspirin  1 capsule Oral QHS  .  erlotinib  150 mg Oral Daily  . irbesartan  300 mg Oral Daily  . levalbuterol  0.63 mg Nebulization Q6H WA  . loratadine  10 mg Oral Daily  . morphine  30 mg Oral Q12H  . multivitamin with minerals  1 tablet Oral QHS  . pantoprazole  40 mg Oral BID AC  . tiotropium  18 mcg Inhalation Daily   Continuous Infusions:    Principal Problem:   Hydropneumothorax Active Problems:   Hypertension   Lung cancer   COPD (chronic obstructive pulmonary disease)   GERD (gastroesophageal reflux disease)   Bone metastases   Anemia of chronic disease   Right leg weakness   Hypercalcemia   Acute lumbar radiculopathy   Metastasis from malignant tumor of lung   Acute respiratory distress   Encounter for chest tube placement    Time spent: 25    [facility]  [facility] Pager [phone]. If 7PM-7AM, please contact night-coverage at [email or web address], password [facility] 01/07/2013, 1:04 PM  LOS: 12 days

## 2013-01-07 NOTE — Progress Notes (Signed)
Physical Therapy Treatment Patient Details Name: Joel Gibson MRN: [id number] DOB: [birth year] Today's Date: 01/07/2013 Time: 4098-1191 PT Time Calculation (min): 28 min  PT Assessment / Plan / Recommendation  History of Present Illness Pt is a 79 y.o. male with multiple medical problems including h/o Lung ca diagnosed in 2013 (and followed by Dr Arbutus Ped) metastatic to brain and and bones and s/p stereotactic radiotherapy (followed by Dr Basilio Cairo), CVA, HTN, He states that he developed pain from his hip area down to knee and R. Leg weakness 3-4days prior to admission and it has worsened to where he is unable to move that leg. Per daughter he has had some weakness for the past 54mo   head CT showed- stable appearance of left for frontal lobe mass with surrounding vasogenic edema and no new hemorrhage.Pt has a L3 burst fx (no brace needed per MD) and tumor which is being treated by xrt.   PT Comments   Pt in bed on 2 lts O2 at 100% with family in room.  Pt not feeling well but did want to get OOB to [facility] for a BM.  Assisted + 2 for safety to [facility].  Pt sat there x 10 min.  Too fatigued to attempt amb, assisted pt to recliner.  Very limited activity tolerance and MAX c/o SOB.  VC's for purse lip breathing and emotional support to decrease mild anxiety.  Positioned in recliner and encouraged pt to stay up atleast 1 hour.  Instructed pt to call for nurse when he wanted to get back to bed.   Follow Up Recommendations  Supervision/Assistance - 24 hour (family plans to take pt home )     Does the patient have the potential to tolerate intense rehabilitation     Barriers to Discharge        Equipment Recommendations  Rolling walker with 5" wheels    Recommendations for Other Services    Frequency     Progress towards PT Goals Progress towards PT goals: Progressing toward goals  Plan      Precautions / Restrictions Precautions Precautions: Fall;Back Precaution Comments: adhere to back precautions  (log rolling, etc) for safety secondary to lumbar spine tumor Restrictions Weight Bearing Restrictions: No    Pertinent Vitals/Pain No c/o pain    Mobility  Bed Mobility Bed Mobility: Rolling Right;Right Sidelying to Sit;Sitting - Scoot to [place] of Bed Rolling Right: 4: Min assist Right Sidelying to Sit: 4: Min assist Sitting - Scoot to [place] of Bed: 4: Min assist Details for Bed Mobility Assistance: verbal cues for technique and increased time Transfers Transfers: Sit to Stand;Stand to Sit Sit to Stand: 4: Min assist (+ 2 for safety) Stand to Sit: 4: Min assist (+ 2 for safety) Details for Transfer Assistance: 25% VC's on proper tech and hand placement  Ambulation/Gait Ambulation/Gait Assistance Details: Unable to tolerate after sitting on BSC x 10 min     PT Goals (current goals can now be found in the care plan section)    Visit Information  Last PT Received On: 01/07/13 Assistance Needed: +2 History of Present Illness: Pt is a 79 y.o. male with multiple medical problems including h/o Lung ca diagnosed in 2013 (and followed by Dr Arbutus Ped) metastatic to brain and and bones and s/p stereotactic radiotherapy (followed by Dr Basilio Cairo), CVA, HTN, He states that he developed pain from his hip area down to knee and R. Leg weakness 3-4days prior to admission and it has worsened to where he is  unable to move that leg. Per daughter he has had some weakness for the past 78mo   head CT showed- stable appearance of left for frontal lobe mass with surrounding vasogenic edema and no new hemorrhage.Pt has a L3 burst fx (no brace needed per MD) and tumor which is being treated by xrt.    Subjective Data      Cognition       Balance     End of Session PT - End of Session Equipment Utilized During Treatment: Gait belt;Oxygen Activity Tolerance: Treatment limited secondary to medical complications (Comment) (very SOB) Patient left: in chair;with call bell/phone within reach;with family/visitor  present Nurse Communication: Mobility status;Other (comment) (pt c/o nausea after session)   Felecia Shelling  PTA [facility]  Acute  Rehab Pager      [phone]

## 2013-01-07 DEATH — deceased

## 2013-01-08 MED ORDER — SODIUM CHLORIDE 0.9 % IV BOLUS (SEPSIS)
500.0000 mL | Freq: Once | INTRAVENOUS | Status: AC
  Administered 2013-01-08: 14:00:00 500 mL via INTRAVENOUS

## 2013-01-08 NOTE — Progress Notes (Signed)
LATE ENTRY: Pt was seen at 12:30pm [facility] PROGRESS NOTE  Joel Gibson YNW:[id number] DOB: 1931-12-25 DOA: 12/15/2012 PCP: [name], MD Brief Narrative  Joel Gibson is a 79 y.o. 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 Subsequently developed resp distress and was found to have Hydropneumothorax in R lung, then was seen by Pulm and underwent emergent Chest tube placement, with improvement. Was also seen by palliative medicine, now improving, see details below.   Assessment/Plan:  Hydropneumothorax/Post obstructive pnuemonia -greatly appreciate PCCM consult -s/p Emergent chest tube 10/24, nursing had difficulties with Heimlich valve>> discussed with Dr. Delton Coombes and plan now is to place a Pleurex per radiology in a.m>> appreciate pulmonology assistance>> answered patient's wife's questions regarding pleurex -Day 7 of IV Zosyn, will plan to DC after last dose today -Repeat CXR today 10/31 is not enough fluid for pleurex done>> plan per pulmonary plan was to clamp tube and follow serial chest x-rays until there is enough pleural space for pleurex insertion, but pt did not tolerate clamping and CT now back to suction>>Pulm to follow for further recs.  L3 Pathologic burst fracture with some retropulsion of bone and extensive epidural tumor as per MRI -with Right leg weakness/Pain-improving -continue decadron -Rad Onc/Dr Basilio Cairo following, started XRT10/22 for 10treatments total -CT head with vasogenic edema, will continue with decadron q6 PO -PT/OT  Following, SNF recommended -continue MSContin for pain -Appreciate Dr.Goldings assistance, Seen by Dr.Harkins for interventional pain consult >>  followup per Dr. Ollen Bowl today 10/30 and no nerve block recommended at this time.   Hypotension -Likely d/t hypovolemia>> will give NS bolus -check H/H and follow  . Hypercalcemia  -secondary to malignancy/bone mets  -trending down on IVF, steroids   -resolved  . Extensive Metastases/Stage 4 non small cell Lung cancer  - Dr Arbutus Ped and Rad Onc following - prognosis appears rather poor - on Tarceva and getting Palliative XRT -s/p Palliative meeting 10/26, patient and wife now have rescinded DO NOT RESUSCITATE status, and he is a full code -long discussion with pt, and family and family given options of LTAC, snf, vs home with hh services -family decided on home with home health services when hE is discharged -CIR declined pt  . Hypertension  -continue outpt meds   . GERD (gastroesophageal reflux disease)  -continue on PPI   . COPD (chronic obstructive pulmonary disease)  - continue outpt meds, add prn nebs   . Anemia of chronic disease  -stable, monitor   Constipation -Continue Miralax   Global: Given Advanced stage Lung CA with Mets to Brain, spine, liver , progressive disease on CTA chest, s/p Palliative consult for Goals of care>> but pt rescinded DNR status since then and does not want hospice services when dc'ed home.  Code Status: full Family Communication: wife, daughter at bedside  Disposition Plan: likely to home when ok per PULM   Consultants:  Neuro  Radiation -onc  onc   Pulm  Dr.Paul Harkins  Procedures:  Chest tube 10/24  XRT to Lumbar spine  Antibiotics:  Zosyn 10/24>>10/30  HPI/Subjective: Decreased po intake today, and also decreased UOP. Denies N/V Objective: Filed Vitals:   01/08/13 1327  BP: 91/41  Pulse: 103  Temp: 97.5 F (36.4 C)  Resp: 18    Intake/Output Summary (Last 24 hours) at 01/08/13 1355 Last data filed at 01/08/13 1300  Gross per 24 hour  Intake    590 ml  Output    940 ml  Net   -350 ml   Filed Weights    01/01/2013 1712 12/31/12 0400 01/01/13 0400  Weight: 66 kg (145 lb 8.1 oz) 67.7 kg (149 lb 4 oz) 66.7 kg (147 lb 0.8 oz)    Exam:  General: alert & oriented x 3, in NAD Cardiovascular: RRR, nl S1 s2 Respiratory: decreased BS at bases, no wheezes, non labored Abdomen: soft +BS NT/ND, no masses palpable Extremities: No cyanosis and trace-+1 edema, RLE foot strength 3/5 and rest of R.Leg 4-5/5, L side strength 5/5     Data Reviewed: Basic Metabolic Panel:  Recent Labs Lab 01/02/13 0613 01/03/13 0455 01/07/13 0510  NA 136 136 138  K 4.1 4.1 4.0  CL 104 103 107  CO2 22 22 22   GLUCOSE 119* 104* 122*  BUN 30* 32* 31*  CREATININE 1.03 1.14 0.97  CALCIUM 9.8 9.5 9.9   Liver Function Tests: No results found for this basename: AST, ALT, ALKPHOS, BILITOT, PROT, ALBUMIN,  in the last 168 hours No results found for this basename: LIPASE, AMYLASE,  in the last 168 hours No results found for this basename: AMMONIA,  in the last 168 hours CBC:  Recent Labs Lab 01/02/13 0613 01/03/13 0455 01/06/13 0350  WBC 13.0* 10.4 9.5  HGB 8.6* 7.8* 7.6*  HCT 27.3* 24.2* 24.3*  MCV 101.1* 101.7* 102.1*  PLT 202 168 148*   Cardiac Enzymes: No results found for this basename: CKTOTAL, CKMB, CKMBINDEX, TROPONINI,  in the last 168 hours BNP (last 3 results) No results found for this basename: PROBNP,  in the last 8760 hours CBG: No results found for this basename: GLUCAP,  in the last 168 hours  Recent Results (from the past 240 hour(s))  MRSA PCR SCREENING     Status: None   Collection Time    12/30/12  1:16 PM      Result Value Range Status   MRSA by PCR NEGATIVE  NEGATIVE Final   Comment:            The GeneXpert MRSA Assay (FDA     approved for NASAL specimens     only), is one component of a     comprehensive MRSA colonization     surveillance program. It is not     intended to diagnose MRSA     infection nor to guide or     monitor treatment for     MRSA infections.      Studies: Dg Chest Port 1 View  01/07/2013   CLINICAL DATA:  Evaluate pneumothorax.  EXAM: PORTABLE CHEST - 1 VIEW  COMPARISON:  01/06/2013.  FINDINGS: Stable to slightly smaller, small right pneumothorax, located apically. The gas is approximately 1 interspace in thickness. Right peritracheal mass and surrounding airspace disease is stable. Right basilar chest tube in unchanged position. Stable heart size. Clear left lung, although hyperinflated. Remote trauma to the right clavicle and ribs.  IMPRESSION: Stable to slightly smaller right pneumothorax. There is new subcutaneous emphysema around the chest tube.   Electronically Signed   By: Tiburcio Pea M.D.   On: 01/07/2013 06:09    Scheduled Meds: . amLODipine  2.5 mg Oral Daily  . aspirin EC  81 mg Oral QODAY  .  atorvastatin  20 mg Oral q1800  . betaxolol  1 drop Both Eyes Daily  . betaxolol  1 drop Right Eye QHS  . dexamethasone  4 mg Oral Q8H  . dipyridamole-aspirin  1 capsule Oral QHS  . erlotinib  150 mg Oral Daily  . irbesartan  300 mg Oral Daily  . levalbuterol  0.63 mg Nebulization Q6H WA  . loratadine  10 mg Oral Daily  . morphine  30 mg Oral Q12H  . multivitamin with minerals  1 tablet Oral QHS  . pantoprazole  40 mg Oral BID AC  . tiotropium  18 mcg Inhalation Daily   Continuous Infusions:    Principal Problem:   Hydropneumothorax Active Problems:   Hypertension   Lung cancer   COPD (chronic obstructive pulmonary disease)   GERD (gastroesophageal reflux disease)   Bone metastases   Anemia of chronic disease   Right leg weakness   Hypercalcemia   Acute lumbar radiculopathy   Metastasis from malignant tumor of lung   Acute respiratory distress   Encounter for chest tube placement    Time spent: 35    [facility]  [facility] Pager [phone]. If 7PM-7AM, please contact night-coverage at [email or web address], password [facility] 01/08/2013, 1:55 PM  LOS: 13 days

## 2013-01-09 ENCOUNTER — Ambulatory Visit: Payer: Medicare Other

## 2013-01-09 ENCOUNTER — Encounter: Payer: Self-pay | Admitting: Radiation Oncology

## 2013-01-09 DIAGNOSIS — I959 Hypotension, unspecified: Secondary | ICD-10-CM

## 2013-01-09 DIAGNOSIS — K219 Gastro-esophageal reflux disease without esophagitis: Secondary | ICD-10-CM

## 2013-01-09 MED ORDER — MORPHINE BOLUS VIA INFUSION
2.0000 mg | INTRAVENOUS | Status: DC | PRN
  Filled 2013-01-09: qty 4

## 2013-01-09 MED ORDER — LORAZEPAM 2 MG/ML IJ SOLN
1.0000 mg | INTRAMUSCULAR | Status: DC | PRN
  Administered 2013-01-09: 1 mg via INTRAVENOUS
  Filled 2013-01-09 (×3): qty 1

## 2013-01-09 MED ORDER — SCOPOLAMINE 1 MG/3DAYS TD PT72
1.0000 | MEDICATED_PATCH | TRANSDERMAL | Status: DC
  Filled 2013-01-09: qty 1

## 2013-01-09 MED ORDER — SODIUM CHLORIDE 0.9 % IV SOLN
12.5000 mg | INTRAVENOUS | Status: AC
  Administered 2013-01-09: 17:00:00 12.5 mg via INTRAVENOUS
  Filled 2013-01-09: qty 0.5

## 2013-01-09 MED ORDER — MORPHINE BOLUS VIA INFUSION
1.0000 mg | INTRAVENOUS | Status: DC | PRN
  Filled 2013-01-09: qty 1

## 2013-01-09 MED ORDER — ATROPINE SULFATE 1 % OP SOLN
4.0000 [drp] | OPHTHALMIC | Status: DC | PRN
  Filled 2013-01-09: qty 2

## 2013-01-09 MED ORDER — PANTOPRAZOLE SODIUM 40 MG IV SOLR
40.0000 mg | INTRAVENOUS | Status: DC
  Filled 2013-01-09: qty 40

## 2013-01-09 MED ORDER — LORAZEPAM 2 MG/ML IJ SOLN
1.0000 mg | INTRAMUSCULAR | Status: AC
  Administered 2013-01-09: 1 mg via INTRAVENOUS

## 2013-01-09 MED ORDER — MORPHINE SULFATE 2 MG/ML IJ SOLN
2.0000 mg | INTRAMUSCULAR | Status: DC | PRN
  Administered 2013-01-09 (×2): 2 mg via INTRAVENOUS
  Filled 2013-01-09 (×2): qty 1

## 2013-01-09 MED ORDER — ONDANSETRON HCL 4 MG/2ML IJ SOLN: 4.0000 mg | Freq: Four times a day (QID) | INTRAMUSCULAR | Status: DC | PRN

## 2013-01-09 MED ORDER — MORPHINE SULFATE 25 MG/ML IV SOLN
0.5000 mg/h | INTRAVENOUS | Status: DC
  Filled 2013-01-09: qty 10

## 2013-01-09 MED ORDER — MORPHINE SULFATE 10 MG/ML IJ SOLN
3.0000 mg/h | INTRAMUSCULAR | Status: DC
  Filled 2013-01-09: qty 10

## 2013-01-09 MED ORDER — MORPHINE SULFATE 2 MG/ML IJ SOLN: 2.0000 mg | INTRAMUSCULAR | Status: DC | PRN

## 2013-01-09 MED ORDER — MORPHINE SULFATE 2 MG/ML IJ SOLN
1.0000 mg | INTRAMUSCULAR | Status: AC
  Administered 2013-01-09: 17:00:00 1 mg via INTRAVENOUS

## 2013-01-09 MED ORDER — LORAZEPAM 2 MG/ML IJ SOLN
1.0000 mg | INTRAMUSCULAR | Status: AC
  Administered 2013-01-09: 17:00:00 1 mg via INTRAVENOUS

## 2013-01-09 MED ORDER — MORPHINE SULFATE 10 MG/ML IJ SOLN
0.5000 mg/h | INTRAMUSCULAR | Status: DC
  Administered 2013-01-09 (×2): 0.5 mg/h via INTRAVENOUS
  Filled 2013-01-09: qty 10

## 2013-01-09 MED ORDER — MORPHINE BOLUS VIA INFUSION
2.0000 mg | INTRAVENOUS | Status: DC | PRN
  Administered 2013-01-09 (×4): 2 mg via INTRAVENOUS
  Filled 2013-01-09: qty 2

## 2013-01-10 ENCOUNTER — Ambulatory Visit: Payer: Medicare Other

## 2013-01-10 NOTE — Discharge Summary (Addendum)
Death Summary  NICKALOUS STINGLEY ZOX:[id number] DOB: [birth year] DOA: 2013/01/14  PCP: [name], MD   Admit date: 01-14-13 Date of Death: January 28, 2013  Final Diagnoses:  Principal Problem:   Hydropneumothorax Active Problems:   Hypertension   Lung cancer   COPD (chronic obstructive pulmonary disease)   GERD (gastroesophageal reflux disease)   Bone metastases   Anemia of chronic disease   Right leg weakness   Hypercalcemia   Acute lumbar radiculopathy   Metastasis from malignant tumor of lung   Acute respiratory distress   Encounter for chest tube placement      History of present illness:  Joel Gibson is a 79 y.o. male with multiple medical problems as listed below including h/o Lung ca diagnosed in 2013 (and followed by Dr Arbutus Ped) metastatic to brain and and bones and s/p stereotactic radiotherapy (followed by Dr Basilio Cairo), CVA, HTN, AS who presents with the above complaints. He states that he developed pain from his hip area down to knee and R. Leg weakness 3-4days ago and it has worsened to where he is unable to move that leg. Per daughter pt had unwitnessed fall(although wife denies) prior to the worsening of these symptoms and that he has had some weakness for the past 1mos. Pt denies visual changes, dysphagia, slurred speech and no parasthesias reported.He was seen in the ED and head CT showed- stable appearance of left for frontal lobe mass with surrounding vasogenic edema and no new hemorrhage. Neuro was consulted and iv decadron recommended per Dr Roseanne Reno and admission to [facility] for further eval and management   Hospital Course:  Hydropneumothorax/Post obstructive pnuemonia  -While in the hospital pt developed spontaneous R. pneumothorax/ hydropneumothorax and was moved to SDU and CCM/pulmonology was consulted.  -emergentRight wayne cath  CT was placed 10/24>>>per Dr Delford Field -PT was subseqently stabilized and trnsferred back to floor and Pulmonologycontinued to follow  and manage tube and subsequently recommended Heimlich valve placement>> nursing had difficulties with Heimlich valve>> discussed with Dr. Delton Coombes and plan now is to place a Pleurex per radiology >> answered patient's wife's questions regarding pleurex.  -Repeat CXR 10/31 is not enough fluid for pleurex done>> so  The plan per pulmonary plan was to clamp tube and follow serial chest x-rays until there is enough pleural space for pleurex insertion, but pt did not tolerate clamping and CT now back to suction>> discussed with Pulm>>and pt decided on 29-Jan-2023 to be full comfort care, Dr SOOD/pulmonology signed off. -Palliative care/hospice was reconsulted to see pt as per his wishes. Dr Ladona Ridgel saw pt she was started on morphine drip for comfort and ativan as needed and he declined rapidly. On 01/29/2023 with family at bedside and palliative care MD present he expired and was pronounced at 17:39. L3 Pathologic burst fracture with some retropulsion of bone and extensive epidural tumor as per MRI  -On Admission pt was placed on decadron and radiation onc was consulted -with Right leg weakness/Pain-improving -Seen by Dr.Harkins for interventional pain consult >> followup per Dr. Ollen Bowl 10/30 and no nerve block recommended at this time.   -he was maintained on ms contin, and iv narcotics prn  And subsequently was started on drip as above per palliative care  -Rad Onc/Dr Basilio Cairo following, started XRT10/22 for 10treatments total>> on 2023-01-29 pt decided to be full comfort and declined further radiation.  Hypotension  -Likely d/t hypovolemia>> no further IVF pt wishes to be full comfort care  . Hypercalcemia  -secondary to malignancy/bone mets  -improved  on IVF, steroids  . 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 Hypertension  -he was on his outpt meds  . GERD (gastroesophageal reflux disease)  -he was on PPI  . COPD (chronic obstructive pulmonary disease)  - he was paloutpt meds, add prn nebs  . Anemia of chronic disease  -d/t metastatic ca Constipation  -he was placed on Miralax/bowel regimen.        Time:>56mins  Signed:  Kela Millin  [facility] 01/10/2013, 12:13 AM

## 2013-01-11 NOTE — Progress Notes (Signed)
  [facility] Radiation Oncology End of Treatment Note  Name:Joel Gibson  Date: 2013/02/06 ZOX:[id number] DOB:[birth year]    DIAGNOSIS: Bone metastases from NSCLC    INDICATION FOR TREATMENT: Palliative   TREATMENT DATES: 12-28-12 to 01-06-13                         SITE/DOSE:     L2-L5 / 30 Gy in 10 fractions planned (he received 24 Gy in 8 fractions before declining quickly and dying in the hospital)                       BEAMS/ENERGY:    AP/PA   / 15 MV photons               NARRATIVE:   [name] tolerated treatments well, but had a sudden decline in the hospital on 2023-02-07 and died that day.  I sent a card to his wife with my deepest sympathy.                   -----------------------------------  Lonie Peak, MD

## 2013-02-06 NOTE — Progress Notes (Signed)
01/24/2013 1800  Clinical Encounter Type  Visited With Family;Health care provider  Visit Type Follow-up;Spiritual support;Social support;Death  Spiritual Encounters  Spiritual Needs Emotional;Grief support   Followed up with family to offer pastoral presence, prayer, and logistical/liaison assistance with answering family questions.  Family very appreciative of medical and pastoral care.  [address] [place], [place] [phone]

## 2013-02-06 NOTE — Progress Notes (Signed)
PT Cancellation Note  ___Treatment cancelled today due to medical issues with patient which prohibited therapy  ___ Treatment cancelled today due to patient receiving procedure or test   ___ Treatment cancelled today due to patient's refusal to participate   _X_ Treatment cancelled today due to pt declining and family requesting comfort measures.     Felecia Shelling  PTA [facility]  Acute  Rehab Pager      [phone]

## 2013-02-06 NOTE — Progress Notes (Signed)
PULMONARY  / CRITICAL CARE MEDICINE  Name: Joel Gibson MRN: [id number] DOB: 1931-09-16    ADMISSION DATE:  12/12/2012 CONSULTATION DATE:  10/24  REFERRING MD :  Jomarie Longs  PRIMARY SERVICE:   [facility]   CHIEF COMPLAINT:   Hydropneumothorax   BRIEF PATIENT DESCRIPTION:  79 year old male w/ NSCLC stage IV (mets to brain, spine and pelvis. Admitted on 10/21 with new right LE weakness. Found to have new pathological lumbar fracture by MRI. Began palliative XRT on 10/22. Moved to SDU on 10/24 after developing spontaneous right pneumothorax/ hydropneumothorax.   SIGNIFICANT EVENTS / STUDIES:  MR spine 10/20: 1. Pathologic burst fracture at L3 with some retropulsion of bone and extensive epidural tumor extending into the lateral recess and foramina bilaterally, right greater than left.2. Moderate to severe right lateral recess and foraminal stenosis at L3-4.3. Extensive retroperitoneal lymphadenopathy.4. Mild left foraminal narrowing at L3-4. 5. Extra medullary tumor extends into the psoas muscles bilaterally,right greater than left. 6. Heterogeneous cystic lesions are present within the sacrum and right iliac bone. These likely represent metastases as well.7. Multiple cysts are present within the kidneys bilaterally.  CT chest 10/24: 1. Limited evaluation for pulmonary embolism demonstrating no  evidence of central, lobar or proximal segmental sized pulmonary embolism at this time.  2. Progression of disease with marked enlargement of right upper lobe mass, development of right hilar and mediastinal lymphadenopathy in addition to marked left axillary and subpectoral lymphadenopathy, probable retroperitoneal lymphadenopathy, malignant  right pleural effusion with extensive right pleural nodularity, and widespread metastases throughout the liver. 3. Additional findings, as above.  1031 having plueryx catheter placed. LINES / TUBES: Port (chonic) Right wayne cath 10/24>>>10-31 Rt plueryx  10-31>>  CULTURES:   ANTIBIOTICS: Zosyn 10/24>>>  SUBJECTIVE:  Still w LE weakness and pain Heimlich valve was placed but it leaked significant amt of fluid, so placed back to suction   VITAL SIGNS: Temp:  [97.4 F (36.3 C)-97.5 F (36.4 C)] 97.5 F (36.4 C) (11/03 0538) Pulse Rate:  [103-109] 108 (11/03 0538) Resp:  [16-20] 16 (11/03 0538) BP: (91-106)/(41-52) 91/52 mmHg (11/03 0538) SpO2:  [96 %-100 %] 96 % (11/03 0828)  PHYSICAL EXAMINATION: General:  Chronically ill appearing white male, not in acute distress. Neuro:  Awake, alert, oriented , rt le weakness HEENT:  [place], no JVD  Cardiovascular:  rrr Lungs: improved BS, +++airleak on water seal  Abdomen:  Non-tender  Musculoskeletal:  Intact, RLE weak   Recent Labs Lab 01/03/13 0455 01/07/13 0510  NA 136 138  K 4.1 4.0  CL 103 107  CO2 22 22  BUN 32* 31*  CREATININE 1.14 0.97  GLUCOSE 104* 122*    Recent Labs Lab 01/03/13 0455 01/06/13 0350 01/08/13 1430  HGB 7.8* 7.6* 7.3*  HCT 24.2* 24.3* 23.0*  WBC 10.4 9.5  --   PLT 168 148*  --    No results found.  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 Improved with CT placement Post-obstructive PNA COPD Stage IV NSCLC w/ extensive mets to bone Evident mets to pleural space Pt has decided he wants palliative symptom support only. He is refusing food and water. Asks that we treat his comfort only   Plan Continue analgesics, anxiolytics with focus of keeping pt comfortable rec palliative consult No further XRT Consider residential hospice  31-Jan-2013, 8:49 AM [facility]   Reviewed above, and had detailed d/w pt and his family.  They are  all in agreement that focus of care should be on ensuring Mr. Lortie comfort, and providing best quality of time possible for him to spend with his family.  He does not want to die at home, and we discussed option of inpatient  hospice care versus residential hospice care.  Also discussed that pleurx catheter can remain in place to allow intermittent drainage of effusion as needed to minimize dyspnea.  Have discussed plan with Dr. Suanne Marker and bedside RN.  Palliative care team will be reconsulted to further discuss options with pt and his family.  Mr. Guess may also be experiencing depression, and this will likely need to be addressed further by primary team and palliative care team.  PCCM will sign off.  Please call if additional assistance is needed.  Coralyn Helling, MD [facility] Pulmonary/Critical Care 01/15/2013, 10:54 AM Pager:  [phone] After 3pm call: [phone]

## 2013-02-06 NOTE — Progress Notes (Signed)
Patient JW:JXBJYN FLEET HIGHAM      DOB: 24-Nov-1931      WGN:[id number]  Patient in less distress over the course of the last 1hour .  Witnessed arrest .  No further respirations, no further pulse. Time of Death 539 pm.   Notified Dr. Donna Bernard.  Chaplain present and comforting family.   Total time: 330 pm- 540 pm  Yanelli Zapanta L. Ladona Ridgel, MD MBA The Palliative Medicine Team at [facility] Phone: [phone] Pager: [phone]

## 2013-02-06 NOTE — Progress Notes (Signed)
OT Cancellation Note and Sign off  Patient Details Name: Joel Gibson MRN: [id number] DOB: Mar 10, 1931   Cancelled Treatment:    Reason Eval/Treat Not Completed: Other (comment). Spoke to [employer]. Pt is choosing comfort care. Will sign off.    Kaien Pezzullo 02/03/2013, 11:35 AM [name], OTR/L [phone] 01/14/2013

## 2013-02-06 NOTE — Progress Notes (Addendum)
Pt expired at 1739.  Dr. Derenda Mis present and pronounced patient.  Family at bedside.  Marissa Long RN present.

## 2013-02-06 NOTE — Progress Notes (Addendum)
Patient OZ:HYQMVH N Shoberg      DOB: 02-24-1932      QIO:[id number]   Palliative Medicine Team at [facility] Progress Note    Subjective:  Patient lying in bed with some hiccoughs/reflux.  Patient is his pleasant self.  He states thank you for taking care of me .  He continues to endorse pain after receiving his MS Contin and 30 min  And a total of 4 mg of IV morphine.  Reviewed with patient who tells me whatever it takes to keep me comfortable.  Spoke with spouse and son, and daughters.  Currently, patient with significant increase in wet rhonchi on the left base , which is new to my exam of end of the week.  Right chest with tube in place to water seal.   Filed Vitals:   Jan 12, 2013 1355  BP: 97/64  Pulse: 105  Temp: 97.6 F (36.4 C)  Resp: 18   Physical exam:  General: eyes closed but oriented to himself and surroundings. PERRL on best exam , speech clear and goal directed. Told his son thanks for taking care of his car. Chest decreased on right , left with crackles which are new, right sided port, air leak CVS: tachycardic, S1, S2 Abd: soft , mildly distended but not tender,  Ext: warm, Neuro: enjoying his families company, sweet spirits signing to his sweetheart.     Assessment and plan: 79 yr old white male with widely metastatic NSCLC, s/p pneumothorax with failed hiemlich tube placement.  Patient and spouse now resigned that this is the end of their journey .  Patient requests to be made comfortable.  Patient with new crackles left base and continued pain and dyspnea.  At this time he is a reasonable candidate for low dose morphine as he has now required two boluses of morphine for symptom relief.  Will start low and permit conservative boluses and titration to continue to facilitate pain relief and fellowship with his family.   1.  DNR   2.  Pain and Dyspnea:  Start Morphine drip at .5 mg permit boluses and titration of basilar rate to promote comfort.  Stop MS Contin as he  will likely not be able to take pills soon.  Spouse wants no further drainage of tube.  3.  Reflux: switch protonix to IV as he is burping and uncomfortable and add zofran   Will recheck later for symptom adjustments.  Stopped all unnecessary medications.   Time: 215 pm- 300 pm   Juvon Teater L. Ladona Ridgel, MD MBA The Palliative Medicine Team at [facility] Phone: [phone] Pager: [phone]  Addendum:  Would favor not moving patient from hospital unless he stabilizes.  Will reassess in am.  Kyzer Blowe L. Ladona Ridgel, MD MBA The Palliative Medicine Team at [facility] Phone: [phone] Pager: [phone]

## 2013-02-06 NOTE — Progress Notes (Signed)
01/08/2013 1700  Clinical Encounter Type  Visited With Patient and family together;Health care provider (RN Rinaldo Cloud, Dr Derenda Mis)  Visit Type Patient actively dying;Death;Spiritual support;Social support  Referral From Palliative care team  Spiritual Encounters  Spiritual Needs Emotional;Prayer;Grief support  Stress Factors  Family Stress Factors Loss;Major life changes;Exhausted   Began visit as Palliative Care rounding and stayed with family through Joel Gibson's rapid decline and death.  Total time with family >2 hours.  Provided pastoral presence, prayer, encouragement for family to share stories and reflect together (which appeared to be helpful, grounding, and healing), hugs, and empathic listening.  At Joel Gibson's bedside were all the family members he and his wife Joel Gibson desired to be there: their three children, spouses, and a granddaughter.  Family members are very loving, affirming, and supportive of one another.  They are processing how quickly he declined today and have been grateful for RN's and MD's gentle, proactive support through his dying process.  Will check in again with family prior to leaving.    [address] [place], [place] [phone]

## 2013-02-06 NOTE — Progress Notes (Addendum)
LATE ENTRY: Pt was seen at 12:30pm [facility] PROGRESS NOTE  Joel Gibson RUE:[id number] DOB: 1931/07/06 DOA: 12/12/2012 PCP: [name], MD Brief Narrative  Joel Gibson is a 79 y.o. 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 Subsequently developed resp distress and was found to have Hydropneumothorax in R lung, then was seen by Pulm and underwent emergent Chest tube placement, with improvement. Was also seen by palliative medicine, now improving, see details below.   Assessment/Plan:  Hydropneumothorax/Post obstructive pnuemonia -greatly appreciate PCCM consult -s/p Emergent chest tube 10/24, nursing had difficulties with Heimlich valve>> discussed with Dr. Delton Coombes and plan now is to place a Pleurex per radiology in a.m>> appreciate pulmonology assistance>> answered patient's wife's questions regarding pleurex -Day 7 of IV Zosyn, will plan to DC after last dose today -Repeat CXR today 10/31 is not enough fluid for pleurex done>> plan per pulmonary plan was to clamp tube and follow serial chest x-rays until there is enough pleural space for pleurex insertion, but pt did not tolerate clamping and CT now back to suction>> discussed with Pulm>>now full comfort care, Dr Craige Cotta will s/o  L3 Pathologic burst fracture with some retropulsion of bone and extensive epidural tumor as per MRI -with Right leg weakness/Pain-improving -continue decadron -Rad Onc/Dr Basilio Cairo following, started XRT10/22 for 10treatments total -CT head with vasogenic edema, will continue with decadron q6 PO -PT/OT  Following, SNF recommended -continue MSContin for pain -Appreciate Dr.Goldings assistance, Seen by Dr.Harkins for  interventional pain consult >> followup per Dr. Ollen Bowl 10/30 and no nerve block recommended at this time.  -I Reconsulted palliative care as pt wishes to be full comfort, no further radiation and Dr Ladona Ridgel to see this afternoon Hypotension -Likely d/t hypovolemia>> no further IVF pt wishes to be full comfort care -check H/H and follow  . Hypercalcemia  -secondary to malignancy/bone mets  -trending down on IVF, steroids   -resolved  . Extensive Metastases/Stage 4 non small cell Lung cancer  - Dr Arbutus Ped and Rad Onc following - prognosis appears rather poor - on Tarceva and getting Palliative XRT -s/p Palliative meeting 10/26, patient and wife now have rescinded DO NOT RESUSCITATE status, and he is a full code -long discussion with pt, and family and family given options of LTAC, snf, vs home with hh services -family decided on home with home health services when hE is discharged -CIR declined pt  . Hypertension  -continue outpt meds   . GERD (gastroesophageal reflux disease)  -continue on PPI   . COPD (chronic obstructive pulmonary disease)  - continue outpt meds, add prn nebs   . Anemia of chronic disease  -stable, monitor   Constipation -Continue Miralax   Global: Given Advanced stage Lung CA with Mets to Brain, spine, liver , progressive disease on CTA chest, s/p Palliative consult for Goals of care>> but pt rescinded DNR status since then and does not want hospice services when dc'ed home.  Code Status: full Family Communication: wife, daughter, son at bedside  Disposition Plan: likely to home when ok per PULM   Consultants:  Neuro  Radiation -onc  onc   Pulm  Dr.Paul Harkins  Procedures:  Chest tube 10/24  XRT to Lumbar  spine  Antibiotics:  Zosyn 10/24>>10/30  HPI/Subjective: Pt now desires to be full comfort care , family at bedside and supportive Objective: Filed Vitals:   02/02/2013 0538  BP: 91/52  Pulse: 108  Temp: 97.5 F (36.4 C)   Resp: 16    Intake/Output Summary (Last 24 hours) at 01/08/2013 1012 Last data filed at 01/14/2013 0143  Gross per 24 hour  Intake    720 ml  Output    325 ml  Net    395 ml   Filed Weights   2013-01-02 1712 12/31/12 0400 01/01/13 0400  Weight: 66 kg (145 lb 8.1 oz) 67.7 kg (149 lb 4 oz) 66.7 kg (147 lb 0.8 oz)    Exam:  General: alert & oriented x 3, in NAD Cardiovascular: RRR, nl S1 s2 Respiratory: decreased BS at bases, no wheezes, non labored Abdomen: soft +BS NT/ND, no masses palpable Extremities: No cyanosis and trace-+1 edema, RLE foot strength 3/5 and rest of R.Leg 4-5/5, L side strength 5/5     Data Reviewed: Basic Metabolic Panel:  Recent Labs Lab 01/03/13 0455 01/07/13 0510  NA 136 138  K 4.1 4.0  CL 103 107  CO2 22 22  GLUCOSE 104* 122*  BUN 32* 31*  CREATININE 1.14 0.97  CALCIUM 9.5 9.9   Liver Function Tests: No results found for this basename: AST, ALT, ALKPHOS, BILITOT, PROT, ALBUMIN,  in the last 168 hours No results found for this basename: LIPASE, AMYLASE,  in the last 168 hours No results found for this basename: AMMONIA,  in the last 168 hours CBC:  Recent Labs Lab 01/03/13 0455 01/06/13 0350 01/08/13 1430  WBC 10.4 9.5  --   HGB 7.8* 7.6* 7.3*  HCT 24.2* 24.3* 23.0*  MCV 101.7* 102.1*  --   PLT 168 148*  --    Cardiac Enzymes: No results found for this basename: CKTOTAL, CKMB, CKMBINDEX, TROPONINI,  in the last 168 hours BNP (last 3 results) No results found for this basename: PROBNP,  in the last 8760 hours CBG: No results found for this basename: GLUCAP,  in the last 168 hours  Recent Results (from the past 240 hour(s))  MRSA PCR SCREENING     Status: None   Collection Time    12/30/12  1:16 PM      Result Value Range Status   MRSA by PCR NEGATIVE  NEGATIVE Final   Comment:            The GeneXpert MRSA Assay (FDA     approved for NASAL specimens     only), is one component of a     comprehensive MRSA colonization      surveillance program. It is not     intended to diagnose MRSA     infection nor to guide or     monitor treatment for     MRSA infections.     Studies: No results found.  Scheduled Meds: . amLODipine  2.5 mg Oral Daily  . aspirin EC  81 mg Oral QODAY  . atorvastatin  20 mg Oral q1800  . betaxolol  1 drop Both Eyes Daily  . betaxolol  1 drop Right Eye QHS  . dexamethasone  4 mg Oral Q8H  . dipyridamole-aspirin  1 capsule Oral QHS  . erlotinib  150 mg Oral Daily  . irbesartan  300 mg Oral Daily  . levalbuterol  0.63 mg Nebulization Q6H WA  . loratadine  10 mg Oral Daily  .  morphine  30 mg Oral Q12H  . multivitamin with minerals  1 tablet Oral QHS  . pantoprazole  40 mg Oral BID AC  . tiotropium  18 mcg Inhalation Daily   Continuous Infusions: . morphine      Principal Problem:   Hydropneumothorax Active Problems:   Hypertension   Lung cancer   COPD (chronic obstructive pulmonary disease)   GERD (gastroesophageal reflux disease)   Bone metastases   Anemia of chronic disease   Right leg weakness   Hypercalcemia   Acute lumbar radiculopathy   Metastasis from malignant tumor of lung   Acute respiratory distress   Encounter for chest tube placement    Time spent: 35    [facility]  [facility] Pager [phone]. If 7PM-7AM, please contact night-coverage at [email or web address], password [facility] 02/07/13, 10:12 AM  LOS: 14 days

## 2013-02-06 NOTE — Progress Notes (Signed)
Family requested that take the Tarceva to pharmacy to dispose of after pt expired.  Called and spoke with Perlie Gold pharmacist.  Med taken to pharmacy.

## 2013-02-06 NOTE — Progress Notes (Signed)
Patient ZO:XWRUEA LANSON RANDLE      DOB: [birth year]      VWU:[id number]  Patient developed acute decompensation shortly after 3 pm.  Morphine drip had just been started and so medications were titrated rapidly for comfort as he was in acute distress with significant secretions and agitation.  Ativan 3 mg total given and thorazine started to assist in continued gi symptoms and agitation at the end of life.  Currently on 3mg /hr of morphine , bolusing q 15 minutes for tachypnea.  Anticipate death at anytime.   Kesley Gaffey L. Ladona Ridgel, MD MBA The Palliative Medicine Team at [facility] Phone: [phone] Pager: [phone]

## 2013-02-06 DEATH — deceased

## 2013-02-16 ENCOUNTER — Ambulatory Visit: Payer: Medicare Other | Admitting: Radiation Oncology

## 2013-02-20 ENCOUNTER — Other Ambulatory Visit: Payer: Medicare Other

## 2013-02-20 NOTE — Progress Notes (Signed)
  Radiation Oncology         ([phone]) [phone] ________________________________  Name: Joel Gibson MRN: [id number]  Date: 12/28/2012  DOB: Feb 16, 1932  Simulation Verification Note  Status: inpatient  NARRATIVE: The patient was brought to the treatment unit and placed in the planned treatment position. The clinical setup was verified. Then port films were obtained and uploaded to the radiation oncology medical record software.  The treatment beams were carefully compared against the planned radiation fields. The position location and shape of the radiation fields was reviewed. They targeted volume of tissue appears to be appropriately covered by the radiation beams. Organs at risk appear to be excluded as planned.  Based on my personal review, I approved the simulation verification. The patient's treatment will proceed as planned.  -----------------------------------  Lonie Peak, MD

## 2013-10-31 ENCOUNTER — Other Ambulatory Visit: Payer: Self-pay | Admitting: Pharmacist

## 2013-12-30 DIAGNOSIS — Z4682 Encounter for fitting and adjustment of non-vascular catheter: Secondary | ICD-10-CM

## 2013-12-30 HISTORY — DX: Encounter for fitting and adjustment of non-vascular catheter: Z46.82

## 2014-12-23 IMAGING — CT CT HEAD WO/W CM
1 of 2 series · 13 of 30 positions shown, 17 images · IV contrast (OMNIPAQUE)
Comparison: Head CT, 10/07/2012.  Brain MRI, 11/15/2012.

CLINICAL DATA: Lung carcinoma diagnosed in January 2012.
Chemotherapy in progress. Radiation therapy complete. Patient with
bone and brain metastatic disease. Now with dizziness and right leg
numbness and weakness and unsteady gait.

EXAM:
CT HEAD WITHOUT AND WITH CONTRAST
TECHNIQUE: Contiguous axial images were obtained from the base of the skull
through the vertex without and with intravenous contrast
CONTRAST:  100mL OMNIPAQUE IOHEXOL 300 MG/ML  SOLN

[Series 2: head w/o 4.8 h45s · axial · non-contrast · 0.43mm/px · z∈[+1426,+1540]mm · 13 of 30 slices shown, 17 images]
[im 3/30  brain]
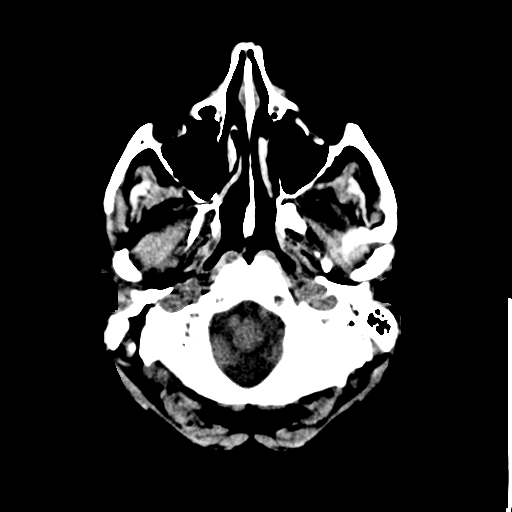
[im 3/30  bone]
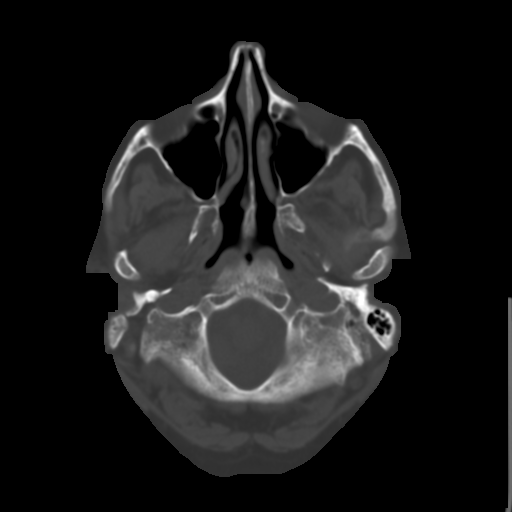
[im 5/30  brain]
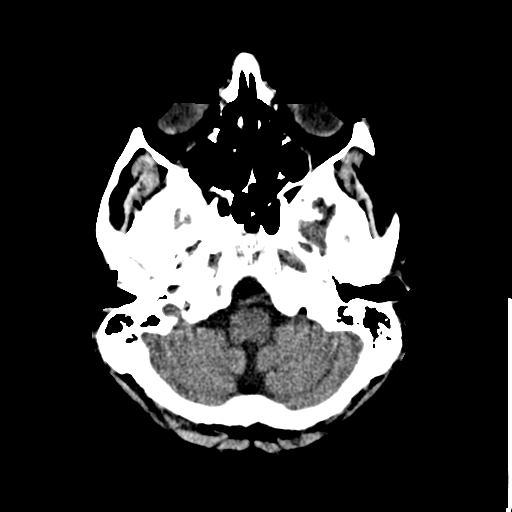
[im 7/30  brain]
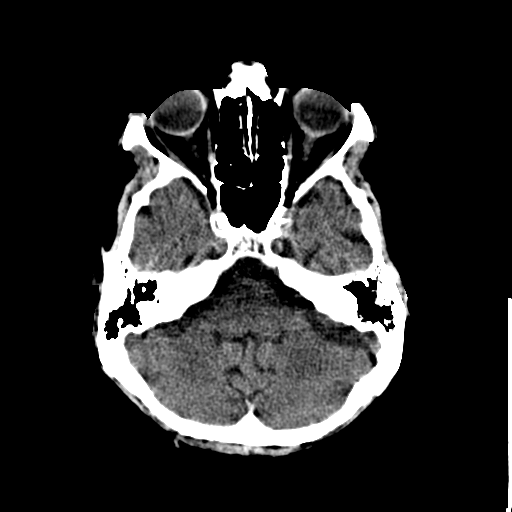
[im 9/30  brain]
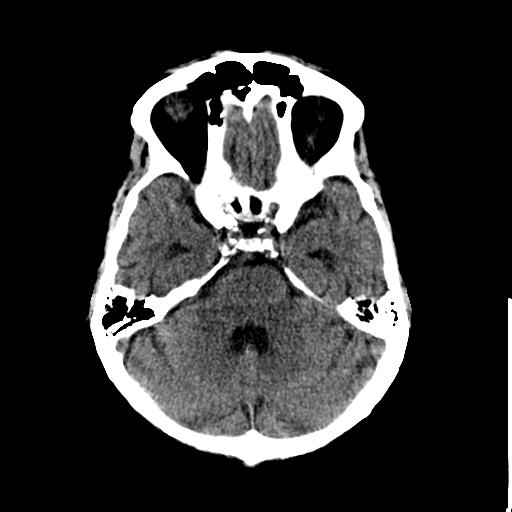
[im 11/30  brain]
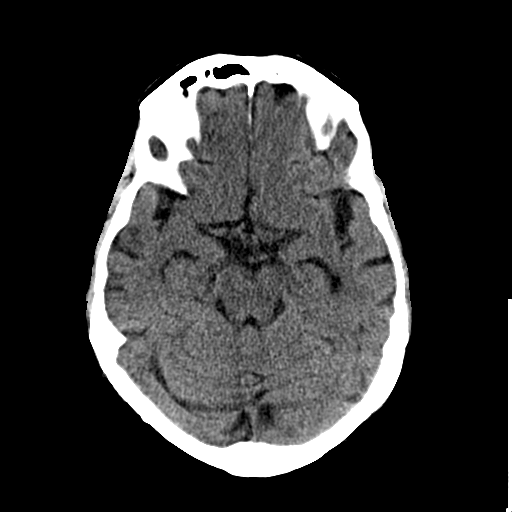
[im 11/30  bone]
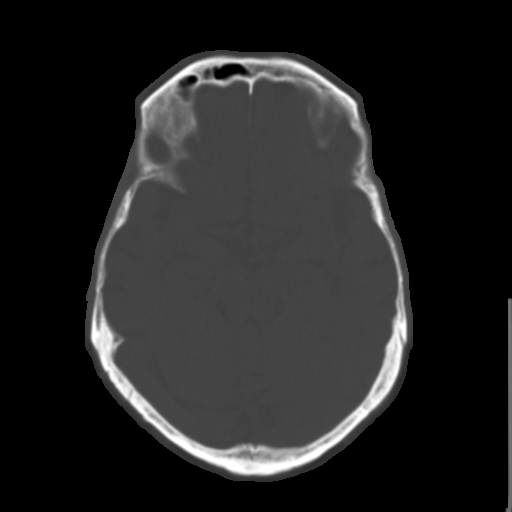
[im 13/30  brain]
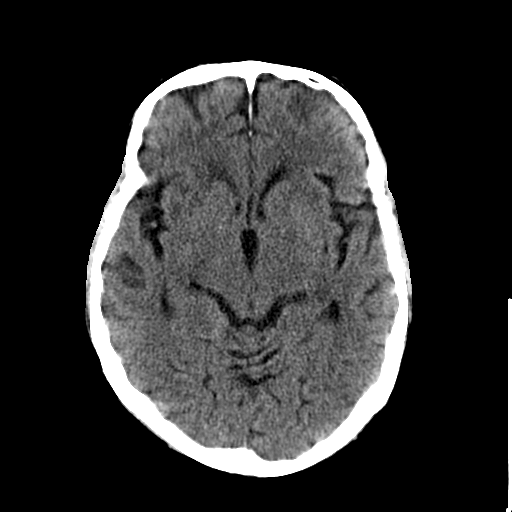
[im 15/30  brain]
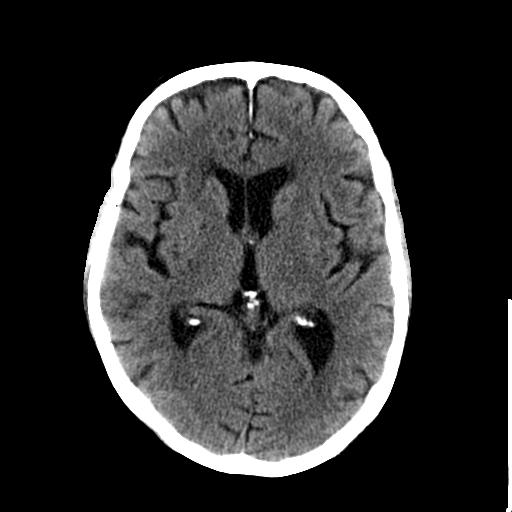
[im 17/30  brain]
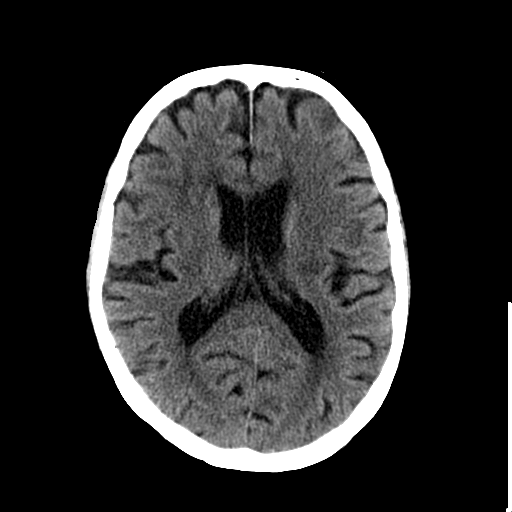
[im 19/30  brain]
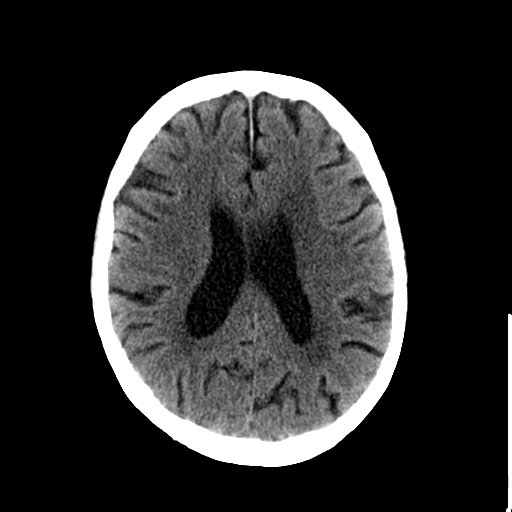
[im 19/30  bone]
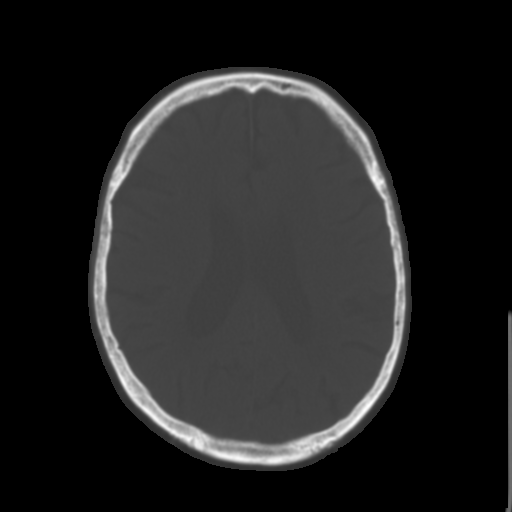
[im 21/30  brain]
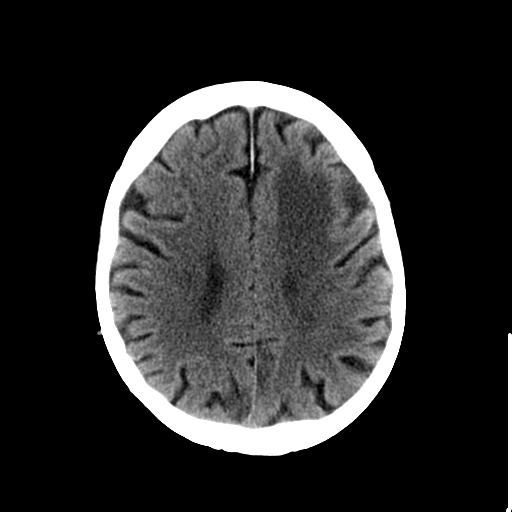
[im 23/30  brain]
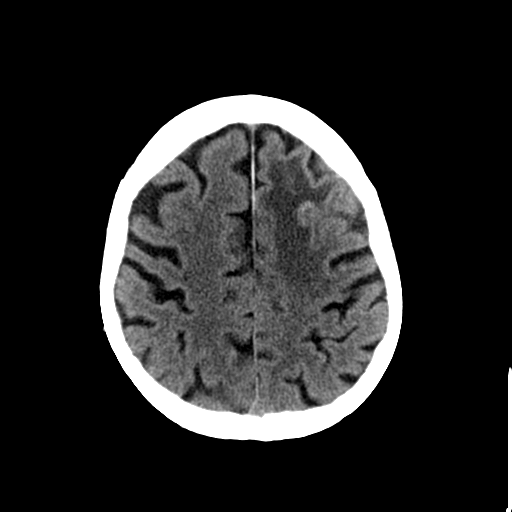
[im 25/30  brain]
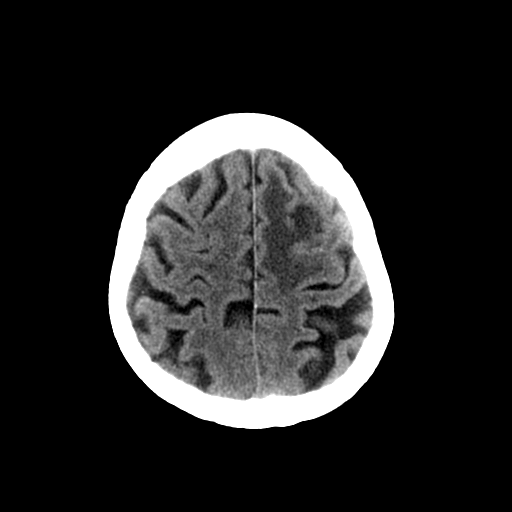
[im 27/30  brain]
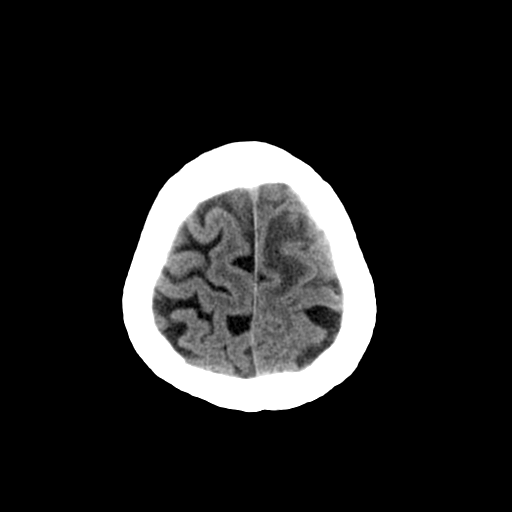
[im 27/30  bone]
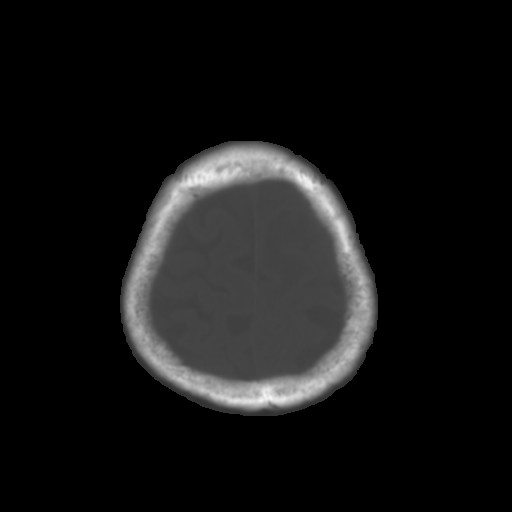

[13 of 30 positions shown; findings below may reference images not displayed]

FINDINGS: The enhancing left frontal lobe metastatic lesion measures 2.3 cm in
greatest transverse dimension, without significant change from the
prior CT. The 3 mm satellite lesion seen below this in the left
frontal lobe white matter is also stable. White matter
hypoattenuation surrounding left frontal lobe lesion consistent with
vasogenic edema shows no significant change.

There are no new parenchymal masses. There are no other areas of
abnormal parenchymal enhancement.

Other patchy areas of white matter hypoattenuation are noted
consistent with a combination of chronic microvascular ischemic
change and old lacunar infarcts. These teres are stable. There is no
evidence of a recent cortical infarct. Ventricles are normal in
configuration. There is ventricle and sulcal enlargement reflecting
age related volume loss.

No extra-axial masses or abnormal fluid collections. There is no
abnormal extra-axial enhancement.

No intracranial hemorrhage.

No skull lesion. The visualized sinuses, mastoid air cells and
middle ear cavities are clear.
IMPRESSION: 1. Stable metastatic lesions in the left frontal lobe as detailed.
Stable associated left frontal lobe vasogenic edema.
2. No new metastatic lesions.
3. No acute findings. No evidence of a recent cortical infarct. No
intracranial hemorrhage.
4. No hydrocephalus. Stable old lacunar infarcts and chronic
microvascular ischemic change.

## 2014-12-23 IMAGING — CT CT PELVIS W/O CM
3 of 4 series · 17 of 46 positions shown, 19 images · non-contrast
Comparison: CT scans dated 10/21/2012 and 07/04/2012

CLINICAL DATA: Metastatic lung cancer. Right leg numbness and
weakness. Unsteady gait. Right hip and lower back pain.

EXAM:
CT PELVIS WITHOUT CONTRAST
TECHNIQUE: Multidetector CT imaging of the pelvis was performed following the
standard protocol without intravenous contrast.

[Series 2: pelvis bone · axial · 0.74mm/px · z∈[+706,+802]mm · 4 of 102 slices shown]
[im 11/102  bone]
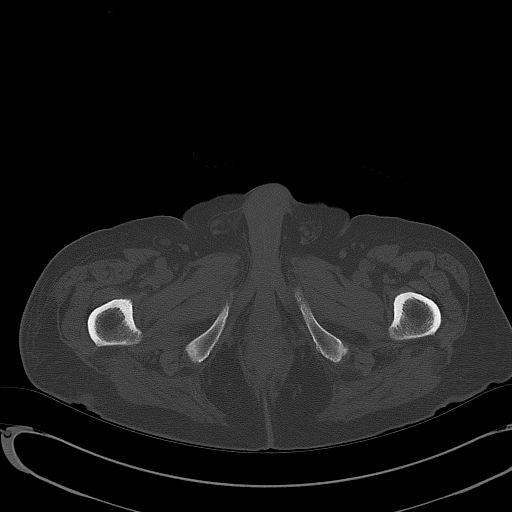
[im 22/102  bone]
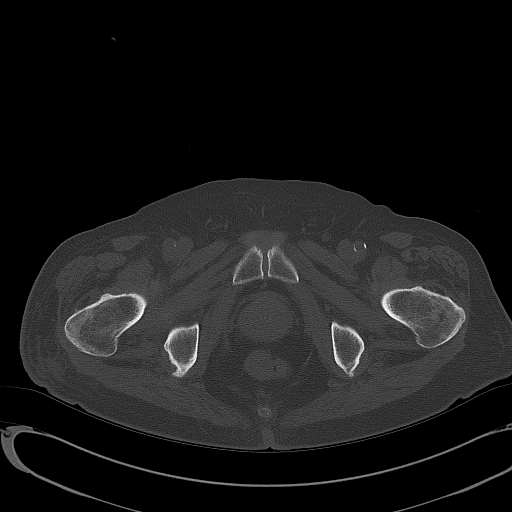
[im 32/102  bone]
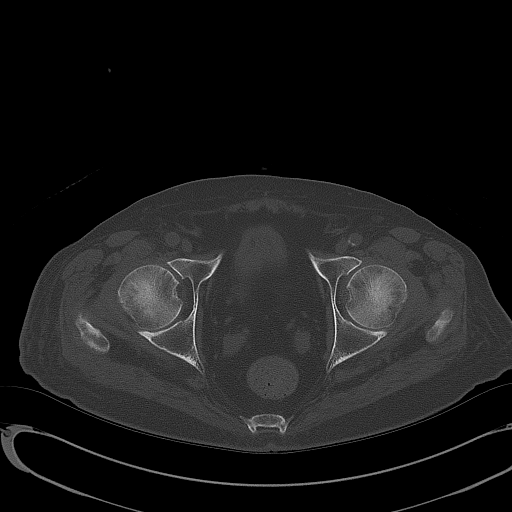
[im 43/102  bone]
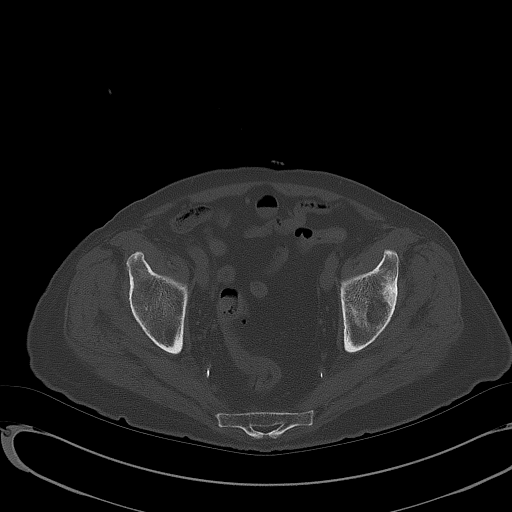

[Series 3: pelvis st · axial · 0.74mm/px · z∈[+698,+948]mm · 10 of 62 slices shown, 12 images]
[im 6/62  soft-tissue]
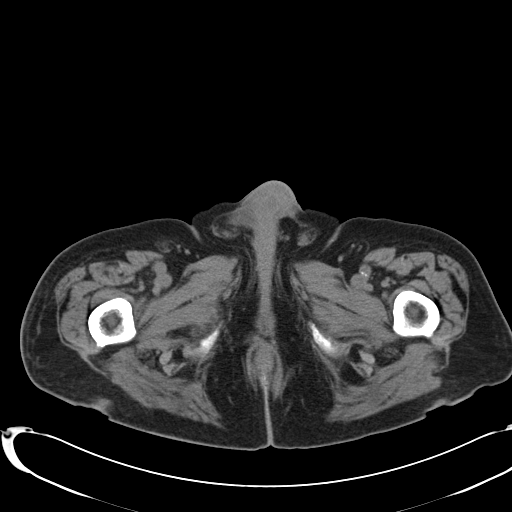
[im 6/62  bone]
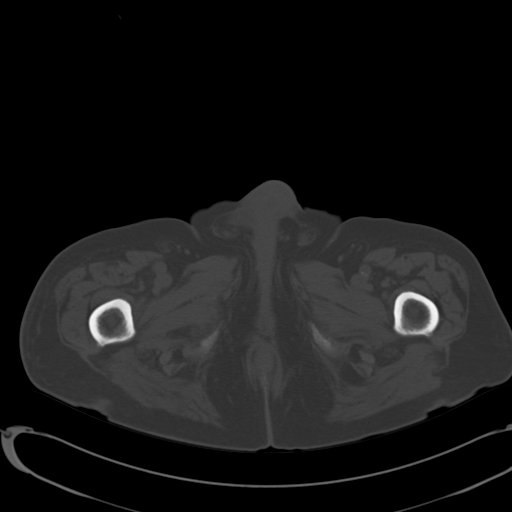
[im 12/62  soft-tissue]
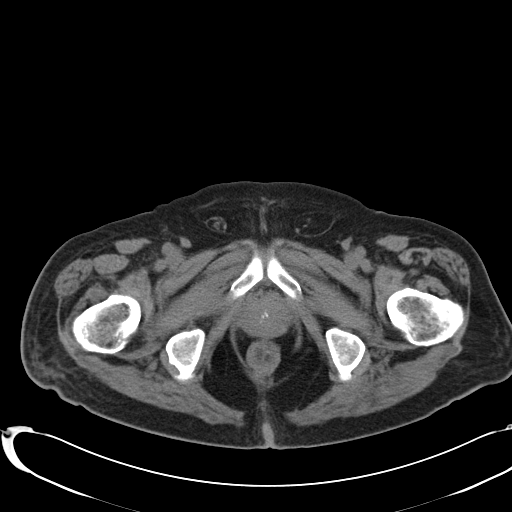
[im 17/62  soft-tissue]
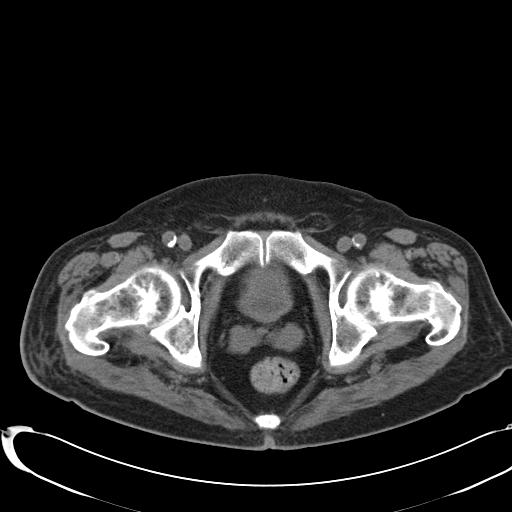
[im 23/62  soft-tissue]
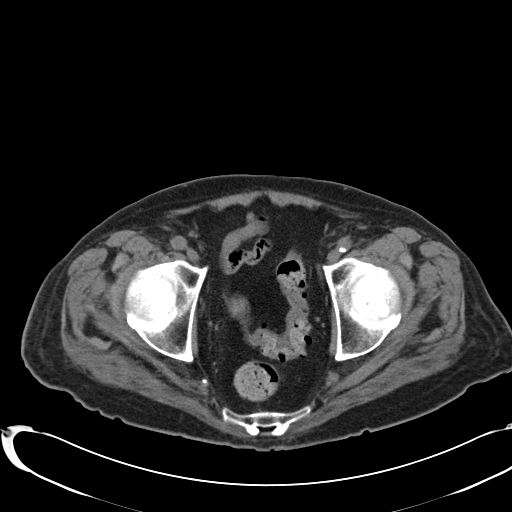
[im 28/62  soft-tissue]
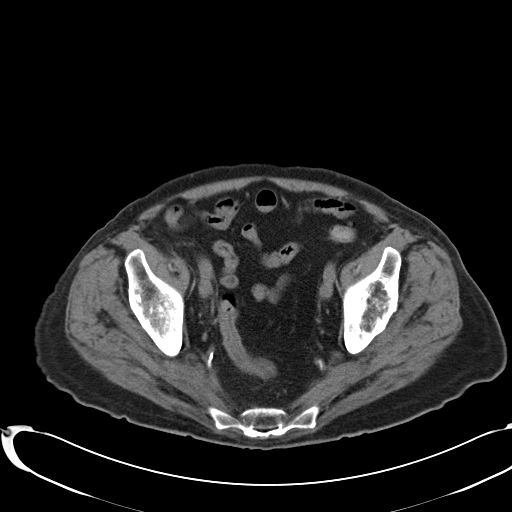
[im 34/62  soft-tissue]
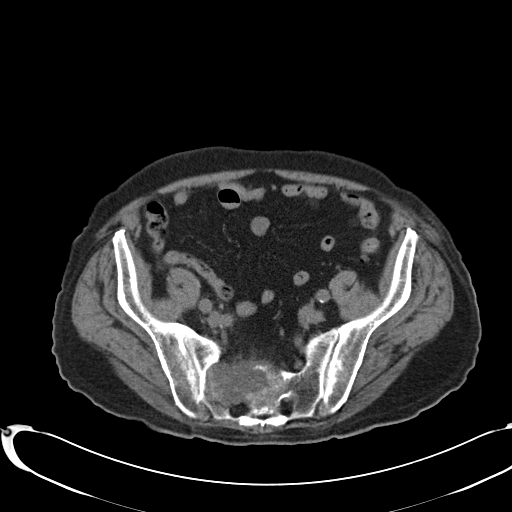
[im 39/62  soft-tissue]
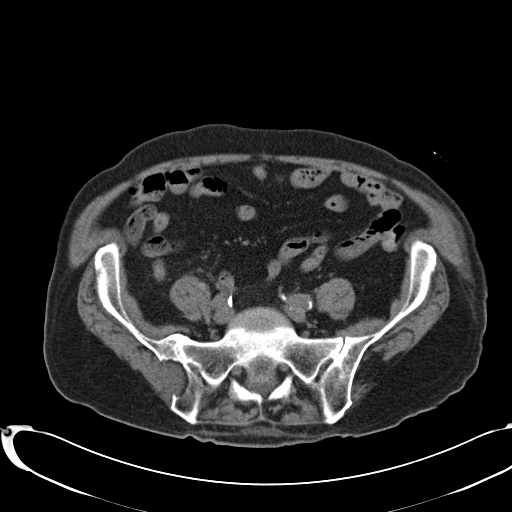
[im 45/62  soft-tissue]
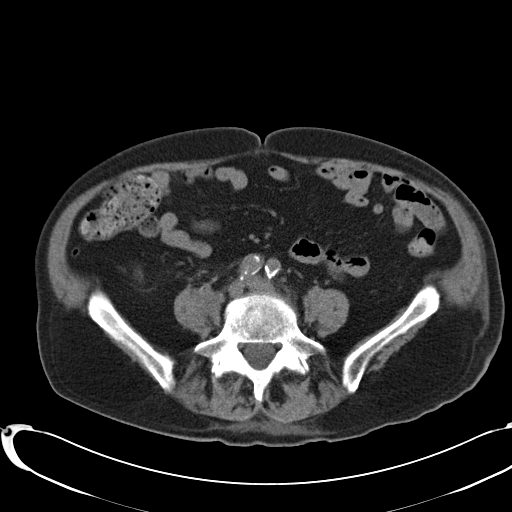
[im 50/62  soft-tissue]
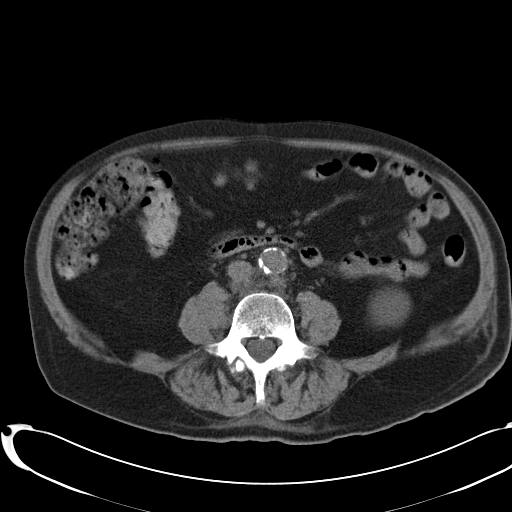
[im 50/62  bone]
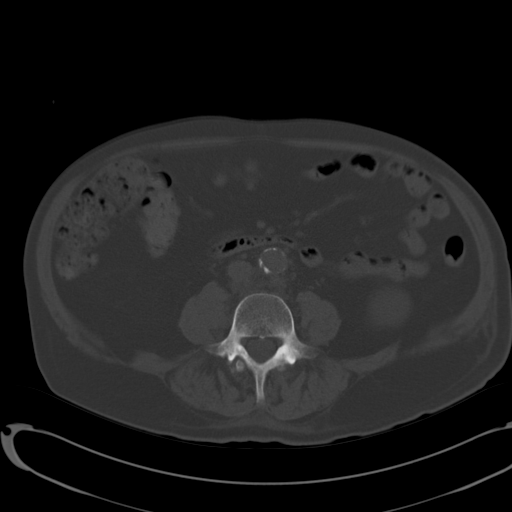
[im 56/62  soft-tissue]
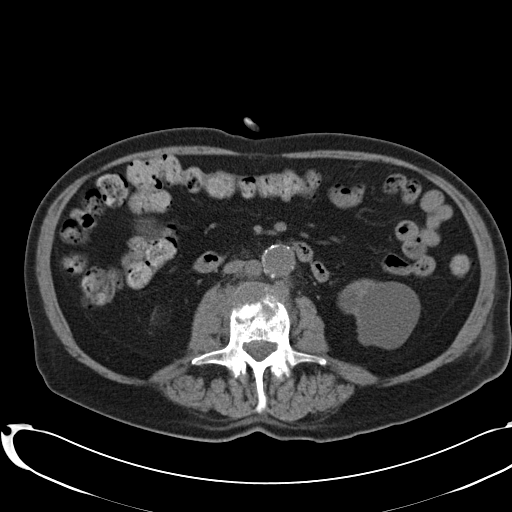

[Series 604: <mpr thick range(2)> · coronal · 0.74mm/px · 3 of 121 slices shown]
[im 41/121  soft-tissue]
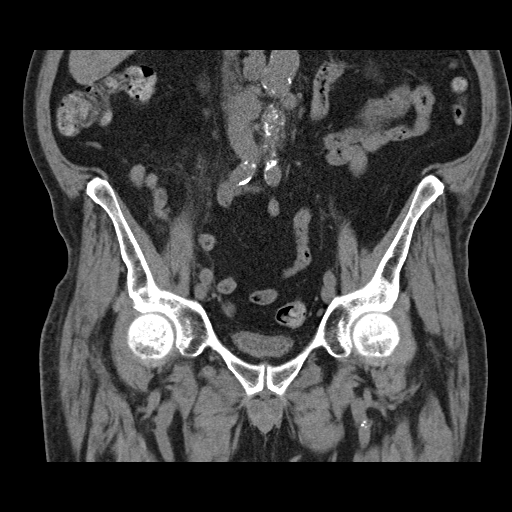
[im 54/121  soft-tissue]
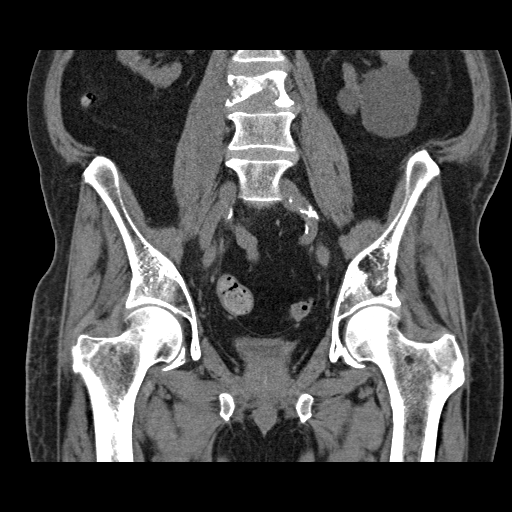
[im 67/121  soft-tissue]
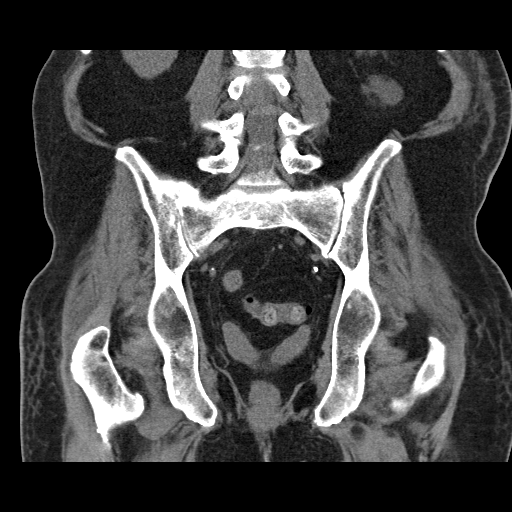

[17 of 46 positions shown; findings below may reference images not displayed]

FINDINGS: There has been no change in the pathologic fracture of L3. There is
new abnormal soft tissue density seen at the level of the right
neural foramen at L3-4. This does not appear to be disk material and
the appearance would be atypical for tumor but it is definitely new.

The extensive metastatic disease in the sacrum extends around the
right S1 nerve and affects the right S2 nerve as well and touches
the sciatic nerve. However, this appears essentially unchanged. The
right iliac lesion is unchanged. There are no new lesions in the
pelvis or hips. No muscle atrophy. There are new. Aortic metastatic
lymph nodes at the L3 and L4 levels.
IMPRESSION: 1. New soft tissue density in the right neural foramen at L3-4 which
should affect the right L3 nerve. The etiology of this soft tissue
is unknown but it is new and given the patient's metastatic disease
it probably represents tumor.
2. New periaortic and retrocaval adenopathy at L3 and L4.
3. No change in the metastatic lesions in the right ilium and in the
sacrum. The sacral lesion does involve the right S1 and S2 nerves
and touches the right sciatic nerve.
